# Patient Record
Sex: Female | Born: 1985 | Race: Black or African American | Hispanic: No | Marital: Married | Smoking: Current some day smoker
Health system: Southern US, Community
[De-identification: ages and names within clinical notes are randomized; demographics above are authoritative.]

## PROBLEM LIST (undated history)

## (undated) ENCOUNTER — Emergency Department (HOSPITAL_COMMUNITY): Payer: MEDICAID | Source: Home / Self Care

## (undated) ENCOUNTER — Inpatient Hospital Stay (HOSPITAL_COMMUNITY): Payer: Self-pay

## (undated) DIAGNOSIS — D219 Benign neoplasm of connective and other soft tissue, unspecified: Secondary | ICD-10-CM

## (undated) DIAGNOSIS — J45909 Unspecified asthma, uncomplicated: Secondary | ICD-10-CM

## (undated) DIAGNOSIS — H9190 Unspecified hearing loss, unspecified ear: Secondary | ICD-10-CM

## (undated) DIAGNOSIS — I10 Essential (primary) hypertension: Secondary | ICD-10-CM

## (undated) DIAGNOSIS — E119 Type 2 diabetes mellitus without complications: Secondary | ICD-10-CM

## (undated) DIAGNOSIS — A491 Streptococcal infection, unspecified site: Secondary | ICD-10-CM

## (undated) DIAGNOSIS — B009 Herpesviral infection, unspecified: Secondary | ICD-10-CM

## (undated) DIAGNOSIS — A5901 Trichomonal vulvovaginitis: Secondary | ICD-10-CM

## (undated) HISTORY — PX: DILATION AND CURETTAGE OF UTERUS: SHX78

## (undated) HISTORY — DX: Essential (primary) hypertension: I10

## (undated) HISTORY — DX: Streptococcal infection, unspecified site: A49.1

## (undated) HISTORY — DX: Herpesviral infection, unspecified: B00.9

## (undated) HISTORY — PX: WISDOM TOOTH EXTRACTION: SHX21

---

## 2012-04-28 DIAGNOSIS — Z6841 Body Mass Index (BMI) 40.0 and over, adult: Secondary | ICD-10-CM | POA: Insufficient documentation

## 2013-02-26 DIAGNOSIS — O021 Missed abortion: Secondary | ICD-10-CM | POA: Insufficient documentation

## 2014-02-12 ENCOUNTER — Emergency Department (HOSPITAL_COMMUNITY)
Admission: EM | Admit: 2014-02-12 | Discharge: 2014-02-12 | Disposition: A | Discharge status: Home / Self Care | Payer: Self-pay | Attending: Emergency Medicine | Admitting: Emergency Medicine

## 2014-02-12 ENCOUNTER — Encounter (HOSPITAL_COMMUNITY): Payer: Self-pay | Admitting: Emergency Medicine

## 2014-02-12 ENCOUNTER — Emergency Department (HOSPITAL_COMMUNITY): Payer: Self-pay

## 2014-02-12 DIAGNOSIS — J45909 Unspecified asthma, uncomplicated: Secondary | ICD-10-CM | POA: Insufficient documentation

## 2014-02-12 DIAGNOSIS — M722 Plantar fascial fibromatosis: Secondary | ICD-10-CM | POA: Insufficient documentation

## 2014-02-12 DIAGNOSIS — F17200 Nicotine dependence, unspecified, uncomplicated: Secondary | ICD-10-CM | POA: Insufficient documentation

## 2014-02-12 HISTORY — DX: Unspecified asthma, uncomplicated: J45.909

## 2014-02-12 MED ORDER — IBUPROFEN 800 MG PO TABS: 800.0000 mg | ORAL_TABLET | Freq: Three times a day (TID) | ORAL | Status: DC

## 2014-02-12 NOTE — ED Notes (Signed)
Pt reports "twisting" left foot 2 months ago and reports pain with ambulation ever since. States she has "constant throbbing" to foot. Denies numbness/tingling. Sensation intact. NAD.

## 2014-02-12 NOTE — Discharge Instructions (Signed)
Plantar Fasciitis (Heel Spur Syndrome) with Rehab The plantar fascia is a fibrous, ligament-like, soft-tissue structure that spans the bottom of the foot. Plantar fasciitis is a condition that causes pain in the foot due to inflammation of the tissue. SYMPTOMS   Pain and tenderness on the underneath side of the foot.  Pain that worsens with standing or walking. CAUSES  Plantar fasciitis is caused by irritation and injury to the plantar fascia on the underneath side of the foot. Common mechanisms of injury include:  Direct trauma to bottom of the foot.  Damage to a small nerve that runs under the foot where the main fascia attaches to the heel bone.  Stress placed on the plantar fascia due to bone spurs. RISK INCREASES WITH:   Activities that place stress on the plantar fascia (running, jumping, pivoting, or cutting).  Poor strength and flexibility.  Improperly fitted shoes.  Tight calf muscles.  Flat feet.  Failure to warm-up properly before activity.  Obesity. PREVENTION  Warm up and stretch properly before activity.  Allow for adequate recovery between workouts.  Maintain physical fitness:  Strength, flexibility, and endurance.  Cardiovascular fitness.  Maintain a health body weight.  Avoid stress on the plantar fascia.  Wear properly fitted shoes, including arch supports for individuals who have flat feet. PROGNOSIS  If treated properly, then the symptoms of plantar fasciitis usually resolve without surgery. However, occasionally surgery is necessary. RELATED COMPLICATIONS   Recurrent symptoms that may result in a chronic condition.  Problems of the lower back that are caused by compensating for the injury, such as limping.  Pain or weakness of the foot during push-off following surgery.  Chronic inflammation, scarring, and partial or complete fascia tear, occurring more often from repeated injections. TREATMENT  Treatment initially involves the use of  ice and medication to help reduce pain and inflammation. The use of strengthening and stretching exercises may help reduce pain with activity, especially stretches of the Achilles tendon. These exercises may be performed at home or with a therapist. Your caregiver may recommend that you use heel cups of arch supports to help reduce stress on the plantar fascia. Occasionally, corticosteroid injections are given to reduce inflammation. If symptoms persist for greater than 6 months despite non-surgical (conservative), then surgery may be recommended.  MEDICATION   If pain medication is necessary, then nonsteroidal anti-inflammatory medications, such as aspirin and ibuprofen, or other minor pain relievers, such as acetaminophen, are often recommended.  Do not take pain medication within 7 days before surgery.  Prescription pain relievers may be given if deemed necessary by your caregiver. Use only as directed and only as much as you need.  Corticosteroid injections may be given by your caregiver. These injections should be reserved for the most serious cases, because they may only be given a certain number of times. HEAT AND COLD  Cold treatment (icing) relieves pain and reduces inflammation. Cold treatment should be applied for 10 to 15 minutes every 2 to 3 hours for inflammation and pain and immediately after any activity that aggravates your symptoms. Use ice packs or massage the area with a piece of ice (ice massage).  Heat treatment may be used prior to performing the stretching and strengthening activities prescribed by your caregiver, physical therapist, or athletic trainer. Use a heat pack or soak the injury in warm water. SEEK IMMEDIATE MEDICAL CARE IF:  Treatment seems to offer no benefit, or the condition worsens.  Any medications produce adverse side effects. EXERCISES RANGE   OF MOTION (ROM) AND STRETCHING EXERCISES - Plantar Fasciitis (Heel Spur Syndrome) These exercises may help you  when beginning to rehabilitate your injury. Your symptoms may resolve with or without further involvement from your physician, physical therapist or athletic trainer. While completing these exercises, remember:   Restoring tissue flexibility helps normal motion to return to the joints. This allows healthier, less painful movement and activity.  An effective stretch should be held for at least 30 seconds.  A stretch should never be painful. You should only feel a gentle lengthening or release in the stretched tissue. RANGE OF MOTION - Toe Extension, Flexion  Sit with your right / left leg crossed over your opposite knee.  Grasp your toes and gently pull them back toward the top of your foot. You should feel a stretch on the bottom of your toes and/or foot.  Hold this stretch for __________ seconds.  Now, gently pull your toes toward the bottom of your foot. You should feel a stretch on the top of your toes and or foot.  Hold this stretch for __________ seconds. Repeat __________ times. Complete this stretch __________ times per day.  RANGE OF MOTION - Ankle Dorsiflexion, Active Assisted  Remove shoes and sit on a chair that is preferably not on a carpeted surface.  Place right / left foot under knee. Extend your opposite leg for support.  Keeping your heel down, slide your right / left foot back toward the chair until you feel a stretch at your ankle or calf. If you do not feel a stretch, slide your bottom forward to the edge of the chair, while still keeping your heel down.  Hold this stretch for __________ seconds. Repeat __________ times. Complete this stretch __________ times per day.  STRETCH - Gastroc, Standing  Place hands on wall.  Extend right / left leg, keeping the front knee somewhat bent.  Slightly point your toes inward on your back foot.  Keeping your right / left heel on the floor and your knee straight, shift your weight toward the wall, not allowing your back to  arch.  You should feel a gentle stretch in the right / left calf. Hold this position for __________ seconds. Repeat __________ times. Complete this stretch __________ times per day. STRETCH - Soleus, Standing  Place hands on wall.  Extend right / left leg, keeping the other knee somewhat bent.  Slightly point your toes inward on your back foot.  Keep your right / left heel on the floor, bend your back knee, and slightly shift your weight over the back leg so that you feel a gentle stretch deep in your back calf.  Hold this position for __________ seconds. Repeat __________ times. Complete this stretch __________ times per day. STRETCH - Gastrocsoleus, Standing  Note: This exercise can place a lot of stress on your foot and ankle. Please complete this exercise only if specifically instructed by your caregiver.   Place the ball of your right / left foot on a step, keeping your other foot firmly on the same step.  Hold on to the wall or a rail for balance.  Slowly lift your other foot, allowing your body weight to press your heel down over the edge of the step.  You should feel a stretch in your right / left calf.  Hold this position for __________ seconds.  Repeat this exercise with a slight bend in your right / left knee. Repeat __________ times. Complete this stretch __________ times per day.    STRENGTHENING EXERCISES - Plantar Fasciitis (Heel Spur Syndrome)  These exercises may help you when beginning to rehabilitate your injury. They may resolve your symptoms with or without further involvement from your physician, physical therapist or athletic trainer. While completing these exercises, remember:   Muscles can gain both the endurance and the strength needed for everyday activities through controlled exercises.  Complete these exercises as instructed by your physician, physical therapist or athletic trainer. Progress the resistance and repetitions only as guided. STRENGTH -  Towel Curls  Sit in a chair positioned on a non-carpeted surface.  Place your foot on a towel, keeping your heel on the floor.  Pull the towel toward your heel by only curling your toes. Keep your heel on the floor.  If instructed by your physician, physical therapist or athletic trainer, add ____________________ at the end of the towel. Repeat __________ times. Complete this exercise __________ times per day. STRENGTH - Ankle Inversion  Secure one end of a rubber exercise band/tubing to a fixed object (table, pole). Loop the other end around your foot just before your toes.  Place your fists between your knees. This will focus your strengthening at your ankle.  Slowly, pull your big toe up and in, making sure the band/tubing is positioned to resist the entire motion.  Hold this position for __________ seconds.  Have your muscles resist the band/tubing as it slowly pulls your foot back to the starting position. Repeat __________ times. Complete this exercises __________ times per day.  Document Released: 07/20/2005 Document Revised: 10/12/2011 Document Reviewed: 11/01/2008 [employer] Patient Information 2015 [employer]. This information is not intended to replace advice given to you by your health care provider. Make sure you discuss any questions you have with your health care provider.  

## 2014-02-12 NOTE — ED Provider Notes (Signed)
CSN: [id number]     Arrival date & time 02/12/14  1610 History  This chart was scribed for non-physician practitioner, Montine Circle, PA-C working with Varney Biles, MD by Frederich Balding, ED scribe. This patient was seen in room TR07C/TR07C and the patient's care was started at 5:54 PM.   Chief Complaint  Patient presents with  . Foot Pain   The history is provided by the patient. No language interpreter was used.   HPI Comments: [name] is a 28 y.o. female who presents to the Emergency Department complaining of continued, constant throbbing left foot pain after injuring it 2 months ago. States she think she twisted while working. Bearing weight worsens the pain. States she has been wearing sandals a lot recently. Pt has taken tylenol, done warm soaks and massages with no relief.   Past Medical History  Diagnosis Date  . Asthma    History reviewed. No pertinent past surgical history. No family history on file. History  Substance Use Topics  . Smoking status: Current Some Day Smoker    Types: Cigarettes  . Smokeless tobacco: Not on file  . Alcohol Use: No   OB History   Grav Para Term Preterm Abortions TAB SAB Ect Mult Living            1     Review of Systems  Constitutional: Negative for fever.  HENT: Negative for congestion.   Eyes: Negative for redness.  Respiratory: Negative for shortness of breath.   Cardiovascular: Negative for chest pain.  Gastrointestinal: Negative for abdominal distention.  Musculoskeletal: Positive for arthralgias.  Skin: Negative for rash.  Neurological: Negative for speech difficulty.  Psychiatric/Behavioral: Negative for confusion.   Allergies  Review of patient's allergies indicates no known allergies.  Home Medications   Prior to Admission medications   Not on File   BP 118/72  Pulse 84  Temp(Src) 98.1 F (36.7 C) (Oral)  Resp 20  Wt 263 lb 9 oz (119.551 kg)  SpO2 98%  LMP 02/05/2014  Physical Exam  Nursing note  and vitals reviewed. Constitutional: She is oriented to person, place, and time. She appears well-developed and well-nourished. No distress.  HENT:  Head: Normocephalic and atraumatic.  Eyes: Conjunctivae and EOM are normal.  Cardiovascular: Normal rate and regular rhythm.   Pulmonary/Chest: Effort normal and breath sounds normal. No stridor. No respiratory distress.  Abdominal: She exhibits no distension.  Musculoskeletal: She exhibits no edema.  Left foot tender to palpation along the plantar fascia. No bony abnormality or deformity. ROM and strength 5/5.  Neurological: She is alert and oriented to person, place, and time. No cranial nerve deficit.  Skin: Skin is warm and dry.  Psychiatric: She has a normal mood and affect.    ED Course  Procedures (including critical care time)  DIAGNOSTIC STUDIES: Oxygen Saturation is 98% on RA, normal by my interpretation.    COORDINATION OF CARE: 5:57 PM-Discussed treatment plan which includes ibuprofen, better arch support and rolling a frozen water bottle with her foot with pt at bedside and pt agreed to plan. Will give pt orthopedic referral and advised her to follow up if symptoms do not started resolving in 2 weeks.   Labs Review Labs Reviewed - No data to display  Imaging Review Dg Foot Complete Left  02/12/2014   CLINICAL DATA:  Left foot pain.  Left ankle pain.  EXAM: LEFT FOOT - COMPLETE 3+ VIEW  COMPARISON:  None.  FINDINGS: Midshaft convexity in the proximal phalanges  of the third and fourth toes is likely incidental or due to old deformity. No significant associated periosteal reaction or other signs that this represents stress fracture.  Lisfranc joint alignment unremarkable. First digit sesamoids normal.  Small Achilles calcaneal spur. The ankle is dorsal flexed during imaging.  IMPRESSION: 1. No acute findings.   Electronically Signed   By: Sherryl Barters M.D.   On: 02/12/2014 17:31     EKG Interpretation None      MDM    Final diagnoses:  Plantar fasciitis     I personally performed the services described in this documentation, which was scribed in my presence. The recorded information has been reviewed and is accurate.  Montine Circle, PA-C 02/12/14 [phone]

## 2014-02-12 NOTE — ED Notes (Signed)
PT reports ankle pain since march 2015. Pt feels the ankle bone is broken.

## 2014-02-14 NOTE — ED Provider Notes (Signed)
Medical screening examination/treatment/procedure(s) were performed by non-physician practitioner and as supervising physician I was immediately available for consultation/collaboration.   EKG Interpretation None       Varney Biles, MD 02/14/14 1232

## 2014-06-05 ENCOUNTER — Encounter (HOSPITAL_COMMUNITY): Payer: Self-pay | Admitting: Physical Medicine and Rehabilitation

## 2014-06-05 ENCOUNTER — Emergency Department (HOSPITAL_COMMUNITY)
Admission: EM | Admit: 2014-06-05 | Discharge: 2014-06-05 | Disposition: A | Discharge status: Home / Self Care | Payer: Self-pay | Attending: Emergency Medicine | Admitting: Emergency Medicine

## 2014-06-05 DIAGNOSIS — Z791 Long term (current) use of non-steroidal anti-inflammatories (NSAID): Secondary | ICD-10-CM | POA: Insufficient documentation

## 2014-06-05 DIAGNOSIS — J45909 Unspecified asthma, uncomplicated: Secondary | ICD-10-CM | POA: Insufficient documentation

## 2014-06-05 DIAGNOSIS — K0889 Other specified disorders of teeth and supporting structures: Secondary | ICD-10-CM

## 2014-06-05 DIAGNOSIS — Z720 Tobacco use: Secondary | ICD-10-CM | POA: Insufficient documentation

## 2014-06-05 DIAGNOSIS — K088 Other specified disorders of teeth and supporting structures: Secondary | ICD-10-CM | POA: Insufficient documentation

## 2014-06-05 MED ORDER — HYDROCODONE-ACETAMINOPHEN 5-325 MG PO TABS: 1.0000 | ORAL_TABLET | ORAL | Status: DC | PRN

## 2014-06-05 MED ORDER — PENICILLIN V POTASSIUM 500 MG PO TABS: 500.0000 mg | ORAL_TABLET | Freq: Three times a day (TID) | ORAL | Status: DC

## 2014-06-05 NOTE — ED Notes (Signed)
Pt presents to department for evaluation of dental pain. States pain to bottom wisdom teeth. Ongoing for several days. No relief with medications at home. 9/10 pain upon arrival to ED. NAD.

## 2014-06-05 NOTE — Discharge Instructions (Signed)

## 2014-06-05 NOTE — ED Provider Notes (Signed)
CSN: [id number]     Arrival date & time 06/05/14  1624 History  This chart was scribed for non-physician practitioner, Charlann Lange, PA-C, working with Hoy Morn, MD, by Delphia Grates, ED Scribe. This patient was seen in room TR06C/TR06C and the patient's care was started at 5:04 PM.    Chief Complaint  Patient presents with  . Dental Pain    Patient is a 28 y.o. female presenting with tooth pain. The history is provided by the patient. No language interpreter was used.  Dental Pain Location:  Lower Duration:  3 days Timing:  Constant Progression:  Unchanged Chronicity:  New Relieved by:  Nothing Ineffective treatments:  Topical anesthetic gel and NSAIDs Associated symptoms: no facial swelling and no fever      HPI Comments: [name] is a 28 y.o. female who presents to the Emergency Department complaining of constant, bilateral lower dental pain that has been ongoing for 3 days. Patient suspects her wisdom teeth are beginning to erupt and believes this to be the cause of her pain. She states she has been unable to eat due to pain. Patient has tried Advil and Orajel without significant relief. She denies facial swelling or fever. Patient is an occasional smoker and not established with a dentist.   Past Medical History  Diagnosis Date  . Asthma    History reviewed. No pertinent past surgical history. No family history on file. History  Substance Use Topics  . Smoking status: Current Some Day Smoker    Types: Cigarettes  . Smokeless tobacco: Not on file  . Alcohol Use: No   OB History    Gravida Para Term Preterm AB TAB SAB Ectopic Multiple Living            1     Review of Systems  Constitutional: Negative for fever.  HENT: Positive for dental problem. Negative for facial swelling.       Allergies  Review of patient's allergies indicates no known allergies.  Home Medications   Prior to Admission medications   Medication Sig Start Date End Date  Taking? Authorizing Provider  ibuprofen (ADVIL,MOTRIN) 800 MG tablet Take 1 tablet (800 mg total) by mouth 3 (three) times daily. 02/12/14   Montine Circle, PA-C   Triage Vitals: BP 138/81 mmHg  Pulse 87  Temp(Src) 99.2 F (37.3 C) (Oral)  Resp 18  Ht 5\' 8"  (1.727 m)  Wt 274 lb (124.286 kg)  BMI 41.67 kg/m2  SpO2 100%  Physical Exam  Constitutional: She is oriented to person, place, and time. She appears well-developed and well-nourished. No distress.  HENT:  Head: Normocephalic and atraumatic.  Generally good dentition with partially erupted rear lower molars. No pointing abscess. Mild right facial swelling.  Eyes: Conjunctivae and EOM are normal.  Neck: No tracheal deviation present.  No palpable cervical adenopathy.  Cardiovascular: Normal rate.   Pulmonary/Chest: Effort normal. No respiratory distress.  Musculoskeletal: Normal range of motion.  Lymphadenopathy:    She has no cervical adenopathy.  Neurological: She is alert and oriented to person, place, and time.  Skin: Skin is warm and dry.  Psychiatric: She has a normal mood and affect. Her behavior is normal.  Nursing note and vitals reviewed.   ED Course  Procedures (including critical care time)  DIAGNOSTIC STUDIES: Oxygen Saturation is 100% on room air, normal by my interpretation.    COORDINATION OF CARE: At 73 Discussed treatment plan with patient which includes ABX and dental referral. Patient agrees.  Labs Review Labs Reviewed - No data to display  Imaging Review No results found.   EKG Interpretation None      MDM   Final diagnoses:  None    1. Dental pain  Recommended dental follow up. No visualized abscess amenable to draining today. Stable for discharge.  I personally performed the services described in this documentation, which was scribed in my presence. The recorded information has been reviewed and is accurate.     Dewaine Oats, PA-C 06/05/14 1753

## 2014-06-21 ENCOUNTER — Emergency Department (HOSPITAL_COMMUNITY): Payer: Self-pay

## 2014-06-21 ENCOUNTER — Encounter (HOSPITAL_COMMUNITY): Payer: Self-pay

## 2014-06-21 ENCOUNTER — Emergency Department (HOSPITAL_COMMUNITY)
Admission: EM | Admit: 2014-06-21 | Discharge: 2014-06-22 | Disposition: A | Discharge status: Home / Self Care | Payer: Self-pay | Attending: Emergency Medicine | Admitting: Emergency Medicine

## 2014-06-21 DIAGNOSIS — N8320 Unspecified ovarian cysts: Secondary | ICD-10-CM | POA: Insufficient documentation

## 2014-06-21 DIAGNOSIS — R102 Pelvic and perineal pain: Secondary | ICD-10-CM

## 2014-06-21 DIAGNOSIS — N83202 Unspecified ovarian cyst, left side: Secondary | ICD-10-CM

## 2014-06-21 DIAGNOSIS — Z3202 Encounter for pregnancy test, result negative: Secondary | ICD-10-CM | POA: Insufficient documentation

## 2014-06-21 DIAGNOSIS — N760 Acute vaginitis: Secondary | ICD-10-CM | POA: Insufficient documentation

## 2014-06-21 DIAGNOSIS — N83201 Unspecified ovarian cyst, right side: Secondary | ICD-10-CM

## 2014-06-21 DIAGNOSIS — D259 Leiomyoma of uterus, unspecified: Secondary | ICD-10-CM | POA: Insufficient documentation

## 2014-06-21 DIAGNOSIS — Z87891 Personal history of nicotine dependence: Secondary | ICD-10-CM | POA: Insufficient documentation

## 2014-06-21 DIAGNOSIS — B9689 Other specified bacterial agents as the cause of diseases classified elsewhere: Secondary | ICD-10-CM

## 2014-06-21 DIAGNOSIS — Z792 Long term (current) use of antibiotics: Secondary | ICD-10-CM | POA: Insufficient documentation

## 2014-06-21 DIAGNOSIS — J45909 Unspecified asthma, uncomplicated: Secondary | ICD-10-CM | POA: Insufficient documentation

## 2014-06-21 LAB — CBC WITH DIFFERENTIAL/PLATELET
Basophils Absolute: 0 10*3/uL (ref 0.0–0.1)
Basophils Relative: 0 % (ref 0–1)
Eosinophils Absolute: 0.1 10*3/uL (ref 0.0–0.7)
Eosinophils Relative: 1 % (ref 0–5)
HCT: 31.2 % — ABNORMAL LOW (ref 36.0–46.0)
Hemoglobin: 10.2 g/dL — ABNORMAL LOW (ref 12.0–15.0)
Lymphocytes Relative: 40 % (ref 12–46)
Lymphs Abs: 2.4 10*3/uL (ref 0.7–4.0)
MCH: 23.7 pg — ABNORMAL LOW (ref 26.0–34.0)
MCHC: 32.7 g/dL (ref 30.0–36.0)
MCV: 72.4 fL — ABNORMAL LOW (ref 78.0–100.0)
Monocytes Absolute: 0.3 10*3/uL (ref 0.1–1.0)
Monocytes Relative: 5 % (ref 3–12)
Neutro Abs: 3.3 10*3/uL (ref 1.7–7.7)
Neutrophils Relative %: 54 % (ref 43–77)
Platelets: 321 10*3/uL (ref 150–400)
RBC: 4.31 MIL/uL (ref 3.87–5.11)
RDW: 14.9 % (ref 11.5–15.5)
WBC: 6.1 10*3/uL (ref 4.0–10.5)

## 2014-06-21 LAB — COMPREHENSIVE METABOLIC PANEL
ALT: 11 U/L (ref 0–35)
AST: 15 U/L (ref 0–37)
Albumin: 3.9 g/dL (ref 3.5–5.2)
Alkaline Phosphatase: 92 U/L (ref 39–117)
Anion gap: 13 (ref 5–15)
BUN: 6 mg/dL (ref 6–23)
CO2: 24 mEq/L (ref 19–32)
Calcium: 9.1 mg/dL (ref 8.4–10.5)
Chloride: 100 mEq/L (ref 96–112)
Creatinine, Ser: 0.79 mg/dL (ref 0.50–1.10)
GFR calc Af Amer: >90 mL/min (ref >90)
GFR calc non Af Amer: >90 mL/min (ref >90)
Glucose, Bld: 89 mg/dL (ref 70–99)
Potassium: 4.2 mEq/L (ref 3.7–5.3)
Sodium: 137 mEq/L (ref 137–147)
Total Bilirubin: <0.2 mg/dL — ABNORMAL LOW (ref 0.3–1.2)
Total Protein: 7.4 g/dL (ref 6.0–8.3)

## 2014-06-21 LAB — URINALYSIS, ROUTINE W REFLEX MICROSCOPIC
Bilirubin Urine: NEGATIVE
Glucose, UA: NEGATIVE mg/dL
Hgb urine dipstick: NEGATIVE
Ketones, ur: NEGATIVE mg/dL
Leukocytes, UA: NEGATIVE
Nitrite: NEGATIVE
Protein, ur: NEGATIVE mg/dL
Specific Gravity, Urine: 1.008 (ref 1.005–1.030)
Urobilinogen, UA: 0.2 mg/dL (ref 0.0–1.0)
pH: 5.5 (ref 5.0–8.0)

## 2014-06-21 LAB — WET PREP, GENITAL
Trich, Wet Prep: NONE SEEN
Yeast Wet Prep HPF POC: NONE SEEN

## 2014-06-21 LAB — PREGNANCY, URINE: Preg Test, Ur: NEGATIVE

## 2014-06-21 NOTE — ED Notes (Signed)
Pelvic cart set up, pt undressed from waist down.

## 2014-06-21 NOTE — ED Notes (Signed)
Pt c/o "pain in ovaries" - denies any difficulty urinating, N/V/D. Pt states pain alternates sides.

## 2014-06-21 NOTE — ED Notes (Signed)
Pt remains in ultrasound.

## 2014-06-21 NOTE — ED Notes (Signed)
Pt going to ultrasound with a nursing assistant at this time.

## 2014-06-21 NOTE — ED Provider Notes (Signed)
CSN: [id number]     Arrival date & time 06/21/14  1642 History   First MD Initiated Contact with Patient 06/21/14 1957     Chief Complaint  Patient presents with  . Abdominal Pain     (Consider location/radiation/quality/duration/timing/severity/associated sxs/prior Treatment) The history is provided by the patient. No language interpreter was used.  [name] is a 28 y/o F with PMHx of asthma presenting to the ED with pelvic pain that has been ongoing intermittently for the past couple of months, starting up in May of 2015 - reported that she thinks it is her ovaries. Patient reported that the pain is localized to the pelvic region bilaterally described as a throbbing, aching pain that is intermittent - reported that it is episodic lasting for a couple of days - reported that at times she has intermittent sharp pain. Stated that at times the pain radiates to her lower back. Patient reported that she has been using ASA, Tylenol, Ibuprofen with minimal relief. Reported that for the past few days she has noticed vaginal discharge of a thick clear consistency. Reported that she is sexually active, does not use protection - with one partner x 2 years. Patient reported that her LMP was 06/05/2014. Denied fever, chills, vomiting, diarrhea, melena, hematochezia, dysuria, hematuria, vaginal pain, neck pain, neck stiffness. Denied being on birth control. PCP [facility]  Past Medical History  Diagnosis Date  . Asthma    History reviewed. No pertinent past surgical history. No family history on file. History  Substance Use Topics  . Smoking status: Former Smoker    Types: Cigarettes  . Smokeless tobacco: Not on file  . Alcohol Use: No   OB History    Gravida Para Term Preterm AB TAB SAB Ectopic Multiple Living            1     Review of Systems  Constitutional: Negative for fever and chills.  Respiratory: Negative for chest tightness and shortness of breath.    Cardiovascular: Negative for chest pain.  Gastrointestinal: Positive for nausea. Negative for vomiting, abdominal pain, diarrhea, constipation, blood in stool and anal bleeding.  Genitourinary: Positive for vaginal discharge and pelvic pain. Negative for dysuria, hematuria, decreased urine volume, vaginal bleeding and vaginal pain.  Musculoskeletal: Positive for back pain. Negative for neck pain and neck stiffness.  Neurological: Negative for dizziness, weakness, numbness and headaches.      Allergies  Review of patient's allergies indicates no known allergies.  Home Medications   Prior to Admission medications   Medication Sig Start Date End Date Taking? Authorizing Provider  HYDROcodone-acetaminophen (NORCO/VICODIN) 5-325 MG per tablet Take 1-2 tablets by mouth every 4 (four) hours as needed. 06/05/14  Yes Shari A Upstill, PA-C  penicillin v potassium (VEETID) 500 MG tablet Take 1 tablet (500 mg total) by mouth 3 (three) times daily. 06/05/14  Yes Shari A Upstill, PA-C  ibuprofen (ADVIL,MOTRIN) 400 MG tablet Take 1 tablet (400 mg total) by mouth every 6 (six) hours as needed. 06/22/14   Warren Kugelman, PA-C  metroNIDAZOLE (FLAGYL) 500 MG tablet Take 1 tablet (500 mg total) by mouth 2 (two) times daily. 06/22/14   Delani Kohli, PA-C   BP 113/63 mmHg  Pulse 75  Temp(Src) 98.3 F (36.8 C) (Oral)  Resp 20  Ht 5\' 8"  (1.727 m)  Wt 274 lb (124.286 kg)  BMI 41.67 kg/m2  SpO2 100%  LMP 06/05/2014 Physical Exam  Constitutional: She is oriented to person, place, and time.  She appears well-developed and well-nourished. No distress.  HENT:  Head: Normocephalic and atraumatic.  Mouth/Throat: Oropharynx is clear and moist. No oropharyngeal exudate.  Eyes: Conjunctivae are normal. Pupils are equal, round, and reactive to light. Right eye exhibits no discharge. Left eye exhibits no discharge.  Neck: Normal range of motion. Neck supple. No tracheal deviation present.  Cardiovascular: Normal  rate, regular rhythm and normal heart sounds.  Exam reveals no friction rub.   No murmur heard. Pulmonary/Chest: Effort normal and breath sounds normal. No respiratory distress. She has no wheezes. She has no rales.  Abdominal: Soft. Bowel sounds are normal. She exhibits no distension. There is no tenderness. There is no rebound and no guarding.  Obese BS normoactive in all 4 quadrants Abdomen soft upon palpation  Negative pain upon palpation  Negative peritoneal signs   Genitourinary: No labial fusion. There is no rash, tenderness, lesion or injury on the right labia. There is no rash, tenderness, lesion or injury on the left labia. Uterus is not deviated, not enlarged, not fixed and not tender. Cervix exhibits no motion tenderness and no friability. Right adnexum displays tenderness. Right adnexum displays no mass and no fullness. Left adnexum displays tenderness. Left adnexum displays no mass and no fullness. No erythema, tenderness or bleeding in the vagina. No foreign body around the vagina. No signs of injury around the vagina. Vaginal discharge found.  Pelvic exam: negative swelling, erythema, inflammation, lesions, sores, deformities noted to the external genitalia. Negative masses, polyps, lesions, sores noted to the vaginal canal. Negative blood in the vaginal vault. Thick white discharge with odor noted on exam. Negative CMT. Mild bilateral adnexal tenderness noted.  Exam chaperoned with nurse, Becki  Musculoskeletal: Normal range of motion.  Full ROM to upper and lower extremities without difficulty noted, negative ataxia noted.  Lymphadenopathy:    She has no cervical adenopathy.  Neurological: She is alert and oriented to person, place, and time. No cranial nerve deficit. She exhibits normal muscle tone. Coordination normal.  Skin: Skin is warm and dry. No rash noted. She is not diaphoretic. No erythema.  Psychiatric: She has a normal mood and affect. Her behavior is normal. Thought  content normal.  Nursing note and vitals reviewed.   ED Course  Procedures (including critical care time)  Results for orders placed or performed during the hospital encounter of 06/21/14  Wet prep, genital  Result Value Ref Range   Yeast Wet Prep HPF POC NONE SEEN NONE SEEN   Trich, Wet Prep NONE SEEN NONE SEEN   Clue Cells Wet Prep HPF POC FEW (A) NONE SEEN   WBC, Wet Prep HPF POC FEW (A) NONE SEEN  CBC with Differential  Result Value Ref Range   WBC 6.1 4.0 - 10.5 K/uL   RBC 4.31 3.87 - 5.11 MIL/uL   Hemoglobin 10.2 (L) 12.0 - 15.0 g/dL   HCT 31.2 (L) 36.0 - 46.0 %   MCV 72.4 (L) 78.0 - 100.0 fL   MCH 23.7 (L) 26.0 - 34.0 pg   MCHC 32.7 30.0 - 36.0 g/dL   RDW 14.9 11.5 - 15.5 %   Platelets 321 150 - 400 K/uL   Neutrophils Relative % 54 43 - 77 %   Neutro Abs 3.3 1.7 - 7.7 K/uL   Lymphocytes Relative 40 12 - 46 %   Lymphs Abs 2.4 0.7 - 4.0 K/uL   Monocytes Relative 5 3 - 12 %   Monocytes Absolute 0.3 0.1 - 1.0 K/uL  Eosinophils Relative 1 0 - 5 %   Eosinophils Absolute 0.1 0.0 - 0.7 K/uL   Basophils Relative 0 0 - 1 %   Basophils Absolute 0.0 0.0 - 0.1 K/uL  Comprehensive metabolic panel  Result Value Ref Range   Sodium 137 137 - 147 mEq/L   Potassium 4.2 3.7 - 5.3 mEq/L   Chloride 100 96 - 112 mEq/L   CO2 24 19 - 32 mEq/L   Glucose, Bld 89 70 - 99 mg/dL   BUN 6 6 - 23 mg/dL   Creatinine, Ser 0.79 0.50 - 1.10 mg/dL   Calcium 9.1 8.4 - 10.5 mg/dL   Total Protein 7.4 6.0 - 8.3 g/dL   Albumin 3.9 3.5 - 5.2 g/dL   AST 15 0 - 37 U/L   ALT 11 0 - 35 U/L   Alkaline Phosphatase 92 39 - 117 U/L   Total Bilirubin <0.2 (L) 0.3 - 1.2 mg/dL   GFR calc non Af Amer >90 >90 mL/min   GFR calc Af Amer >90 >90 mL/min   Anion gap 13 5 - 15  Pregnancy, urine  Result Value Ref Range   Preg Test, Ur NEGATIVE NEGATIVE  Urinalysis, Routine w reflex microscopic  Result Value Ref Range   Color, Urine YELLOW YELLOW   APPearance CLEAR CLEAR   Specific Gravity, Urine 1.008 1.005 -  1.030   pH 5.5 5.0 - 8.0   Glucose, UA NEGATIVE NEGATIVE mg/dL   Hgb urine dipstick NEGATIVE NEGATIVE   Bilirubin Urine NEGATIVE NEGATIVE   Ketones, ur NEGATIVE NEGATIVE mg/dL   Protein, ur NEGATIVE NEGATIVE mg/dL   Urobilinogen, UA 0.2 0.0 - 1.0 mg/dL   Nitrite NEGATIVE NEGATIVE   Leukocytes, UA NEGATIVE NEGATIVE    Labs Review Labs Reviewed  WET PREP, GENITAL - Abnormal; Notable for the following:    Clue Cells Wet Prep HPF POC FEW (*)    WBC, Wet Prep HPF POC FEW (*)    All other components within normal limits  CBC WITH DIFFERENTIAL - Abnormal; Notable for the following:    Hemoglobin 10.2 (*)    HCT 31.2 (*)    MCV 72.4 (*)    MCH 23.7 (*)    All other components within normal limits  COMPREHENSIVE METABOLIC PANEL - Abnormal; Notable for the following:    Total Bilirubin <0.2 (*)    All other components within normal limits  GC/CHLAMYDIA PROBE AMP  PREGNANCY, URINE  URINALYSIS, ROUTINE W REFLEX MICROSCOPIC  HIV ANTIBODY (ROUTINE TESTING)    Imaging Review US Transvaginal Non-ob  06/22/2014   CLINICAL DATA:  Pelvic pain  EXAM: TRANSABDOMINAL AND TRANSVAGINAL ULTRASOUND OF PELVIS  DOPPLER ULTRASOUND OF OVARIES  TECHNIQUE: Both transabdominal and transvaginal ultrasound examinations of the pelvis were performed. Transabdominal technique was performed for global imaging of the pelvis including uterus, ovaries, adnexal regions, and pelvic cul-de-sac.  It was necessary to proceed with endovaginal exam following the transabdominal exam to visualize the ovaries. Color and duplex Doppler ultrasound was utilized to evaluate blood flow to the ovaries.  COMPARISON:  None.  FINDINGS: Uterus  Measurements: 9.1 x 5.1 x 6.0 cm. Echogenic areas are noted within the myometrium consist with uterine fibroids. One measures 2.2 cm lying anteriorly. A second more posteriorly oriented measures 1.7 cm.  Endometrium  Thickness: Prominent at 14 mm. This may be related to the patient's menstrual status.   Right ovary  Measurements: 4.6 x 2.5 x 2.9 cm. Normal appearance/no adnexal mass.  Left ovary  Measurements:  4.2 x 2.8 x 2.9 cm. A 1.7 cm cystic structure is noted within the ovary with some internal debris likely representing hemorrhagic cyst. Follicular changes are noted  Pulsed Doppler evaluation of both ovaries demonstrates normal low-resistance arterial and venous waveforms.  Other findings  Small amount of free fluid is noted.  IMPRESSION: Cystic structure within the left ovary with some internal debris which may represent a hemorrhagic cyst or possibly an involuting cyst given the free fluid.  Uterine fibroids   Electronically Signed   By: Inez Catalina M.D.   On: 06/22/2014 00:26   US Pelvis Complete  06/22/2014   CLINICAL DATA:  Pelvic pain  EXAM: TRANSABDOMINAL AND TRANSVAGINAL ULTRASOUND OF PELVIS  DOPPLER ULTRASOUND OF OVARIES  TECHNIQUE: Both transabdominal and transvaginal ultrasound examinations of the pelvis were performed. Transabdominal technique was performed for global imaging of the pelvis including uterus, ovaries, adnexal regions, and pelvic cul-de-sac.  It was necessary to proceed with endovaginal exam following the transabdominal exam to visualize the ovaries. Color and duplex Doppler ultrasound was utilized to evaluate blood flow to the ovaries.  COMPARISON:  None.  FINDINGS: Uterus  Measurements: 9.1 x 5.1 x 6.0 cm. Echogenic areas are noted within the myometrium consist with uterine fibroids. One measures 2.2 cm lying anteriorly. A second more posteriorly oriented measures 1.7 cm.  Endometrium  Thickness: Prominent at 14 mm. This may be related to the patient's menstrual status.  Right ovary  Measurements: 4.6 x 2.5 x 2.9 cm. Normal appearance/no adnexal mass.  Left ovary  Measurements: 4.2 x 2.8 x 2.9 cm. A 1.7 cm cystic structure is noted within the ovary with some internal debris likely representing hemorrhagic cyst. Follicular changes are noted  Pulsed Doppler evaluation of both  ovaries demonstrates normal low-resistance arterial and venous waveforms.  Other findings  Small amount of free fluid is noted.  IMPRESSION: Cystic structure within the left ovary with some internal debris which may represent a hemorrhagic cyst or possibly an involuting cyst given the free fluid.  Uterine fibroids   Electronically Signed   By: Inez Catalina M.D.   On: 06/22/2014 00:26   [place] Art/ven Flow Abd Pelv Doppler  06/22/2014   CLINICAL DATA:  Pelvic pain  EXAM: TRANSABDOMINAL AND TRANSVAGINAL ULTRASOUND OF PELVIS  DOPPLER ULTRASOUND OF OVARIES  TECHNIQUE: Both transabdominal and transvaginal ultrasound examinations of the pelvis were performed. Transabdominal technique was performed for global imaging of the pelvis including uterus, ovaries, adnexal regions, and pelvic cul-de-sac.  It was necessary to proceed with endovaginal exam following the transabdominal exam to visualize the ovaries. Color and duplex Doppler ultrasound was utilized to evaluate blood flow to the ovaries.  COMPARISON:  None.  FINDINGS: Uterus  Measurements: 9.1 x 5.1 x 6.0 cm. Echogenic areas are noted within the myometrium consist with uterine fibroids. One measures 2.2 cm lying anteriorly. A second more posteriorly oriented measures 1.7 cm.  Endometrium  Thickness: Prominent at 14 mm. This may be related to the patient's menstrual status.  Right ovary  Measurements: 4.6 x 2.5 x 2.9 cm. Normal appearance/no adnexal mass.  Left ovary  Measurements: 4.2 x 2.8 x 2.9 cm. A 1.7 cm cystic structure is noted within the ovary with some internal debris likely representing hemorrhagic cyst. Follicular changes are noted  Pulsed Doppler evaluation of both ovaries demonstrates normal low-resistance arterial and venous waveforms.  Other findings  Small amount of free fluid is noted.  IMPRESSION: Cystic structure within the left ovary with some internal debris which  may represent a hemorrhagic cyst or possibly an involuting cyst given the free fluid.   Uterine fibroids   Electronically Signed   By: Inez Catalina M.D.   On: 06/22/2014 00:26     EKG Interpretation None      MDM   Final diagnoses:  Pelvic pain in female  Cysts of both ovaries  BV (bacterial vaginosis)  Uterine leiomyoma, unspecified location    Medications  azithromycin (ZITHROMAX) tablet 1,000 mg (1,000 mg Oral Given 06/22/14 0059)  cefTRIAXone (ROCEPHIN) injection 250 mg (250 mg Intramuscular Given 06/22/14 0059)  lidocaine (PF) (XYLOCAINE) 1 % injection 1 mL (1 mL Other Given 06/22/14 0059)    Filed Vitals:   06/21/14 2300 06/22/14 0013 06/22/14 0030 06/22/14 0101  BP: 114/77 105/58 96/60 113/63  Pulse:  85 84 75  Temp:      TempSrc:      Resp: 17 13 15 20   Height:      Weight:      SpO2:  99% 100% 100%   CBC unremarkable-negative elevated white blood cell count. Hemoglobin 10.2, hematocrit 31.2-no previous labs to compare the patient is anemic. CMP unremarkable. Urine pregnancy negative. Urinalysis negative nitrites, leukocytes. Negative hemoglobin or ketones noted in urine. Wet prep noted few clue cells with few white blood cells. GC Chlamydia probe and HIV pending. Pelvic ultrasound noted cystic structure within left ovary with some internal debris which may represent hemorrhagic cyst or possibly an involuting cyst given the free fluid-uterine fibroids identified. Negative findings of molar pregnancy or ectopic pregnancy. Doubt TOA. Doubt ovarian torsion. Not concerned for PID. Suspicion to be ovarian cysts, particularly on the left resulting in patient's pain. Patient has vaginal discharge with clue cells noted on wet prep - will treat patient for BV. Patient stable, afebrile. Patient not septic appearing. Discharge patient. Discharged patient with ibuprofen and Flagyl. Referred patient to PCP and OBGYN. Discussed with patient to rest and stay hydrated. Discussed with patient to avoid any sexual activity and for patient to follow-up regarding partner to be  tested. Discussed with patient safe sex habits. Discussed with patient to not use alcohol with Flagyl. Discussed with patient to closely monitor symptoms and if symptoms are to worsen or change to report back to the ED - strict return instructions given.  Patient agreed to plan of care, understood, all questions answered.   Jamse Mead, PA-C 06/22/14 0147  Wandra Arthurs, MD 06/22/14 [id number]

## 2014-06-21 NOTE — ED Notes (Signed)
Pt states she is here for lower bilateral abd pain since may. Reports clear discharge. No odd vaginal bleeding. Denies any urinary sx. Wants to know the cause of the pain. Reports nausea.

## 2014-06-22 LAB — HIV ANTIBODY (ROUTINE TESTING W REFLEX): HIV 1&2 Ab, 4th Generation: NONREACTIVE

## 2014-06-22 MED ORDER — METRONIDAZOLE 500 MG PO TABS: 500.0000 mg | ORAL_TABLET | Freq: Two times a day (BID) | ORAL | Status: DC

## 2014-06-22 MED ORDER — IBUPROFEN 400 MG PO TABS: 400.0000 mg | ORAL_TABLET | Freq: Four times a day (QID) | ORAL | Status: DC | PRN

## 2014-06-22 MED ORDER — CEFTRIAXONE SODIUM 250 MG IJ SOLR
250.0000 mg | Freq: Once | INTRAMUSCULAR | Status: AC
  Administered 2014-06-22: 250 mg via INTRAMUSCULAR
  Filled 2014-06-22: qty 250

## 2014-06-22 MED ORDER — LIDOCAINE HCL (PF) 1 % IJ SOLN
1.0000 mL | Freq: Once | INTRAMUSCULAR | Status: AC
  Administered 2014-06-22: 1 mL
  Filled 2014-06-22: qty 5

## 2014-06-22 MED ORDER — AZITHROMYCIN 250 MG PO TABS
1000.0000 mg | ORAL_TABLET | Freq: Once | ORAL | Status: AC
  Administered 2014-06-22: 1000 mg via ORAL
  Filled 2014-06-22: qty 4

## 2014-06-22 NOTE — Discharge Instructions (Signed)
Please call your doctor for a followup appointment within 24-48 hours. When you talk to your doctor please let them know that you were seen in the emergency department and have them acquire all of your records so that they can discuss the findings with you and formulate a treatment plan to fully care for your new and ongoing problems. Please call and set up an appointment with your primary care provider to be seen and reassessed Please call and set up an appointment with OB/GYN, [facility] to be seen and reassessed regarding ovarian cyst on the left ovary measuring approximately 1.7 cm today-this will need to be closely monitored. Please have repeat ultrasound for ovarian cyst and uterine fibroids to be monitored Please take medications as prescribed and on a full stomach There is to be no drinking alcohol with Flagyl Always practice safe sex habits such as using a condom Please have partner(s) over a six-month time period be reassessed and checked for any STDs Please continue to monitor symptoms closely and if symptoms are to worsen or change (fever greater than 101, chills, sweating, nausea, vomiting, chest pain, shortness of breathe, difficulty breathing, weakness, numbness, tingling, worsening or changes to pain pattern, changes to vaginal discharge, abdominal pain, blood in the stools, black tarry stools, worsening pain, inability to keep food or fluids down, abnormal vaginal bleeding) please report back to the Emergency Department immediately.   Uterine Fibroid A uterine fibroid is a growth (tumor) that occurs in your uterus. This type of tumor is not cancerous and does not spread out of the uterus. You can have one or many fibroids. Fibroids can vary in size, weight, and where they grow in the uterus. Some can become quite large. Most fibroids do not require medical treatment, but some can cause pain or heavy bleeding during and between periods. CAUSES  A fibroid is the result of a single  uterine cell that keeps growing (unregulated), which is different than most cells in the human body. Most cells have a control mechanism that keeps them from reproducing without control.  SIGNS AND SYMPTOMS   Bleeding.  Pelvic pain and pressure.  Bladder problems due to the size of the fibroid.  Infertility and miscarriages depending on the size and location of the fibroid. DIAGNOSIS  Uterine fibroids are diagnosed through a physical exam. Your health care provider may feel the lumpy tumors during a pelvic exam. Ultrasonography may be done to get information regarding size, location, and number of tumors.  TREATMENT   Your health care provider may recommend watchful waiting. This involves getting the fibroid checked by your health care provider to see if it grows or shrinks.   Hormone treatment or an intrauterine device (IUD) may be prescribed.   Surgery may be needed to remove the fibroids (myomectomy) or the uterus (hysterectomy). This depends on your situation. When fibroids interfere with fertility and a woman wants to become pregnant, a health care provider may recommend having the fibroids removed.  [place] care depends on how you were treated. In general:   Keep all follow-up appointments with your health care provider.   Only take over-the-counter or prescription medicines as directed by your health care provider. If you were prescribed a hormone treatment, take the hormone medicines exactly as directed. Do not take aspirin. It can cause bleeding.   Talk to your health care provider about taking iron pills.  If your periods are troublesome but not so heavy, lie down with your feet  raised slightly above your heart. Place cold packs on your lower abdomen.   If your periods are heavy, write down the number of pads or tampons you use per month. Bring this information to your health care provider.   Include green vegetables in your diet.  SEEK  IMMEDIATE MEDICAL CARE IF:  You have pelvic pain or cramps not controlled with medicines.   You have a sudden increase in pelvic pain.   You have an increase in bleeding between and during periods.   You have excessive periods and soak tampons or pads in a half hour or less.  You feel lightheaded or have fainting episodes. Document Released: 07/17/2000 Document Revised: 05/10/2013 Document Reviewed: 02/16/2013 [facility] Patient Information 2015 [place], [place]. This information is not intended to replace advice given to you by your health care provider. Make sure you discuss any questions you have with your health care provider. Ovarian Cyst An ovarian cyst is a fluid-filled sac that forms on an ovary. The ovaries are small organs that produce eggs in women. Various types of cysts can form on the ovaries. Most are not cancerous. Many do not cause problems, and they often go away on their own. Some may cause symptoms and require treatment. Common types of ovarian cysts include:  Functional cysts--These cysts may occur every month during the menstrual cycle. This is normal. The cysts usually go away with the next menstrual cycle if the woman does not get pregnant. Usually, there are no symptoms with a functional cyst.  Endometrioma cysts--These cysts form from the tissue that lines the uterus. They are also called "chocolate cysts" because they become filled with blood that turns brown. This type of cyst can cause pain in the lower abdomen during intercourse and with your menstrual period.  Cystadenoma cysts--This type develops from the cells on the outside of the ovary. These cysts can get very big and cause lower abdomen pain and pain with intercourse. This type of cyst can twist on itself, cut off its blood supply, and cause severe pain. It can also easily rupture and cause a lot of pain.  Dermoid cysts--This type of cyst is sometimes found in both ovaries. These cysts may contain different  kinds of body tissue, such as skin, teeth, hair, or cartilage. They usually do not cause symptoms unless they get very big.  Theca lutein cysts--These cysts occur when too much of a certain hormone (human chorionic gonadotropin) is produced and overstimulates the ovaries to produce an egg. This is most common after procedures used to assist with the conception of a baby (in vitro fertilization). CAUSES   Fertility drugs can cause a condition in which multiple large cysts are formed on the ovaries. This is called ovarian hyperstimulation syndrome.  A condition called polycystic ovary syndrome can cause hormonal imbalances that can lead to nonfunctional ovarian cysts. SIGNS AND SYMPTOMS  Many ovarian cysts do not cause symptoms. If symptoms are present, they may include:  Pelvic pain or pressure.  Pain in the lower abdomen.  Pain during sexual intercourse.  Increasing girth (swelling) of the abdomen.  Abnormal menstrual periods.  Increasing pain with menstrual periods.  Stopping having menstrual periods without being pregnant. DIAGNOSIS  These cysts are commonly found during a routine or annual pelvic exam. Tests may be ordered to find out more about the cyst. These tests may include:  Ultrasound.  X-ray of the pelvis.  CT scan.  MRI.  Blood tests. TREATMENT  Many ovarian cysts go away on  their own without treatment. Your health care provider may want to check your cyst regularly for 2-3 months to see if it changes. For women in menopause, it is particularly important to monitor a cyst closely because of the higher rate of ovarian cancer in menopausal women. When treatment is needed, it may include any of the following:  A procedure to drain the cyst (aspiration). This may be done using a long needle and ultrasound. It can also be done through a laparoscopic procedure. This involves using a thin, lighted tube with a tiny camera on the end (laparoscope) inserted through a small  incision.  Surgery to remove the whole cyst. This may be done using laparoscopic surgery or an open surgery involving a larger incision in the lower abdomen.  Hormone treatment or birth control pills. These methods are sometimes used to help dissolve a cyst. HOME CARE INSTRUCTIONS   Only take over-the-counter or prescription medicines as directed by your health care provider.  Follow up with your health care provider as directed.  Get regular pelvic exams and Pap tests. SEEK MEDICAL CARE IF:   Your periods are late, irregular, or painful, or they stop.  Your pelvic pain or abdominal pain does not go away.  Your abdomen becomes larger or swollen.  You have pressure on your bladder or trouble emptying your bladder completely.  You have pain during sexual intercourse.  You have feelings of fullness, pressure, or discomfort in your stomach.  You lose weight for no apparent reason.  You feel generally ill.  You become constipated.  You lose your appetite.  You develop acne.  You have an increase in body and facial hair.  You are gaining weight, without changing your exercise and eating habits.  You think you are pregnant. SEEK IMMEDIATE MEDICAL CARE IF:   You have increasing abdominal pain.  You feel sick to your stomach (nauseous), and you throw up (vomit).  You develop a fever that comes on suddenly.  You have abdominal pain during a bowel movement.  Your menstrual periods become heavier than usual. MAKE SURE YOU:  Understand these instructions.  Will watch your condition.  Will get help right away if you are not doing well or get worse. Document Released: 07/20/2005 Document Revised: 07/25/2013 Document Reviewed: 03/27/2013 [facility] Patient Information 2015 [place], [place]. This information is not intended to replace advice given to you by your health care provider. Make sure you discuss any questions you have with your health care provider.   Emergency  Department Resource Guide 1) Find a Doctor and Pay Out of Pocket Although you won't have to find out who is covered by your insurance plan, it is a good idea to ask around and get recommendations. You will then need to call the office and see if the doctor you have chosen will accept you as a new patient and what types of options they offer for patients who are self-pay. Some doctors offer discounts or will set up payment plans for their patients who do not have insurance, but you will need to ask so you aren't surprised when you get to your appointment.  2) Contact Your Local Health Department Not all health departments have doctors that can see patients for sick visits, but many do, so it is worth a call to see if yours does. If you don't know where your local health department is, you can check in your phone book. The CDC also has a tool to help you locate your state's  health department, and many state websites also have listings of all of their local health departments.  3) Find a [facility] If your illness is not likely to be very severe or complicated, you may want to try a walk in clinic. These are popping up all over the country in pharmacies, drugstores, and shopping centers. They're usually staffed by nurse practitioners or physician assistants that have been trained to treat common illnesses and complaints. They're usually fairly quick and inexpensive. However, if you have serious medical issues or chronic medical problems, these are probably not your best option.  No Primary Care Doctor: - Call Health Connect at  [phone] - they can help you locate a primary care doctor that  accepts your insurance, provides certain services, etc. - Physician Referral Service- [phone]  Chronic Pain Problems: Organization         Address  Phone   Notes  [facility]  [phone] Patients need to be referred by their primary care doctor.   Medication  Assistance: Organization         Address  Phone   Notes  [facility] Medication [facility] [address] [place], [place] [address] [phone] --Must be a resident of [facility] -- Must have NO insurance coverage whatsoever (no Medicaid/ Medicare, etc.) -- The pt. MUST have a primary care doctor that directs their care regularly and follows them in the community   [facility]ist  [phone]   [employer]  [phone]    Agencies that provide inexpensive medical care: Organization         Address  Phone   Notes  [place]  [phone]   [facility]    [phone]   [facility] [place], [place] [address] [phone]   [place] [address] [place] [phone]   Planned Parenthood    [phone]   [facility]    [phone]   [place] and [place] [address], [place] Phone:  [phone], Fax:  [phone] Hours of Operation:  9 am - 6 pm, M-F.  Also accepts Medicaid/Medicare and self-pay.  [facility] for [place], [address], [place] Phone: [phone], Fax: [phone]. Hours of Operation:  8:30 am - 5:30 pm, M-F.  Also accepts Medicaid and self-pay.  [facility] [address], [place] Phone: [phone]   [place], [place], [place] [phone], Ext. [phone] Mondays & Thursdays: 7-9 AM.  First 15 patients are seen on a first come, first serve basis.    [place] Providers:  Organization         Address  Phone   Notes  [facility] [address], [place] [phone] Also accepts self-pay patients.  [facility] [address] [place], [place]  [phone]   [place], [address], [place]  [phone]   [facility] [address], [place] [phone]   Lucianne Lei [address], [place]   [phone] Only accepts [place] Access [place] patients after they have their name applied to their card.   Self-Pay (no insurance) in [facility] [place] [facility]:  Organization  Address  Phone   Notes  Sickle Cell Patients, [facility] Internal [facility] [place] [phone]   [facility] Urgent Care [place] [phone]   [facility] [place]  [place], [address], [place] [phone]   [facility]/Dr. Osei-Bonsu  [address] [place] or [address], [place] [phone] Phone number for both [place] and [place] locations is the same.  Urgent Medical and [facility] [address], [place] [phone]   [facility] [address] [place] or [address] [phone]   [facility] [address], [place] [phone], phone; [phone], fax Sees patients 1st and 3rd Saturday of every month.  Must not qualify for public or private insurance (i.e. Medicaid, Medicare, [place] Health Choice, Veterans' Benefits)  Household income should be no more than 200% of the poverty level The clinic cannot treat you if you are pregnant or think you are pregnant  Sexually transmitted diseases are not treated at the clinic.    Dental Care: Organization         Address  Phone  Notes  [facility] Department of [facility] [phone] Accepts children up to age 35 who are enrolled in [place] or [place]; pregnant women with a Medicaid card; and children who have applied for Medicaid or [place] Health Choice, but were declined, whose parents can pay a reduced fee at time of service.  [facility]  Department of [facility]  [address], [place] [phone] Accepts children up to age 12 who are enrolled in [place] or [place]; pregnant women with a Medicaid card; and children who have applied for Medicaid or [place] Health Choice, but were declined, whose parents can pay a reduced fee at time of service.  [place] Adult Dental Access PROGRAM  [place] [phone] Patients are seen by appointment only. Walk-ins are not accepted. [place] will see patients 30 years of age and older. Monday - Tuesday (8am-5pm) Most Wednesdays (8:30-5pm) $30 per visit, cash only  [facility] Adult [facility] Access PROGRAM  [address], [facility] [phone] Patients are seen by appointment only. Walk-ins are not accepted. [place] will see patients 23 years of age and older. One Wednesday Evening (Monthly: Volunteer Based).  $30 per visit, cash only  [place]  [phone] for adults; Children under age 60, call Graduate Pediatric Dentistry at [phone]. Children aged 55-14, please call [phone] to request a pediatric application.  Dental services are provided in all areas of dental care including fillings, crowns and bridges, complete and partial dentures, implants, gum treatment, root canals, and extractions. Preventive care is also provided. Treatment is provided to both adults and children. Patients are selected via a lottery and there is often a waiting list.   [facility] [address], [place]  [phone] [email or web address]   [facility] [address] [place], [place] [phone], Ext. [phone] Second and Fourth Thursday of each month, opens at 6:30 AM; Clinic ends at 9 AM.  Patients are seen on a first-come first-served basis, and a limited number are seen during each clinic.   [facility]  [address], [place],  [place] [phone]    Eligibility Requirements You must have lived in [place], [place], or [place] counties for at least the last three months.   You cannot be eligible for state or federal sponsored [employer], including [employer], [place], or [employer].   You generally cannot be eligible for healthcare insurance through your employer.    How to apply: Eligibility screenings are held every Tuesday and Wednesday afternoon from 1:00 pm until 4:00 pm. You do not need an appointment for the interview!  [facility] [address] [place], [place]   [place]  [place] Department  [place]  [phone]    Behavioral Health Resources in the Community: Intensive Outpatient Programs Organization         Address  Phone  Notes  [place]. [address] [place], [place] [phone]   [facility] Outpatient [address] [place], [place]   ADS: Alcohol & Drug Svcs [address] [place], [place]   [place] [address]  [place], [place] or [phone]   Substance Abuse Resources Organization         Address  Phone  Notes  Alcohol and Drug Services  [phone]   [place]  [phone]   [facility] [place]   Chinita Pester  [phone]   Residential & Outpatient Substance Abuse Program  [phone]   Psychological Services Organization         Address  Phone  Notes  [facility]  [place]  [phone]   [place] [address], [place] or [phone]    Mobile Crisis Teams Organization         Address  Phone  Notes  [facility], Mobile Crisis Care Unit  [phone]   [facility]  [address]  [place], [place]   [place] [address], [place] [phone]    Self-Help/Support Groups Organization         Address  Phone             Notes  [place]. of [place] - variety of support groups  [place] Call for more information  Narcotics Anonymous (NA), [facility] [address], [employer] [place]  2 meetings at this location   Special educational needs teacher         Address  Phone  Notes  [facility]AP Residential Treatment [place],    [place]  [phone]   [facility]  [address], [place] [address], [place], [place]   [place], [place] [phone] Admissions: 8am-3pm M-F  Incentives Substance [place] [address],    [place], [place] [phone]   [facility] [address] Jadene Pierini [place], [place]   [facility] [address]  [place], [place]   [facility] Programs - Intensive Outpatient [address], [place], [place]   [facility] ([place].,  [place], [place] or [phone]   [facility] Services (RTS) [address], [place], [place] Accepts Medicaid  [facility] [place] [address],  [place] [phone] Substance Abuse/Addiction Treatment   [facility] Resources Organization  Address  Phone  Notes  [facility]  [phone]   Domenic Schwab, PhD [address] Arlis Porta [place], [place]   [phone] or [phone]   [place], [place] [phone]   [place] [address], [place], [place] [phone] Insurance/Medicaid/sponsorship through [facility] and Families [address]                                    [place], [place] [phone] [place] [address], [place] [phone]    Dr. Adele Schilder  [phone]   [facility] Dept. 1) [address] [place] 2) [place] 3)  [address], [place] [phone]  [phone]   [place] [phone] or [phone] (After Hours)

## 2014-06-22 NOTE — ED Notes (Signed)
Pt back from ultrasound.

## 2014-06-23 LAB — GC/CHLAMYDIA PROBE AMP
CT Probe RNA: NEGATIVE
GC Probe RNA: NEGATIVE

## 2014-07-10 ENCOUNTER — Encounter (HOSPITAL_COMMUNITY): Payer: Self-pay | Admitting: Emergency Medicine

## 2014-07-10 ENCOUNTER — Emergency Department (HOSPITAL_COMMUNITY)
Admission: EM | Admit: 2014-07-10 | Discharge: 2014-07-10 | Disposition: A | Discharge status: Home / Self Care | Payer: Medicaid Other | Attending: Emergency Medicine | Admitting: Emergency Medicine

## 2014-07-10 DIAGNOSIS — J45909 Unspecified asthma, uncomplicated: Secondary | ICD-10-CM | POA: Diagnosis not present

## 2014-07-10 DIAGNOSIS — Z3A01 Less than 8 weeks gestation of pregnancy: Secondary | ICD-10-CM | POA: Insufficient documentation

## 2014-07-10 DIAGNOSIS — O210 Mild hyperemesis gravidarum: Secondary | ICD-10-CM | POA: Insufficient documentation

## 2014-07-10 DIAGNOSIS — Z87891 Personal history of nicotine dependence: Secondary | ICD-10-CM | POA: Diagnosis not present

## 2014-07-10 DIAGNOSIS — Z79899 Other long term (current) drug therapy: Secondary | ICD-10-CM | POA: Insufficient documentation

## 2014-07-10 DIAGNOSIS — O219 Vomiting of pregnancy, unspecified: Secondary | ICD-10-CM

## 2014-07-10 DIAGNOSIS — Z3490 Encounter for supervision of normal pregnancy, unspecified, unspecified trimester: Secondary | ICD-10-CM

## 2014-07-10 LAB — COMPREHENSIVE METABOLIC PANEL
ALT: 13 U/L (ref 0–35)
AST: 18 U/L (ref 0–37)
Albumin: 3.9 g/dL (ref 3.5–5.2)
Alkaline Phosphatase: 90 U/L (ref 39–117)
Anion gap: 13 (ref 5–15)
BUN: 6 mg/dL (ref 6–23)
CO2: 22 mEq/L (ref 19–32)
Calcium: 8.7 mg/dL (ref 8.4–10.5)
Chloride: 102 mEq/L (ref 96–112)
Creatinine, Ser: 0.82 mg/dL (ref 0.50–1.10)
GFR calc Af Amer: >90 mL/min (ref >90)
GFR calc non Af Amer: >90 mL/min (ref >90)
Glucose, Bld: 89 mg/dL (ref 70–99)
Potassium: 4.1 mEq/L (ref 3.7–5.3)
Sodium: 137 mEq/L (ref 137–147)
Total Bilirubin: <0.2 mg/dL — ABNORMAL LOW (ref 0.3–1.2)
Total Protein: 7.4 g/dL (ref 6.0–8.3)

## 2014-07-10 LAB — WET PREP, GENITAL
Trich, Wet Prep: NONE SEEN
Yeast Wet Prep HPF POC: NONE SEEN

## 2014-07-10 LAB — CBC WITH DIFFERENTIAL/PLATELET
Basophils Absolute: 0 10*3/uL (ref 0.0–0.1)
Basophils Relative: 0 % (ref 0–1)
Eosinophils Absolute: 0 10*3/uL (ref 0.0–0.7)
Eosinophils Relative: 1 % (ref 0–5)
HCT: 31.9 % — ABNORMAL LOW (ref 36.0–46.0)
Hemoglobin: 10.6 g/dL — ABNORMAL LOW (ref 12.0–15.0)
Lymphocytes Relative: 33 % (ref 12–46)
Lymphs Abs: 2.1 10*3/uL (ref 0.7–4.0)
MCH: 24.8 pg — ABNORMAL LOW (ref 26.0–34.0)
MCHC: 33.2 g/dL (ref 30.0–36.0)
MCV: 74.5 fL — ABNORMAL LOW (ref 78.0–100.0)
Monocytes Absolute: 0.4 10*3/uL (ref 0.1–1.0)
Monocytes Relative: 7 % (ref 3–12)
Neutro Abs: 3.7 10*3/uL (ref 1.7–7.7)
Neutrophils Relative %: 59 % (ref 43–77)
Platelets: 296 10*3/uL (ref 150–400)
RBC: 4.28 MIL/uL (ref 3.87–5.11)
RDW: 15.4 % (ref 11.5–15.5)
WBC: 6.2 10*3/uL (ref 4.0–10.5)

## 2014-07-10 LAB — URINALYSIS, ROUTINE W REFLEX MICROSCOPIC
Bilirubin Urine: NEGATIVE
Glucose, UA: NEGATIVE mg/dL
Hgb urine dipstick: NEGATIVE
Ketones, ur: 15 mg/dL — AB
Leukocytes, UA: NEGATIVE
Nitrite: NEGATIVE
Protein, ur: NEGATIVE mg/dL
Specific Gravity, Urine: 1.019 (ref 1.005–1.030)
Urobilinogen, UA: 1 mg/dL (ref 0.0–1.0)
pH: 6 (ref 5.0–8.0)

## 2014-07-10 LAB — POC URINE PREG, ED: Preg Test, Ur: POSITIVE — AB

## 2014-07-10 LAB — PREGNANCY, URINE: Preg Test, Ur: POSITIVE — AB

## 2014-07-10 LAB — HCG, QUANTITATIVE, PREGNANCY: hCG, Beta Chain, Quant, S: 3826 m[IU]/mL — ABNORMAL HIGH (ref <5)

## 2014-07-10 MED ORDER — SODIUM CHLORIDE 0.9 % IV BOLUS (SEPSIS)
1000.0000 mL | Freq: Once | INTRAVENOUS | Status: AC
  Administered 2014-07-10: 1000 mL via INTRAVENOUS

## 2014-07-10 MED ORDER — ONDANSETRON HCL 4 MG/2ML IJ SOLN
4.0000 mg | Freq: Once | INTRAMUSCULAR | Status: AC
  Administered 2014-07-10: 4 mg via INTRAVENOUS
  Filled 2014-07-10: qty 2

## 2014-07-10 MED ORDER — ONDANSETRON 4 MG PO TBDP: ORAL_TABLET | ORAL | Status: DC

## 2014-07-10 NOTE — ED Notes (Signed)
Per EMS: pt c/o dizziness x 3 days and abd pain; pt seen here recently for ovarian cyst; pt sts not taking htn meds x several days

## 2014-07-10 NOTE — Discharge Instructions (Signed)
If you were given medicines take as directed.  If you are on coumadin or contraceptives realize their levels and effectiveness is altered by many different medicines.  If you have any reaction (rash, tongues swelling, other) to the medicines stop taking and see a physician.   Try tea and Ginger for nausea, takes Zofran for severe nausea and vomiting. Please follow up as directed and return to the ER or see a physician for new or worsening symptoms.  Thank you. Filed Vitals:   07/10/14 1903 07/10/14 2045 07/10/14 2235  BP: 127/69 132/75 118/57  Pulse: 88 85 87  Temp: 98.1 F (36.7 C)    Resp: 20 14 12   Weight: 273 lb 2 oz (123.889 kg)    SpO2: 100% 100% 100%

## 2014-07-10 NOTE — ED Provider Notes (Signed)
CSN: [id number]     Arrival date & time 07/10/14  1858 History   First MD Initiated Contact with Patient 07/10/14 2001     Chief Complaint  Patient presents with  . Dizziness  . Abdominal Pain     (Consider location/radiation/quality/duration/timing/severity/associated sxs/prior Treatment) HPI Comments: 28 year old female with history of genital herpes, ovarian cyst presents with lightheadedness for the past 3 days and abdominal pain. Patient's had intermittent lightheadedness worse with standing, nausea with decreased oral intake. Patient has had intermittent left lower pelvic pain similar to previous ovarian cyst history no changes. Patient is at intermittent epigastric discomfort with nausea. No abdominal surgery history. No vaginal bleeding or discharge.   Patient is a 28 y.o. female presenting with dizziness and abdominal pain. The history is provided by the patient.  Dizziness Associated symptoms: nausea   Associated symptoms: no chest pain, no headaches, no shortness of breath and no vomiting   Abdominal Pain Associated symptoms: nausea   Associated symptoms: no chest pain, no chills, no dysuria, no fever, no shortness of breath and no vomiting     Past Medical History  Diagnosis Date  . Asthma    History reviewed. No pertinent past surgical history. History reviewed. No pertinent family history. History  Substance Use Topics  . Smoking status: Former Smoker    Types: Cigarettes  . Smokeless tobacco: Not on file  . Alcohol Use: No   OB History    Gravida Para Term Preterm AB TAB SAB Ectopic Multiple Living            1     Review of Systems  Constitutional: Negative for fever and chills.  HENT: Negative for congestion.   Eyes: Negative for visual disturbance.  Respiratory: Negative for shortness of breath.   Cardiovascular: Negative for chest pain.  Gastrointestinal: Positive for nausea and abdominal pain. Negative for vomiting.  Genitourinary: Positive for  frequency. Negative for dysuria and flank pain.  Musculoskeletal: Negative for back pain, neck pain and neck stiffness.  Skin: Negative for rash.  Neurological: Positive for light-headedness. Negative for dizziness, syncope and headaches.      Allergies  Review of patient's allergies indicates no known allergies.  Home Medications   Prior to Admission medications   Medication Sig Start Date End Date Taking? Authorizing Provider  ibuprofen (ADVIL,MOTRIN) 400 MG tablet Take 1 tablet (400 mg total) by mouth every 6 (six) hours as needed. 06/22/14  Yes Marissa Sciacca, PA-C  lisinopril (PRINIVIL,ZESTRIL) 5 MG tablet Take 5 mg by mouth daily.   Yes Historical Provider, MD  HYDROcodone-acetaminophen (NORCO/VICODIN) 5-325 MG per tablet Take 1-2 tablets by mouth every 4 (four) hours as needed. Patient not taking: Reported on 07/10/2014 06/05/14   Nehemiah Settle A Upstill, PA-C  metroNIDAZOLE (FLAGYL) 500 MG tablet Take 1 tablet (500 mg total) by mouth 2 (two) times daily. Patient not taking: Reported on 07/10/2014 06/22/14   Marissa Sciacca, PA-C  ondansetron (ZOFRAN ODT) 4 MG disintegrating tablet 4mg  ODT q4 hours prn nausea/vomit 07/10/14   Mariea Clonts, MD  penicillin v potassium (VEETID) 500 MG tablet Take 1 tablet (500 mg total) by mouth 3 (three) times daily. Patient not taking: Reported on 07/10/2014 06/05/14   Nehemiah Settle A Upstill, PA-C   BP 136/82 mmHg  Pulse 76  Temp(Src) 98.1 F (36.7 C)  Resp 16  Wt 273 lb 2 oz (123.889 kg)  SpO2 99%  LMP 06/05/2014 Physical Exam  Constitutional: She is oriented to person, place, and time. She appears  well-developed and well-nourished.  HENT:  Head: Normocephalic and atraumatic.  Mild dry mucous membranes.  Eyes: Conjunctivae are normal. Right eye exhibits no discharge. Left eye exhibits no discharge.  Neck: Normal range of motion. Neck supple. No tracheal deviation present.  Cardiovascular: Normal rate and regular rhythm.   Pulmonary/Chest: Effort normal  and breath sounds normal.  Abdominal: Soft. She exhibits no distension. There is tenderness (mild left lower pelvis and epigastric). There is no guarding.  Musculoskeletal: She exhibits no edema.  Neurological: She is alert and oriented to person, place, and time.  Skin: Skin is warm. No rash noted.  Psychiatric: She has a normal mood and affect.  Nursing note and vitals reviewed.   ED Course  Procedures (including critical care time) EMERGENCY DEPARTMENT [place] PREGNANCY "Study: Limited Ultrasound of the Pelvis for Pregnancy"  INDICATIONS:Pregnancy(required) Multiple views of the uterus and pelvic cavity were obtained in real-time with a multi-frequency probe.  APPROACH:Transabdominal   PERFORMED BY: Myself  IMAGES ARCHIVED?: Yes  LIMITATIONS: Body habitus  PREGNANCY FREE FLUID: None  PREGNANCY FINDINGS: Intrauterine gestational sac noted  INTERPRETATION: likely early intrauterine pregnancy, no definite live IUP, no FHR or fetal pole Pt understands limitations and would rather fup outpt for formal and transvaginal   GESTATIONAL AGE, ESTIMATE: 5 wks 2 days       Labs Review Labs Reviewed  WET PREP, GENITAL - Abnormal; Notable for the following:    Clue Cells Wet Prep HPF POC FEW (*)    WBC, Wet Prep HPF POC FEW (*)    All other components within normal limits  CBC WITH DIFFERENTIAL - Abnormal; Notable for the following:    Hemoglobin 10.6 (*)    HCT 31.9 (*)    MCV 74.5 (*)    MCH 24.8 (*)    All other components within normal limits  COMPREHENSIVE METABOLIC PANEL - Abnormal; Notable for the following:    Total Bilirubin <0.2 (*)    All other components within normal limits  URINALYSIS, ROUTINE W REFLEX MICROSCOPIC - Abnormal; Notable for the following:    APPearance HAZY (*)    Ketones, ur 15 (*)    All other components within normal limits  PREGNANCY, URINE - Abnormal; Notable for the following:    Preg Test, Ur POSITIVE (*)    All other components within  normal limits  HCG, QUANTITATIVE, PREGNANCY - Abnormal; Notable for the following:    hCG, Beta Chain, Quant, S 3826 (*)    All other components within normal limits  POC URINE PREG, ED - Abnormal; Notable for the following:    Preg Test, Ur POSITIVE (*)    All other components within normal limits  GC/CHLAMYDIA PROBE AMP    Imaging Review No results found.   EKG Interpretation None      MDM   Final diagnoses:  Vomiting or nausea of pregnancy  Pregnancy   Urine delayed, urine results showed pregnancy this is new. Patient denies any vaginal bleeding and has very mild abdominal discomfort. Plan for ultrasound to look for intrauterine pregnancy.  Patient has no abdominal pain on reassessment. Formal ultrasound ordered to confirm intrauterine. Patient does not want to stay and wait for ultrasound. She has no abdominal pain, no vaginal bleeding, and feels well. I discussed we will do a bedside ultrasound, gestational sac measured 5 weeks 2 days. Unable to confirm live intrauterine pregnancy and unable to completely rule out ectopic pregnancy. Very low suspicion however patient understands this and will follow-up closely  with OB/GYN.  Results and differential diagnosis were discussed with the patient/parent/guardian. Close follow up outpatient was discussed, comfortable with the plan.   Medications  sodium chloride 0.9 % bolus 1,000 mL (0 mLs Intravenous Stopped 07/10/14 2259)  ondansetron (ZOFRAN) injection 4 mg (4 mg Intravenous Given 07/10/14 2127)    Filed Vitals:   07/10/14 2100 07/10/14 2200 07/10/14 2235 07/10/14 2353  BP: 119/47 118/57 118/57 136/82  Pulse: 82 91 87 76  Temp:      Resp:   12 16  Weight:      SpO2: 100% 100% 100% 99%    Final diagnoses:  Vomiting or nausea of pregnancy  Pregnancy        Mariea Clonts, MD 07/11/14 [phone]

## 2014-07-10 NOTE — ED Notes (Signed)
Cancelled [place] per Dr. Nuala Alpha conversation with patient

## 2014-07-10 NOTE — ED Notes (Signed)
Garnetta Buddy, RN notified of positive POC pregnancy test by B. Yolanda Bonine, EMT

## 2014-07-11 LAB — GC/CHLAMYDIA PROBE AMP
CT Probe RNA: NEGATIVE
GC Probe RNA: NEGATIVE

## 2014-07-21 ENCOUNTER — Encounter (HOSPITAL_COMMUNITY): Payer: Self-pay | Admitting: *Deleted

## 2014-07-21 ENCOUNTER — Inpatient Hospital Stay (HOSPITAL_COMMUNITY): Payer: Medicaid Other

## 2014-07-21 ENCOUNTER — Inpatient Hospital Stay (HOSPITAL_COMMUNITY)
Admission: AD | Admit: 2014-07-21 | Discharge: 2014-07-21 | Disposition: A | Discharge status: Home / Self Care | Payer: Medicaid Other | Source: Ambulatory Visit | Attending: Obstetrics & Gynecology | Admitting: Obstetrics & Gynecology

## 2014-07-21 DIAGNOSIS — R109 Unspecified abdominal pain: Secondary | ICD-10-CM | POA: Diagnosis not present

## 2014-07-21 DIAGNOSIS — O210 Mild hyperemesis gravidarum: Secondary | ICD-10-CM | POA: Insufficient documentation

## 2014-07-21 DIAGNOSIS — Z87891 Personal history of nicotine dependence: Secondary | ICD-10-CM | POA: Insufficient documentation

## 2014-07-21 DIAGNOSIS — O26899 Other specified pregnancy related conditions, unspecified trimester: Secondary | ICD-10-CM

## 2014-07-21 DIAGNOSIS — O219 Vomiting of pregnancy, unspecified: Secondary | ICD-10-CM

## 2014-07-21 DIAGNOSIS — Z3A01 Less than 8 weeks gestation of pregnancy: Secondary | ICD-10-CM | POA: Insufficient documentation

## 2014-07-21 LAB — URINALYSIS, ROUTINE W REFLEX MICROSCOPIC
Bilirubin Urine: NEGATIVE
Glucose, UA: NEGATIVE mg/dL
Hgb urine dipstick: NEGATIVE
Ketones, ur: NEGATIVE mg/dL
Leukocytes, UA: NEGATIVE
Nitrite: NEGATIVE
Protein, ur: NEGATIVE mg/dL
Specific Gravity, Urine: 1.02 (ref 1.005–1.030)
Urobilinogen, UA: 1 mg/dL (ref 0.0–1.0)
pH: 6.5 (ref 5.0–8.0)

## 2014-07-21 LAB — POCT PREGNANCY, URINE: Preg Test, Ur: POSITIVE — AB

## 2014-07-21 MED ORDER — ONDANSETRON 8 MG PO TBDP
8.0000 mg | ORAL_TABLET | Freq: Once | ORAL | Status: AC
  Administered 2014-07-21: 8 mg via ORAL
  Filled 2014-07-21: qty 1

## 2014-07-21 MED ORDER — PROMETHAZINE HCL 25 MG PO TABS: 25.0000 mg | ORAL_TABLET | Freq: Four times a day (QID) | ORAL | Status: DC | PRN

## 2014-07-21 NOTE — MAU Note (Signed)
Pt started having nausea and vomiting last week and it has gotten worse since, had a positive home pregnancy test.  Denies VB, minor cramping on lower right side.

## 2014-07-21 NOTE — MAU Provider Note (Signed)
History     CSN: [id number]  Arrival date and time: 07/21/14 6433   First Provider Initiated Contact with Patient 07/21/14 1014      Chief Complaint  Patient presents with  . Hyperemesis Gravidarum   HPI 28 y.o. [id number] at [redacted]w[redacted]d w/ n/v x 1 week, states unable to keep anything down, no meds for n/v at home. 8 lb weight loss since 07/10/14 (273 to 265). Pt [facility] for abdominal pain in early pregnancy, bedside u/s showed possible IUGS, unable to confirm IUP, pt did not wait for official u/s. Had pelvic u/s on 11/20 showing 1.7 cm R ovarian cyst, likely hemorrhagic cyst.   Past Medical History  Diagnosis Date  . Asthma     History reviewed. No pertinent past surgical history.  History reviewed. No pertinent family history.  History  Substance Use Topics  . Smoking status: Former Smoker    Types: Cigarettes  . Smokeless tobacco: Not on file  . Alcohol Use: No    Allergies: No Known Allergies  No prescriptions prior to admission    Review of Systems  Constitutional: Negative.   Respiratory: Negative.   Cardiovascular: Negative.   Gastrointestinal: Positive for nausea and vomiting. Negative for abdominal pain, diarrhea and constipation.  Genitourinary: Negative for dysuria, urgency, frequency, hematuria and flank pain.       Negative for vaginal bleeding, vaginal discharge, dyspareunia  Musculoskeletal: Negative.   Neurological: Negative.   Psychiatric/Behavioral: Negative.    Physical Exam   Blood pressure 116/49, pulse 54, resp. rate 18, height 5\' 8"  (1.727 m), weight 265 lb 6 oz (120.373 kg), last menstrual period 06/01/2014.  Physical Exam  Nursing note and vitals reviewed. Constitutional: She appears well-developed and well-nourished. No distress.  Cardiovascular: Normal rate.   Respiratory: Effort normal.  GI: Soft. There is no tenderness.  Musculoskeletal: Normal range of motion.  Neurological: She is alert.  Skin: Skin is warm and dry.   Psychiatric: She has a normal mood and affect.    MAU Course  Procedures  Results for orders placed or performed during the hospital encounter of 07/21/14 (from the past 24 hour(s))  Urinalysis, Routine w reflex microscopic     Status: None   Collection Time: 07/21/14  9:50 AM  Result Value Ref Range   Color, Urine YELLOW YELLOW   APPearance CLEAR CLEAR   Specific Gravity, Urine 1.020 1.005 - 1.030   pH 6.5 5.0 - 8.0   Glucose, UA NEGATIVE NEGATIVE mg/dL   Hgb urine dipstick NEGATIVE NEGATIVE   Bilirubin Urine NEGATIVE NEGATIVE   Ketones, ur NEGATIVE NEGATIVE mg/dL   Protein, ur NEGATIVE NEGATIVE mg/dL   Urobilinogen, UA 1.0 0.0 - 1.0 mg/dL   Nitrite NEGATIVE NEGATIVE   Leukocytes, UA NEGATIVE NEGATIVE  Pregnancy, urine POC     Status: Abnormal   Collection Time: 07/21/14  9:50 AM  Result Value Ref Range   Preg Test, Ur POSITIVE (A) NEGATIVE   Zofran ODT 8 MG in MAU for n/v, feeling better  US Ob Comp Less 14 Wks  07/21/2014   CLINICAL DATA:  Right lower quadrant pain with nausea, vomiting, and cramping  EXAM: OBSTETRIC <14 WK [place] AND TRANSVAGINAL OB US  TECHNIQUE: Both transabdominal and transvaginal ultrasound examinations were performed for complete evaluation of the gestation as well as the maternal uterus, adnexal regions, and pelvic cul-de-sac. Transvaginal technique was performed to assess early pregnancy.  COMPARISON:  June 21, 2014  FINDINGS: Intrauterine gestational sac: Visualized/normal in shape.  Yolk sac:  Visualized  Embryo:  Visualized  Cardiac Activity: Visualized  Heart Rate:  165 bpm  CRL:   6  mm   6 w 3 d                  [place] EDC: March 13, 2015  Maternal uterus/adnexae: There is an apparent pedunculated leiomyoma arising from the right uterine fundus measuring 2.9 x 4.5 x 3.2 cm. In the left side of the uterus, there is a calcified leiomyoma measuring 3.2 x 2.0 x 1.8 cm. There is no evidence of subchorionic hemorrhage. The cervical os is closed. The right  ovary appears normal. There is a probable corpus luteum in the left ovary measuring 2.4 x 2.5 x 2.2 cm. There is trace free pelvic fluid.  IMPRESSION: Single live intrauterine gestation with estimated gestational age of 6+ weeks. Leiomyomatous uterus. Minimal free pelvic fluid is probably physiologic.   Electronically Signed   By: Lowella Grip M.D.   On: 07/21/2014 11:25   US Ob Transvaginal  07/21/2014   CLINICAL DATA:  Right lower quadrant pain with nausea, vomiting, and cramping  EXAM: OBSTETRIC <14 WK [place] AND TRANSVAGINAL OB US  TECHNIQUE: Both transabdominal and transvaginal ultrasound examinations were performed for complete evaluation of the gestation as well as the maternal uterus, adnexal regions, and pelvic cul-de-sac. Transvaginal technique was performed to assess early pregnancy.  COMPARISON:  June 21, 2014  FINDINGS: Intrauterine gestational sac: Visualized/normal in shape.  Yolk sac:  Visualized  Embryo:  Visualized  Cardiac Activity: Visualized  Heart Rate:  165 bpm  CRL:   6  mm   6 w 3 d                  [place] EDC: March 13, 2015  Maternal uterus/adnexae: There is an apparent pedunculated leiomyoma arising from the right uterine fundus measuring 2.9 x 4.5 x 3.2 cm. In the left side of the uterus, there is a calcified leiomyoma measuring 3.2 x 2.0 x 1.8 cm. There is no evidence of subchorionic hemorrhage. The cervical os is closed. The right ovary appears normal. There is a probable corpus luteum in the left ovary measuring 2.4 x 2.5 x 2.2 cm. There is trace free pelvic fluid.  IMPRESSION: Single live intrauterine gestation with estimated gestational age of 6+ weeks. Leiomyomatous uterus. Minimal free pelvic fluid is probably physiologic.   Electronically Signed   By: Lowella Grip M.D.   On: 07/21/2014 11:25   US Transvaginal Non-ob  06/22/2014   CLINICAL DATA:  Pelvic pain  EXAM: TRANSABDOMINAL AND TRANSVAGINAL ULTRASOUND OF PELVIS  DOPPLER ULTRASOUND OF OVARIES  TECHNIQUE: Both  transabdominal and transvaginal ultrasound examinations of the pelvis were performed. Transabdominal technique was performed for global imaging of the pelvis including uterus, ovaries, adnexal regions, and pelvic cul-de-sac.  It was necessary to proceed with endovaginal exam following the transabdominal exam to visualize the ovaries. Color and duplex Doppler ultrasound was utilized to evaluate blood flow to the ovaries.  COMPARISON:  None.  FINDINGS: Uterus  Measurements: 9.1 x 5.1 x 6.0 cm. Echogenic areas are noted within the myometrium consist with uterine fibroids. One measures 2.2 cm lying anteriorly. A second more posteriorly oriented measures 1.7 cm.  Endometrium  Thickness: Prominent at 14 mm. This may be related to the patient's menstrual status.  Right ovary  Measurements: 4.6 x 2.5 x 2.9 cm. Normal appearance/no adnexal mass.  Left ovary  Measurements: 4.2 x 2.8 x 2.9 cm. A  1.7 cm cystic structure is noted within the ovary with some internal debris likely representing hemorrhagic cyst. Follicular changes are noted  Pulsed Doppler evaluation of both ovaries demonstrates normal low-resistance arterial and venous waveforms.  Other findings  Small amount of free fluid is noted.  IMPRESSION: Cystic structure within the left ovary with some internal debris which may represent a hemorrhagic cyst or possibly an involuting cyst given the free fluid.  Uterine fibroids   Electronically Signed   By: Inez Catalina M.D.   On: 06/22/2014 00:26   US Pelvis Complete  06/22/2014   CLINICAL DATA:  Pelvic pain  EXAM: TRANSABDOMINAL AND TRANSVAGINAL ULTRASOUND OF PELVIS  DOPPLER ULTRASOUND OF OVARIES  TECHNIQUE: Both transabdominal and transvaginal ultrasound examinations of the pelvis were performed. Transabdominal technique was performed for global imaging of the pelvis including uterus, ovaries, adnexal regions, and pelvic cul-de-sac.  It was necessary to proceed with endovaginal exam following the transabdominal exam  to visualize the ovaries. Color and duplex Doppler ultrasound was utilized to evaluate blood flow to the ovaries.  COMPARISON:  None.  FINDINGS: Uterus  Measurements: 9.1 x 5.1 x 6.0 cm. Echogenic areas are noted within the myometrium consist with uterine fibroids. One measures 2.2 cm lying anteriorly. A second more posteriorly oriented measures 1.7 cm.  Endometrium  Thickness: Prominent at 14 mm. This may be related to the patient's menstrual status.  Right ovary  Measurements: 4.6 x 2.5 x 2.9 cm. Normal appearance/no adnexal mass.  Left ovary  Measurements: 4.2 x 2.8 x 2.9 cm. A 1.7 cm cystic structure is noted within the ovary with some internal debris likely representing hemorrhagic cyst. Follicular changes are noted  Pulsed Doppler evaluation of both ovaries demonstrates normal low-resistance arterial and venous waveforms.  Other findings  Small amount of free fluid is noted.  IMPRESSION: Cystic structure within the left ovary with some internal debris which may represent a hemorrhagic cyst or possibly an involuting cyst given the free fluid.  Uterine fibroids   Electronically Signed   By: Inez Catalina M.D.   On: 06/22/2014 00:26   [place] Art/ven Flow Abd Pelv Doppler  06/22/2014   CLINICAL DATA:  Pelvic pain  EXAM: TRANSABDOMINAL AND TRANSVAGINAL ULTRASOUND OF PELVIS  DOPPLER ULTRASOUND OF OVARIES  TECHNIQUE: Both transabdominal and transvaginal ultrasound examinations of the pelvis were performed. Transabdominal technique was performed for global imaging of the pelvis including uterus, ovaries, adnexal regions, and pelvic cul-de-sac.  It was necessary to proceed with endovaginal exam following the transabdominal exam to visualize the ovaries. Color and duplex Doppler ultrasound was utilized to evaluate blood flow to the ovaries.  COMPARISON:  None.  FINDINGS: Uterus  Measurements: 9.1 x 5.1 x 6.0 cm. Echogenic areas are noted within the myometrium consist with uterine fibroids. One measures 2.2 cm lying  anteriorly. A second more posteriorly oriented measures 1.7 cm.  Endometrium  Thickness: Prominent at 14 mm. This may be related to the patient's menstrual status.  Right ovary  Measurements: 4.6 x 2.5 x 2.9 cm. Normal appearance/no adnexal mass.  Left ovary  Measurements: 4.2 x 2.8 x 2.9 cm. A 1.7 cm cystic structure is noted within the ovary with some internal debris likely representing hemorrhagic cyst. Follicular changes are noted  Pulsed Doppler evaluation of both ovaries demonstrates normal low-resistance arterial and venous waveforms.  Other findings  Small amount of free fluid is noted.  IMPRESSION: Cystic structure within the left ovary with some internal debris which may represent a hemorrhagic cyst or  possibly an involuting cyst given the free fluid.  Uterine fibroids   Electronically Signed   By: Inez Catalina M.D.   On: 06/22/2014 00:26       Assessment and Plan   1. Nausea/vomiting in pregnancy   2. Abdominal pain in pregnancy, antepartum   Rx phenergan, f/u for prenatal care    Medication List    STOP taking these medications        HYDROcodone-acetaminophen 5-325 MG per tablet  Commonly known as:  NORCO/VICODIN     ibuprofen 400 MG tablet  Commonly known as:  ADVIL,MOTRIN     metroNIDAZOLE 500 MG tablet  Commonly known as:  FLAGYL     ondansetron 4 MG disintegrating tablet  Commonly known as:  ZOFRAN ODT     penicillin v potassium 500 MG tablet  Commonly known as:  VEETID      TAKE these medications        promethazine 25 MG tablet  Commonly known as:  PHENERGAN  Take 1 tablet (25 mg total) by mouth every 6 (six) hours as needed for nausea or vomiting.            Follow-up Information    Follow up with [facility].   Specialty:  Obstetrics and Gynecology   Why:  as scheduled   Contact information:   [place] [phone]        [place] 07/21/2014, 5:43 PM

## 2014-08-03 NOTE — L&D Delivery Note (Signed)
Delivery Note At 10:48 AM a viable and healthy female was delivered via Vaginal, Spontaneous Delivery (Presentation: ; Occiput Anterior).  APGAR: 9,9 ; weight pending.   Placenta status: Intact, Spontaneous.  Cord: 3 vessels with the following complications: none.  Cord pH: N/A  Anesthesia:  Epidural Episiotomy: None Lacerations: 2nd degree;Perineal;Sulcus Suture Repair: 3.0 vicryl Est. Blood Loss (mL):  75  Mom to postpartum.  Baby to Couplet care / Skin to Skin.  Melina Schools 03/05/2015, 11:58 AM  I was present for this delivery and agree with the above resident's note.  LEFTWICH-KIRBY, Nahara Dona, CNM 1:51 PM

## 2014-08-28 ENCOUNTER — Ambulatory Visit (INDEPENDENT_AMBULATORY_CARE_PROVIDER_SITE_OTHER): Payer: Medicaid Other | Admitting: Family Medicine

## 2014-08-28 ENCOUNTER — Other Ambulatory Visit: Payer: Self-pay | Admitting: Family Medicine

## 2014-08-28 ENCOUNTER — Encounter: Payer: Self-pay | Admitting: Family Medicine

## 2014-08-28 ENCOUNTER — Other Ambulatory Visit (HOSPITAL_COMMUNITY)
Admission: RE | Admit: 2014-08-28 | Discharge: 2014-08-28 | Disposition: A | Discharge status: Home / Self Care | Payer: Medicaid Other | Source: Ambulatory Visit | Attending: Family Medicine | Admitting: Family Medicine

## 2014-08-28 VITALS — BP 125/74 | HR 80 | Temp 97.9°F | Wt 254.1 lb

## 2014-08-28 DIAGNOSIS — Z3482 Encounter for supervision of other normal pregnancy, second trimester: Secondary | ICD-10-CM | POA: Diagnosis not present

## 2014-08-28 DIAGNOSIS — Z113 Encounter for screening for infections with a predominantly sexual mode of transmission: Secondary | ICD-10-CM | POA: Insufficient documentation

## 2014-08-28 DIAGNOSIS — O98519 Other viral diseases complicating pregnancy, unspecified trimester: Secondary | ICD-10-CM

## 2014-08-28 DIAGNOSIS — B009 Herpesviral infection, unspecified: Secondary | ICD-10-CM | POA: Insufficient documentation

## 2014-08-28 DIAGNOSIS — Z3480 Encounter for supervision of other normal pregnancy, unspecified trimester: Secondary | ICD-10-CM | POA: Insufficient documentation

## 2014-08-28 DIAGNOSIS — A6000 Herpesviral infection of urogenital system, unspecified: Secondary | ICD-10-CM

## 2014-08-28 DIAGNOSIS — Z3682 Encounter for antenatal screening for nuchal translucency: Secondary | ICD-10-CM

## 2014-08-28 DIAGNOSIS — Z23 Encounter for immunization: Secondary | ICD-10-CM

## 2014-08-28 DIAGNOSIS — Z01419 Encounter for gynecological examination (general) (routine) without abnormal findings: Secondary | ICD-10-CM | POA: Diagnosis not present

## 2014-08-28 DIAGNOSIS — Z3492 Encounter for supervision of normal pregnancy, unspecified, second trimester: Secondary | ICD-10-CM

## 2014-08-28 LAB — POCT URINALYSIS DIP (DEVICE)
Bilirubin Urine: NEGATIVE
Glucose, UA: NEGATIVE mg/dL
Hgb urine dipstick: NEGATIVE
Ketones, ur: NEGATIVE mg/dL
Leukocytes, UA: NEGATIVE
Nitrite: NEGATIVE
Protein, ur: NEGATIVE mg/dL
Specific Gravity, Urine: 1.015 (ref 1.005–1.030)
Urobilinogen, UA: 1 mg/dL (ref 0.0–1.0)
pH: 6.5 (ref 5.0–8.0)

## 2014-08-28 MED ORDER — PRENATAL VITAMINS 0.8 MG PO TABS: 1.0000 | ORAL_TABLET | Freq: Every day | ORAL | Status: DC

## 2014-08-28 MED ORDER — VALACYCLOVIR HCL 500 MG PO TABS: 500.0000 mg | ORAL_TABLET | Freq: Two times a day (BID) | ORAL | Status: DC

## 2014-08-28 NOTE — Progress Notes (Signed)
Initial OB visit, Flu vaccine with new OB labs including 1 hour glucose New OB packet material given.

## 2014-08-28 NOTE — Progress Notes (Signed)
   Subjective:    [name] is a G3P1011 [redacted]w[redacted]d being seen today for her first obstetrical visit.  Her obstetrical history is significant for obesity. Patient does intend to breast feed. Pregnancy history fully reviewed.  Patient reports nausea, no bleeding, no contractions, no cramping and no leaking.  Filed Vitals:   08/28/14 1302  BP: 125/74  Pulse: 80  Temp: 97.9 F (36.6 C)  Weight: 254 lb 1.6 oz (115.259 kg)    HISTORY: OB History  Gravida Para Term Preterm AB SAB TAB Ectopic Multiple Living  3 1 1  1 1    1     # Outcome Date GA Lbr Len/2nd Weight Sex Delivery Anes PTL Lv  3 Current           2 Term 11/02/12 [redacted]w[redacted]d  7 lb 5 oz (3.317 kg) F Vag-Spont EPI N Y  1 SAB 2014             Past Medical History  Diagnosis Date  . Asthma    Past Surgical History  Procedure Laterality Date  . Dilation and curettage of uterus     Family History  Problem Relation Age of Onset  . Diabetes Mother   . Hypertension Mother   . Hyperlipidemia Mother   . Heart disease Mother   . Diabetes Sister   . Diabetes Brother      Exam    Uterus:     Pelvic Exam:    Perineum: No Hemorrhoids, Normal Perineum   Vulva: normal   Vagina:  normal mucosa, normal discharge   Cervix: no bleeding following Pap, no cervical motion tenderness and no lesions   Adnexa: normal adnexa and no mass, fullness, tenderness  System:     Skin: normal coloration and turgor, no rashes    Neurologic: gait normal; reflexes normal and symmetric   Extremities: normal strength, tone, and muscle mass   HEENT PERRLA, extra ocular movement intact and sclera clear, anicteric   Mouth/Teeth mucous membranes moist, pharynx normal without lesions   Neck supple and no masses   Cardiovascular: regular rate and rhythm, no murmurs or gallops   Respiratory:  appears well, vitals normal, no respiratory distress, acyanotic, normal RR, ear and throat exam is normal, neck free of mass or lymphadenopathy, chest clear, no  wheezing, crepitations, rhonchi, normal symmetric air entry   Abdomen: soft, non-tender; bowel sounds normal; no masses,  no organomegaly   Urinary: urethral meatus normal      Assessment:    Pregnancy: [id number] Patient Active Problem List   Diagnosis Date Noted  . Supervision of other normal pregnancy 08/28/2014  . Herpes genitalia 08/28/2014        Plan:     Initial labs drawn. Prenatal vitamins. Problem list reviewed and updated. Genetic Screening discussed First Screen: ordered.  Ultrasound discussed; fetal survey: requested.  Follow up in 4 weeks. 50% of 30 min visit spent on counseling and coordination of care.     [name] 08/28/2014

## 2014-08-29 LAB — PRENATAL PROFILE (SOLSTAS)
Antibody Screen: NEGATIVE
Basophils Absolute: 0 10*3/uL (ref 0.0–0.1)
Basophils Relative: 0 % (ref 0–1)
Eosinophils Absolute: 0.1 10*3/uL (ref 0.0–0.7)
Eosinophils Relative: 1 % (ref 0–5)
HCT: 32.9 % — ABNORMAL LOW (ref 36.0–46.0)
HIV 1&2 Ab, 4th Generation: NONREACTIVE
Hemoglobin: 10.7 g/dL — ABNORMAL LOW (ref 12.0–15.0)
Hepatitis B Surface Ag: NEGATIVE
Lymphocytes Relative: 22 % (ref 12–46)
Lymphs Abs: 1.5 10*3/uL (ref 0.7–4.0)
MCH: 24.2 pg — ABNORMAL LOW (ref 26.0–34.0)
MCHC: 32.5 g/dL (ref 30.0–36.0)
MCV: 74.3 fL — ABNORMAL LOW (ref 78.0–100.0)
MPV: 7.9 fL — ABNORMAL LOW (ref 8.6–12.4)
Monocytes Absolute: 0.5 10*3/uL (ref 0.1–1.0)
Monocytes Relative: 7 % (ref 3–12)
Neutro Abs: 4.8 10*3/uL (ref 1.7–7.7)
Neutrophils Relative %: 70 % (ref 43–77)
Platelets: 315 10*3/uL (ref 150–400)
RBC: 4.43 MIL/uL (ref 3.87–5.11)
RDW: 18.3 % — ABNORMAL HIGH (ref 11.5–15.5)
Rh Type: POSITIVE
Rubella: 1.02 Index — ABNORMAL HIGH (ref <0.90)
WBC: 6.9 10*3/uL (ref 4.0–10.5)

## 2014-08-29 LAB — GLUCOSE TOLERANCE, 1 HOUR (50G) W/O FASTING: Glucose, 1 Hour GTT: 90 mg/dL (ref 70–140)

## 2014-08-29 LAB — CYTOLOGY - PAP

## 2014-08-30 LAB — PRESCRIPTION MONITORING PROFILE (19 PANEL)
Amphetamine/Meth: NEGATIVE ng/mL
Barbiturate Screen, Urine: NEGATIVE ng/mL
Benzodiazepine Screen, Urine: NEGATIVE ng/mL
Buprenorphine, Urine: NEGATIVE ng/mL
Cannabinoid Scrn, Ur: NEGATIVE ng/mL
Carisoprodol, Urine: NEGATIVE ng/mL
Cocaine Metabolites: NEGATIVE ng/mL
Creatinine, Urine: 245.4 mg/dL (ref >20.0)
Fentanyl, Ur: NEGATIVE ng/mL
MDMA URINE: NEGATIVE ng/mL
Meperidine, Ur: NEGATIVE ng/mL
Methadone Screen, Urine: NEGATIVE ng/mL
Methaqualone: NEGATIVE ng/mL
Nitrites, Initial: NEGATIVE ug/mL
Opiate Screen, Urine: NEGATIVE ng/mL
Oxycodone Screen, Ur: NEGATIVE ng/mL
Phencyclidine, Ur: NEGATIVE ng/mL
Propoxyphene: NEGATIVE ng/mL
Tapentadol, urine: NEGATIVE ng/mL
Tramadol Scrn, Ur: NEGATIVE ng/mL
Zolpidem, Urine: NEGATIVE ng/mL
pH, Initial: 6.6 pH (ref 4.5–8.9)

## 2014-08-30 LAB — CULTURE, OB URINE
Colony Count: NO GROWTH
Organism ID, Bacteria: NO GROWTH

## 2014-08-30 LAB — HEMOGLOBINOPATHY EVALUATION
Hemoglobin Other: 0 %
Hgb A2 Quant: 3.3 % — ABNORMAL HIGH (ref 2.2–3.2)
Hgb A: 64.4 % — ABNORMAL LOW (ref 96.8–97.8)
Hgb F Quant: 0 % (ref 0.0–2.0)
Hgb S Quant: 32.3 % — ABNORMAL HIGH

## 2014-09-01 ENCOUNTER — Encounter: Payer: Self-pay | Admitting: Family Medicine

## 2014-09-01 DIAGNOSIS — D573 Sickle-cell trait: Secondary | ICD-10-CM

## 2014-09-01 HISTORY — DX: Sickle-cell trait: D57.3

## 2014-09-03 ENCOUNTER — Ambulatory Visit (HOSPITAL_COMMUNITY)
Admission: RE | Admit: 2014-09-03 | Discharge: 2014-09-03 | Disposition: A | Discharge status: Home / Self Care | Payer: Medicaid Other | Source: Ambulatory Visit | Attending: Family Medicine | Admitting: Family Medicine

## 2014-09-03 ENCOUNTER — Telehealth: Payer: Self-pay

## 2014-09-03 ENCOUNTER — Encounter (HOSPITAL_COMMUNITY): Payer: Self-pay

## 2014-09-03 DIAGNOSIS — Z36 Encounter for antenatal screening of mother: Secondary | ICD-10-CM | POA: Insufficient documentation

## 2014-09-03 DIAGNOSIS — Z369 Encounter for antenatal screening, unspecified: Secondary | ICD-10-CM | POA: Insufficient documentation

## 2014-09-03 DIAGNOSIS — Z3A13 13 weeks gestation of pregnancy: Secondary | ICD-10-CM | POA: Diagnosis not present

## 2014-09-03 DIAGNOSIS — Z3682 Encounter for antenatal screening for nuchal translucency: Secondary | ICD-10-CM

## 2014-09-03 DIAGNOSIS — O99211 Obesity complicating pregnancy, first trimester: Secondary | ICD-10-CM | POA: Insufficient documentation

## 2014-09-03 NOTE — Telephone Encounter (Signed)
-----   Message from Truett Mainland, DO sent at 09/01/2014  9:47 AM EST ----- Patient has sickle cell trait.  Her partner should get tested for sickle cell as well.  Please let the patient know.

## 2014-09-03 NOTE — Telephone Encounter (Signed)
Attempted to contact patient. No answer. Left message stating we are calling to inform you of results, please call clinic.

## 2014-09-04 NOTE — Telephone Encounter (Signed)
Patient called into front office returning call. Informed patient of results and recommendations. Patient verbalized understanding and states she is already aware from her last pregnancy and that her partner was already tested then as well. Patient asked if her other results were normal and told her yes. Patient asked about being tested for herpes and if she had to be in an outbreak to be tested. Asked patient if she has been already told she had herpes in the past and she states yes. Told patient that she would not need to be retested then and that a doctor will most likely put her on a medication for that towards the end of her pregnancy. Patient verbalized understanding and states that a medication has already been sent to her pharmacy and that it costs $100 but that her insurance was switching over so she was going to contact her case worker and call us back if she has problems getting the medication. Patient had no other questions

## 2014-09-20 ENCOUNTER — Other Ambulatory Visit (HOSPITAL_COMMUNITY): Payer: Self-pay

## 2014-09-26 ENCOUNTER — Encounter: Payer: Self-pay | Admitting: *Deleted

## 2014-09-26 ENCOUNTER — Ambulatory Visit (INDEPENDENT_AMBULATORY_CARE_PROVIDER_SITE_OTHER): Payer: Medicaid Other | Admitting: Family

## 2014-09-26 ENCOUNTER — Encounter: Payer: Self-pay | Admitting: Physician Assistant

## 2014-09-26 VITALS — BP 126/64 | HR 80 | Wt 260.7 lb

## 2014-09-26 DIAGNOSIS — Z3480 Encounter for supervision of other normal pregnancy, unspecified trimester: Secondary | ICD-10-CM

## 2014-09-26 DIAGNOSIS — D573 Sickle-cell trait: Secondary | ICD-10-CM

## 2014-09-26 DIAGNOSIS — Z3482 Encounter for supervision of other normal pregnancy, second trimester: Secondary | ICD-10-CM

## 2014-09-26 LAB — POCT URINALYSIS DIP (DEVICE)
Bilirubin Urine: NEGATIVE
Glucose, UA: NEGATIVE mg/dL
Hgb urine dipstick: NEGATIVE
Ketones, ur: NEGATIVE mg/dL
Leukocytes, UA: NEGATIVE
Nitrite: NEGATIVE
Protein, ur: NEGATIVE mg/dL
Specific Gravity, Urine: 1.015 (ref 1.005–1.030)
Urobilinogen, UA: 0.2 mg/dL (ref 0.0–1.0)
pH: 6.5 (ref 5.0–8.0)

## 2014-09-26 NOTE — Progress Notes (Signed)
Ultrasound scheduled for 3/9 @ 930

## 2014-09-26 NOTE — Progress Notes (Signed)
Reviewed new ob labs with patient.  Scheduled anatomy ultrasound.

## 2014-09-26 NOTE — Progress Notes (Signed)
Pt not taking pnv or valtrex went to pharmacy to get them and they are too expensive.

## 2014-10-05 ENCOUNTER — Encounter: Payer: Self-pay | Admitting: *Deleted

## 2014-10-10 ENCOUNTER — Ambulatory Visit (HOSPITAL_COMMUNITY)
Admission: RE | Admit: 2014-10-10 | Discharge: 2014-10-10 | Disposition: A | Discharge status: Home / Self Care | Payer: Medicaid Other | Source: Ambulatory Visit | Attending: Family | Admitting: Family

## 2014-10-10 ENCOUNTER — Other Ambulatory Visit: Payer: Self-pay | Admitting: Family

## 2014-10-10 DIAGNOSIS — O99212 Obesity complicating pregnancy, second trimester: Secondary | ICD-10-CM | POA: Insufficient documentation

## 2014-10-10 DIAGNOSIS — Z3482 Encounter for supervision of other normal pregnancy, second trimester: Secondary | ICD-10-CM

## 2014-10-10 DIAGNOSIS — Z3689 Encounter for other specified antenatal screening: Secondary | ICD-10-CM | POA: Insufficient documentation

## 2014-10-10 DIAGNOSIS — Z3A18 18 weeks gestation of pregnancy: Secondary | ICD-10-CM | POA: Diagnosis not present

## 2014-10-10 DIAGNOSIS — O99210 Obesity complicating pregnancy, unspecified trimester: Secondary | ICD-10-CM | POA: Insufficient documentation

## 2014-10-24 ENCOUNTER — Ambulatory Visit (INDEPENDENT_AMBULATORY_CARE_PROVIDER_SITE_OTHER): Payer: Medicaid Other | Admitting: Physician Assistant

## 2014-10-24 ENCOUNTER — Encounter: Payer: Self-pay | Admitting: Physician Assistant

## 2014-10-24 VITALS — BP 127/66 | Wt 265.0 lb

## 2014-10-24 DIAGNOSIS — Z3402 Encounter for supervision of normal first pregnancy, second trimester: Secondary | ICD-10-CM

## 2014-10-24 LAB — POCT URINALYSIS DIP (DEVICE)
Bilirubin Urine: NEGATIVE
Glucose, UA: NEGATIVE mg/dL
Hgb urine dipstick: NEGATIVE
Ketones, ur: NEGATIVE mg/dL
Leukocytes, UA: NEGATIVE
Nitrite: NEGATIVE
Protein, ur: NEGATIVE mg/dL
Specific Gravity, Urine: 1.02 (ref 1.005–1.030)
Urobilinogen, UA: 0.2 mg/dL (ref 0.0–1.0)
pH: 6 (ref 5.0–8.0)

## 2014-10-24 NOTE — Patient Instructions (Signed)
Second Trimester of Pregnancy The second trimester is from week 13 through week 28, months 4 through 6. The second trimester is often a time when you feel your best. Your body has also adjusted to being pregnant, and you begin to feel better physically. Usually, morning sickness has lessened or quit completely, you may have more energy, and you may have an increase in appetite. The second trimester is also a time when the fetus is growing rapidly. At the end of the sixth month, the fetus is about 9 inches long and weighs about 1 pounds. You will likely begin to feel the baby move (quickening) between 18 and 20 weeks of the pregnancy. BODY CHANGES Your body goes through many changes during pregnancy. The changes vary from woman to woman.   Your weight will continue to increase. You will notice your lower abdomen bulging out.  You may begin to get stretch marks on your hips, abdomen, and breasts.  You may develop headaches that can be relieved by medicines approved by your health care provider.  You may urinate more often because the fetus is pressing on your bladder.  You may develop or continue to have heartburn as a result of your pregnancy.  You may develop constipation because certain hormones are causing the muscles that push waste through your intestines to slow down.  You may develop hemorrhoids or swollen, bulging veins (varicose veins).  You may have back pain because of the weight gain and pregnancy hormones relaxing your joints between the bones in your pelvis and as a result of a shift in weight and the muscles that support your balance.  Your breasts will continue to grow and be tender.  Your gums may bleed and may be sensitive to brushing and flossing.  Dark spots or blotches (chloasma, mask of pregnancy) may develop on your face. This will likely fade after the baby is born.  A dark line from your belly button to the pubic area (linea nigra) may appear. This will likely fade  after the baby is born.  You may have changes in your hair. These can include thickening of your hair, rapid growth, and changes in texture. Some women also have hair loss during or after pregnancy, or hair that feels dry or thin. Your hair will most likely return to normal after your baby is born. WHAT TO EXPECT AT YOUR PRENATAL VISITS During a routine prenatal visit:  You will be weighed to make sure you and the fetus are growing normally.  Your blood pressure will be taken.  Your abdomen will be measured to track your baby's growth.  The fetal heartbeat will be listened to.  Any test results from the previous visit will be discussed. Your health care provider may ask you:  How you are feeling.  If you are feeling the baby move.  If you have had any abnormal symptoms, such as leaking fluid, bleeding, severe headaches, or abdominal cramping.  If you have any questions. Other tests that may be performed during your second trimester include:  Blood tests that check for:  Low iron levels (anemia).  Gestational diabetes (between 24 and 28 weeks).  Rh antibodies.  Urine tests to check for infections, diabetes, or protein in the urine.  An ultrasound to confirm the proper growth and development of the baby.  An amniocentesis to check for possible genetic problems.  Fetal screens for spina bifida and Down syndrome. HOME CARE INSTRUCTIONS   Avoid all smoking, herbs, alcohol, and unprescribed   drugs. These chemicals affect the formation and growth of the baby.  Follow your health care provider's instructions regarding medicine use. There are medicines that are either safe or unsafe to take during pregnancy.  Exercise only as directed by your health care provider. Experiencing uterine cramps is a good sign to stop exercising.  Continue to eat regular, healthy meals.  Wear a good support bra for breast tenderness.  Do not use hot tubs, steam rooms, or saunas.  Wear your  seat belt at all times when driving.  Avoid raw meat, uncooked cheese, cat litter boxes, and soil used by cats. These carry germs that can cause birth defects in the baby.  Take your prenatal vitamins.  Try taking a stool softener (if your health care provider approves) if you develop constipation. Eat more high-fiber foods, such as fresh vegetables or fruit and whole grains. Drink plenty of fluids to keep your urine clear or pale yellow.  Take warm sitz baths to soothe any pain or discomfort caused by hemorrhoids. Use hemorrhoid cream if your health care provider approves.  If you develop varicose veins, wear support hose. Elevate your feet for 15 minutes, 3-4 times a day. Limit salt in your diet.  Avoid heavy lifting, wear low heel shoes, and practice good posture.  Rest with your legs elevated if you have leg cramps or low back pain.  Visit your dentist if you have not gone yet during your pregnancy. Use a soft toothbrush to brush your teeth and be gentle when you floss.  A sexual relationship may be continued unless your health care provider directs you otherwise.  Continue to go to all your prenatal visits as directed by your health care provider. SEEK MEDICAL CARE IF:   You have dizziness.  You have mild pelvic cramps, pelvic pressure, or nagging pain in the abdominal area.  You have persistent nausea, vomiting, or diarrhea.  You have a bad smelling vaginal discharge.  You have pain with urination. SEEK IMMEDIATE MEDICAL CARE IF:   You have a fever.  You are leaking fluid from your vagina.  You have spotting or bleeding from your vagina.  You have severe abdominal cramping or pain.  You have rapid weight gain or loss.  You have shortness of breath with chest pain.  You notice sudden or extreme swelling of your face, hands, ankles, feet, or legs.  You have not felt your baby move in over an hour.  You have severe headaches that do not go away with  medicine.  You have vision changes. Document Released: 07/14/2001 Document Revised: 07/25/2013 Document Reviewed: 09/20/2012 ExitCare Patient Information 2015 [employer]. This information is not intended to replace advice given to you by your health care provider. Make sure you discuss any questions you have with your health care provider.  

## 2014-10-24 NOTE — Progress Notes (Signed)
Having some low back pain.

## 2014-10-24 NOTE — Progress Notes (Signed)
20 weeks, stable Endorses good FM.  Denies VB, LOF, dysuria.  PNV qd Wear good supportive shoes and maternity support belt RTC 4 weeks.

## 2014-11-23 ENCOUNTER — Ambulatory Visit (INDEPENDENT_AMBULATORY_CARE_PROVIDER_SITE_OTHER): Payer: Medicaid Other | Admitting: Physician Assistant

## 2014-11-23 ENCOUNTER — Encounter: Payer: Self-pay | Admitting: Physician Assistant

## 2014-11-23 VITALS — BP 132/65 | HR 101 | Wt 271.7 lb

## 2014-11-23 DIAGNOSIS — Z3492 Encounter for supervision of normal pregnancy, unspecified, second trimester: Secondary | ICD-10-CM

## 2014-11-23 DIAGNOSIS — R519 Headache, unspecified: Secondary | ICD-10-CM

## 2014-11-23 DIAGNOSIS — R51 Headache: Secondary | ICD-10-CM

## 2014-11-23 DIAGNOSIS — O9989 Other specified diseases and conditions complicating pregnancy, childbirth and the puerperium: Secondary | ICD-10-CM

## 2014-11-23 LAB — POCT URINALYSIS DIP (DEVICE)
Bilirubin Urine: NEGATIVE
Glucose, UA: NEGATIVE mg/dL
Hgb urine dipstick: NEGATIVE
Ketones, ur: NEGATIVE mg/dL
Leukocytes, UA: NEGATIVE
Nitrite: NEGATIVE
Protein, ur: NEGATIVE mg/dL
Specific Gravity, Urine: 1.015 (ref 1.005–1.030)
Urobilinogen, UA: 1 mg/dL (ref 0.0–1.0)
pH: 6.5 (ref 5.0–8.0)

## 2014-11-23 MED ORDER — CYCLOBENZAPRINE HCL 10 MG PO TABS: 10.0000 mg | ORAL_TABLET | Freq: Three times a day (TID) | ORAL | Status: DC | PRN

## 2014-11-23 NOTE — Progress Notes (Signed)
C/o SOB when walking any distance or when stands along time. C/o pain lower back once in a while. Oxygen saturation checked=100%.

## 2014-11-23 NOTE — Patient Instructions (Signed)
Second Trimester of Pregnancy The second trimester is from week 13 through week 28, months 4 through 6. The second trimester is often a time when you feel your best. Your body has also adjusted to being pregnant, and you begin to feel better physically. Usually, morning sickness has lessened or quit completely, you may have more energy, and you may have an increase in appetite. The second trimester is also a time when the fetus is growing rapidly. At the end of the sixth month, the fetus is about 9 inches long and weighs about 1 pounds. You will likely begin to feel the baby move (quickening) between 18 and 20 weeks of the pregnancy. BODY CHANGES Your body goes through many changes during pregnancy. The changes vary from woman to woman.   Your weight will continue to increase. You will notice your lower abdomen bulging out.  You may begin to get stretch marks on your hips, abdomen, and breasts.  You may develop headaches that can be relieved by medicines approved by your health care provider.  You may urinate more often because the fetus is pressing on your bladder.  You may develop or continue to have heartburn as a result of your pregnancy.  You may develop constipation because certain hormones are causing the muscles that push waste through your intestines to slow down.  You may develop hemorrhoids or swollen, bulging veins (varicose veins).  You may have back pain because of the weight gain and pregnancy hormones relaxing your joints between the bones in your pelvis and as a result of a shift in weight and the muscles that support your balance.  Your breasts will continue to grow and be tender.  Your gums may bleed and may be sensitive to brushing and flossing.  Dark spots or blotches (chloasma, mask of pregnancy) may develop on your face. This will likely fade after the baby is born.  A dark line from your belly button to the pubic area (linea nigra) may appear. This will likely fade  after the baby is born.  You may have changes in your hair. These can include thickening of your hair, rapid growth, and changes in texture. Some women also have hair loss during or after pregnancy, or hair that feels dry or thin. Your hair will most likely return to normal after your baby is born. WHAT TO EXPECT AT YOUR PRENATAL VISITS During a routine prenatal visit:  You will be weighed to make sure you and the fetus are growing normally.  Your blood pressure will be taken.  Your abdomen will be measured to track your baby's growth.  The fetal heartbeat will be listened to.  Any test results from the previous visit will be discussed. Your health care provider may ask you:  How you are feeling.  If you are feeling the baby move.  If you have had any abnormal symptoms, such as leaking fluid, bleeding, severe headaches, or abdominal cramping.  If you have any questions. Other tests that may be performed during your second trimester include:  Blood tests that check for:  Low iron levels (anemia).  Gestational diabetes (between 24 and 28 weeks).  Rh antibodies.  Urine tests to check for infections, diabetes, or protein in the urine.  An ultrasound to confirm the proper growth and development of the baby.  An amniocentesis to check for possible genetic problems.  Fetal screens for spina bifida and Down syndrome. HOME CARE INSTRUCTIONS   Avoid all smoking, herbs, alcohol, and unprescribed   drugs. These chemicals affect the formation and growth of the baby.  Follow your health care provider's instructions regarding medicine use. There are medicines that are either safe or unsafe to take during pregnancy.  Exercise only as directed by your health care provider. Experiencing uterine cramps is a good sign to stop exercising.  Continue to eat regular, healthy meals.  Wear a good support bra for breast tenderness.  Do not use hot tubs, steam rooms, or saunas.  Wear your  seat belt at all times when driving.  Avoid raw meat, uncooked cheese, cat litter boxes, and soil used by cats. These carry germs that can cause birth defects in the baby.  Take your prenatal vitamins.  Try taking a stool softener (if your health care provider approves) if you develop constipation. Eat more high-fiber foods, such as fresh vegetables or fruit and whole grains. Drink plenty of fluids to keep your urine clear or pale yellow.  Take warm sitz baths to soothe any pain or discomfort caused by hemorrhoids. Use hemorrhoid cream if your health care provider approves.  If you develop varicose veins, wear support hose. Elevate your feet for 15 minutes, 3-4 times a day. Limit salt in your diet.  Avoid heavy lifting, wear low heel shoes, and practice good posture.  Rest with your legs elevated if you have leg cramps or low back pain.  Visit your dentist if you have not gone yet during your pregnancy. Use a soft toothbrush to brush your teeth and be gentle when you floss.  A sexual relationship may be continued unless your health care provider directs you otherwise.  Continue to go to all your prenatal visits as directed by your health care provider. SEEK MEDICAL CARE IF:   You have dizziness.  You have mild pelvic cramps, pelvic pressure, or nagging pain in the abdominal area.  You have persistent nausea, vomiting, or diarrhea.  You have a bad smelling vaginal discharge.  You have pain with urination. SEEK IMMEDIATE MEDICAL CARE IF:   You have a fever.  You are leaking fluid from your vagina.  You have spotting or bleeding from your vagina.  You have severe abdominal cramping or pain.  You have rapid weight gain or loss.  You have shortness of breath with chest pain.  You notice sudden or extreme swelling of your face, hands, ankles, feet, or legs.  You have not felt your baby move in over an hour.  You have severe headaches that do not go away with  medicine.  You have vision changes. Document Released: 07/14/2001 Document Revised: 07/25/2013 Document Reviewed: 09/20/2012 ExitCare Patient Information 2015 [employer]. This information is not intended to replace advice given to you by your health care provider. Make sure you discuss any questions you have with your health care provider.  

## 2014-11-23 NOTE — Progress Notes (Signed)
25 weeks, stable.  Endorses good fetal movement.  Denies LOF, VB, dysuria.  Does have HA and tooth pain.  Uses Tylenol which is not helpful.  Flexeril for HA Go to dentist for tooth RTC 3 weeks for 28 week appt

## 2014-11-26 ENCOUNTER — Encounter: Payer: Self-pay | Admitting: Physician Assistant

## 2014-11-27 ENCOUNTER — Other Ambulatory Visit: Payer: Self-pay | Admitting: General Practice

## 2014-11-27 DIAGNOSIS — K0889 Other specified disorders of teeth and supporting structures: Secondary | ICD-10-CM

## 2014-11-27 NOTE — Progress Notes (Unsigned)
Dental clinic needs referral for patient. Referral placed per chart review

## 2014-12-04 ENCOUNTER — Emergency Department (HOSPITAL_COMMUNITY)
Admission: EM | Admit: 2014-12-04 | Discharge: 2014-12-04 | Disposition: A | Discharge status: Home / Self Care | Payer: Medicaid Other | Source: Home / Self Care

## 2014-12-04 ENCOUNTER — Encounter (HOSPITAL_COMMUNITY): Payer: Self-pay | Admitting: *Deleted

## 2014-12-04 DIAGNOSIS — K029 Dental caries, unspecified: Secondary | ICD-10-CM

## 2014-12-04 MED ORDER — PENICILLIN V POTASSIUM 500 MG PO TABS: 500.0000 mg | ORAL_TABLET | Freq: Four times a day (QID) | ORAL | Status: AC

## 2014-12-04 NOTE — Discharge Instructions (Signed)
Take medicine as prescribed, see your dentist as soon as possible °

## 2014-12-04 NOTE — ED Provider Notes (Signed)
CSN: [id number]     Arrival date & time 12/04/14  1442 History   None    Chief Complaint  Patient presents with  . Dental Pain   (Consider location/radiation/quality/duration/timing/severity/associated sxs/prior Treatment) Patient is a 29 y.o. female presenting with tooth pain. The history is provided by the patient.  Dental Pain Location:  Upper Upper teeth location:  5/RU 1st bicuspid Quality:  Throbbing Severity:  Mild Onset quality:  Gradual Duration:  1 week Progression:  Unchanged Chronicity:  New Context: dental caries   Relieved by:  None tried Worsened by:  Nothing tried Ineffective treatments:  None tried Associated symptoms: no facial pain, no facial swelling, no fever and no gum swelling   Risk factors: lack of dental care   Risk factors comment:  26wk preg.   Past Medical History  Diagnosis Date  . Asthma    Past Surgical History  Procedure Laterality Date  . Dilation and curettage of uterus     Family History  Problem Relation Age of Onset  . Diabetes Mother   . Hypertension Mother   . Hyperlipidemia Mother   . Heart disease Mother   . Diabetes Sister   . Diabetes Brother    History  Substance Use Topics  . Smoking status: Former Smoker    Types: Cigarettes  . Smokeless tobacco: Not on file  . Alcohol Use: No   OB History    Gravida Para Term Preterm AB TAB SAB Ectopic Multiple Living   3 1 1  1  1   1      Review of Systems  Constitutional: Negative for fever.  HENT: Positive for dental problem. Negative for facial swelling.   All other systems reviewed and are negative.   Allergies  Review of patient's allergies indicates no known allergies.  Home Medications   Prior to Admission medications   Medication Sig Start Date End Date Taking? Authorizing Provider  cyclobenzaprine (FLEXERIL) 10 MG tablet Take 1 tablet (10 mg total) by mouth every 8 (eight) hours as needed (headache). Can break in half 11/23/14   Paticia Stack, PA-C    penicillin v potassium (VEETID) 500 MG tablet Take 1 tablet (500 mg total) by mouth 4 (four) times daily. 12/04/14 12/11/14  Billy Fischer, MD  Prenatal Multivit-Min-Fe-FA (PRENATAL VITAMINS) 0.8 MG tablet Take 1 tablet by mouth daily. 08/28/14   Truett Mainland, DO  valACYclovir (VALTREX) 500 MG tablet Take 1 tablet (500 mg total) by mouth 2 (two) times daily. Patient not taking: Reported on 09/26/2014 08/28/14   Tanna Savoy Stinson, DO   BP 128/86 mmHg  Pulse 72  Temp(Src) 98.6 F (37 C) (Oral)  Resp 18  SpO2 100%  LMP 06/01/2014 Physical Exam  Constitutional: She is oriented to person, place, and time. She appears well-developed and well-nourished.  HENT:  Head: Normocephalic.  Right Ear: External ear normal.  Left Ear: External ear normal.  Mouth/Throat: Oropharynx is clear and moist.    Neck: Normal range of motion.  Lymphadenopathy:    She has no cervical adenopathy.  Neurological: She is alert and oriented to person, place, and time.  Skin: Skin is warm and dry.  Nursing note and vitals reviewed.   ED Course  Procedures (including critical care time) Labs Review Labs Reviewed - No data to display  Imaging Review No results found.   MDM   1. Pain due to dental caries       Billy Fischer, MD 12/06/14 [phone]

## 2014-12-04 NOTE — ED Notes (Signed)
Pt  Is  Pregnant   She  Reports  Symptoms  Of   A  Toothache       For  About  1  Week     She    Is  In no  Acute  Severe   Distress     She     Has  An  appty  Next  Week  With a  Dentist  -   She  Has  hemmorhiods  As  Well

## 2014-12-14 ENCOUNTER — Ambulatory Visit (INDEPENDENT_AMBULATORY_CARE_PROVIDER_SITE_OTHER): Payer: Medicaid Other | Admitting: Family Medicine

## 2014-12-14 ENCOUNTER — Encounter: Payer: Self-pay | Admitting: General Practice

## 2014-12-14 VITALS — BP 120/71 | HR 87 | Temp 97.9°F | Wt 268.7 lb

## 2014-12-14 DIAGNOSIS — J4520 Mild intermittent asthma, uncomplicated: Secondary | ICD-10-CM

## 2014-12-14 DIAGNOSIS — Z3482 Encounter for supervision of other normal pregnancy, second trimester: Secondary | ICD-10-CM

## 2014-12-14 LAB — POCT URINALYSIS DIP (DEVICE)
Bilirubin Urine: NEGATIVE
Glucose, UA: NEGATIVE mg/dL
Hgb urine dipstick: NEGATIVE
Ketones, ur: NEGATIVE mg/dL
Leukocytes, UA: NEGATIVE
Nitrite: NEGATIVE
Protein, ur: NEGATIVE mg/dL
Specific Gravity, Urine: 1.02 (ref 1.005–1.030)
Urobilinogen, UA: 0.2 mg/dL (ref 0.0–1.0)
pH: 6 (ref 5.0–8.0)

## 2014-12-14 MED ORDER — ALBUTEROL SULFATE HFA 108 (90 BASE) MCG/ACT IN AERS: 2.0000 | INHALATION_SPRAY | Freq: Four times a day (QID) | RESPIRATORY_TRACT | Status: DC | PRN

## 2014-12-14 NOTE — Patient Instructions (Signed)
Intrauterine Device Insertion Most often, an intrauterine device (IUD) is inserted into the uterus to prevent pregnancy. There are 2 types of IUDs available:  Copper IUD--This type of IUD creates an environment that is not favorable to sperm survival. The mechanism of action of the copper IUD is not known for certain. It can stay in place for 10 years.  Hormone IUD--This type of IUD contains the hormone progestin (synthetic progesterone). The progestin thickens the cervical mucus and prevents sperm from entering the uterus, and it also thins the uterine lining. There is no evidence that the hormone IUD prevents implantation. One hormone IUD can stay in place for up to 5 years, and a different hormone IUD can stay in place for up to 3 years. An IUD is the most cost-effective birth control if left in place for the full duration. It may be removed at any time. LET YOUR HEALTH CARE PROVIDER KNOW ABOUT:  Any allergies you have.  All medicines you are taking, including vitamins, herbs, eye drops, creams, and over-the-counter medicines.  Previous problems you or members of your family have had with the use of anesthetics.  Any blood disorders you have.  Previous surgeries you have had.  Possibility of pregnancy.  Medical conditions you have. RISKS AND COMPLICATIONS  Generally, intrauterine device insertion is a safe procedure. However, as with any procedure, complications can occur. Possible complications include:  Accidental puncture (perforation) of the uterus.  Accidental placement of the IUD either in the muscle layer of the uterus (myometrium) or outside the uterus. If this happens, the IUD can be found essentially floating around the bowels and must be taken out surgically.  The IUD may fall out of the uterus (expulsion). This is more common in women who have recently had a child.   Pregnancy in the fallopian tube (ectopic).  Pelvic inflammatory disease (PID), which is infection of  the uterus and fallopian tubes. The risk of PID is slightly increased in the first 20 days after the IUD is placed, but the overall risk is still very low. BEFORE THE PROCEDURE  Schedule the IUD insertion for when you will have your menstrual period or right after, to make sure you are not pregnant. Placement of the IUD is better tolerated shortly after a menstrual cycle.  You may need to take tests or be examined to make sure you are not pregnant.  You may be required to take a pregnancy test.  You may be required to get checked for sexually transmitted infections (STIs) prior to placement. Placing an IUD in someone who has an infection can make the infection worse.  You may be given a pain reliever to take 1 or 2 hours before the procedure.  An exam will be performed to determine the size and position of your uterus.  Ask your health care provider about changing or stopping your regular medicines. PROCEDURE   A tool (speculum) is placed in the vagina. This allows your health care provider to see the lower part of the uterus (cervix).  The cervix is prepped with a medicine that lowers the risk of infection.  You may be given a medicine to numb each side of the cervix (intracervical or paracervical block). This is used to block and control any discomfort with insertion.  A tool (uterine sound) is inserted into the uterus to determine the length of the uterine cavity and the direction the uterus may be tilted.  A slim instrument (IUD inserter) is inserted through the cervical   canal and into your uterus.  The IUD is placed in the uterine cavity and the insertion device is removed.  The nylon string that is attached to the IUD and used for eventual IUD removal is trimmed. It is trimmed so that it lays high in the vagina, just outside the cervix. AFTER THE PROCEDURE  You may have bleeding after the procedure. This is normal. It varies from light spotting for a few days to menstrual-like  bleeding.  You may have mild cramping. Document Released: 03/18/2011 Document Revised: 05/10/2013 Document Reviewed: 01/08/2013 ExitCare Patient Information 2015 [employer]. This information is not intended to replace advice given to you by your health care provider. Make sure you discuss any questions you have with your health care provider.  

## 2014-12-14 NOTE — Progress Notes (Signed)
Patient needs 1 hr gtt and 28 week labs

## 2014-12-14 NOTE — Addendum Note (Signed)
Addended by: Nila Nephew on: 12/14/2014 12:01 PM   Modules accepted: Orders

## 2014-12-14 NOTE — Progress Notes (Addendum)
Patient is 29 y.o. G3P1011 [redacted]w[redacted]d.  +FM, denies LOF, VB, contractions, vaginal discharge.  Overall feeling well. - discussed contraception - 28 wk labs to be drawn this week, not today - growth sono ordered as anatomy was incomplete, measured large for dates as last visit but currently this visit measured appropriately.   - reported some shortness of breath to RN, did not discuss with pt.  Stated she had hx of asthma as child and occasionally feels that way.  rx albuterol.  Please discuss at next visit if applicable

## 2014-12-18 ENCOUNTER — Other Ambulatory Visit: Payer: Medicaid Other

## 2014-12-18 DIAGNOSIS — Z3482 Encounter for supervision of other normal pregnancy, second trimester: Secondary | ICD-10-CM

## 2014-12-18 LAB — CBC
HCT: 28.6 % — ABNORMAL LOW (ref 36.0–46.0)
Hemoglobin: 9.1 g/dL — ABNORMAL LOW (ref 12.0–15.0)
MCH: 22.9 pg — ABNORMAL LOW (ref 26.0–34.0)
MCHC: 31.8 g/dL (ref 30.0–36.0)
MCV: 71.9 fL — ABNORMAL LOW (ref 78.0–100.0)
MPV: 8.2 fL — ABNORMAL LOW (ref 8.6–12.4)
Platelets: 245 10*3/uL (ref 150–400)
RBC: 3.98 MIL/uL (ref 3.87–5.11)
RDW: 16.8 % — ABNORMAL HIGH (ref 11.5–15.5)
WBC: 9.4 10*3/uL (ref 4.0–10.5)

## 2014-12-19 LAB — GLUCOSE TOLERANCE, 1 HOUR (50G) W/O FASTING: Glucose, 1 Hour GTT: 139 mg/dL (ref 70–140)

## 2014-12-19 LAB — RPR

## 2014-12-19 LAB — HIV ANTIBODY (ROUTINE TESTING W REFLEX): HIV 1&2 Ab, 4th Generation: NONREACTIVE

## 2014-12-21 NOTE — Progress Notes (Signed)
Quick Note:  Patient's 1 hr GTT was elevated. Please call patient to have her come in for a 3 hr GTT. ______ 

## 2014-12-25 ENCOUNTER — Ambulatory Visit (HOSPITAL_COMMUNITY)
Admission: RE | Admit: 2014-12-25 | Discharge: 2014-12-25 | Disposition: A | Discharge status: Home / Self Care | Payer: Medicaid Other | Source: Ambulatory Visit | Attending: Family Medicine | Admitting: Family Medicine

## 2014-12-25 DIAGNOSIS — Z3482 Encounter for supervision of other normal pregnancy, second trimester: Secondary | ICD-10-CM | POA: Diagnosis present

## 2015-01-01 ENCOUNTER — Other Ambulatory Visit: Payer: Medicaid Other

## 2015-01-03 ENCOUNTER — Ambulatory Visit (INDEPENDENT_AMBULATORY_CARE_PROVIDER_SITE_OTHER): Payer: Medicaid Other | Admitting: Family

## 2015-01-03 VITALS — BP 130/75 | HR 103 | Temp 98.2°F | Wt 271.4 lb

## 2015-01-03 DIAGNOSIS — J4520 Mild intermittent asthma, uncomplicated: Secondary | ICD-10-CM

## 2015-01-03 DIAGNOSIS — Z3482 Encounter for supervision of other normal pregnancy, second trimester: Secondary | ICD-10-CM

## 2015-01-03 LAB — POCT URINALYSIS DIP (DEVICE)
Bilirubin Urine: NEGATIVE
Glucose, UA: NEGATIVE mg/dL
Hgb urine dipstick: NEGATIVE
Ketones, ur: NEGATIVE mg/dL
Leukocytes, UA: NEGATIVE
Nitrite: NEGATIVE
Protein, ur: NEGATIVE mg/dL
Specific Gravity, Urine: >=1.03 (ref 1.005–1.030)
Urobilinogen, UA: 0.2 mg/dL (ref 0.0–1.0)
pH: 6.5 (ref 5.0–8.0)

## 2015-01-03 MED ORDER — ALBUTEROL SULFATE HFA 108 (90 BASE) MCG/ACT IN AERS: 2.0000 | INHALATION_SPRAY | Freq: Four times a day (QID) | RESPIRATORY_TRACT | Status: DC | PRN

## 2015-01-03 NOTE — Progress Notes (Signed)
Patient coming in tomorrow for 3hr gtt.  Patient states albuterol inhaler was $60 at [facility]-- called [place] outpatient pharmacy and was informed it would be $3 or free likely-- RX to be sent there.

## 2015-01-03 NOTE — Progress Notes (Signed)
Reports doing well.  Plans to do 3 hr test tomorrow.  Discussed contraception options > desires mirena.  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  Plans to graduate in December.

## 2015-01-04 ENCOUNTER — Other Ambulatory Visit: Payer: Medicaid Other

## 2015-01-07 ENCOUNTER — Inpatient Hospital Stay (HOSPITAL_COMMUNITY)
Admission: AD | Admit: 2015-01-07 | Discharge: 2015-01-07 | Disposition: A | Discharge status: Home / Self Care | Payer: Medicaid Other | Source: Ambulatory Visit | Attending: Obstetrics & Gynecology | Admitting: Obstetrics & Gynecology

## 2015-01-07 ENCOUNTER — Encounter (HOSPITAL_COMMUNITY): Payer: Self-pay

## 2015-01-07 DIAGNOSIS — R109 Unspecified abdominal pain: Secondary | ICD-10-CM | POA: Diagnosis not present

## 2015-01-07 DIAGNOSIS — Z87891 Personal history of nicotine dependence: Secondary | ICD-10-CM | POA: Diagnosis not present

## 2015-01-07 DIAGNOSIS — Z3A31 31 weeks gestation of pregnancy: Secondary | ICD-10-CM | POA: Insufficient documentation

## 2015-01-07 DIAGNOSIS — R102 Pelvic and perineal pain: Secondary | ICD-10-CM

## 2015-01-07 DIAGNOSIS — O9989 Other specified diseases and conditions complicating pregnancy, childbirth and the puerperium: Secondary | ICD-10-CM | POA: Insufficient documentation

## 2015-01-07 DIAGNOSIS — O26899 Other specified pregnancy related conditions, unspecified trimester: Secondary | ICD-10-CM

## 2015-01-07 LAB — URINALYSIS, ROUTINE W REFLEX MICROSCOPIC
Bilirubin Urine: NEGATIVE
Glucose, UA: NEGATIVE mg/dL
Hgb urine dipstick: NEGATIVE
Ketones, ur: NEGATIVE mg/dL
Leukocytes, UA: NEGATIVE
Nitrite: NEGATIVE
Protein, ur: NEGATIVE mg/dL
Specific Gravity, Urine: 1.01 (ref 1.005–1.030)
Urobilinogen, UA: 0.2 mg/dL (ref 0.0–1.0)
pH: 5.5 (ref 5.0–8.0)

## 2015-01-07 MED ORDER — CYCLOBENZAPRINE HCL 5 MG PO TABS: 5.0000 mg | ORAL_TABLET | Freq: Three times a day (TID) | ORAL | Status: DC | PRN

## 2015-01-07 MED ORDER — CYCLOBENZAPRINE HCL 5 MG PO TABS
5.0000 mg | ORAL_TABLET | Freq: Once | ORAL | Status: AC
  Administered 2015-01-07: 5 mg via ORAL
  Filled 2015-01-07: qty 1

## 2015-01-07 NOTE — MAU Provider Note (Signed)
History     CSN: [id number]  Arrival date and time: 01/07/15 1603   None     Chief Complaint  Patient presents with  . Pelvic Pain   HPI   [name] is a 29 y.o. female G3P1011 at [redacted]w[redacted]d who presents with pelvic pain. The pain started about 1 week ago and has remained the same in severity. The pelvic pain is worse when she walks and when she changes positions. She has not tried any tylenol over the counter because tylenol causes her to have lower back pain. She bought a pregnancy support belt however has not tried wearing it; she plans to start.    + fetal movement Denies leaking of fluid or vaginal bleeding   OB History    Gravida Para Term Preterm AB TAB SAB Ectopic Multiple Living   3 1 1  1  1   1       Past Medical History  Diagnosis Date  . Asthma     Past Surgical History  Procedure Laterality Date  . Dilation and curettage of uterus      Family History  Problem Relation Age of Onset  . Diabetes Mother   . Hypertension Mother   . Hyperlipidemia Mother   . Heart disease Mother   . Diabetes Sister   . Diabetes Brother     History  Substance Use Topics  . Smoking status: Former Smoker    Types: Cigarettes  . Smokeless tobacco: Not on file  . Alcohol Use: No    Allergies: No Known Allergies  Prescriptions prior to admission  Medication Sig Dispense Refill Last Dose  . albuterol (PROVENTIL HFA;VENTOLIN HFA) 108 (90 BASE) MCG/ACT inhaler Inhale 2 puffs into the lungs every 6 (six) hours as needed for wheezing or shortness of breath. (Patient taking differently: Inhale 2 puffs into the lungs every 6 (six) hours as needed for shortness of breath (emergency medication for shortness of breath). ) 1 Inhaler 2 Past Week at Unknown time  . Prenatal Multivit-Min-Fe-FA (PRENATAL VITAMINS) 0.8 MG tablet Take 1 tablet by mouth daily. (Patient taking differently: Take 2 tablets by mouth daily. ) 30 tablet 12 Past Week at Unknown time  . cyclobenzaprine  (FLEXERIL) 10 MG tablet Take 1 tablet (10 mg total) by mouth every 8 (eight) hours as needed (headache). Can break in half (Patient not taking: Reported on 01/07/2015) 20 tablet 0 Not Taking at Unknown time  . valACYclovir (VALTREX) 500 MG tablet Take 1 tablet (500 mg total) by mouth 2 (two) times daily. (Patient not taking: Reported on 01/07/2015) 60 tablet 1 Not Taking at Unknown time   Results for orders placed or performed during the hospital encounter of 01/07/15 (from the past 48 hour(s))  Urinalysis, Routine w reflex microscopic (not at [facility])     Status: None   Collection Time: 01/07/15  4:24 PM  Result Value Ref Range   Color, Urine YELLOW YELLOW   APPearance CLEAR CLEAR   Specific Gravity, Urine 1.010 1.005 - 1.030   pH 5.5 5.0 - 8.0   Glucose, UA NEGATIVE NEGATIVE mg/dL   Hgb urine dipstick NEGATIVE NEGATIVE   Bilirubin Urine NEGATIVE NEGATIVE   Ketones, ur NEGATIVE NEGATIVE mg/dL   Protein, ur NEGATIVE NEGATIVE mg/dL   Urobilinogen, UA 0.2 0.0 - 1.0 mg/dL   Nitrite NEGATIVE NEGATIVE   Leukocytes, UA NEGATIVE NEGATIVE    Comment: MICROSCOPIC NOT DONE ON URINES WITH NEGATIVE PROTEIN, BLOOD, LEUKOCYTES, NITRITE, OR GLUCOSE <1000 mg/dL.  Review of Systems  Gastrointestinal: Positive for abdominal pain.  Genitourinary: Negative for dysuria and urgency.   Physical Exam   Blood pressure 131/68, pulse 89, temperature 98.3 F (36.8 C), temperature source Oral, resp. rate 20, height 5' 6.5" (1.689 m), weight 124.739 kg (275 lb), last menstrual period 06/01/2014.  Physical Exam  Constitutional: She is oriented to person, place, and time. She appears well-developed and well-nourished. No distress.  HENT:  Head: Normocephalic.  Eyes: Pupils are equal, round, and reactive to light.  Respiratory: Effort normal.  GI: Soft. There is no tenderness.  Musculoskeletal: Normal range of motion.  Neurological: She is alert and oriented to person, place, and time.  Skin: Skin is warm. She is  not diaphoretic.  Psychiatric: She has a normal mood and affect.     Fetal Tracing: Baseline: 135 bpm Variability: Moderate  Accelerations: 15x15 Decelerations: none Toco: quiet    Dilation: Closed Exam by:: JRasch, NP  MAU Course  Procedures None   MDM   Assessment and Plan   A:  1. Abdominal pain in pregnancy   2. Pain of round ligament affecting pregnancy, antepartum     P:  Discharge home in stable condition Pregnancy support belt recommended Return to MAU if symptoms worsen Follow up with OB as scheduled Kick counts Preterm labor precautions

## 2015-01-07 NOTE — MAU Note (Signed)
For the past wk, has experienced semi severe pelvic pain.

## 2015-01-07 NOTE — MAU Note (Signed)
Pt states here for suprapubic pain that began one week ago. Denies uti s/s. No bleeding or discharge.

## 2015-01-07 NOTE — Discharge Instructions (Signed)

## 2015-01-08 ENCOUNTER — Other Ambulatory Visit: Payer: Medicaid Other

## 2015-01-08 DIAGNOSIS — R7309 Other abnormal glucose: Secondary | ICD-10-CM

## 2015-01-09 LAB — GLUCOSE TOLERANCE, 3 HOURS
Glucose Tolerance, 1 hour: 170 mg/dL (ref 70–189)
Glucose Tolerance, 2 hour: 152 mg/dL (ref 70–164)
Glucose Tolerance, Fasting: 87 mg/dL (ref 70–104)
Glucose, GTT - 3 Hour: 79 mg/dL (ref 70–144)

## 2015-01-17 ENCOUNTER — Ambulatory Visit (INDEPENDENT_AMBULATORY_CARE_PROVIDER_SITE_OTHER): Payer: Medicaid Other | Admitting: Family

## 2015-01-17 VITALS — BP 112/60 | HR 99 | Temp 98.6°F | Wt 274.2 lb

## 2015-01-17 DIAGNOSIS — Z3493 Encounter for supervision of normal pregnancy, unspecified, third trimester: Secondary | ICD-10-CM

## 2015-01-17 LAB — POCT URINALYSIS DIP (DEVICE)
Bilirubin Urine: NEGATIVE
Glucose, UA: NEGATIVE mg/dL
Hgb urine dipstick: NEGATIVE
Ketones, ur: NEGATIVE mg/dL
Nitrite: NEGATIVE
Protein, ur: NEGATIVE mg/dL
Specific Gravity, Urine: 1.015 (ref 1.005–1.030)
Urobilinogen, UA: 0.2 mg/dL (ref 0.0–1.0)
pH: 6.5 (ref 5.0–8.0)

## 2015-01-17 MED ORDER — VALACYCLOVIR HCL 500 MG PO TABS: 500.0000 mg | ORAL_TABLET | Freq: Two times a day (BID) | ORAL | Status: DC

## 2015-01-17 NOTE — Progress Notes (Signed)
Reviewed 3 hr results - nml.  Doing well; no questions or concerns.  Refilled valtrex.

## 2015-01-24 ENCOUNTER — Telehealth: Payer: Self-pay

## 2015-01-24 NOTE — Telephone Encounter (Signed)
Patient called asking if we had her immunization record. Called patient and informed her we have record of her flu vaccine and if she would like a copy she may come into clinic and sign ROI. Patient verbalized understanding and gratitude. No further questions or concerns.

## 2015-01-30 ENCOUNTER — Encounter: Payer: Medicaid Other | Admitting: Family Medicine

## 2015-02-01 ENCOUNTER — Encounter: Payer: Medicaid Other | Admitting: Obstetrics & Gynecology

## 2015-02-08 ENCOUNTER — Encounter (HOSPITAL_COMMUNITY): Payer: Self-pay | Admitting: *Deleted

## 2015-02-08 ENCOUNTER — Inpatient Hospital Stay (HOSPITAL_COMMUNITY)
Admission: AD | Admit: 2015-02-08 | Discharge: 2015-02-08 | Disposition: A | Discharge status: Home / Self Care | Payer: Medicaid Other | Source: Ambulatory Visit | Attending: Obstetrics and Gynecology | Admitting: Obstetrics and Gynecology

## 2015-02-08 DIAGNOSIS — Z3A36 36 weeks gestation of pregnancy: Secondary | ICD-10-CM | POA: Insufficient documentation

## 2015-02-08 DIAGNOSIS — O368130 Decreased fetal movements, third trimester, not applicable or unspecified: Secondary | ICD-10-CM | POA: Insufficient documentation

## 2015-02-08 HISTORY — DX: Trichomonal vulvovaginitis: A59.01

## 2015-02-08 NOTE — MAU Note (Signed)
States she just felt the baby move a minute ago, but prior to that, no movement felt since last night. Thought her urine looked pinkish last night and had a scant smear of pink on tissue when she wiped this AM. Last intercourse night before last.

## 2015-02-08 NOTE — Discharge Instructions (Signed)
Pain Relief During Labor and Delivery Everyone experiences pain differently, but labor causes severe pain for many women. The amount of pain you experience during labor and delivery depends on your pain tolerance, contraction strength, and your baby's size and position. There are many ways to prepare for and deal with the pain, including:   Taking prenatal classes to learn about labor and delivery. The more informed you are, the less anxious and afraid you may be. This can help lessen the pain.  Taking pain-relieving medicine during labor and delivery.  Learning breathing and relaxation techniques.  Taking a shower or bath.  Getting massaged.  Changing positions.  Placing an ice pack on your back. Discuss your pain control options with your health care provider during your prenatal visits.  WHAT ARE THE TWO TYPES OF PAIN-RELIEVING MEDICINES? 1. Analgesics. These are medicines that decrease pain without total loss of feeling or muscle movement. 2. Anesthetics. These are medicines that block all feeling, including pain. There can be minor side effects of both types, such as nausea, trouble concentrating, becoming sleepy, and lowering the heart rate of the baby. However, health care providers are careful to give doses that will not seriously affect the baby.  WHAT ARE THE SPECIFIC TYPES OF ANALGESICS AND ANESTHETICS? Systemic Analgesic Systemic pain medicines affect your whole body rather than focusing pain relief on the area of your body experiencing pain. This type of medicine is given either through an IV tube in your vein or by a shot (injection) into your muscle. This medicine will lessen your pain but will not stop it completely. It may also make you sleepy, but it will not make you lose consciousness.  Local Anesthetic Local anesthetic isused tonumb a small area of your body. The medicine is injected into the area of nerves that carry feeling to the vagina, vulva, or the area between  the vagina and anus (perineum).  General Anesthetic This type of medicine causes you to lose consciousness so you do not feel pain. It is usually used only in emergency situations during labor. It is given through an IV tube or face mask. Paracervical Block A paracervical block is a form of local anesthesia given during labor. Numbing medicine is injected into the right and left sides of the cervix and vagina. It helps to lessen the pain caused by contractions and stretching of the cervix. It may have to be given more than once.  Pudendal Block A pudendal block is another form of local anesthesia. It is used to relieve the pain associated with pushing or stretching of the perineum at the time of delivery. An injection is given deep through the vaginal wall into the pudendal nerve in the pelvis, numbing the perineum.  Epidural Anesthetic An epidural is an injection of numbing medicine given in the lower back and into the epidural space near your spinal cord. The epidural numbs the lower half of your body. You may be able to move your legs but will not be allowed to walk. Epidurals can be used for labor, delivery, or cesarean deliveries.  To prevent the medicine from wearing off, a small tube (catheter) may be threaded into the epidural space and taped in place to prevent it from slipping out. Medicine can then be given continuously in small doses through the tube until you deliver. Spinal Block A spinal block is similar to an epidural, but the medicine is injected into the spinal fluid, not the epidural space. A spinal block is only given  once. It starts to relieve pain quickly but lasts only 1-2 hours. Spinal blocks can also be used for cesarean deliveries.  Combined Spinal-Epidural Block Combined spinal-epidural blocks combine the benefits of both the spinal and epidural blocks. The spinal part acts quickly to relieve pain and the epidural provides continuous pain relief. Hydrotherapy Immersion in  warm water during labor may provide comfort and relaxation. It may also help to lessen pain, the use of anesthesia, and the length of labor. However, immersion in water during the delivery (water birth) may have some risk involved and studies to determine safety and risks are ongoing. If you are a healthy woman who is expecting an uncomplicated birth, talk with your health care provider to see if water birth is an option for you.  Document Released: 11/05/2008 Document Revised: 07/25/2013 Document Reviewed: 12/08/2012 [facility] Patient Information 2015 [place], [place]. This information is not intended to replace advice given to you by your health care provider. Make sure you discuss any questions you have with your health care provider.

## 2015-02-08 NOTE — MAU Provider Note (Signed)
History     CSN: [id number]  Arrival date and time: 02/08/15 8466   None     Chief Complaint  Patient presents with  . Decreased Fetal Movement   HPI [name] 29 y.o. [id number] @[redacted]w[redacted]d  presents to MAU complaining of decreased fetal movement.  She noticed movements last evening but then not again until just prior to monitors applied in MAU.  Baby has moved well throughout time here in MAU.  No further concerns.  Denies LOF, Dysuria, Vaginal bleeding but did have 1 spot of blood on toilet tissue upon wiping this am. None since.   OB History    Gravida Para Term Preterm AB TAB SAB Ectopic Multiple Living   3 1 1  1  1   1       Past Medical History  Diagnosis Date  . Asthma   . Trichomonas vaginitis     Past Surgical History  Procedure Laterality Date  . Dilation and curettage of uterus      Family History  Problem Relation Age of Onset  . Diabetes Mother   . Hypertension Mother   . Hyperlipidemia Mother   . Heart disease Mother   . Diabetes Sister   . Diabetes Brother     History  Substance Use Topics  . Smoking status: Former Smoker    Types: Cigarettes  . Smokeless tobacco: Never Used  . Alcohol Use: No    Allergies: No Known Allergies  Prescriptions prior to admission  Medication Sig Dispense Refill Last Dose  . albuterol (PROVENTIL HFA;VENTOLIN HFA) 108 (90 BASE) MCG/ACT inhaler Inhale 2 puffs into the lungs every 6 (six) hours as needed for wheezing or shortness of breath. (Patient taking differently: Inhale 2 puffs into the lungs every 6 (six) hours as needed for shortness of breath (emergency medication for shortness of breath). ) 1 Inhaler 2 Taking  . cyclobenzaprine (FLEXERIL) 5 MG tablet Take 1 tablet (5 mg total) by mouth 3 (three) times daily as needed for muscle spasms. 30 tablet 0 Taking  . penicillin v potassium (VEETID) 500 MG tablet Take 500 mg by mouth 4 (four) times daily.   Not Taking  . Prenatal Multivit-Min-Fe-FA (PRENATAL VITAMINS)  0.8 MG tablet Take 1 tablet by mouth daily. (Patient taking differently: Take 2 tablets by mouth daily. ) 30 tablet 12 Taking  . valACYclovir (VALTREX) 500 MG tablet Take 1 tablet (500 mg total) by mouth 2 (two) times daily. 60 tablet 1     ROS Pertinent ROS in HPI.  All other systems are negative.   Physical Exam   Blood pressure 133/73, pulse 98, temperature 98.3 F (36.8 C), temperature source Oral, resp. rate 18, last menstrual period 06/01/2014.  Physical Exam  Constitutional: She is oriented to person, place, and time. She appears well-developed and well-nourished. No distress.  HENT:  Head: Normocephalic and atraumatic.  Eyes: EOM are normal.  Neck: Normal range of motion.  Cardiovascular: Normal rate.   Respiratory: Effort normal. No respiratory distress.  Neurological: She is alert and oriented to person, place, and time.  Skin: Skin is warm and dry.  Psychiatric: She has a normal mood and affect.    MAU Course  Procedures  MDM NST reactive  Fetal Tracing: Baseline:135 Variability:mod Accelerations: 15 x 15 Decelerations:none Toco:none  No further complaint of fetal movement.  Baby moving well.   Assessment and Plan  A: Decreased fetal movement - resolved  P: Discharge to home Fetal kick counts  Continue PNC as  scheduled at [facility] Patient may return to MAU as needed or if her condition were to change or worsen   Paticia Stack 02/08/2015, 8:20 AM

## 2015-02-14 ENCOUNTER — Ambulatory Visit (INDEPENDENT_AMBULATORY_CARE_PROVIDER_SITE_OTHER): Payer: Medicaid Other | Admitting: Family Medicine

## 2015-02-14 VITALS — BP 127/64 | HR 97 | Wt 272.8 lb

## 2015-02-14 DIAGNOSIS — D573 Sickle-cell trait: Secondary | ICD-10-CM

## 2015-02-14 DIAGNOSIS — O99213 Obesity complicating pregnancy, third trimester: Secondary | ICD-10-CM

## 2015-02-14 DIAGNOSIS — Z3483 Encounter for supervision of other normal pregnancy, third trimester: Secondary | ICD-10-CM | POA: Diagnosis present

## 2015-02-14 DIAGNOSIS — E669 Obesity, unspecified: Secondary | ICD-10-CM

## 2015-02-14 LAB — OB RESULTS CONSOLE GBS: GBS: POSITIVE

## 2015-02-14 LAB — POCT URINALYSIS DIP (DEVICE)
Bilirubin Urine: NEGATIVE
Glucose, UA: 250 mg/dL — AB
Ketones, ur: NEGATIVE mg/dL
Leukocytes, UA: NEGATIVE
Nitrite: NEGATIVE
Protein, ur: NEGATIVE mg/dL
Specific Gravity, Urine: 1.02 (ref 1.005–1.030)
Urobilinogen, UA: 0.2 mg/dL (ref 0.0–1.0)
pH: 6.5 (ref 5.0–8.0)

## 2015-02-14 LAB — OB RESULTS CONSOLE GC/CHLAMYDIA
Chlamydia: NEGATIVE
Gonorrhea: NEGATIVE

## 2015-02-14 NOTE — Progress Notes (Signed)
Subjective:  [name] is a 29 y.o. G3P1011 at [redacted]w[redacted]d being seen today for ongoing prenatal care.  Patient reports no complaints.  Contractions: Not present.  Vag. Bleeding: None. Movement: Present. Denies leaking of fluid.   The following portions of the patient's history were reviewed and updated as appropriate: allergies, current medications, past family history, past medical history, past social history, past surgical history and problem list.   Objective:   Filed Vitals:   02/14/15 0901  BP: 127/64  Pulse: 97  Weight: 272 lb 12.8 oz (123.741 kg)    Fetal Status: Fetal Heart Rate (bpm): 152   Movement: Present     General:  Alert, oriented and cooperative. Patient is in no acute distress.  Skin: Skin is warm and dry. No rash noted.   Cardiovascular: Normal heart rate noted  Respiratory: Normal respiratory effort, no problems with respiration noted  Abdomen: Soft, gravid, appropriate for gestational age. Pain/Pressure: Present     Vaginal: Vag. Bleeding: None.       Cervix: Not evaluated        Extremities: Normal range of motion.  Edema: Trace  Mental Status: Normal mood and affect. Normal behavior. Normal judgment and thought content.   Urinalysis:      Assessment and Plan:  Pregnancy: G3P1011 at [redacted]w[redacted]d  1. Encounter for supervision of other normal pregnancy in third trimester- Last delivery at 38 weeks.  - GC/Chlamydia Probe Amp - Culture, beta strep (group b only) - SVE - external OS 3-4 Cm, internal 1.5cm  2. Sickle cell trait  3. Obesity in pregnancy, antepartum, third trimester  Term labor symptoms and general obstetric precautions including but not limited to vaginal bleeding, contractions, leaking of fluid and fetal movement were reviewed in detail with the patient.  Please refer to After Visit Summary for other counseling recommendations.   Caren Macadam, MD

## 2015-02-14 NOTE — Patient Instructions (Signed)
Third Trimester of Pregnancy The third trimester is from week 29 through week 42, months 7 through 9. The third trimester is a time when the fetus is growing rapidly. At the end of the ninth month, the fetus is about 20 inches in length and weighs 6-10 pounds.  BODY CHANGES Your body goes through many changes during pregnancy. The changes vary from woman to woman.   Your weight will continue to increase. You can expect to gain 25-35 pounds (11-16 kg) by the end of the pregnancy.  You may begin to get stretch marks on your hips, abdomen, and breasts.  You may urinate more often because the fetus is moving lower into your pelvis and pressing on your bladder.  You may develop or continue to have heartburn as a result of your pregnancy.  You may develop constipation because certain hormones are causing the muscles that push waste through your intestines to slow down.  You may develop hemorrhoids or swollen, bulging veins (varicose veins).  You may have pelvic pain because of the weight gain and pregnancy hormones relaxing your joints between the bones in your pelvis. Backaches may result from overexertion of the muscles supporting your posture.  You may have changes in your hair. These can include thickening of your hair, rapid growth, and changes in texture. Some women also have hair loss during or after pregnancy, or hair that feels dry or thin. Your hair will most likely return to normal after your baby is born.  Your breasts will continue to grow and be tender. A yellow discharge may leak from your breasts called colostrum.  Your belly button may stick out.  You may feel short of breath because of your expanding uterus.  You may notice the fetus "dropping," or moving lower in your abdomen.  You may have a bloody mucus discharge. This usually occurs a few days to a week before labor begins.  Your cervix becomes thin and soft (effaced) near your due date. WHAT TO EXPECT AT YOUR PRENATAL  EXAMS  You will have prenatal exams every 2 weeks until week 36. Then, you will have weekly prenatal exams. During a routine prenatal visit:  You will be weighed to make sure you and the fetus are growing normally.  Your blood pressure is taken.  Your abdomen will be measured to track your baby's growth.  The fetal heartbeat will be listened to.  Any test results from the previous visit will be discussed.  You may have a cervical check near your due date to see if you have effaced. At around 36 weeks, your caregiver will check your cervix. At the same time, your caregiver will also perform a test on the secretions of the vaginal tissue. This test is to determine if a type of bacteria, Group B streptococcus, is present. Your caregiver will explain this further. Your caregiver may ask you:  What your birth plan is.  How you are feeling.  If you are feeling the baby move.  If you have had any abnormal symptoms, such as leaking fluid, bleeding, severe headaches, or abdominal cramping.  If you have any questions. Other tests or screenings that may be performed during your third trimester include:  Blood tests that check for low iron levels (anemia).  Fetal testing to check the health, activity level, and growth of the fetus. Testing is done if you have certain medical conditions or if there are problems during the pregnancy. FALSE LABOR You may feel small, irregular contractions that   eventually go away. These are called Braxton Hicks contractions, or false labor. Contractions may last for hours, days, or even weeks before true labor sets in. If contractions come at regular intervals, intensify, or become painful, it is best to be seen by your caregiver.  SIGNS OF LABOR   Menstrual-like cramps.  Contractions that are 5 minutes apart or less.  Contractions that start on the top of the uterus and spread down to the lower abdomen and back.  A sense of increased pelvic pressure or back  pain.  A watery or bloody mucus discharge that comes from the vagina. If you have any of these signs before the 37th week of pregnancy, call your caregiver right away. You need to go to the hospital to get checked immediately. HOME CARE INSTRUCTIONS   Avoid all smoking, herbs, alcohol, and unprescribed drugs. These chemicals affect the formation and growth of the baby.  Follow your caregiver's instructions regarding medicine use. There are medicines that are either safe or unsafe to take during pregnancy.  Exercise only as directed by your caregiver. Experiencing uterine cramps is a good sign to stop exercising.  Continue to eat regular, healthy meals.  Wear a good support bra for breast tenderness.  Do not use hot tubs, steam rooms, or saunas.  Wear your seat belt at all times when driving.  Avoid raw meat, uncooked cheese, cat litter boxes, and soil used by cats. These carry germs that can cause birth defects in the baby.  Take your prenatal vitamins.  Try taking a stool softener (if your caregiver approves) if you develop constipation. Eat more high-fiber foods, such as fresh vegetables or fruit and whole grains. Drink plenty of fluids to keep your urine clear or pale yellow.  Take warm sitz baths to soothe any pain or discomfort caused by hemorrhoids. Use hemorrhoid cream if your caregiver approves.  If you develop varicose veins, wear support hose. Elevate your feet for 15 minutes, 3-4 times a day. Limit salt in your diet.  Avoid heavy lifting, wear low heal shoes, and practice good posture.  Rest a lot with your legs elevated if you have leg cramps or low back pain.  Visit your dentist if you have not gone during your pregnancy. Use a soft toothbrush to brush your teeth and be gentle when you floss.  A sexual relationship may be continued unless your caregiver directs you otherwise.  Do not travel far distances unless it is absolutely necessary and only with the approval  of your caregiver.  Take prenatal classes to understand, practice, and ask questions about the labor and delivery.  Make a trial run to the hospital.  Pack your hospital bag.  Prepare the baby's nursery.  Continue to go to all your prenatal visits as directed by your caregiver. SEEK MEDICAL CARE IF:  You are unsure if you are in labor or if your water has broken.  You have dizziness.  You have mild pelvic cramps, pelvic pressure, or nagging pain in your abdominal area.  You have persistent nausea, vomiting, or diarrhea.  You have a bad smelling vaginal discharge.  You have pain with urination. SEEK IMMEDIATE MEDICAL CARE IF:   You have a fever.  You are leaking fluid from your vagina.  You have spotting or bleeding from your vagina.  You have severe abdominal cramping or pain.  You have rapid weight loss or gain.  You have shortness of breath with chest pain.  You notice sudden or extreme swelling   of your face, hands, ankles, feet, or legs.  You have not felt your baby move in over an hour.  You have severe headaches that do not go away with medicine.  You have vision changes. Document Released: 07/14/2001 Document Revised: 07/25/2013 Document Reviewed: 09/20/2012 [employer] Patient Information 2015 [employer]. This information is not intended to replace advice given to you by your health care provider. Make sure you discuss any questions you have with your health care provider.  

## 2015-02-15 LAB — CULTURE, BETA STREP (GROUP B ONLY)

## 2015-02-15 LAB — GC/CHLAMYDIA PROBE AMP
CT Probe RNA: NEGATIVE
GC Probe RNA: NEGATIVE

## 2015-02-18 ENCOUNTER — Encounter (HOSPITAL_COMMUNITY): Payer: Self-pay | Admitting: *Deleted

## 2015-02-18 ENCOUNTER — Encounter: Payer: Self-pay | Admitting: Obstetrics & Gynecology

## 2015-02-18 ENCOUNTER — Inpatient Hospital Stay (HOSPITAL_COMMUNITY)
Admission: AD | Admit: 2015-02-18 | Discharge: 2015-02-18 | Disposition: A | Discharge status: Home / Self Care | Payer: Medicaid Other | Source: Ambulatory Visit | Attending: Obstetrics & Gynecology | Admitting: Obstetrics & Gynecology

## 2015-02-18 DIAGNOSIS — O99820 Streptococcus B carrier state complicating pregnancy: Secondary | ICD-10-CM | POA: Insufficient documentation

## 2015-02-18 DIAGNOSIS — Z3493 Encounter for supervision of normal pregnancy, unspecified, third trimester: Secondary | ICD-10-CM | POA: Diagnosis not present

## 2015-02-18 NOTE — MAU Note (Signed)
PT  SAYS SHE NOTICED  CLEAR  FLUID  AT  HOME-  WHILE  SITTING ON TOILET    VE IN CLINIC-  1   CM INSIDE    3  OUTSIDE  .  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    DENIES  MRSA.     GBS-  UNSURE

## 2015-02-18 NOTE — MAU Note (Signed)
Pt reports leaking fluid since 2000, state it is not constant. Denies problems with preg. Reports good fetal movement.

## 2015-02-26 ENCOUNTER — Ambulatory Visit (INDEPENDENT_AMBULATORY_CARE_PROVIDER_SITE_OTHER): Payer: Medicaid Other | Admitting: Certified Nurse Midwife

## 2015-02-26 VITALS — BP 116/59 | HR 102 | Temp 98.3°F | Wt 272.9 lb

## 2015-02-26 DIAGNOSIS — Z3483 Encounter for supervision of other normal pregnancy, third trimester: Secondary | ICD-10-CM

## 2015-02-26 LAB — POCT URINALYSIS DIP (DEVICE)
Bilirubin Urine: NEGATIVE
Glucose, UA: NEGATIVE mg/dL
Hgb urine dipstick: NEGATIVE
Ketones, ur: NEGATIVE mg/dL
Leukocytes, UA: NEGATIVE
Nitrite: NEGATIVE
Protein, ur: NEGATIVE mg/dL
Specific Gravity, Urine: 1.02 (ref 1.005–1.030)
Urobilinogen, UA: 1 mg/dL (ref 0.0–1.0)
pH: 6.5 (ref 5.0–8.0)

## 2015-02-26 NOTE — Progress Notes (Signed)
Subjective:  [name] is a 29 y.o. G3P1011 at [redacted]w[redacted]d being seen today for ongoing prenatal care.  Patient reports no complaints.  Contractions: Irregular.  Vag. Bleeding: None. Movement: Present. Denies leaking of fluid.   The following portions of the patient's history were reviewed and updated as appropriate: allergies, current medications, past family history, past medical history, past social history, past surgical history and problem list.   Objective:   Filed Vitals:   02/26/15 0907  BP: 116/59  Pulse: 102  Temp: 98.3 F (36.8 C)  Weight: 272 lb 14.4 oz (123.787 kg)    Fetal Status: Fetal Heart Rate (bpm): 147 Fundal Height: 37 cm Movement: Present  Presentation: Vertex  General:  Alert, oriented and cooperative. Patient is in no acute distress.  Skin: Skin is warm and dry. No rash noted.   Cardiovascular: Normal heart rate noted  Respiratory: Normal respiratory effort, no problems with respiration noted  Abdomen: Soft, gravid, appropriate for gestational age. Pain/Pressure: Present     Vaginal: Vag. Bleeding: None.    Vag D/C Character: White  Cervix: Exam revealed Dilation: 1.5 Effacement (%): 50 Station: Ballotable  Extremities: Normal range of motion.  Edema: Trace  Mental Status: Normal mood and affect. Normal behavior. Normal judgment and thought content.   Urinalysis: Urine Protein: Negative Urine Glucose: Negative  Assessment and Plan:  Pregnancy: G3P1011 at [redacted]w[redacted]d  There are no diagnoses linked to this encounter. Term labor symptoms and general obstetric precautions including but not limited to vaginal bleeding, contractions, leaking of fluid and fetal movement were reviewed in detail with the patient. Please refer to After Visit Summary for other counseling recommendations.  Return in about 1 week (around 03/05/2015).   Larey Days, CNM

## 2015-02-26 NOTE — Patient Instructions (Signed)
Third Trimester of Pregnancy The third trimester is from week 29 through week 42, months 7 through 9. The third trimester is a time when the fetus is growing rapidly. At the end of the ninth month, the fetus is about 20 inches in length and weighs 6-10 pounds.  BODY CHANGES Your body goes through many changes during pregnancy. The changes vary from woman to woman.   Your weight will continue to increase. You can expect to gain 25-35 pounds (11-16 kg) by the end of the pregnancy.  You may begin to get stretch marks on your hips, abdomen, and breasts.  You may urinate more often because the fetus is moving lower into your pelvis and pressing on your bladder.  You may develop or continue to have heartburn as a result of your pregnancy.  You may develop constipation because certain hormones are causing the muscles that push waste through your intestines to slow down.  You may develop hemorrhoids or swollen, bulging veins (varicose veins).  You may have pelvic pain because of the weight gain and pregnancy hormones relaxing your joints between the bones in your pelvis. Backaches may result from overexertion of the muscles supporting your posture.  You may have changes in your hair. These can include thickening of your hair, rapid growth, and changes in texture. Some women also have hair loss during or after pregnancy, or hair that feels dry or thin. Your hair will most likely return to normal after your baby is born.  Your breasts will continue to grow and be tender. A yellow discharge may leak from your breasts called colostrum.  Your belly button may stick out.  You may feel short of breath because of your expanding uterus.  You may notice the fetus "dropping," or moving lower in your abdomen.  You may have a bloody mucus discharge. This usually occurs a few days to a week before labor begins.  Your cervix becomes thin and soft (effaced) near your due date. WHAT TO EXPECT AT YOUR PRENATAL  EXAMS  You will have prenatal exams every 2 weeks until week 36. Then, you will have weekly prenatal exams. During a routine prenatal visit:  You will be weighed to make sure you and the fetus are growing normally.  Your blood pressure is taken.  Your abdomen will be measured to track your baby's growth.  The fetal heartbeat will be listened to.  Any test results from the previous visit will be discussed.  You may have a cervical check near your due date to see if you have effaced. At around 36 weeks, your caregiver will check your cervix. At the same time, your caregiver will also perform a test on the secretions of the vaginal tissue. This test is to determine if a type of bacteria, Group B streptococcus, is present. Your caregiver will explain this further. Your caregiver may ask you:  What your birth plan is.  How you are feeling.  If you are feeling the baby move.  If you have had any abnormal symptoms, such as leaking fluid, bleeding, severe headaches, or abdominal cramping.  If you have any questions. Other tests or screenings that may be performed during your third trimester include:  Blood tests that check for low iron levels (anemia).  Fetal testing to check the health, activity level, and growth of the fetus. Testing is done if you have certain medical conditions or if there are problems during the pregnancy. FALSE LABOR You may feel small, irregular contractions that   eventually go away. These are called Braxton Hicks contractions, or false labor. Contractions may last for hours, days, or even weeks before true labor sets in. If contractions come at regular intervals, intensify, or become painful, it is best to be seen by your caregiver.  SIGNS OF LABOR   Menstrual-like cramps.  Contractions that are 5 minutes apart or less.  Contractions that start on the top of the uterus and spread down to the lower abdomen and back.  A sense of increased pelvic pressure or back  pain.  A watery or bloody mucus discharge that comes from the vagina. If you have any of these signs before the 37th week of pregnancy, call your caregiver right away. You need to go to the hospital to get checked immediately. HOME CARE INSTRUCTIONS   Avoid all smoking, herbs, alcohol, and unprescribed drugs. These chemicals affect the formation and growth of the baby.  Follow your caregiver's instructions regarding medicine use. There are medicines that are either safe or unsafe to take during pregnancy.  Exercise only as directed by your caregiver. Experiencing uterine cramps is a good sign to stop exercising.  Continue to eat regular, healthy meals.  Wear a good support bra for breast tenderness.  Do not use hot tubs, steam rooms, or saunas.  Wear your seat belt at all times when driving.  Avoid raw meat, uncooked cheese, cat litter boxes, and soil used by cats. These carry germs that can cause birth defects in the baby.  Take your prenatal vitamins.  Try taking a stool softener (if your caregiver approves) if you develop constipation. Eat more high-fiber foods, such as fresh vegetables or fruit and whole grains. Drink plenty of fluids to keep your urine clear or pale yellow.  Take warm sitz baths to soothe any pain or discomfort caused by hemorrhoids. Use hemorrhoid cream if your caregiver approves.  If you develop varicose veins, wear support hose. Elevate your feet for 15 minutes, 3-4 times a day. Limit salt in your diet.  Avoid heavy lifting, wear low heal shoes, and practice good posture.  Rest a lot with your legs elevated if you have leg cramps or low back pain.  Visit your dentist if you have not gone during your pregnancy. Use a soft toothbrush to brush your teeth and be gentle when you floss.  A sexual relationship may be continued unless your caregiver directs you otherwise.  Do not travel far distances unless it is absolutely necessary and only with the approval  of your caregiver.  Take prenatal classes to understand, practice, and ask questions about the labor and delivery.  Make a trial run to the hospital.  Pack your hospital bag.  Prepare the baby's nursery.  Continue to go to all your prenatal visits as directed by your caregiver. SEEK MEDICAL CARE IF:  You are unsure if you are in labor or if your water has broken.  You have dizziness.  You have mild pelvic cramps, pelvic pressure, or nagging pain in your abdominal area.  You have persistent nausea, vomiting, or diarrhea.  You have a bad smelling vaginal discharge.  You have pain with urination. SEEK IMMEDIATE MEDICAL CARE IF:   You have a fever.  You are leaking fluid from your vagina.  You have spotting or bleeding from your vagina.  You have severe abdominal cramping or pain.  You have rapid weight loss or gain.  You have shortness of breath with chest pain.  You notice sudden or extreme swelling   of your face, hands, ankles, feet, or legs.  You have not felt your baby move in over an hour.  You have severe headaches that do not go away with medicine.  You have vision changes. Document Released: 07/14/2001 Document Revised: 07/25/2013 Document Reviewed: 09/20/2012 [employer] Patient Information 2015 [employer]. This information is not intended to replace advice given to you by your health care provider. Make sure you discuss any questions you have with your health care provider.  

## 2015-03-05 ENCOUNTER — Telehealth: Payer: Self-pay | Admitting: *Deleted

## 2015-03-05 ENCOUNTER — Encounter (HOSPITAL_COMMUNITY): Payer: Self-pay | Admitting: *Deleted

## 2015-03-05 ENCOUNTER — Inpatient Hospital Stay (HOSPITAL_COMMUNITY)
Admission: AD | Admit: 2015-03-05 | Discharge: 2015-03-07 | DRG: 774 | Disposition: A | Discharge status: Home / Self Care | Payer: Medicaid Other | Source: Ambulatory Visit | Attending: Family Medicine | Admitting: Family Medicine

## 2015-03-05 ENCOUNTER — Inpatient Hospital Stay (HOSPITAL_COMMUNITY): Payer: Medicaid Other | Admitting: Anesthesiology

## 2015-03-05 ENCOUNTER — Encounter: Payer: Medicaid Other | Admitting: Obstetrics and Gynecology

## 2015-03-05 DIAGNOSIS — Z3A39 39 weeks gestation of pregnancy: Secondary | ICD-10-CM | POA: Diagnosis present

## 2015-03-05 DIAGNOSIS — J45909 Unspecified asthma, uncomplicated: Secondary | ICD-10-CM | POA: Diagnosis present

## 2015-03-05 DIAGNOSIS — O99824 Streptococcus B carrier state complicating childbirth: Secondary | ICD-10-CM | POA: Diagnosis present

## 2015-03-05 DIAGNOSIS — O9882 Other maternal infectious and parasitic diseases complicating childbirth: Secondary | ICD-10-CM | POA: Diagnosis present

## 2015-03-05 DIAGNOSIS — Z833 Family history of diabetes mellitus: Secondary | ICD-10-CM

## 2015-03-05 DIAGNOSIS — O99214 Obesity complicating childbirth: Secondary | ICD-10-CM | POA: Diagnosis present

## 2015-03-05 DIAGNOSIS — Z87891 Personal history of nicotine dependence: Secondary | ICD-10-CM

## 2015-03-05 DIAGNOSIS — O9902 Anemia complicating childbirth: Secondary | ICD-10-CM | POA: Diagnosis present

## 2015-03-05 DIAGNOSIS — Z8349 Family history of other endocrine, nutritional and metabolic diseases: Secondary | ICD-10-CM | POA: Diagnosis not present

## 2015-03-05 DIAGNOSIS — Z6841 Body Mass Index (BMI) 40.0 and over, adult: Secondary | ICD-10-CM

## 2015-03-05 DIAGNOSIS — Z8249 Family history of ischemic heart disease and other diseases of the circulatory system: Secondary | ICD-10-CM | POA: Diagnosis not present

## 2015-03-05 DIAGNOSIS — O9952 Diseases of the respiratory system complicating childbirth: Secondary | ICD-10-CM | POA: Diagnosis present

## 2015-03-05 DIAGNOSIS — D573 Sickle-cell trait: Secondary | ICD-10-CM | POA: Diagnosis present

## 2015-03-05 DIAGNOSIS — O99820 Streptococcus B carrier state complicating pregnancy: Secondary | ICD-10-CM

## 2015-03-05 LAB — CBC
HCT: 29.4 % — ABNORMAL LOW (ref 36.0–46.0)
Hemoglobin: 9.3 g/dL — ABNORMAL LOW (ref 12.0–15.0)
MCH: 21.4 pg — ABNORMAL LOW (ref 26.0–34.0)
MCHC: 31.6 g/dL (ref 30.0–36.0)
MCV: 67.7 fL — ABNORMAL LOW (ref 78.0–100.0)
Platelets: 239 10*3/uL (ref 150–400)
RBC: 4.34 MIL/uL (ref 3.87–5.11)
RDW: 17 % — ABNORMAL HIGH (ref 11.5–15.5)
WBC: 11.3 10*3/uL — ABNORMAL HIGH (ref 4.0–10.5)

## 2015-03-05 LAB — TYPE AND SCREEN
ABO/RH(D): A POS
Antibody Screen: NEGATIVE

## 2015-03-05 LAB — ABO/RH: ABO/RH(D): A POS

## 2015-03-05 MED ORDER — TETANUS-DIPHTH-ACELL PERTUSSIS 5-2.5-18.5 LF-MCG/0.5 IM SUSP
0.5000 mL | Freq: Once | INTRAMUSCULAR | Status: AC
  Administered 2015-03-06: 0.5 mL via INTRAMUSCULAR
  Filled 2015-03-05: qty 0.5

## 2015-03-05 MED ORDER — SENNOSIDES-DOCUSATE SODIUM 8.6-50 MG PO TABS
2.0000 | ORAL_TABLET | ORAL | Status: DC
  Filled 2015-03-05 (×2): qty 2

## 2015-03-05 MED ORDER — PHENYLEPHRINE 40 MCG/ML (10ML) SYRINGE FOR IV PUSH (FOR BLOOD PRESSURE SUPPORT)
80.0000 ug | PREFILLED_SYRINGE | INTRAVENOUS | Status: DC | PRN
  Filled 2015-03-05: qty 2
  Filled 2015-03-05: qty 20

## 2015-03-05 MED ORDER — FENTANYL CITRATE (PF) 100 MCG/2ML IJ SOLN: 50.0000 ug | INTRAMUSCULAR | Status: DC | PRN

## 2015-03-05 MED ORDER — PRENATAL MULTIVITAMIN CH
1.0000 | ORAL_TABLET | Freq: Every day | ORAL | Status: DC
  Administered 2015-03-07: 1 via ORAL
  Filled 2015-03-05 (×2): qty 1

## 2015-03-05 MED ORDER — ONDANSETRON HCL 4 MG/2ML IJ SOLN: 4.0000 mg | INTRAMUSCULAR | Status: DC | PRN

## 2015-03-05 MED ORDER — OXYCODONE-ACETAMINOPHEN 5-325 MG PO TABS
2.0000 | ORAL_TABLET | ORAL | Status: DC | PRN
  Administered 2015-03-05: 2 via ORAL
  Filled 2015-03-05: qty 2

## 2015-03-05 MED ORDER — SODIUM CHLORIDE 0.9 % IV SOLN: 14.0000 mL/h | INTRAVENOUS | Status: DC | PRN

## 2015-03-05 MED ORDER — ZOLPIDEM TARTRATE 5 MG PO TABS: 5.0000 mg | ORAL_TABLET | Freq: Every evening | ORAL | Status: DC | PRN

## 2015-03-05 MED ORDER — LIDOCAINE HCL (PF) 1 % IJ SOLN
30.0000 mL | INTRAMUSCULAR | Status: AC | PRN
  Administered 2015-03-05: 30 mL via SUBCUTANEOUS
  Filled 2015-03-05: qty 30

## 2015-03-05 MED ORDER — OXYTOCIN BOLUS FROM INFUSION
500.0000 mL | INTRAVENOUS | Status: DC
  Administered 2015-03-05: 500 mL via INTRAVENOUS

## 2015-03-05 MED ORDER — FENTANYL 2.5 MCG/ML BUPIVACAINE 1/10 % EPIDURAL INFUSION (WH - ANES)
14.0000 mL/h | INTRAMUSCULAR | Status: DC | PRN
  Administered 2015-03-05 (×2): 14 mL/h via EPIDURAL
  Filled 2015-03-05: qty 125

## 2015-03-05 MED ORDER — DIPHENHYDRAMINE HCL 50 MG/ML IJ SOLN: 12.5000 mg | INTRAMUSCULAR | Status: DC | PRN

## 2015-03-05 MED ORDER — LANOLIN HYDROUS EX OINT: TOPICAL_OINTMENT | CUTANEOUS | Status: DC | PRN

## 2015-03-05 MED ORDER — CITRIC ACID-SODIUM CITRATE 334-500 MG/5ML PO SOLN: 30.0000 mL | ORAL | Status: DC | PRN

## 2015-03-05 MED ORDER — ALBUTEROL SULFATE (2.5 MG/3ML) 0.083% IN NEBU: 3.0000 mL | INHALATION_SOLUTION | Freq: Four times a day (QID) | RESPIRATORY_TRACT | Status: DC | PRN

## 2015-03-05 MED ORDER — ACETAMINOPHEN 325 MG PO TABS: 650.0000 mg | ORAL_TABLET | ORAL | Status: DC | PRN

## 2015-03-05 MED ORDER — EPHEDRINE 5 MG/ML INJ
10.0000 mg | INTRAVENOUS | Status: DC | PRN
  Filled 2015-03-05: qty 2

## 2015-03-05 MED ORDER — IBUPROFEN 600 MG PO TABS
600.0000 mg | ORAL_TABLET | Freq: Four times a day (QID) | ORAL | Status: DC
  Administered 2015-03-05 – 2015-03-07 (×5): 600 mg via ORAL
  Filled 2015-03-05 (×8): qty 1

## 2015-03-05 MED ORDER — BENZOCAINE-MENTHOL 20-0.5 % EX AERO
1.0000 "application " | INHALATION_SPRAY | CUTANEOUS | Status: DC | PRN
  Administered 2015-03-05: 1 via TOPICAL
  Filled 2015-03-05: qty 56

## 2015-03-05 MED ORDER — ONDANSETRON HCL 4 MG/2ML IJ SOLN: 4.0000 mg | Freq: Four times a day (QID) | INTRAMUSCULAR | Status: DC | PRN

## 2015-03-05 MED ORDER — DIBUCAINE 1 % RE OINT: 1.0000 "application " | TOPICAL_OINTMENT | RECTAL | Status: DC | PRN

## 2015-03-05 MED ORDER — LACTATED RINGERS IV SOLN: INTRAVENOUS | Status: DC

## 2015-03-05 MED ORDER — OXYTOCIN 40 UNITS IN LACTATED RINGERS INFUSION - SIMPLE MED
62.5000 mL/h | INTRAVENOUS | Status: DC
  Filled 2015-03-05: qty 1000

## 2015-03-05 MED ORDER — PENICILLIN G POTASSIUM 5000000 UNITS IJ SOLR
5.0000 10*6.[IU] | Freq: Once | INTRAVENOUS | Status: DC
  Filled 2015-03-05: qty 5

## 2015-03-05 MED ORDER — PENICILLIN G POTASSIUM 5000000 UNITS IJ SOLR
2.5000 10*6.[IU] | INTRAVENOUS | Status: DC
  Filled 2015-03-05: qty 2.5

## 2015-03-05 MED ORDER — WITCH HAZEL-GLYCERIN EX PADS
1.0000 "application " | MEDICATED_PAD | CUTANEOUS | Status: DC | PRN
  Administered 2015-03-05: 1 via TOPICAL

## 2015-03-05 MED ORDER — LIDOCAINE HCL (PF) 1 % IJ SOLN
INTRAMUSCULAR | Status: DC | PRN
  Administered 2015-03-05: 5 mL via EPIDURAL
  Administered 2015-03-05: 3 mL
  Administered 2015-03-05: 2 mL

## 2015-03-05 MED ORDER — ONDANSETRON HCL 4 MG PO TABS: 4.0000 mg | ORAL_TABLET | ORAL | Status: DC | PRN

## 2015-03-05 MED ORDER — OXYCODONE-ACETAMINOPHEN 5-325 MG PO TABS: 1.0000 | ORAL_TABLET | ORAL | Status: DC | PRN

## 2015-03-05 MED ORDER — AMPICILLIN SODIUM 2 G IJ SOLR
2.0000 g | Freq: Once | INTRAMUSCULAR | Status: DC
  Filled 2015-03-05: qty 2000

## 2015-03-05 MED ORDER — LACTATED RINGERS IV SOLN: 500.0000 mL | INTRAVENOUS | Status: DC | PRN

## 2015-03-05 MED ORDER — SIMETHICONE 80 MG PO CHEW: 80.0000 mg | CHEWABLE_TABLET | ORAL | Status: DC | PRN

## 2015-03-05 MED ORDER — DIPHENHYDRAMINE HCL 25 MG PO CAPS: 25.0000 mg | ORAL_CAPSULE | Freq: Four times a day (QID) | ORAL | Status: DC | PRN

## 2015-03-05 MED ORDER — ACETAMINOPHEN 325 MG PO TABS
650.0000 mg | ORAL_TABLET | ORAL | Status: DC | PRN
  Filled 2015-03-05 (×3): qty 2

## 2015-03-05 NOTE — Telephone Encounter (Signed)
Patients significant other came into clinic and said that patient is in a lot of pain and has been spotting this morning. Also states that patients water broke this morning. Advised him to drive her around to MAU.

## 2015-03-05 NOTE — Anesthesia Procedure Notes (Signed)
Epidural Patient location during procedure: OB  Staffing Anesthesiologist: Catalina Gravel Performed by: anesthesiologist   Preanesthetic Checklist Completed: patient identified, site marked, surgical consent, pre-op evaluation, timeout performed, IV checked, risks and benefits discussed and monitors and equipment checked  Epidural Patient position: sitting Approach: midline Location: L3-L4 Injection technique: LOR air  Needle:  Needle type: Tuohy  Needle gauge: 17 G Needle length: 9 cm Needle insertion depth: 7 cm Catheter at skin depth: 11 cm Test dose: negative  Assessment Events: blood not aspirated, injection not painful, no injection resistance, negative IV test and no paresthesia  Additional Notes Uncomplicated epidural. Catheter aspiration negative for heme/csf.Reason for block:procedure for pain

## 2015-03-05 NOTE — H&P (Signed)
LABOR ADMISSION HISTORY AND PHYSICAL  [name] is a 29 y.o. female G3P1011 with IUP at 5w4dby LMP c/w 6 wk sono presenting for SOL. She reports +FMs, No LOF,  no blurry vision, headaches or peripheral edema, and RUQ pain. Patient had ctx increasing in strength and frequency since midnight, along with some bloody show. She plans on breast feeding. She request Depo for birth control.  Dating: By LMP c/w 6 wk sono --->  Estimated Date of Delivery: 03/08/15    [facility]  Dating LMP Blood type: A/POS/-- (01/26 1637)  Genetic Screen 1 Screen: nml NT normal Antibody:NEG (01/26 1637)  Anatomic UKoreaNml at 18 wks, limited view of heart > 29w nml Rubella: 1.02 (01/26 1637)  GTT Early: 90 Third trimester: 139-->3 hr nml (1hr 170, 2hr 154) RPR: NON REAC (01/26 1637)   TDaP vaccine  HBsAg: NEGATIVE (01/26 1637)   Flu vaccine 08/28/2014 HIV: NONREACTIVE (01/26 1637)   GBS Positive GBS: POSITIVE  Contraception Mirena Pap: Pap neg; GC/Ct neg x 2  Baby Food Breast   Circumcision it's a girl!   Pediatrician [facility]   Support [place]        Prenatal History/Complications:  Past Medical History: Past Medical History  Diagnosis Date  . Asthma   . Trichomonas vaginitis     Past Surgical History: Past Surgical History  Procedure Laterality Date  . Dilation and curettage of uterus      Obstetrical History: OB History    Gravida Para Term Preterm AB TAB SAB Ectopic Multiple Living   _0 Social History: History   Social History  . Marital Status: Single    Spouse Name: N/A  . Number of Children: N/A  . Years of Education: N/A   Social History Main Topics  . Smoking status: Former Smoker    Types: Cigarettes  . Smokeless tobacco: Never Used  . Alcohol Use: No  . Drug Use: No  . Sexual Activity: Yes    Birth Control/ Protection: None   Other Topics Concern  . None   Social History Narrative     Family History: Family History  Problem Relation Age of Onset  . Diabetes Mother   . Hypertension Mother   . Hyperlipidemia Mother   . Heart disease Mother   . Diabetes Sister   . Diabetes Brother     Allergies: No Known Allergies  Prescriptions prior to admission  Medication Sig Dispense Refill Last Dose  . albuterol (PROVENTIL HFA;VENTOLIN HFA) 108 (90 BASE) MCG/ACT inhaler Inhale 2 puffs into the lungs every 6 (six) hours as needed for wheezing or shortness of breath. (Patient taking differently: Inhale 2 puffs into the lungs every 6 (six) hours as needed for shortness of breath (emergency medication for shortness of breath). ) 1 Inhaler 2 Taking  . cyclobenzaprine (FLEXERIL) 5 MG tablet Take 1 tablet (5 mg total) by mouth 3 (three) times daily as needed for muscle spasms. 30 tablet 0 Taking  . penicillin v potassium (VEETID) 500 MG tablet Take 500 mg by mouth 4 (four) times daily.   Not Taking  . Prenatal Multivit-Min-Fe-FA (PRENATAL VITAMINS) 0.8 MG tablet Take 1 tablet by mouth daily. (Patient taking differently: Take 2 tablets by mouth daily. ) 30 tablet 12 Taking  . valACYclovir (VALTREX) 500 MG tablet Take 1 tablet (500 mg total) by mouth 2 (two) times daily. 60 tablet  1 Taking     Review of Systems   All systems reviewed and negative except as stated in HPI  Blood pressure 107/80, pulse 87, temperature 98.1 F (36.7 C), resp. rate 18, last menstrual period 06/01/2014. General appearance: alert and cooperative Lungs: clear to auscultation bilaterally Heart: regular rate and rhythm Abdomen: soft, non-tender; bowel sounds normal Extremities: Homans sign is negative, no sign of DVT Presentation: cephalic Fetal monitoringBaseline: 140 bpm, Variability: Good {> 6 bpm), Accelerations: Reactive and Decelerations: occ variable Uterine activityFrequency: Every 3-5 minutes Dilation: 4 Effacement (%): 80 Station: -2 Exam by:: GInger Morris RN   Prenatal labs: ABO, Rh:  A/POS/-- (01/26 1637) Antibody: NEG (01/26 1637) Rubella:  Non-immune  RPR: NON REAC (05/17 0857)  HBsAg: NEGATIVE (01/26 1637)  HIV: NONREACTIVE (05/17 0857)  GBS: Positive (07/14 0000)    Prenatal Transfer Tool  Maternal Diabetes: No Genetic Screening: Normal Maternal Ultrasounds/Referrals: Normal Fetal Ultrasounds or other Referrals:  None Maternal Substance Abuse:  No Significant Maternal Medications:  None Significant Maternal Lab Results: Lab values include: Group B Strep positive  No results found for this or any previous visit (from the past 24 hour(s)).  Patient Active Problem List   Diagnosis Date Noted  . Group B Streptococcus carrier, +RV culture, currently pregnant 02/18/2015  . Obesity in pregnancy, antepartum   . Sickle cell trait 09/01/2014  . Supervision of other normal pregnancy 08/28/2014  . Herpes genitalia 08/28/2014    Assessment: [name] is a 29 y.o. G3P1011 at 33w4dhere for SOL   #Labor:expectant management, augment with pitocin as needed #Pain: Epidural upon request #FWB: Cat I #ID:  GBS positive --prophylaxis with pcn; rubella non-immune will need MMR vaccine  #MOF: breast #MOC:depo vs Mirena #Circ:  N/A   CMelina Schools8/09/2014, 9:16 AM   I have seen this patient and agree with the above resident's H&P.  Pt progressed rapidly to 7cm following admission and PCN order changed to Ampicillin.    LEFTWICH-KIRBY, LBarnesCertified Nurse-Midwife

## 2015-03-05 NOTE — Lactation Note (Signed)
This note was copied from the chart of [name]. Lactation Consultation Note  Patient Name: [name] UDTHY'H Date: 03/05/2015 Reason for consult: Initial assessment  With this first time mom and term baby, now 8 hours old. Mom has had 1 attempt at the breast. The baby was fussy, and mom called out for assistance. I assisted mom with cross cradle hold latch - the baby was too sleepy, would not open her mouth or eyes. Mom has lots of colostrum, so I hand expressed for her into a spoon, and spoon fed the baby at  Least 7 mls of coosotrum. I did basic teaching with mom on breast feeding and lactation services. Mom knows to call for questions/coccenrs/help with latching.   Maternal Data Formula Feeding for Exclusion: No Has patient been taught Hand Expression?: Yes Does the patient have breastfeeding experience prior to this delivery?: No  Feeding Feeding Type: Breast Milk  LATCH Score/Interventions Latch: Too sleepy or reluctant, no latch achieved, no sucking elicited. Intervention(s): Skin to skin;Teach feeding cues  Audible Swallowing: None Intervention(s): Skin to skin  Type of Nipple: Everted at rest and after stimulation  Comfort (Breast/Nipple): Soft / non-tender     Hold (Positioning): Assistance needed to correctly position infant at breast and maintain latch. Intervention(s): Breastfeeding basics reviewed;Support Pillows;Position options;Skin to skin  LATCH Score: 5  Lactation Tools Discussed/Used     Consult Status Consult Status: Follow-up Date: 03/06/15 Follow-up type: In-patient    Tonna Corner 03/05/2015, 3:20 PM

## 2015-03-05 NOTE — MAU Note (Signed)
Been contracting all night, getting closer and stronger, some spotting. No leaking.  Was 1+ last wk. Denies problems with preg

## 2015-03-05 NOTE — MAU Note (Signed)
Pt presents to MAU with complaints of contractions that started around midnight. Reports small amount leakage of clear fluid. Denies any vaginal bleeding

## 2015-03-05 NOTE — Progress Notes (Signed)
Dr Juleen China notified of pt's VE, orders received to admit pt

## 2015-03-05 NOTE — Anesthesia Postprocedure Evaluation (Signed)
  Anesthesia Post-op Note  Patient: [name]  Procedure(s) Performed: * No procedures listed *  Patient Location: Mother/Baby  Anesthesia Type:Epidural  Level of Consciousness: awake  Airway and Oxygen Therapy: Patient Spontanous Breathing  Post-op Pain: mild  Post-op Assessment: Patient's Cardiovascular Status Stable and Respiratory Function Stable              Post-op Vital Signs: stable  Last Vitals:  Filed Vitals:   03/05/15 1410  BP: 130/70  Pulse: 81  Temp: 36.7 C  Resp: 20    Complications: No apparent anesthesia complications

## 2015-03-05 NOTE — Anesthesia Preprocedure Evaluation (Addendum)
Anesthesia Evaluation  Patient identified by MRN, date of birth, ID band Patient awake    Reviewed: Allergy & Precautions, NPO status , Patient's Chart, lab work & pertinent test results  Airway Mallampati: III   Neck ROM: Full    Dental  (+) Teeth Intact   Pulmonary asthma , former smoker,    Pulmonary exam normal       Cardiovascular negative cardio ROS Normal cardiovascular examRhythm:Regular Rate:Normal     Neuro/Psych negative neurological ROS  negative psych ROS   GI/Hepatic   Endo/Other  Morbid obesity  Renal/GU   negative genitourinary   Musculoskeletal   Abdominal   Peds  Hematology  (+) anemia ,   Anesthesia Other Findings   Reproductive/Obstetrics (+) Pregnancy                            Anesthesia Physical Anesthesia Plan  ASA: III  Anesthesia Plan: Epidural   Post-op Pain Management:    Induction:   Airway Management Planned:   Additional Equipment:   Intra-op Plan:   Post-operative Plan:   Informed Consent: I have reviewed the patients History and Physical, chart, labs and discussed the procedure including the risks, benefits and alternatives for the proposed anesthesia with the patient or authorized representative who has indicated his/her understanding and acceptance.   Dental advisory given  Plan Discussed with:   Anesthesia Plan Comments:         Anesthesia Quick Evaluation

## 2015-03-06 LAB — RPR: RPR Ser Ql: NONREACTIVE

## 2015-03-06 MED ORDER — OXYCODONE-ACETAMINOPHEN 5-325 MG PO TABS
2.0000 | ORAL_TABLET | Freq: Once | ORAL | Status: AC
  Administered 2015-03-07: 2 via ORAL
  Filled 2015-03-06: qty 2

## 2015-03-06 MED ORDER — OXYCODONE-ACETAMINOPHEN 5-325 MG PO TABS
2.0000 | ORAL_TABLET | ORAL | Status: AC | PRN
  Administered 2015-03-06: 2 via ORAL
  Filled 2015-03-06: qty 2

## 2015-03-06 NOTE — Progress Notes (Signed)
Post Partum Day #1  Subjective:  [name] is a 29 y.o. O1L5726 [redacted]w[redacted]d s/p SVD.  No acute events overnight.  Pt denies problems with ambulating, voiding or po intake.  Denies any pain or burning of lacerations 2/2 urination. She denies nausea or vomiting.  Pain is moderately controlled. Endorses good control with Percocet, but currently experiencing 8/10 vaginal pain which she attributes to saying seated for too long. Pain is better if able to move and/or ambulate. She also reports dyspnea while reclined, but denies any while ambulating or with exertion. Denies chest pain, no edema. She reports moderate vaginal bleeding that is gradually diminishing. She has had flatus. She has not had bowel movement.  Lochia Small.  Plan for birth control is Depo-Provera.  Method of Feeding: Breast, currently supplementing with formula  Objective: BP 118/78 mmHg  Pulse 81  Temp(Src) 98.3 F (36.8 C) (Oral)  Resp 20  Ht $R'5\' 8"'oZ$  (1.727 m)  Wt 123.378 kg (272 lb)  BMI 41.37 kg/m2  SpO2 100%  LMP 06/01/2014  Breastfeeding? Unknown  Physical Exam:  General: alert, cooperative and no distress Lochia:normal flow Chest: CTAB Heart: RRR no m/r/g Abdomen: +BS, soft, nontender, fundus firm at/below umbilicus Uterine Fundus: firm DVT Evaluation: No evidence of DVT seen on physical exam. Extremities: no edema   Recent Labs  03/05/15 0920  HGB 9.3*  HCT 29.4*    Assessment/Plan:  ASSESSMENT: [name] is a 29 y.o. O0B5597 [redacted]w[redacted]d PPD #1 s/p NSVD doing well.    GBS+ without antibiotic administration, will stay through PPD #2  Lactation consultation  Rubella non-immune, will need MMR prior to d/c  Contraception Depo  Post[facility] follow up with [facility]   LOS: 1 day   Philippa Chester 03/06/2015, 7:45 AM   CNM attestation Post Partum Day #1   [name] is a 29 y.o. C1U3845 s/p SVD.  Pt denies problems with ambulating, voiding or po intake. Pain is well controlled.  Plan for birth  control is Depo-Provera.  Method of Feeding: breast  PE:  BP 120/71 mmHg  Pulse 74  Temp(Src) 98 F (36.7 C) (Oral)  Resp 18  Ht $R'5\' 8"'AO$  (1.727 m)  Wt 123.378 kg (272 lb)  BMI 41.37 kg/m2  SpO2 100%  LMP 06/01/2014  Breastfeeding? Unknown Fundus firm  Plan for discharge: AM 03/07/15 due to inadequate GBS ppx  Serita Grammes, CNM 9:37 AM

## 2015-03-06 NOTE — Progress Notes (Signed)
Pt refused Motrin.

## 2015-03-07 MED ORDER — ALBUTEROL SULFATE (2.5 MG/3ML) 0.083% IN NEBU
3.0000 mL | INHALATION_SOLUTION | RESPIRATORY_TRACT | Status: DC
  Filled 2015-03-07 (×2): qty 3

## 2015-03-07 MED ORDER — IBUPROFEN 600 MG PO TABS: 600.0000 mg | ORAL_TABLET | Freq: Four times a day (QID) | ORAL | Status: DC

## 2015-03-07 NOTE — Discharge Summary (Signed)
Obstetric Discharge Summary Reason for Admission: onset of labor Prenatal Procedures: none Intrapartum Procedures: spontaneous vaginal delivery Postpartum Procedures: none Complications-Operative and Postpartum: 2nd degree perineal laceration  At 10:48 AM a viable and healthy female was delivered via Vaginal, Spontaneous Delivery (Presentation: ; Occiput Anterior). APGAR: 9,9 ; weight pending.  Placenta status: Intact, Spontaneous. Cord: 3 vessels with the following complications: none. Cord pH: N/A  Anesthesia: Epidural Episiotomy: None Lacerations: 2nd degree;Perineal;Sulcus Suture Repair: 3.0 vicryl Est. Blood Loss (mL): 87 .  Hospital Course:  Active Problems:   Indication for care in labor or delivery   [name] is a 29 y.o. Q4B2010 s/p SVD.  Patient was admitted 08/02 for SOL.  She has postpartum course that was uncomplicated including no problems with ambulating, PO intake, urination, pain, or bleeding. The pt feels ready to go home and  will be discharged with outpatient follow-up.   Today: No acute events overnight.  Pt denies problems with ambulating, voiding or po intake.  She denies nausea or vomiting.  Pain is well controlled.  She has had flatus. She has not had bowel movement.  Lochia Small.  Plan for birth control is  Depo-Provera.  Method of Feeding: Breast  Patient has hx of asthma and reports some mild SOB.   Physical Exam:  General: alert and cooperative  Lungs: CTAB, no wheezes Lochia: appropriate Uterine Fundus: firm DVT Evaluation: No evidence of DVT seen on physical exam.  H/H: Lab Results  Component Value Date/Time   HGB 9.3* 03/05/2015 09:20 AM   HCT 29.4* 03/05/2015 09:20 AM    Discharge Diagnoses: Term Pregnancy-delivered  Discharge Information: Date: 03/07/2015 Activity: pelvic rest Diet: routine  Medications: PNV and Ibuprofen Breast feeding:  Yes Condition: stable Instructions: refer to handout Discharge to: home    Will prescribe albuterol inhaler for SOB, as patient has used this in the past as a successful rescue inhaler for occasional episodes of SOB.       Medication List    STOP taking these medications        valACYclovir 500 MG tablet  Commonly known as:  VALTREX      TAKE these medications        albuterol 108 (90 BASE) MCG/ACT inhaler  Commonly known as:  PROVENTIL HFA;VENTOLIN HFA  Inhale 2 puffs into the lungs every 6 (six) hours as needed for wheezing or shortness of breath.     [employer] PRENATAL GUMMY PO  Take 1 tablet by mouth daily.     ibuprofen 600 MG tablet  Commonly known as:  ADVIL,MOTRIN  Take 1 tablet (600 mg total) by mouth every 6 (six) hours.         Melina Schools ,MD OB Fellow 03/07/2015,7:55 AM   CNM attestation I have seen and examined this patient and agree with above documentation in the resident's note.   [name] is a 29 y.o. O7H2197 s/p SVD.   Pain is well controlled.  Plan for birth control is Depo-Provera.  Method of Feeding: breast  PE:  BP 120/71 mmHg  Pulse 74  Temp(Src) 98 F (36.7 C) (Oral)  Resp 18  Ht 5\' 8"  (1.727 m)  Wt 123.378 kg (272 lb)  BMI 41.37 kg/m2  SpO2 100%  LMP 06/01/2014  Breastfeeding? Unknown Fundus firm   Recent Labs  03/05/15 0920  HGB 9.3*  HCT 29.4*     Plan: discharge today - postpartum care discussed - f/u clinic in 6 weeks for postpartum visit  Serita Grammes, CNM 9:32 AM  03/07/2015

## 2015-03-07 NOTE — Discharge Instructions (Signed)

## 2015-04-05 ENCOUNTER — Ambulatory Visit: Payer: Medicaid Other | Admitting: Obstetrics & Gynecology

## 2015-04-24 ENCOUNTER — Ambulatory Visit (INDEPENDENT_AMBULATORY_CARE_PROVIDER_SITE_OTHER): Payer: Self-pay | Admitting: Obstetrics and Gynecology

## 2015-04-24 ENCOUNTER — Encounter: Payer: Self-pay | Admitting: Obstetrics and Gynecology

## 2015-04-24 VITALS — BP 139/74 | HR 82 | Temp 98.6°F | Ht 68.0 in | Wt 275.7 lb

## 2015-04-24 DIAGNOSIS — I10 Essential (primary) hypertension: Secondary | ICD-10-CM

## 2015-04-24 DIAGNOSIS — A6000 Herpesviral infection of urogenital system, unspecified: Secondary | ICD-10-CM

## 2015-04-24 MED ORDER — FAMCICLOVIR 500 MG PO TABS: ORAL_TABLET | ORAL | Status: DC

## 2015-04-24 NOTE — Progress Notes (Signed)
CLINIC ENCOUNTER NOTE  History:  29 y.o. [id number] here today for postpartum visit.  NSVD 8/2. 2nd degree laceration. No bleeding. No vaginal pain. Thinks vagina is fully healed. Bottle feeding. Had a period, stopped on Sunday. Started sex 3 weeks ago, not using protection. Felt depressed first 3 weeks, no more, no crying or feeling down or not leaving the house. No problems with urination. Hx HSV, had episode of tingling and burning few weeks ago, no rash.   Wants OCPs. Says diagnosed with HTN prior to pregnancy and started on lisinopril. No history venous thrombus. No history stroke. No history breast or other cancer. No history liver disease. Smokes a few cigarettes per year. No history migraine w/ aura  Past Medical History  Diagnosis Date  . Asthma   . Trichomonas vaginitis   . Herpes   . Hypertension     Past Surgical History  Procedure Laterality Date  . Dilation and curettage of uterus      The following portions of the patient's history were reviewed and updated as appropriate: allergies, current medications, past family history, past medical history, past social history, past surgical history and problem list.   Health Maintenance: normal pap during pregnancy.  Review of Systems:  See above; comprehensive review of systems was otherwise negative.  Objective:  Physical Exam BP 139/74 mmHg  Pulse 82  Temp(Src) 98.6 F (37 C)  Ht 5\' 8"  (1.727 m)  Wt 275 lb 11.2 oz (125.057 kg)  BMI 41.93 kg/m2  Breastfeeding? No CONSTITUTIONAL: Well-developed, well-nourished female in no acute distress.  HENT:  Normocephalic, atraumatic SKIN: Skin is warm and dry.  [place]: Alert  PSYCHIATRIC: Normal mood and affect.  CARDIOVASCULAR: Normal heart rate noted RESPIRATORY: Effort and breath sounds normal, no problems with respiration noted ABDOMEN: Soft, no distention noted.  No tenderness, rebound or guarding.     Labs and Imaging No results found.  Assessment & Plan:   #  Postpartum exam - bottle feeding - no symptoms depression - long discussion of contraception. Hx HTN makes OCPs category 3. We discussed LARC. Plan is for Nexplanon, though patient declined today. Plan was for depo today (UPT negative, unprotected sex yesterday but says finished period on Sunday, so low risk of pregnancy), but we had to wait for it to arrive from upstairs, and the patient decided to leave. My RN discussed importance of condoms or abstinence until Nexplanon  # HSV2 - Two lifetime outbreaks, discussed that episodic treatment likely a better option than daily suppressive therapy (which the patient initially desired).  - famciclovir 1000 bid for 1 day at first signs of outbreak - f/u here or with PCP if this regimen not adequate  Routine preventative health maintenance measures emphasized. Normal pap within last 3 years.     Dazja Houchin B. Alexavier Tsutsui, [place] for [employer], [place]

## 2015-05-08 ENCOUNTER — Ambulatory Visit: Payer: Self-pay | Admitting: Obstetrics and Gynecology

## 2015-07-04 ENCOUNTER — Other Ambulatory Visit: Payer: Self-pay | Admitting: Advanced Practice Midwife

## 2015-07-04 ENCOUNTER — Telehealth: Payer: Self-pay | Admitting: Advanced Practice Midwife

## 2015-07-04 ENCOUNTER — Encounter (HOSPITAL_COMMUNITY): Payer: Self-pay | Admitting: *Deleted

## 2015-07-04 ENCOUNTER — Inpatient Hospital Stay (HOSPITAL_COMMUNITY)
Admission: AD | Admit: 2015-07-04 | Discharge: 2015-07-04 | Disposition: A | Discharge status: Home / Self Care | Payer: Medicaid Other | Source: Ambulatory Visit | Attending: Obstetrics & Gynecology | Admitting: Obstetrics & Gynecology

## 2015-07-04 ENCOUNTER — Ambulatory Visit (HOSPITAL_COMMUNITY)
Admission: RE | Admit: 2015-07-04 | Discharge: 2015-07-04 | Disposition: A | Discharge status: Home / Self Care | Payer: Medicaid Other | Source: Ambulatory Visit | Attending: Advanced Practice Midwife | Admitting: Advanced Practice Midwife

## 2015-07-04 DIAGNOSIS — M79606 Pain in leg, unspecified: Secondary | ICD-10-CM | POA: Insufficient documentation

## 2015-07-04 DIAGNOSIS — M79605 Pain in left leg: Secondary | ICD-10-CM | POA: Diagnosis not present

## 2015-07-04 DIAGNOSIS — Z87891 Personal history of nicotine dependence: Secondary | ICD-10-CM | POA: Diagnosis not present

## 2015-07-04 DIAGNOSIS — R52 Pain, unspecified: Secondary | ICD-10-CM | POA: Diagnosis not present

## 2015-07-04 DIAGNOSIS — M79602 Pain in left arm: Secondary | ICD-10-CM | POA: Diagnosis not present

## 2015-07-04 DIAGNOSIS — Z331 Pregnant state, incidental: Secondary | ICD-10-CM

## 2015-07-04 MED ORDER — CYCLOBENZAPRINE HCL 10 MG PO TABS: 5.0000 mg | ORAL_TABLET | Freq: Three times a day (TID) | ORAL | Status: DC | PRN

## 2015-07-04 NOTE — Progress Notes (Signed)
VASCULAR LAB PRELIMINARY  PRELIMINARY  PRELIMINARY  PRELIMINARY  Left lower extremity venous duplex completed.    Preliminary report:  There is no DVT, SVT, or Baker's cyst noted in the left lower extremity.   Huntleigh Doolen, RVT 07/04/2015, 3:01 PM

## 2015-07-04 NOTE — MAU Provider Note (Signed)
Chief Complaint: Leg Pain  First Provider Initiated Contact with Patient 07/04/15 1051     SUBJECTIVE HPI: [name] is a 29 y.o. [id number] at [redacted]w[redacted]d who presents to Maternity Admissions reporting leg pain that started after moving heavy furniture. Pain started on her left inner calf and has now wrapped around to behind her left knee. Was seen at [facility] urgent care this morning and states she was instructed to come to maternity admissions to get a Doppler of her leg.   She denies abdominal pain, vaginal bleeding or any pregnancy-related complaints. She has not started prenatal care, but plans to go to [facility] outpatient clinic because of chronic hypertension. She's not on any medications for hypertension.  Location: Left leg Quality: Sore, tender Severity: 8/10 on pain scale Duration: 2 days Context: After moving heavy furniture Timing: Constant Modifying factors: No improvement with warm or cold compresses Associated signs and symptoms: Negative for shortness of breath, calf pain.  Past Medical History  Diagnosis Date  . Asthma   . Trichomonas vaginitis   . Herpes   . Hypertension    OB History  Gravida Para Term Preterm AB SAB TAB Ectopic Multiple Living  4 2 2  1 1    0 2    # Outcome Date GA Lbr Len/2nd Weight Sex Delivery Anes PTL Lv  4 Current           3 Term 03/05/15 [redacted]w[redacted]d 10:44 / 00:04 6 lb 4.9 oz (2.86 kg) F Vag-Spont EPI,Local  Y  2 Term 11/02/12 [redacted]w[redacted]d  7 lb 5 oz (3.317 kg) F Vag-Spont EPI N Y  1 SAB 2014             Past Surgical History  Procedure Laterality Date  . Dilation and curettage of uterus     Social History   Social History  . Marital Status: Married    Spouse Name: N/A  . Number of Children: N/A  . Years of Education: N/A   Occupational History  . Not on file.   Social History Main Topics  . Smoking status: Former Smoker    Types: Cigarettes  . Smokeless tobacco: Never Used  . Alcohol Use: Yes     Comment: occasional   . Drug Use: No  . Sexual Activity: Yes    Birth Control/ Protection: None   Other Topics Concern  . Not on file   Social History Narrative   No current facility-administered medications on file prior to encounter.   Current Outpatient Prescriptions on File Prior to Encounter  Medication Sig Dispense Refill  . albuterol (PROVENTIL HFA;VENTOLIN HFA) 108 (90 BASE) MCG/ACT inhaler Inhale 2 puffs into the lungs every 6 (six) hours as needed for wheezing or shortness of breath. (Patient taking differently: Inhale 2 puffs into the lungs every 6 (six) hours as needed for shortness of breath (emergency medication for shortness of breath). ) 1 Inhaler 2  . famciclovir (FAMVIR) 500 MG tablet Take 2 tabs twice a day for one day at the first sign of an outbreak 4 tablet 5  . ibuprofen (ADVIL,MOTRIN) 600 MG tablet Take 1 tablet (600 mg total) by mouth every 6 (six) hours. 30 tablet 0  . Prenatal Vit-Min-FA-Fish Oil ([employer] PRENATAL GUMMY PO) Take 1 tablet by mouth daily.     No Known Allergies  I have reviewed the past Medical Hx, Surgical Hx, Social Hx, Allergies and Medications.   Review of Systems  OBJECTIVE Patient Vitals for the past 24 hrs:  BP Temp Pulse Resp  07/04/15 1022 148/80 mmHg 98.2 F (36.8 C) 98 18   Constitutional: Well-developed, well-nourished female in no acute distress. Mild distress with walking. Cardiovascular: normal rate Respiratory: normal rate and effort.  GI: Abd soft, non-tender. MS: File tenderness behind left knee, no edema, normal ROM GU: Deferred   LAB RESULTS No results found for this or any previous visit (from the past 24 hour(s)).  IMAGING Fetal heart rate 150 by informal bedside ultrasound. Unable to get a reliable crown-rump length due to maternal body habitus, but suspect that baby is measuring size less than dates.  MAU COURSE Called [facility] to arrange for Doppler of left lower extremity. Tech is unavailable to come to [facility] for  several hours. Fastest option would be for patient to go to [facility] for 2:00 outpatient appointment.  MDM 29 year old female with acute left leg pain was likely due to musculoskeletal strain or moving furniture, but with increased risk of DVT due to pregnant state DVT needs to be ruled out. No pregnancy-related concerns. No evidence of PE. Patient stable for outpatient lower extremity Dopplers.  ASSESSMENT 1. Acute leg pain, left   2. Pregnancy, incidental     PLAN Discharge home in stable condition. DVT and PE Precautions First trimester precautions. Established normal patient may use Flexeril sparingly as needed for pain.   Follow-up Information    Follow up with [facility] On 07/04/2015.   Why:  at 2:00 pm. Go to main enterence [employer]) on [address]. There is Valet parking.    Contact information:   [place]       Follow up with [facility].   Why:  As needed in obstectric emergencies   Contact information:   [address] [id number] [place] [phone]        Medication List    STOP taking these medications        ibuprofen 600 MG tablet  Commonly known as:  ADVIL,MOTRIN      TAKE these medications        albuterol 108 (90 BASE) MCG/ACT inhaler  Commonly known as:  PROVENTIL HFA;VENTOLIN HFA  Inhale 2 puffs into the lungs every 6 (six) hours as needed for wheezing or shortness of breath.     [employer]S PRENATAL GUMMY PO  Take 1 tablet by mouth daily.     cyclobenzaprine 10 MG tablet  Commonly known as:  FLEXERIL  Take 0.5-1 tablets (5-10 mg total) by mouth every 8 (eight) hours as needed for muscle spasms.     famciclovir 500 MG tablet  Commonly known as:  FAMVIR  Take 2 tabs twice a day for one day at the first sign of an outbreak        [place], [place] 07/04/2015  10:37 AM

## 2015-07-04 NOTE — Telephone Encounter (Signed)
Called pt to notify her of normal LLE dopplers. Discussed trying ice or heat and Flexeril for leg pain. May need to see PCP/Sports Medicine/ortho if Sx worsen.

## 2015-07-04 NOTE — Discharge Instructions (Signed)
Deep Vein Thrombosis °A deep vein thrombosis (DVT) is a blood clot (thrombus) that usually occurs in a deep, larger vein of the lower leg or the pelvis, or in an upper extremity such as the arm. These are dangerous and can lead to serious and even life-threatening complications if the clot travels to the lungs. °A DVT can damage the valves in your leg veins so that instead of flowing upward, the blood pools in the lower leg. This is called post-thrombotic syndrome, and it can result in pain, swelling, discoloration, and sores on the leg. °CAUSES °A DVT is caused by the formation of a blood clot in your leg, pelvis, or arm. Usually, several things contribute to the formation of blood clots. A clot may develop when: °· Your blood flow slows down. °· Your vein becomes damaged in some way. °· You have a condition that makes your blood clot more easily. °RISK FACTORS °A DVT is more likely to develop in: °· People who are older, especially over 60 years of age. °· People who are overweight (obese). °· People who sit or lie still for a long time, such as during long-distance travel (over 4 hours), bed rest, hospitalization, or during recovery from certain medical conditions like a stroke. °· People who do not engage in much physical activity (sedentary lifestyle). °· People who have chronic breathing disorders. °· People who have a personal or family history of blood clots or blood clotting disease. °· People who have peripheral vascular disease (PVD), diabetes, or some types of cancer. °· People who have heart disease, especially if the person had a recent heart attack or has congestive heart failure. °· People who have neurological diseases that affect the legs (leg paresis). °· People who have had a traumatic injury, such as breaking a hip or leg. °· People who have recently had major or lengthy surgery, especially on the hip, knee, or abdomen. °· People who have had a central line placed inside a large vein. °· People  who take medicines that contain the hormone estrogen. These include birth control pills and hormone replacement therapy. °· Pregnancy or during childbirth or the postpartum period. °· Long plane flights (over 8 hours). °SIGNS AND SYMPTOMS °Symptoms of a DVT can include:  °· Swelling of your leg or arm, especially if one side is much worse. °· Warmth and redness of your leg or arm, especially if one side is much worse. °· Pain in your arm or leg. If the clot is in your leg, symptoms may be more noticeable or worse when you stand or walk. °· A feeling of pins and needles, if the clot is in the arm. °The symptoms of a DVT that has traveled to the lungs (pulmonary embolism, PE) usually start suddenly and include: °· Shortness of breath while active or at rest. °· Coughing or coughing up blood or blood-tinged mucus. °· Chest pain that is often worse with deep breaths. °· Rapid or irregular heartbeat. °· Feeling light-headed or dizzy. °· Fainting. °· Feeling anxious. °· Sweating. °There may also be pain and swelling in a leg if that is where the blood clot started. °These symptoms may represent a serious problem that is an emergency. Do not wait to see if the symptoms will go away. Get medical help right away. Call your local emergency services (911 in the [place] Do not drive yourself to the hospital. °DIAGNOSIS °Your health care provider will take a medical history and perform a physical exam. You may also   have other tests, including: °· Blood tests to assess the clotting properties of your blood. °· Imaging tests, such as CT, ultrasound, MRI, X-ray, and other tests to see if you have clots anywhere in your body. °TREATMENT °After a DVT is identified, it can be treated. The type of treatment that you receive depends on many factors, such as the cause of your DVT, your risk for bleeding or developing more clots, and other medical conditions that you have. Sometimes, a combination of treatments is necessary. Treatment  options may be combined and include: °· Monitoring the blood clot with ultrasound. °· Taking medicines by mouth, such as newer blood thinners (anticoagulants), thrombolytics, or warfarin. °· Taking anticoagulant medicine by injection or through an IV tube. °· Wearing compression stockings or using different types of devices. °· Surgery (rare) to remove the blood clot or to place a filter in your abdomen to stop the blood clot from traveling to your lungs. °Treatments for a DVT are often divided into immediate treatment and long-term treatment (up to 3 months after DVT). You can work with your health care provider to choose the treatment program that is best for you. °HOME CARE INSTRUCTIONS °If you are taking a newer oral anticoagulant: °· Take the medicine every single day at the same time each day. °· Understand what foods and drugs interact with this medicine. °· Understand that there are no regular blood tests required when using this medicine. °· Understand the side effects of this medicine, including excessive bruising or bleeding. Ask your health care provider or pharmacist about other possible side effects. °If you are taking warfarin: °· Understand how to take warfarin and know which foods can affect how warfarin works in your body. °· Understand that it is dangerous to take too much or too little warfarin. Too much warfarin increases the risk of bleeding. Too little warfarin continues to allow the risk for blood clots. °· Follow your PT and INR blood testing schedule. The PT and INR results allow your health care provider to adjust your dose of warfarin. It is very important that you have your PT and INR tested as often as told by your health care provider. °· Avoid major changes in your diet, or tell your health care provider before you change your diet. Arrange a visit with a registered dietitian to answer your questions. Many foods, especially foods that are high in vitamin K, can interfere with warfarin  and affect the PT and INR results. Eat a consistent amount of foods that are high in vitamin K, such as: °¨ Spinach, kale, broccoli, cabbage, collard greens, turnip greens, Brussels sprouts, peas, cauliflower, seaweed, and parsley. °¨ Beef liver and pork liver. °¨ Green tea. °¨ Soybean oil. °· Tell your health care provider about any and all medicines, vitamins, and supplements that you take, including aspirin and other over-the-counter anti-inflammatory medicines. Be especially cautious with aspirin and anti-inflammatory medicines. Do not take those before you ask your health care provider if it is safe to do so. This is important because many medicines can interfere with warfarin and affect the PT and INR results. °· Do not start or stop taking any over-the-counter or prescription medicine unless your health care provider or pharmacist tells you to do so. °If you take warfarin, you will also need to do these things: °· Hold pressure over cuts for longer than usual. °· Tell your dentist and other health care providers that you are taking warfarin before you have any procedures in which   bleeding may occur. °· Avoid alcohol or drink very small amounts. Tell your health care provider if you change your alcohol intake. °· Do not use tobacco products, including cigarettes, chewing tobacco, and e-cigarettes. If you need help quitting, ask your health care provider. °· Avoid contact sports. °General Instructions °· Take over-the-counter and prescription medicines only as told by your health care provider. Anticoagulant medicines can have side effects, including easy bruising and difficulty stopping bleeding. If you are prescribed an anticoagulant, you will also need to do these things: °¨ Hold pressure over cuts for longer than usual. °¨ Tell your dentist and other health care providers that you are taking anticoagulants before you have any procedures in which bleeding may occur. °¨ Avoid contact sports. °· Wear a medical  alert bracelet or carry a medical alert card that says you have had a PE. °· Ask your health care provider how soon you can go back to your normal activities. Stay active to prevent new blood clots from forming. °· Make sure to exercise while traveling or when you have been sitting or standing for a long period of time. It is very important to exercise. Exercise your legs by walking or by tightening and relaxing your leg muscles often. Take frequent walks. °· Wear compression stockings as told by your health care provider to help prevent more blood clots from forming. °· Do not use tobacco products, including cigarettes, chewing tobacco, and e-cigarettes. If you need help quitting, ask your health care provider. °· Keep all follow-up appointments with your health care provider. This is important. °PREVENTION °Take these actions to decrease your risk of developing another DVT: °· Exercise regularly. For at least 30 minutes every day, engage in: °¨ Activity that involves moving your arms and legs. °¨ Activity that encourages good blood flow through your body by increasing your heart rate. °· Exercise your arms and legs every hour during long-distance travel (over 4 hours). Drink plenty of water and avoid drinking alcohol while traveling. °· Avoid sitting or lying in bed for long periods of time without moving your legs. °· Maintain a weight that is appropriate for your height. Ask your health care provider what weight is healthy for you. °· If you are a woman who is over 35 years of age, avoid unnecessary use of medicines that contain estrogen. These include birth control pills. °· Do not smoke, especially if you take estrogen medicines. If you need help quitting, ask your health care provider. °If you are hospitalized, prevention measures may include: °· Early walking after surgery, as soon as your health care provider says that it is safe. °· Receiving anticoagulants to prevent blood clots. If you cannot take  anticoagulants, other options may be available, such as wearing compression stockings or using different types of devices. °SEEK IMMEDIATE MEDICAL CARE IF: °· You have new or increased pain, swelling, or redness in an arm or leg. °· You have numbness or tingling in an arm or leg. °· You have shortness of breath while active or at rest. °· You have chest pain. °· You have a rapid or irregular heartbeat. °· You feel light-headed or dizzy. °· You cough up blood. °· You notice blood in your vomit, bowel movement, or urine. °These symptoms may represent a serious problem that is an emergency. Do not wait to see if the symptoms will go away. Get medical help right away. Call your local emergency services (911 in the [place] Do not drive yourself to the hospital. °  °  This information is not intended to replace advice given to you by your health care provider. Make sure you discuss any questions you have with your health care provider. °  °Document Released: 07/20/2005 Document Revised: 04/10/2015 Document Reviewed: 11/14/2014 °Elsevier Interactive Patient Education ©2016 [employer] ° °

## 2015-07-04 NOTE — MAU Note (Signed)
Marlou Porch CNM at bedside discussing plan of care with pt

## 2015-07-04 NOTE — MAU Note (Signed)
Pt presents to MAU with complaints of pain in her left leg. Denies any vaginal bleeding or abdominal pain

## 2015-07-05 ENCOUNTER — Encounter: Payer: Self-pay | Admitting: *Deleted

## 2015-08-07 ENCOUNTER — Ambulatory Visit (INDEPENDENT_AMBULATORY_CARE_PROVIDER_SITE_OTHER): Payer: Medicaid Other | Admitting: Advanced Practice Midwife

## 2015-08-07 ENCOUNTER — Other Ambulatory Visit (HOSPITAL_COMMUNITY)
Admission: RE | Admit: 2015-08-07 | Discharge: 2015-08-07 | Disposition: A | Discharge status: Home / Self Care | Payer: Medicaid Other | Source: Ambulatory Visit | Attending: Advanced Practice Midwife | Admitting: Advanced Practice Midwife

## 2015-08-07 ENCOUNTER — Encounter: Payer: Self-pay | Admitting: Advanced Practice Midwife

## 2015-08-07 VITALS — BP 121/64 | HR 89 | Temp 97.8°F | Wt 270.9 lb

## 2015-08-07 DIAGNOSIS — O10912 Unspecified pre-existing hypertension complicating pregnancy, second trimester: Secondary | ICD-10-CM

## 2015-08-07 DIAGNOSIS — Z23 Encounter for immunization: Secondary | ICD-10-CM | POA: Diagnosis not present

## 2015-08-07 DIAGNOSIS — O98512 Other viral diseases complicating pregnancy, second trimester: Secondary | ICD-10-CM | POA: Diagnosis not present

## 2015-08-07 DIAGNOSIS — Z113 Encounter for screening for infections with a predominantly sexual mode of transmission: Secondary | ICD-10-CM | POA: Insufficient documentation

## 2015-08-07 DIAGNOSIS — O99612 Diseases of the digestive system complicating pregnancy, second trimester: Secondary | ICD-10-CM | POA: Diagnosis present

## 2015-08-07 DIAGNOSIS — K219 Gastro-esophageal reflux disease without esophagitis: Secondary | ICD-10-CM

## 2015-08-07 DIAGNOSIS — O0990 Supervision of high risk pregnancy, unspecified, unspecified trimester: Secondary | ICD-10-CM | POA: Insufficient documentation

## 2015-08-07 DIAGNOSIS — O9989 Other specified diseases and conditions complicating pregnancy, childbirth and the puerperium: Secondary | ICD-10-CM | POA: Diagnosis not present

## 2015-08-07 DIAGNOSIS — R1013 Epigastric pain: Secondary | ICD-10-CM | POA: Diagnosis not present

## 2015-08-07 DIAGNOSIS — O26899 Other specified pregnancy related conditions, unspecified trimester: Secondary | ICD-10-CM

## 2015-08-07 DIAGNOSIS — B009 Herpesviral infection, unspecified: Secondary | ICD-10-CM | POA: Diagnosis not present

## 2015-08-07 DIAGNOSIS — O10919 Unspecified pre-existing hypertension complicating pregnancy, unspecified trimester: Secondary | ICD-10-CM

## 2015-08-07 DIAGNOSIS — Z3490 Encounter for supervision of normal pregnancy, unspecified, unspecified trimester: Secondary | ICD-10-CM

## 2015-08-07 LAB — COMPREHENSIVE METABOLIC PANEL
ALT: 8 U/L (ref 6–29)
AST: 10 U/L (ref 10–30)
Albumin: 3.4 g/dL — ABNORMAL LOW (ref 3.6–5.1)
Alkaline Phosphatase: 79 U/L (ref 33–115)
BUN: 5 mg/dL — ABNORMAL LOW (ref 7–25)
CO2: 22 mmol/L (ref 20–31)
Calcium: 8.7 mg/dL (ref 8.6–10.2)
Chloride: 102 mmol/L (ref 98–110)
Creat: 0.7 mg/dL (ref 0.50–1.10)
Glucose, Bld: 128 mg/dL — ABNORMAL HIGH (ref 65–99)
Potassium: 3.8 mmol/L (ref 3.5–5.3)
Sodium: 134 mmol/L — ABNORMAL LOW (ref 135–146)
Total Bilirubin: 0.3 mg/dL (ref 0.2–1.2)
Total Protein: 6.3 g/dL (ref 6.1–8.1)

## 2015-08-07 LAB — POCT URINALYSIS DIP (DEVICE)
Bilirubin Urine: NEGATIVE
Glucose, UA: NEGATIVE mg/dL
Hgb urine dipstick: NEGATIVE
Ketones, ur: NEGATIVE mg/dL
Leukocytes, UA: NEGATIVE
Nitrite: NEGATIVE
Protein, ur: NEGATIVE mg/dL
Specific Gravity, Urine: 1.02 (ref 1.005–1.030)
Urobilinogen, UA: 0.2 mg/dL (ref 0.0–1.0)
pH: 6 (ref 5.0–8.0)

## 2015-08-07 MED ORDER — FAMOTIDINE 20 MG PO TABS: 20.0000 mg | ORAL_TABLET | Freq: Two times a day (BID) | ORAL | Status: DC

## 2015-08-07 MED ORDER — ASPIRIN EC 81 MG PO TBEC: 81.0000 mg | DELAYED_RELEASE_TABLET | Freq: Every day | ORAL | Status: DC

## 2015-08-07 NOTE — Progress Notes (Signed)
Anatomy US scheduled January 18th @ 1100

## 2015-08-07 NOTE — Progress Notes (Signed)
Subjective:    [name] is a [id number] [redacted]w[redacted]d by LMP being seen today for her first obstetrical visit.  Her obstetrical history is significant for mild CHTN, short interval between pregnancies. Patient does intend to breast feed. Pregnancy history fully reviewed.  Patient reports heartburn, nausea, vomiting and Epigastric pain. Partial improvement of pain with Tums. Vomits 1-2 times per day, but not every day.  Filed Vitals:   08/07/15 0909  BP: 121/64  Pulse: 89  Temp: 97.8 F (36.6 C)  Weight: 270 lb 14.4 oz (122.879 kg)    HISTORY: OB History  Gravida Para Term Preterm AB SAB TAB Ectopic Multiple Living  4 2 2  1 1    0 2    # Outcome Date GA Lbr Len/2nd Weight Sex Delivery Anes PTL Lv  4 Current           3 Term 03/05/15 [redacted]w[redacted]d 10:44 / 00:04 6 lb 4.9 oz (2.86 kg) F Vag-Spont EPI,Local  Y  2 Term 11/02/12 [redacted]w[redacted]d  7 lb 5 oz (3.317 kg) F Vag-Spont EPI N Y  1 SAB 2014             Past Medical History  Diagnosis Date  . Asthma   . Trichomonas vaginitis   . Herpes   . Hypertension    Past Surgical History  Procedure Laterality Date  . Dilation and curettage of uterus     Family History  Problem Relation Age of Onset  . Diabetes Mother   . Hypertension Mother   . Hyperlipidemia Mother   . Heart disease Mother   . Diabetes Sister   . Diabetes Brother      Exam  BP 121/64 mmHg  Pulse 89  Temp(Src) 97.8 F (36.6 C)  Wt 270 lb 14.4 oz (122.879 kg)  LMP 04/08/2015  Uterus:    18-week size (difficult to assess due to maternal body habitus)   Pelvic Exam:  deferred    Bony Pelvis: proven to 7-5 pounds  System: Breast:  normal appearance, no masses or tenderness   Skin: normal coloration and turgor, no rashes    Neurologic: oriented, normal mood, grossly non-focal   Extremities: normal strength, tone, and muscle mass   HEENT sclera clear, anicteric   Mouth/Teeth mucous membranes moist, pharynx normal without lesions and dental hygiene good   Neck  supple and no masses   Cardiovascular: regular rate and rhythm, no murmurs or gallops   Respiratory:  appears well, vitals normal, no respiratory distress, acyanotic, normal RR, chest clear, no wheezing, crepitations, rhonchi, normal symmetric air entry   Abdomen: soft, non-tender; bowel sounds normal; no masses,  no organomegaly      Assessment:    Pregnancy: [id number] Patient Active Problem List   Diagnosis Date Noted  . Supervision of normal pregnancy 08/07/2015  . Chronic hypertension 04/24/2015  . Sickle cell trait ([place]) 09/01/2014  . Herpes simplex type 2 (HSV-2) infection affecting pregnancy, antepartum 08/28/2014      1. Supervision of normal pregnancy, unspecified trimester  - Glucose Tolerance, 1 HR (50g) w/o Fasting - Prenatal Profile - Flu Vaccine QUAD 36+ mos IM; Standing - GC/Chlamydia probe amp ([place])not at [facility] - Culture, OB Urine - Prescript Monitor Profile(19) - AFP, Quad Screen - [place] MFM OB COMP + 14 WK - Comprehensive metabolic panel - famotidine (PEPCID) 20 MG tablet; Take 1 tablet (20 mg total) by mouth 2 (two) times daily.  Dispense: 60 tablet; Refill: 3 - Flu Vaccine QUAD  36+ mos IM  2. Gastroesophageal reflux in pregancy, second trimester  - Glucose Tolerance, 1 HR (50g) w/o Fasting - Prenatal Profile - Flu Vaccine QUAD 36+ mos IM; Standing - GC/Chlamydia probe amp ()not at [facility] - Culture, OB Urine - Prescript Monitor Profile(19) - AFP, Quad Screen - [place] MFM OB COMP + 14 WK - Comprehensive metabolic panel - famotidine (PEPCID) 20 MG tablet; Take 1 tablet (20 mg total) by mouth 2 (two) times daily.  Dispense: 60 tablet; Refill: 3 - Flu Vaccine QUAD 36+ mos IM  3. Herpes simplex type 2 (HSV-2) infection affecting pregnancy, antepartum   4. Chronic hypertension during pregnancy, antepartum  - aspirin EC 81 MG tablet; Take 1 tablet (81 mg total) by mouth daily. Take after 12 weeks for prevention of preeclampssia later in pregnancy   Dispense: 300 tablet; Refill: 2  5. Pregnancy with epigastric pain, antepartum  - Amylase - Lipase  Plan:     Initial labs drawn. Prenatal vitamins. Problem list reviewed and updated. Genetic Screening discussed Quad Screen: ordered.  Ultrasound discussed; fetal survey: ordered.  Follow up in 4 weeks.  Manya Silvas 08/07/2015

## 2015-08-07 NOTE — Progress Notes (Signed)
Here for first ob visit.  Given new education booklets. C/o headache =8 past few days and dizziness.  C/o some epigastric pressure.

## 2015-08-07 NOTE — Patient Instructions (Signed)

## 2015-08-08 LAB — CULTURE, OB URINE
Colony Count: NO GROWTH
Organism ID, Bacteria: NO GROWTH

## 2015-08-08 LAB — PRESCRIPTION MONITORING PROFILE (19 PANEL)
Amphetamine/Meth: NEGATIVE ng/mL
Barbiturate Screen, Urine: NEGATIVE ng/mL
Benzodiazepine Screen, Urine: NEGATIVE ng/mL
Buprenorphine, Urine: NEGATIVE ng/mL
Cannabinoid Scrn, Ur: NEGATIVE ng/mL
Carisoprodol, Urine: NEGATIVE ng/mL
Cocaine Metabolites: NEGATIVE ng/mL
Creatinine, Urine: 259.81 mg/dL (ref >20.0)
Fentanyl, Ur: NEGATIVE ng/mL
MDMA URINE: NEGATIVE ng/mL
Meperidine, Ur: NEGATIVE ng/mL
Methadone Screen, Urine: NEGATIVE ng/mL
Methaqualone: NEGATIVE ng/mL
Nitrites, Initial: NEGATIVE ug/mL
Opiate Screen, Urine: NEGATIVE ng/mL
Oxycodone Screen, Ur: NEGATIVE ng/mL
Phencyclidine, Ur: NEGATIVE ng/mL
Propoxyphene: NEGATIVE ng/mL
Tapentadol, urine: NEGATIVE ng/mL
Tramadol Scrn, Ur: NEGATIVE ng/mL
Zolpidem, Urine: NEGATIVE ng/mL
pH, Initial: 5.4 pH (ref 4.5–8.9)

## 2015-08-08 LAB — GC/CHLAMYDIA PROBE AMP (~~LOC~~) NOT AT ARMC
Chlamydia: NEGATIVE
Neisseria Gonorrhea: NEGATIVE

## 2015-08-08 LAB — GLUCOSE TOLERANCE, 1 HOUR (50G) W/O FASTING: Glucose, 1 Hour GTT: 117 mg/dL (ref 70–140)

## 2015-08-09 LAB — PRENATAL PROFILE (SOLSTAS)
Antibody Screen: NEGATIVE
Basophils Absolute: 0 10*3/uL (ref 0.0–0.1)
Basophils Relative: 0 % (ref 0–1)
Eosinophils Absolute: 0.1 10*3/uL (ref 0.0–0.7)
Eosinophils Relative: 1 % (ref 0–5)
HCT: 29.3 % — ABNORMAL LOW (ref 36.0–46.0)
HIV 1&2 Ab, 4th Generation: NONREACTIVE
Hemoglobin: 9.5 g/dL — ABNORMAL LOW (ref 12.0–15.0)
Hepatitis B Surface Ag: NEGATIVE
Lymphocytes Relative: 31 % (ref 12–46)
Lymphs Abs: 2 10*3/uL (ref 0.7–4.0)
MCH: 22.8 pg — ABNORMAL LOW (ref 26.0–34.0)
MCHC: 32.4 g/dL (ref 30.0–36.0)
MCV: 70.4 fL — ABNORMAL LOW (ref 78.0–100.0)
MPV: 7.9 fL — ABNORMAL LOW (ref 8.6–12.4)
Monocytes Absolute: 0.4 10*3/uL (ref 0.1–1.0)
Monocytes Relative: 7 % (ref 3–12)
Neutro Abs: 3.9 10*3/uL (ref 1.7–7.7)
Neutrophils Relative %: 61 % (ref 43–77)
Platelets: 258 10*3/uL (ref 150–400)
RBC: 4.16 MIL/uL (ref 3.87–5.11)
RDW: 16.7 % — ABNORMAL HIGH (ref 11.5–15.5)
Rh Type: POSITIVE
Rubella: 1.53 Index — ABNORMAL HIGH (ref <0.90)
WBC: 6.4 10*3/uL (ref 4.0–10.5)

## 2015-08-11 ENCOUNTER — Encounter: Payer: Self-pay | Admitting: Advanced Practice Midwife

## 2015-08-11 DIAGNOSIS — O280 Abnormal hematological finding on antenatal screening of mother: Secondary | ICD-10-CM | POA: Insufficient documentation

## 2015-08-11 DIAGNOSIS — O99019 Anemia complicating pregnancy, unspecified trimester: Secondary | ICD-10-CM | POA: Insufficient documentation

## 2015-08-11 DIAGNOSIS — O09899 Supervision of other high risk pregnancies, unspecified trimester: Secondary | ICD-10-CM | POA: Insufficient documentation

## 2015-08-11 MED ORDER — VALACYCLOVIR HCL 500 MG PO TABS: 500.0000 mg | ORAL_TABLET | Freq: Two times a day (BID) | ORAL | Status: DC

## 2015-08-14 ENCOUNTER — Encounter: Payer: Self-pay | Admitting: Family Medicine

## 2015-08-14 ENCOUNTER — Telehealth: Payer: Self-pay | Admitting: General Practice

## 2015-08-14 DIAGNOSIS — J4520 Mild intermittent asthma, uncomplicated: Secondary | ICD-10-CM | POA: Insufficient documentation

## 2015-08-14 DIAGNOSIS — O10919 Unspecified pre-existing hypertension complicating pregnancy, unspecified trimester: Secondary | ICD-10-CM | POA: Insufficient documentation

## 2015-08-14 NOTE — Telephone Encounter (Signed)
Per [place], pt has increased risk of downs syndrome 1:33. Patient has not had ultrasound yet, need to correlate dating, but has ultrasound scheduled next week on 1/18. Called MFM who scheduled patient genetic counseling if needed immediately after u/s appt. MFM will recalculate quad if needed after ultrasound. Also pt is anemic. Needs to start OTC Ferrous Sulfate 325 mg PO BID. Called patient, no answer- unable to leave message

## 2015-08-19 NOTE — Telephone Encounter (Signed)
Called pt and informed pt of abnormal quad screen and that on her [place] scheduled for 08/21/15 she will also have genetic counseling in which they will explain to her what the results mean.  I also informed pt that she is anemic and an iron supplement has been sent to her [place] off [address]  Pt stated understanding with no further questions.

## 2015-08-21 ENCOUNTER — Other Ambulatory Visit: Payer: Self-pay | Admitting: Advanced Practice Midwife

## 2015-08-21 ENCOUNTER — Ambulatory Visit (HOSPITAL_COMMUNITY): Admission: RE | Admit: 2015-08-21 | Payer: Medicaid Other | Source: Ambulatory Visit

## 2015-08-21 ENCOUNTER — Ambulatory Visit (HOSPITAL_COMMUNITY)
Admission: RE | Admit: 2015-08-21 | Discharge: 2015-08-21 | Disposition: A | Discharge status: Home / Self Care | Payer: Medicaid Other | Source: Ambulatory Visit | Attending: Advanced Practice Midwife | Admitting: Advanced Practice Midwife

## 2015-08-21 DIAGNOSIS — O283 Abnormal ultrasonic finding on antenatal screening of mother: Secondary | ICD-10-CM | POA: Insufficient documentation

## 2015-08-21 DIAGNOSIS — Z3687 Encounter for antenatal screening for uncertain dates: Secondary | ICD-10-CM

## 2015-08-21 DIAGNOSIS — Z3490 Encounter for supervision of normal pregnancy, unspecified, unspecified trimester: Secondary | ICD-10-CM

## 2015-08-21 DIAGNOSIS — O09892 Supervision of other high risk pregnancies, second trimester: Secondary | ICD-10-CM

## 2015-08-21 DIAGNOSIS — B009 Herpesviral infection, unspecified: Secondary | ICD-10-CM

## 2015-08-21 DIAGNOSIS — O10919 Unspecified pre-existing hypertension complicating pregnancy, unspecified trimester: Secondary | ICD-10-CM

## 2015-08-21 DIAGNOSIS — O289 Unspecified abnormal findings on antenatal screening of mother: Secondary | ICD-10-CM

## 2015-08-21 DIAGNOSIS — O98512 Other viral diseases complicating pregnancy, second trimester: Secondary | ICD-10-CM

## 2015-08-21 DIAGNOSIS — O10012 Pre-existing essential hypertension complicating pregnancy, second trimester: Secondary | ICD-10-CM | POA: Insufficient documentation

## 2015-08-21 DIAGNOSIS — Z3A18 18 weeks gestation of pregnancy: Secondary | ICD-10-CM

## 2015-08-21 DIAGNOSIS — Z3A19 19 weeks gestation of pregnancy: Secondary | ICD-10-CM

## 2015-09-04 ENCOUNTER — Encounter: Payer: Medicaid Other | Admitting: Advanced Practice Midwife

## 2015-09-04 ENCOUNTER — Encounter (HOSPITAL_COMMUNITY): Payer: Medicaid Other

## 2015-09-11 ENCOUNTER — Encounter: Payer: Self-pay | Admitting: Advanced Practice Midwife

## 2015-09-11 ENCOUNTER — Ambulatory Visit (HOSPITAL_COMMUNITY): Admission: RE | Admit: 2015-09-11 | Payer: Medicaid Other | Source: Ambulatory Visit

## 2015-09-11 ENCOUNTER — Ambulatory Visit (INDEPENDENT_AMBULATORY_CARE_PROVIDER_SITE_OTHER): Payer: Medicaid Other | Admitting: Advanced Practice Midwife

## 2015-09-11 VITALS — BP 127/66 | HR 89 | Temp 98.3°F | Wt 273.4 lb

## 2015-09-11 DIAGNOSIS — R0989 Other specified symptoms and signs involving the circulatory and respiratory systems: Secondary | ICD-10-CM | POA: Diagnosis not present

## 2015-09-11 DIAGNOSIS — H6501 Acute serous otitis media, right ear: Secondary | ICD-10-CM

## 2015-09-11 DIAGNOSIS — O289 Unspecified abnormal findings on antenatal screening of mother: Secondary | ICD-10-CM

## 2015-09-11 DIAGNOSIS — K0889 Other specified disorders of teeth and supporting structures: Secondary | ICD-10-CM | POA: Diagnosis not present

## 2015-09-11 DIAGNOSIS — O280 Abnormal hematological finding on antenatal screening of mother: Secondary | ICD-10-CM

## 2015-09-11 DIAGNOSIS — O10912 Unspecified pre-existing hypertension complicating pregnancy, second trimester: Secondary | ICD-10-CM

## 2015-09-11 DIAGNOSIS — O10919 Unspecified pre-existing hypertension complicating pregnancy, unspecified trimester: Secondary | ICD-10-CM

## 2015-09-11 LAB — AFP, QUAD SCREEN
AFP: 18.2 ng/mL
Age Alone: 1:825 {titer}
Curr Gest Age: 16 wks.days
Down Syndrome Scr Risk Est: 1:96 {titer}
HCG, Total: 59.72 IU/mL
INH: 219.4 pg/mL
Interpretation-AFP: POSITIVE — AB
MoM for AFP: 0.51
MoM for INH: 1.32
MoM for hCG: 1.49
Open Spina bifida: NEGATIVE
Osb Risk: <1:54600 {titer}
Tri 18 Scr Risk Est: NEGATIVE
Trisomy 18 (Edward) Syndrome Interp.: 1:11800 {titer}
uE3 Mom: 0.66
uE3 Value: 0.53 ng/mL

## 2015-09-11 LAB — POCT URINALYSIS DIP (DEVICE)
Bilirubin Urine: NEGATIVE
Glucose, UA: NEGATIVE mg/dL
Hgb urine dipstick: NEGATIVE
Ketones, ur: NEGATIVE mg/dL
Leukocytes, UA: NEGATIVE
Nitrite: NEGATIVE
Protein, ur: NEGATIVE mg/dL
Specific Gravity, Urine: 1.02 (ref 1.005–1.030)
Urobilinogen, UA: 1 mg/dL (ref 0.0–1.0)
pH: 6.5 (ref 5.0–8.0)

## 2015-09-11 MED ORDER — AZITHROMYCIN 250 MG PO TABS: ORAL_TABLET | ORAL | Status: DC

## 2015-09-11 NOTE — Progress Notes (Signed)
Pt reports left ear pain/pressure unsure if r/t to toothache; pt states she has an dentist appt tomorrow at 09/12/15 @ [facility].  Educated pt on Skin to Skin and reviewed [facility] [place]

## 2015-09-11 NOTE — Patient Instructions (Addendum)
Medicines safe in pregnancy:  Sudafed Mucinex  Benadryl Saline nasal spray  2-3 days of ibuprofen before 28 weeks is safe  For ear: Use warm salt water to rinse 2-3 times/day   Dental Assistance:  If unable to pay or uninsured, contact: [facility]. to become qualified for the adult dental clinic. Patient must be enrolled in [facility] (uninsured, 0-200% FPL, qualifying info).  Enroll in [facility] first, then see Primary Care Physician assigned to you, the PCP makes a dental referral. [place] [facility] will receive referral and contacts patient for appointment.  Patients with Medicaid           [address] [place] (Children up to 65 + Pregnant Women) - [phone]  [place] - [phone]  If unable to pay, or uninsured: contact [employer] [phone] in [place] - ([place] only + Pregnant Women), [phone] in [place] only) to become qualified for the adult dental clinic  Must see if eligible to enroll in [place] before enrolling into the [facility] (exemption required) [phone] for an appointment)  [email or web address];   [phone].  If not eligible for ACA, then go by Department of Health and Human Services to see if eligible for "orange card."  [address], [place] and [place].  Once you get an orange card, you will have a Primary Care home who will then refer you to dental if needed.    Other Scientist, forensic:   [facility] [phone] (ext [phone])   [address]  Dr. Donn Pierini - [phone]   [place]   [address]           [place], [place], [place], [address]           [phone], Ext. [phone]           2nd and 4th Thursday of the month at 6:30am (Simple extractions only - no wisdom  teeth or surgery) First come/First serve -First 10 clients served           [facility] [place], [place] and [place] residents only)          [address] [place], [place], [address]           [phone]                    [place] Department           [phone]          [place]          [facility]          [phone]

## 2015-09-11 NOTE — Progress Notes (Signed)
Subjective:  [name] is a 30 y.o. [id number] at [redacted]w[redacted]d being seen today for ongoing prenatal care.  She is currently monitored for the following issues for this high-risk pregnancy and has Herpes simplex type 2 (HSV-2) infection affecting pregnancy, antepartum; Sickle cell trait ([place]); Chronic hypertension; Supervision of high risk pregnancy, antepartum; Abnormal quad screen; Anemia affecting pregnancy, antepartum; Short interval between pregnancies affecting pregnancy, antepartum; Chronic hypertension during pregnancy, antepartum; Morbid (severe) obesity due to excess calories ([place]); and Asthma, mild intermittent on her problem list.  Patient reports chest congestion x 2 weeks and left ear pain x 3 days.  She also reports left upper molar tooth pain x 3-4 days and has dentist appt tomorrow..  Contractions: Not present. Vag. Bleeding: None.  Movement: Present. Denies leaking of fluid.   The following portions of the patient's history were reviewed and updated as appropriate: allergies, current medications, past family history, past medical history, past social history, past surgical history and problem list. Problem list updated.  Objective:   Filed Vitals:   09/11/15 0834  BP: 127/66  Pulse: 89  Temp: 98.3 F (36.8 C)  Weight: 273 lb 6.4 oz (124.013 kg)    Fetal Status: Fetal Heart Rate (bpm): 154   Movement: Present     General:  Alert, oriented and cooperative. Patient is in no acute distress.  Skin: Skin is warm and dry. No rash noted.   Cardiovascular: Normal heart rate noted  Respiratory: Normal respiratory effort, no problems with respiration noted  Abdomen: Soft, gravid, appropriate for gestational age. Pain/Pressure: Present     Pelvic: Vag. Bleeding: None     Cervical exam deferred        Extremities: Normal range of motion.  Edema: None  Mental Status: Normal mood and affect. Normal behavior. Normal judgment and thought content.   Urinalysis: Urine Protein:  Negative Urine Glucose: Negative  Assessment and Plan:  Pregnancy: [id number] at [redacted]w[redacted]d 1. Chronic hypertension during pregnancy, antepartum --No meds, BP wnl, next [place] at 28 weeks for growth  2. Tooth pain --Pt to follow up with dentist as scheduled tomorrow  3. Right acute serous otitis media, recurrence not specified --Significant edema and erythema of right tympanic membrane, scant yellow drainage and dark brown (bleeding?) noted --Azithromycin PO (250 mg x 2 on Day 1, then 250 mg daily x 4 days) x 5 days  4. Chest congestion --Lung sounds clear bilaterally --Azithromycin PO (250 mg x 2 on Day 1, then 250 mg daily x 4 days) x 5 days  5. Abnormal quad screen --Pt due date recalculated based on 18 week [place], contacted [facility] to recalculate Quad screen results based on accurate gestational age.   Preterm labor symptoms and general obstetric precautions including but not limited to vaginal bleeding, contractions, leaking of fluid and fetal movement were reviewed in detail with the patient. Please refer to After Visit Summary for other counseling recommendations.  Return in about 4 weeks (around 10/09/2015).   Elvera Maria, CNM

## 2015-10-09 ENCOUNTER — Encounter: Payer: Medicaid Other | Admitting: Advanced Practice Midwife

## 2015-10-09 ENCOUNTER — Encounter: Payer: Self-pay | Admitting: Advanced Practice Midwife

## 2015-10-09 LAB — POCT URINALYSIS DIP (DEVICE)
Bilirubin Urine: NEGATIVE
Glucose, UA: NEGATIVE mg/dL
Hgb urine dipstick: NEGATIVE
Ketones, ur: NEGATIVE mg/dL
Nitrite: NEGATIVE
Protein, ur: NEGATIVE mg/dL
Specific Gravity, Urine: 1.02 (ref 1.005–1.030)
Urobilinogen, UA: 1 mg/dL (ref 0.0–1.0)
pH: 7 (ref 5.0–8.0)

## 2015-10-11 ENCOUNTER — Other Ambulatory Visit (HOSPITAL_COMMUNITY): Payer: Self-pay

## 2015-10-16 ENCOUNTER — Ambulatory Visit (INDEPENDENT_AMBULATORY_CARE_PROVIDER_SITE_OTHER): Payer: Medicaid Other | Admitting: Advanced Practice Midwife

## 2015-10-16 VITALS — BP 138/71 | HR 87 | Temp 98.0°F | Wt 272.4 lb

## 2015-10-16 DIAGNOSIS — O289 Unspecified abnormal findings on antenatal screening of mother: Secondary | ICD-10-CM

## 2015-10-16 DIAGNOSIS — O10912 Unspecified pre-existing hypertension complicating pregnancy, second trimester: Secondary | ICD-10-CM | POA: Diagnosis present

## 2015-10-16 DIAGNOSIS — O98519 Other viral diseases complicating pregnancy, unspecified trimester: Secondary | ICD-10-CM

## 2015-10-16 DIAGNOSIS — O9989 Other specified diseases and conditions complicating pregnancy, childbirth and the puerperium: Secondary | ICD-10-CM | POA: Diagnosis not present

## 2015-10-16 DIAGNOSIS — A609 Anogenital herpesviral infection, unspecified: Secondary | ICD-10-CM

## 2015-10-16 DIAGNOSIS — O0992 Supervision of high risk pregnancy, unspecified, second trimester: Secondary | ICD-10-CM

## 2015-10-16 DIAGNOSIS — O98319 Other infections with a predominantly sexual mode of transmission complicating pregnancy, unspecified trimester: Secondary | ICD-10-CM

## 2015-10-16 DIAGNOSIS — A6009 Herpesviral infection of other urogenital tract: Secondary | ICD-10-CM

## 2015-10-16 DIAGNOSIS — J069 Acute upper respiratory infection, unspecified: Secondary | ICD-10-CM | POA: Diagnosis not present

## 2015-10-16 DIAGNOSIS — O280 Abnormal hematological finding on antenatal screening of mother: Secondary | ICD-10-CM

## 2015-10-16 LAB — CBC
HCT: 26.5 % — ABNORMAL LOW (ref 36.0–46.0)
Hemoglobin: 8.5 g/dL — ABNORMAL LOW (ref 12.0–15.0)
MCH: 22.5 pg — ABNORMAL LOW (ref 26.0–34.0)
MCHC: 32.1 g/dL (ref 30.0–36.0)
MCV: 70.1 fL — ABNORMAL LOW (ref 78.0–100.0)
MPV: 8 fL — ABNORMAL LOW (ref 8.6–12.4)
Platelets: 200 10*3/uL (ref 150–400)
RBC: 3.78 MIL/uL — ABNORMAL LOW (ref 3.87–5.11)
RDW: 18.6 % — ABNORMAL HIGH (ref 11.5–15.5)
WBC: 4.4 10*3/uL (ref 4.0–10.5)

## 2015-10-16 LAB — POCT URINALYSIS DIP (DEVICE)
Glucose, UA: NEGATIVE mg/dL
Hgb urine dipstick: NEGATIVE
Ketones, ur: 15 mg/dL — AB
Nitrite: NEGATIVE
Protein, ur: 30 mg/dL — AB
Specific Gravity, Urine: 1.02 (ref 1.005–1.030)
Urobilinogen, UA: 2 mg/dL — ABNORMAL HIGH (ref 0.0–1.0)
pH: 6.5 (ref 5.0–8.0)

## 2015-10-16 NOTE — Patient Instructions (Signed)
Safe OTC Cold Medications: Sudafed Tylenol Mucinex Robitussin Saline nasal spray Upper Respiratory Infection, Adult Most upper respiratory infections (URIs) are a viral infection of the air passages leading to the lungs. A URI affects the nose, throat, and upper air passages. The most common type of URI is nasopharyngitis and is typically referred to as "the common cold." URIs run their course and usually go away on their own. Most of the time, a URI does not require medical attention, but sometimes a bacterial infection in the upper airways can follow a viral infection. This is called a secondary infection. Sinus and middle ear infections are common types of secondary upper respiratory infections. Bacterial pneumonia can also complicate a URI. A URI can worsen asthma and chronic obstructive pulmonary disease (COPD). Sometimes, these complications can require emergency medical care and may be life threatening.  CAUSES Almost all URIs are caused by viruses. A virus is a type of germ and can spread from one person to another.  RISKS FACTORS You may be at risk for a URI if:   You smoke.   You have chronic heart or lung disease.  You have a weakened defense (immune) system.   You are very young or very old.   You have nasal allergies or asthma.  You work in crowded or poorly ventilated areas.  You work in health care facilities or schools. SIGNS AND SYMPTOMS  Symptoms typically develop 2-3 days after you come in contact with a cold virus. Most viral URIs last 7-10 days. However, viral URIs from the influenza virus (flu virus) can last 14-18 days and are typically more severe. Symptoms may include:   Runny or stuffy (congested) nose.   Sneezing.   Cough.   Sore throat.   Headache.   Fatigue.   Fever.   Loss of appetite.   Pain in your forehead, behind your eyes, and over your cheekbones (sinus pain).  Muscle aches.  DIAGNOSIS  Your health care provider may  diagnose a URI by:  Physical exam.  Tests to check that your symptoms are not due to another condition such as:  Strep throat.  Sinusitis.  Pneumonia.  Asthma. TREATMENT  A URI goes away on its own with time. It cannot be cured with medicines, but medicines may be prescribed or recommended to relieve symptoms. Medicines may help:  Reduce your fever.  Reduce your cough.  Relieve nasal congestion. HOME CARE INSTRUCTIONS   Take medicines only as directed by your health care provider.   Gargle warm saltwater or take cough drops to comfort your throat as directed by your health care provider.  Use a warm mist humidifier or inhale steam from a shower to increase air moisture. This may make it easier to breathe.  Drink enough fluid to keep your urine clear or pale yellow.   Eat soups and other clear broths and maintain good nutrition.   Rest as needed.   Return to work when your temperature has returned to normal or as your health care provider advises. You may need to stay home longer to avoid infecting others. You can also use a face mask and careful hand washing to prevent spread of the virus.  Increase the usage of your inhaler if you have asthma.   Do not use any tobacco products, including cigarettes, chewing tobacco, or electronic cigarettes. If you need help quitting, ask your health care provider. PREVENTION  The best way to protect yourself from getting a cold is to practice good hygiene.  Avoid oral or hand contact with people with cold symptoms.   Wash your hands often if contact occurs.  There is no clear evidence that vitamin C, vitamin E, echinacea, or exercise reduces the chance of developing a cold. However, it is always recommended to get plenty of rest, exercise, and practice good nutrition.  SEEK MEDICAL CARE IF:   You are getting worse rather than better.   Your symptoms are not controlled by medicine.   You have chills.  You have  worsening shortness of breath.  You have brown or red mucus.  You have yellow or brown nasal discharge.  You have pain in your face, especially when you bend forward.  You have a fever.  You have swollen neck glands.  You have pain while swallowing.  You have white areas in the back of your throat. SEEK IMMEDIATE MEDICAL CARE IF:   You have severe or persistent:  Headache.  Ear pain.  Sinus pain.  Chest pain.  You have chronic lung disease and any of the following:  Wheezing.  Prolonged cough.  Coughing up blood.  A change in your usual mucus.  You have a stiff neck.  You have changes in your:  Vision.  Hearing.  Thinking.  Mood. MAKE SURE YOU:   Understand these instructions.  Will watch your condition.  Will get help right away if you are not doing well or get worse.   This information is not intended to replace advice given to you by your health care provider. Make sure you discuss any questions you have with your health care provider.   Document Released: 01/13/2001 Document Revised: 12/04/2014 Document Reviewed: 10/25/2013 Elsevier Interactive Patient Education [employer].

## 2015-10-16 NOTE — Progress Notes (Signed)
Subjective:  OTTIE GURECKI is a 30 y.o. [id number] at [redacted]w[redacted]d being seen today for ongoing prenatal care.  She is currently monitored for the following issues for this high-risk pregnancy and has Herpes simplex type 2 (HSV-2) infection affecting pregnancy, antepartum; Sickle cell trait ([place]); Chronic hypertension; Supervision of high risk pregnancy, antepartum; Abnormal quad screen; Anemia affecting pregnancy, antepartum; Short interval between pregnancies affecting pregnancy, antepartum; Chronic hypertension during pregnancy, antepartum; Morbid (severe) obesity due to excess calories ([place]); and Asthma, mild intermittent on her problem list.  Patient reports cough, runny nose, congestion, hx of HSV unsure of medications needed.  Contractions: Not present. Vag. Bleeding: None.  Movement: Present. Denies leaking of fluid.   The following portions of the patient's history were reviewed and updated as appropriate: allergies, current medications, past family history, past medical history, past social history, past surgical history and problem list. Problem list updated.  Objective:   Filed Vitals:   10/16/15 0854 10/16/15 0903  BP: 140/78 138/71  Pulse: 93 87  Temp: 98 F (36.7 C)   Weight: 272 lb 6.4 oz (123.56 kg)     Fetal Status: Fetal Heart Rate (bpm): 140 Fundal Height: 26 cm Movement: Present     General:  Alert, oriented and cooperative. Patient is in no acute distress.  Skin: Skin is warm and dry. No rash noted.   Cardiovascular: Normal heart rate noted  Respiratory: Normal respiratory effort, no problems with respiration noted  Abdomen: Soft, gravid, appropriate for gestational age. Pain/Pressure: Present     Pelvic: Vag. Bleeding: None     Cervical exam deferred        Extremities: Normal range of motion.     Mental Status: Normal mood and affect. Normal behavior. Normal judgment and thought content.   Urinalysis: Urine Protein: 1+ Urine Glucose: Negative  Assessment and  Plan:  Pregnancy: [id number] at [redacted]w[redacted]d  1. Chronic hypertension in pregnancy, second trimester  - Glucose Tolerance, 1 HR (50g) w/o Fasting - RPR - CBC - HIV antibody (with reflex)  2. Supervision of high risk pregnancy, antepartum, second trimester  - POCT urinalysis dip (device)  3. Abnormal quad screen  - AMB referral to maternal fetal medicine  4. Viral upper respiratory tract infection --Pt symptoms improving today from previous 2-3 days.  Continue symptom relief measures.  Increase PO fluids.  Reviewed safe OTC medications in pregnancy.   5. Genital herpes affecting pregnancy --No outbreak since previous pregnancy.  Needs acyclovir at 36 weeks.   Preterm labor symptoms and general obstetric precautions including but not limited to vaginal bleeding, contractions, leaking of fluid and fetal movement were reviewed in detail with the patient. Please refer to After Visit Summary for other counseling recommendations.  Return in about 2 weeks (around 10/30/2015).   Elvera Maria, CNM

## 2015-10-16 NOTE — Progress Notes (Signed)
C/o pain right /left lower quadrants few times per day. Urinalysis shows moderate leukocytes. Given 28 week packet. States thought had the flu weekend, states now still has produtive cough of yellow-clear mucous, body aches.  Declines tdap today, may get it at another visit.

## 2015-10-17 LAB — HIV ANTIBODY (ROUTINE TESTING W REFLEX): HIV 1&2 Ab, 4th Generation: NONREACTIVE

## 2015-10-17 LAB — GLUCOSE TOLERANCE, 1 HOUR (50G) W/O FASTING: Glucose, 1 Hr, gestational: 173 mg/dL — ABNORMAL HIGH (ref <140)

## 2015-10-18 ENCOUNTER — Ambulatory Visit (HOSPITAL_COMMUNITY)
Admission: RE | Admit: 2015-10-18 | Discharge: 2015-10-18 | Disposition: A | Discharge status: Home / Self Care | Payer: Medicaid Other | Source: Ambulatory Visit | Attending: Advanced Practice Midwife | Admitting: Advanced Practice Midwife

## 2015-10-18 DIAGNOSIS — D573 Sickle-cell trait: Secondary | ICD-10-CM | POA: Diagnosis not present

## 2015-10-18 DIAGNOSIS — Z315 Encounter for genetic counseling: Secondary | ICD-10-CM | POA: Diagnosis not present

## 2015-10-18 DIAGNOSIS — O10919 Unspecified pre-existing hypertension complicating pregnancy, unspecified trimester: Secondary | ICD-10-CM

## 2015-10-18 DIAGNOSIS — O09899 Supervision of other high risk pregnancies, unspecified trimester: Secondary | ICD-10-CM

## 2015-10-18 DIAGNOSIS — O280 Abnormal hematological finding on antenatal screening of mother: Secondary | ICD-10-CM

## 2015-10-18 DIAGNOSIS — O99012 Anemia complicating pregnancy, second trimester: Secondary | ICD-10-CM | POA: Diagnosis not present

## 2015-10-18 DIAGNOSIS — O283 Abnormal ultrasonic finding on antenatal screening of mother: Secondary | ICD-10-CM | POA: Insufficient documentation

## 2015-10-18 DIAGNOSIS — O99019 Anemia complicating pregnancy, unspecified trimester: Secondary | ICD-10-CM

## 2015-10-18 DIAGNOSIS — O0992 Supervision of high risk pregnancy, unspecified, second trimester: Secondary | ICD-10-CM

## 2015-10-18 DIAGNOSIS — Z3A26 26 weeks gestation of pregnancy: Secondary | ICD-10-CM | POA: Insufficient documentation

## 2015-10-18 LAB — RPR

## 2015-10-18 NOTE — Progress Notes (Signed)
Genetic Counseling  High-Risk Gestation Note  Appointment Date:  10/18/2015 Referred By: Elvera Maria,* Date of Birth:  July 08, 1986 Partner:  [name]   Pregnancy [id number] Estimated Date of Delivery: 01/22/16 Estimated Gestational Age: [redacted]w[redacted]d Attending: Renella Cunas, MD    [name] was seen for genetic counseling because of an increased risk for fetal Down syndrome based on Quad screening through [employer].  In Summary:   Quad screen increased risk for Down syndrome  Detailed ultrasound performed on 08/21/15 changed EDD. Visualized fetal anatomy within normal range and no markers of fetal aneuploidy  Quad screen recalculated with new EDD reduced risk for Down syndrome to 1 in 96 but still considered screen positive  Patient elected to proceed with NIPS (Panorama) today, declined amniocentesis  Patient has sickle cell trait; Reported her husband was screened during her first pregnancy by OB in [place] and does not have sickle cell trait  She was counseled regarding the recalculated Quad screen result and the associated 1 in 96 risk for fetal Down syndrome/trisomy 18.  We reviewed chromosomes, nondisjunction, and the common features and variable prognosis of Down syndrome/features and poor prognosis of trisomy 89.  In addition, we reviewed the screen adjusted reduction in risks for trisomy 18 and ONTDs.  We also discussed other explanations for a screen positive result including: a gestational dating error, differences in maternal metabolism, and normal variation. They understand that this screening is not diagnostic for Down syndrome but provides a risk assessment.  We reviewed available screening options including noninvasive prenatal screening (NIPS)/cell free DNA (cfDNA) testing, and detailed ultrasound.  She was counseled that screening tests are used to modify a patient's a priori risk for aneuploidy, typically based on age. This  estimate provides a pregnancy specific risk assessment. We reviewed the benefits and limitations of each option. Specifically, we discussed the conditions for which each test screens, the detection rates, and false positive rates of each. She was also counseled regarding diagnostic testing via amniocentesis. We reviewed the approximate 1 in [id number] risk for complications for amniocentesis, including spontaneous pregnancy loss.   We reviewed results of her detailed ultrasound from 08/21/15, which was within normal limits. Visualized fetal anatomy was seen at that time, and no markers of fetal aneuploidy were visualized. Complete ultrasound result under separate cover.   After consideration of all the options, she elected to proceed with NIPS (Panorama through [facility]).  Those results will be available in 8-10 days. Amniocentesis was declined in pregnancy given the associated risk for complications.  She understands that screening tests cannot rule out all birth defects or genetic syndromes.   Both family histories were reviewed and found to be contributory for sickle cell trait for the patient and her mother. The patient reported that the father of the pregnancy was screened during the patient's first pregnancy, through her OB provider in [place], and his screening was normal. It was not known if he had screening for additional hemoglobin variants, in addition to hemoglobin S (sickle cell trait) or only for sickle cell trait. We discussed that SCA affects the shape and function of the red blood cell by producing abnormal hemoglobin. Hemoglobin is a protein in the RBCs that carries oxygen to the body's organs. Individuals who have SCA have a changes within the genes that codes for hemoglobin. We reviewed the autosomal recessive inheritance of sickle cell anemia, which occurs when both copies of the hemoglobin gene are changed and produce an abnormal hemoglobin  S. Typically, one abnormal gene for the  production of hemoglobin S is inherited from each parent. A carrier of SCA has one altered copy of the gene for hemoglobin and one typical working copy. Carriers of recessive conditions typically do not have symptoms related to the condition because they still have one functioning copy of the gene, and thus some production of the typical protein coded for by that gene.  We discussed the option of repeat testing (hemoglobin electrophoresis) for the patient's husband to determine whether he has any hemoglobin variant, including SCT. They understand that an accurate risk assessment cannot be performed without further information. However, assuming that Mr. [name] does not have SCT or any other hemoglobin variant, the fetus would not be expected to have an increased risk for a hemoglobinopathy. We also reviewed the availability of newborn screening in [place] for hemoglobinopathies.   [name] reported that her husband was recently diagnosed with schizophrenia. We discussed that for the majority of cases of mental health conditions, such as schizophrenia, an underlying genetic cause is not known but a combination of genetic and environmental factors (multifactorial inheritance) are suspected to contribute to their onset.  Recurrence risk of schizophrenia for offspring of an affected individual is estimated to be approximately 7-16%, in the case of multifactorial inheritance observed. In some families, mental health conditions may even be dominant, meaning that when one parent has the condition each child could have up to a 50% risk to inherit the condition. We discussed that it might be helpful for pediatricians to be aware of this family history to ensure that family members are followed appropriately. The patient understands that prenatal testing or screening is not available for the majority of mental health conditions.   The patient also reported that Mr. [name] maternal half-sister had one baby  who was stillborn, but the cause was not known. We discussed that there can be many underlying causes for stillbirth, and that an underlying cause may not have been determined. Recurrence risk for relatives would likely be low, given the reported family history. However, additional information is needed to assess recurrence risk. This same sister also reportedly died in her late 34's from cancer. The patient was unsure of the specific type, but thought it may have been breast cancer. Though most cancers are thought to be sporadic or due to environmental factors, some families appear to have a strong predisposition to cancers.  When considering a family history of cancer, we look for common types of cancer in multiple family members occurring at younger than typical ages. we discussed the option of meeting with a cancer genetic counselor to discuss any possible screening or testing options available. If they are concerned about the family history of cancer and would like to learn more about the family's chance for an inherited cancer syndrome, his doctor may refer him or his relatives to [facility] [phone]). Without further information regarding the provided family history, an accurate genetic risk cannot be calculated. Further genetic counseling is warranted if more information is obtained.   Mrs. Media S [name] denied exposure to environmental toxins or chemical agents. She denied the use of alcohol, tobacco or street drugs. She denied significant viral illnesses during the course of her pregnancy. Her medical and surgical histories were noncontributory.   I counseled Mrs. Edyth S [name] for approximately 45 minutes regarding the above risks and available options.   Chipper Oman, MS,  Certified Genetic Counselor 10/18/2015

## 2015-10-21 ENCOUNTER — Telehealth: Payer: Self-pay

## 2015-10-21 ENCOUNTER — Encounter: Payer: Self-pay | Admitting: Advanced Practice Midwife

## 2015-10-21 DIAGNOSIS — O9081 Anemia of the puerperium: Secondary | ICD-10-CM | POA: Insufficient documentation

## 2015-10-21 DIAGNOSIS — O99019 Anemia complicating pregnancy, unspecified trimester: Secondary | ICD-10-CM | POA: Insufficient documentation

## 2015-10-21 NOTE — Telephone Encounter (Signed)
Attempted to call patient regarding lab results. I have left message for patient to call us back concerning results.   Pt had 173 on 1 hour GTT, needs 3 hour scheduled. Thank you.

## 2015-10-22 NOTE — Telephone Encounter (Signed)
Pt has been scheduled for appt °

## 2015-10-24 ENCOUNTER — Telehealth (HOSPITAL_COMMUNITY): Payer: Self-pay | Admitting: MS"

## 2015-10-24 ENCOUNTER — Other Ambulatory Visit: Payer: Medicaid Other

## 2015-10-24 NOTE — Telephone Encounter (Signed)
Called Jaylena Mallik White-Moore to discuss her prenatal cell free DNA test results.  Ms. Celissa Ternes White-Moore had Panorama testing through [facility].  Testing was offered because of abnormal maternal serum screen for Down syndrome.   The patient was identified by name and DOB.  We reviewed that these are within normal limits, showing a less than 1 in 10,000 risk for trisomies 21, 18 and 13, and monosomy X (Turner syndrome).  In addition, the risk for triploidy/vanishing twin and sex chromosome trisomies (47,XXX and 47,XXY) was also low risk.  We reviewed that this testing identifies > 99% of pregnancies with trisomy 32, trisomy 73, sex chromosome trisomies (47,XXX and 47,XXY), and triploidy. The detection rate for trisomy 18 is 96%.  The detection rate for monosomy X is ~92%.  The false positive rate is <0.1% for all conditions. Testing was also consistent with female fetal sex.  She understands that this testing does not identify all genetic conditions.  All questions were answered to her satisfaction, she was encouraged to call with additional questions or concerns.  Chipper Oman, MS Certified Genetic Counselor 10/24/2015 12:05 PM

## 2015-10-25 ENCOUNTER — Ambulatory Visit (HOSPITAL_COMMUNITY)
Admission: RE | Admit: 2015-10-25 | Discharge: 2015-10-25 | Disposition: A | Discharge status: Home / Self Care | Payer: Medicaid Other | Source: Ambulatory Visit | Attending: Advanced Practice Midwife | Admitting: Advanced Practice Midwife

## 2015-10-25 ENCOUNTER — Encounter (HOSPITAL_COMMUNITY): Payer: Self-pay

## 2015-10-25 VITALS — BP 126/56 | HR 86 | Wt 276.0 lb

## 2015-10-25 DIAGNOSIS — O0992 Supervision of high risk pregnancy, unspecified, second trimester: Secondary | ICD-10-CM

## 2015-10-25 DIAGNOSIS — Z3A27 27 weeks gestation of pregnancy: Secondary | ICD-10-CM | POA: Diagnosis not present

## 2015-10-25 DIAGNOSIS — O09899 Supervision of other high risk pregnancies, unspecified trimester: Secondary | ICD-10-CM

## 2015-10-25 DIAGNOSIS — O10012 Pre-existing essential hypertension complicating pregnancy, second trimester: Secondary | ICD-10-CM | POA: Diagnosis present

## 2015-10-25 DIAGNOSIS — O280 Abnormal hematological finding on antenatal screening of mother: Secondary | ICD-10-CM

## 2015-10-25 DIAGNOSIS — O99019 Anemia complicating pregnancy, unspecified trimester: Secondary | ICD-10-CM

## 2015-10-25 DIAGNOSIS — O283 Abnormal ultrasonic finding on antenatal screening of mother: Secondary | ICD-10-CM | POA: Diagnosis not present

## 2015-10-25 DIAGNOSIS — O10919 Unspecified pre-existing hypertension complicating pregnancy, unspecified trimester: Secondary | ICD-10-CM

## 2015-10-28 ENCOUNTER — Other Ambulatory Visit (HOSPITAL_COMMUNITY): Payer: Self-pay

## 2015-10-29 ENCOUNTER — Other Ambulatory Visit: Payer: Medicaid Other

## 2015-11-06 ENCOUNTER — Encounter: Payer: Medicaid Other | Admitting: Advanced Practice Midwife

## 2015-11-13 ENCOUNTER — Encounter: Payer: Medicaid Other | Admitting: Advanced Practice Midwife

## 2015-11-18 ENCOUNTER — Encounter: Payer: Self-pay | Admitting: Family Medicine

## 2015-11-18 ENCOUNTER — Ambulatory Visit (INDEPENDENT_AMBULATORY_CARE_PROVIDER_SITE_OTHER): Payer: Medicaid Other | Admitting: Student

## 2015-11-18 VITALS — BP 126/71 | HR 101 | Wt 266.0 lb

## 2015-11-18 DIAGNOSIS — O0993 Supervision of high risk pregnancy, unspecified, third trimester: Secondary | ICD-10-CM | POA: Diagnosis not present

## 2015-11-18 LAB — POCT URINALYSIS DIP (DEVICE)
Bilirubin Urine: NEGATIVE
Glucose, UA: NEGATIVE mg/dL
Hgb urine dipstick: NEGATIVE
Ketones, ur: 15 mg/dL — AB
Leukocytes, UA: NEGATIVE
Nitrite: NEGATIVE
Protein, ur: NEGATIVE mg/dL
Specific Gravity, Urine: 1.02 (ref 1.005–1.030)
Urobilinogen, UA: 1 mg/dL (ref 0.0–1.0)
pH: 6 (ref 5.0–8.0)

## 2015-11-18 NOTE — Progress Notes (Signed)
Patient reports contractions over the weekend with severe pain during contractions, states they stopped Sunday night; also reports sharp pains around groin/hip and pelvic pressure

## 2015-11-18 NOTE — Patient Instructions (Signed)
Third Trimester of Pregnancy The third trimester is from week 29 through week 42, months 7 through 9. The third trimester is a time when the fetus is growing rapidly. At the end of the ninth month, the fetus is about 20 inches in length and weighs 6-10 pounds.  BODY CHANGES Your body goes through many changes during pregnancy. The changes vary from woman to woman.   Your weight will continue to increase. You can expect to gain 25-35 pounds (11-16 kg) by the end of the pregnancy.  You may begin to get stretch marks on your hips, abdomen, and breasts.  You may urinate more often because the fetus is moving lower into your pelvis and pressing on your bladder.  You may develop or continue to have heartburn as a result of your pregnancy.  You may develop constipation because certain hormones are causing the muscles that push waste through your intestines to slow down.  You may develop hemorrhoids or swollen, bulging veins (varicose veins).  You may have pelvic pain because of the weight gain and pregnancy hormones relaxing your joints between the bones in your pelvis. Backaches may result from overexertion of the muscles supporting your posture.  You may have changes in your hair. These can include thickening of your hair, rapid growth, and changes in texture. Some women also have hair loss during or after pregnancy, or hair that feels dry or thin. Your hair will most likely return to normal after your baby is born.  Your breasts will continue to grow and be tender. A yellow discharge may leak from your breasts called colostrum.  Your belly button may stick out.  You may feel short of breath because of your expanding uterus.  You may notice the fetus "dropping," or moving lower in your abdomen.  You may have a bloody mucus discharge. This usually occurs a few days to a week before labor begins.  Your cervix becomes thin and soft (effaced) near your due date. WHAT TO EXPECT AT YOUR PRENATAL  EXAMS  You will have prenatal exams every 2 weeks until week 36. Then, you will have weekly prenatal exams. During a routine prenatal visit:  You will be weighed to make sure you and the fetus are growing normally.  Your blood pressure is taken.  Your abdomen will be measured to track your baby's growth.  The fetal heartbeat will be listened to.  Any test results from the previous visit will be discussed.  You may have a cervical check near your due date to see if you have effaced. At around 36 weeks, your caregiver will check your cervix. At the same time, your caregiver will also perform a test on the secretions of the vaginal tissue. This test is to determine if a type of bacteria, Group B streptococcus, is present. Your caregiver will explain this further. Your caregiver may ask you:  What your birth plan is.  How you are feeling.  If you are feeling the baby move.  If you have had any abnormal symptoms, such as leaking fluid, bleeding, severe headaches, or abdominal cramping.  If you are using any tobacco products, including cigarettes, chewing tobacco, and electronic cigarettes.  If you have any questions. Other tests or screenings that may be performed during your third trimester include:  Blood tests that check for low iron levels (anemia).  Fetal testing to check the health, activity level, and growth of the fetus. Testing is done if you have certain medical conditions or if  there are problems during the pregnancy.  HIV (human immunodeficiency virus) testing. If you are at high risk, you may be screened for HIV during your third trimester of pregnancy. FALSE LABOR You may feel small, irregular contractions that eventually go away. These are called Braxton Hicks contractions, or false labor. Contractions may last for hours, days, or even weeks before true labor sets in. If contractions come at regular intervals, intensify, or become painful, it is best to be seen by your  caregiver.  SIGNS OF LABOR   Menstrual-like cramps.  Contractions that are 5 minutes apart or less.  Contractions that start on the top of the uterus and spread down to the lower abdomen and back.  A sense of increased pelvic pressure or back pain.  A watery or bloody mucus discharge that comes from the vagina. If you have any of these signs before the 37th week of pregnancy, call your caregiver right away. You need to go to the hospital to get checked immediately. HOME CARE INSTRUCTIONS   Avoid all smoking, herbs, alcohol, and unprescribed drugs. These chemicals affect the formation and growth of the baby.  Do not use any tobacco products, including cigarettes, chewing tobacco, and electronic cigarettes. If you need help quitting, ask your health care provider. You may receive counseling support and other resources to help you quit.  Follow your caregiver's instructions regarding medicine use. There are medicines that are either safe or unsafe to take during pregnancy.  Exercise only as directed by your caregiver. Experiencing uterine cramps is a good sign to stop exercising.  Continue to eat regular, healthy meals.  Wear a good support bra for breast tenderness.  Do not use hot tubs, steam rooms, or saunas.  Wear your seat belt at all times when driving.  Avoid raw meat, uncooked cheese, cat litter boxes, and soil used by cats. These carry germs that can cause birth defects in the baby.  Take your prenatal vitamins.  Take 1500-2000 mg of calcium daily starting at the 20th week of pregnancy until you deliver your baby.  Try taking a stool softener (if your caregiver approves) if you develop constipation. Eat more high-fiber foods, such as fresh vegetables or fruit and whole grains. Drink plenty of fluids to keep your urine clear or pale yellow.  Take warm sitz baths to soothe any pain or discomfort caused by hemorrhoids. Use hemorrhoid cream if your caregiver approves.  If  you develop varicose veins, wear support hose. Elevate your feet for 15 minutes, 3-4 times a day. Limit salt in your diet.  Avoid heavy lifting, wear low heal shoes, and practice good posture.  Rest a lot with your legs elevated if you have leg cramps or low back pain.  Visit your dentist if you have not gone during your pregnancy. Use a soft toothbrush to brush your teeth and be gentle when you floss.  A sexual relationship may be continued unless your caregiver directs you otherwise.  Do not travel far distances unless it is absolutely necessary and only with the approval of your caregiver.  Take prenatal classes to understand, practice, and ask questions about the labor and delivery.  Make a trial run to the hospital.  Pack your hospital bag.  Prepare the baby's nursery.  Continue to go to all your prenatal visits as directed by your caregiver. SEEK MEDICAL CARE IF:  You are unsure if you are in labor or if your water has broken.  You have dizziness.  You have  mild pelvic cramps, pelvic pressure, or nagging pain in your abdominal area.  You have persistent nausea, vomiting, or diarrhea.  You have a bad smelling vaginal discharge.  You have pain with urination. SEEK IMMEDIATE MEDICAL CARE IF:   You have a fever.  You are leaking fluid from your vagina.  You have spotting or bleeding from your vagina.  You have severe abdominal cramping or pain.  You have rapid weight loss or gain.  You have shortness of breath with chest pain.  You notice sudden or extreme swelling of your face, hands, ankles, feet, or legs.  You have not felt your baby move in over an hour.  You have severe headaches that do not go away with medicine.  You have vision changes.   This information is not intended to replace advice given to you by your health care provider. Make sure you discuss any questions you have with your health care provider.   Document Released: 07/14/2001 Document  Revised: 08/10/2014 Document Reviewed: 09/20/2012 Elsevier Interactive Patient Education 2016 [employer]. Preterm Labor Information Preterm labor is when labor starts at less than 37 weeks of pregnancy. The normal length of a pregnancy is 39 to 41 weeks. CAUSES Often, there is no identifiable underlying cause as to why a woman goes into preterm labor. One of the most common known causes of preterm labor is infection. Infections of the uterus, cervix, vagina, amniotic sac, bladder, kidney, or even the lungs (pneumonia) can cause labor to start. Other suspected causes of preterm labor include:   Urogenital infections, such as yeast infections and bacterial vaginosis.   Uterine abnormalities (uterine shape, uterine septum, fibroids, or bleeding from the placenta).   A cervix that has been operated on (it may fail to stay closed).   Malformations in the fetus.   Multiple gestations (twins, triplets, and so on).   Breakage of the amniotic sac.  RISK FACTORS  Having a previous history of preterm labor.   Having premature rupture of membranes (PROM).   Having a placenta that covers the opening of the cervix (placenta previa).   Having a placenta that separates from the uterus (placental abruption).   Having a cervix that is too weak to hold the fetus in the uterus (incompetent cervix).   Having too much fluid in the amniotic sac (polyhydramnios).   Taking illegal drugs or smoking while pregnant.   Not gaining enough weight while pregnant.   Being younger than 63 and older than 29 years old.   Having a low socioeconomic status.   Being African American. SYMPTOMS Signs and symptoms of preterm labor include:   Menstrual-like cramps, abdominal pain, or back pain.  Uterine contractions that are regular, as frequent as six in an hour, regardless of their intensity (may be mild or painful).  Contractions that start on the top of the uterus and spread down to the  lower abdomen and back.   A sense of increased pelvic pressure.   A watery or bloody mucus discharge that comes from the vagina.  TREATMENT Depending on the length of the pregnancy and other circumstances, your health care provider may suggest bed rest. If necessary, there are medicines that can be given to stop contractions and to mature the fetal lungs. If labor happens before 34 weeks of pregnancy, a prolonged hospital stay may be recommended. Treatment depends on the condition of both you and the fetus.  WHAT SHOULD YOU DO IF YOU THINK YOU ARE IN PRETERM LABOR? Call your  health care provider right away. You will need to go to the hospital to get checked immediately. HOW CAN YOU PREVENT PRETERM LABOR IN FUTURE PREGNANCIES? You should:   Stop smoking if you smoke.  Maintain healthy weight gain and avoid chemicals and drugs that are not necessary.  Be watchful for any type of infection.  Inform your health care provider if you have a known history of preterm labor.   This information is not intended to replace advice given to you by your health care provider. Make sure you discuss any questions you have with your health care provider.   Document Released: 10/10/2003 Document Revised: 03/22/2013 Document Reviewed: 08/22/2012 Elsevier Interactive Patient Education [employer].

## 2015-11-18 NOTE — Progress Notes (Signed)
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  She is currently monitored for the following issues for this high-risk pregnancy and has Herpes simplex type 2 (HSV-2) infection affecting pregnancy, antepartum; Sickle cell trait ([place]); Chronic hypertension; Supervision of high risk pregnancy, antepartum; Abnormal quad screen; Anemia affecting pregnancy, antepartum; Short interval between pregnancies affecting pregnancy, antepartum; Chronic hypertension during pregnancy, antepartum; Morbid (severe) obesity due to excess calories ([place]); Asthma, mild intermittent; [redacted] weeks gestation of pregnancy; and Anemia affecting pregnancy on her problem list.  Patient reports no complaints.  Contractions: Irritability. Vag. Bleeding: None.  Movement: Present. Denies leaking of fluid.   The following portions of the patient's history were reviewed and updated as appropriate: allergies, current medications, past family history, past medical history, past social history, past surgical history and problem list. Problem list updated.  Objective:   Filed Vitals:   11/18/15 1547  BP: 126/71  Pulse: 101  Weight: 266 lb (120.657 kg)    Fetal Status: Fetal Heart Rate (bpm): 140   Movement: Present     General:  Alert, oriented and cooperative. Patient is in no acute distress.  Skin: Skin is warm and dry. No rash noted.   Cardiovascular: Normal heart rate noted  Respiratory: Normal respiratory effort, no problems with respiration noted  Abdomen: Soft, gravid, appropriate for gestational age. Fundal height 31 cm  Pelvic: Vag. Bleeding: None     Cervical exam deferred        Extremities: Normal range of motion.  Edema: None  Mental Status: Normal mood and affect. Normal behavior. Normal judgment and thought content.   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 Supervision of  high risk pregnancy, antepartum, third trimester   Preterm labor symptoms and general obstetric precautions including but not limited to vaginal bleeding, contractions, leaking of fluid and fetal movement were reviewed in detail with the patient. Please refer to After Visit Summary for other counseling recommendations.  Return in about 2 weeks (around 12/02/2015) for Routine OB.   Jorje Guild, NP

## 2015-11-22 ENCOUNTER — Ambulatory Visit (HOSPITAL_COMMUNITY)
Admission: RE | Admit: 2015-11-22 | Discharge: 2015-11-22 | Disposition: A | Discharge status: Home / Self Care | Payer: Medicaid Other | Source: Ambulatory Visit | Attending: Advanced Practice Midwife | Admitting: Advanced Practice Midwife

## 2015-11-22 ENCOUNTER — Encounter (HOSPITAL_COMMUNITY): Payer: Self-pay

## 2015-11-22 VITALS — BP 111/62 | HR 96 | Wt 267.5 lb

## 2015-11-22 DIAGNOSIS — O10013 Pre-existing essential hypertension complicating pregnancy, third trimester: Secondary | ICD-10-CM | POA: Insufficient documentation

## 2015-11-22 DIAGNOSIS — Z3A31 31 weeks gestation of pregnancy: Secondary | ICD-10-CM | POA: Diagnosis not present

## 2015-11-22 DIAGNOSIS — Z862 Personal history of diseases of the blood and blood-forming organs and certain disorders involving the immune mechanism: Secondary | ICD-10-CM | POA: Insufficient documentation

## 2015-11-22 DIAGNOSIS — O99019 Anemia complicating pregnancy, unspecified trimester: Secondary | ICD-10-CM

## 2015-11-22 DIAGNOSIS — O09899 Supervision of other high risk pregnancies, unspecified trimester: Secondary | ICD-10-CM

## 2015-11-22 DIAGNOSIS — O09893 Supervision of other high risk pregnancies, third trimester: Secondary | ICD-10-CM | POA: Insufficient documentation

## 2015-11-22 DIAGNOSIS — O10919 Unspecified pre-existing hypertension complicating pregnancy, unspecified trimester: Secondary | ICD-10-CM

## 2015-11-22 DIAGNOSIS — O280 Abnormal hematological finding on antenatal screening of mother: Secondary | ICD-10-CM

## 2015-11-22 DIAGNOSIS — O0993 Supervision of high risk pregnancy, unspecified, third trimester: Secondary | ICD-10-CM

## 2015-11-29 ENCOUNTER — Encounter (HOSPITAL_COMMUNITY): Payer: Self-pay | Admitting: *Deleted

## 2015-11-29 ENCOUNTER — Inpatient Hospital Stay (HOSPITAL_COMMUNITY)
Admission: AD | Admit: 2015-11-29 | Discharge: 2015-11-29 | Disposition: A | Discharge status: Home / Self Care | Payer: Medicaid Other | Source: Ambulatory Visit | Attending: Obstetrics & Gynecology | Admitting: Obstetrics & Gynecology

## 2015-11-29 DIAGNOSIS — J45909 Unspecified asthma, uncomplicated: Secondary | ICD-10-CM | POA: Diagnosis not present

## 2015-11-29 DIAGNOSIS — Z87891 Personal history of nicotine dependence: Secondary | ICD-10-CM | POA: Diagnosis not present

## 2015-11-29 DIAGNOSIS — O0993 Supervision of high risk pregnancy, unspecified, third trimester: Secondary | ICD-10-CM

## 2015-11-29 DIAGNOSIS — Z3A32 32 weeks gestation of pregnancy: Secondary | ICD-10-CM | POA: Diagnosis not present

## 2015-11-29 DIAGNOSIS — O99513 Diseases of the respiratory system complicating pregnancy, third trimester: Secondary | ICD-10-CM | POA: Insufficient documentation

## 2015-11-29 DIAGNOSIS — O280 Abnormal hematological finding on antenatal screening of mother: Secondary | ICD-10-CM

## 2015-11-29 DIAGNOSIS — R102 Pelvic and perineal pain: Secondary | ICD-10-CM | POA: Insufficient documentation

## 2015-11-29 DIAGNOSIS — R109 Unspecified abdominal pain: Secondary | ICD-10-CM | POA: Diagnosis present

## 2015-11-29 DIAGNOSIS — O09899 Supervision of other high risk pregnancies, unspecified trimester: Secondary | ICD-10-CM

## 2015-11-29 DIAGNOSIS — O10013 Pre-existing essential hypertension complicating pregnancy, third trimester: Secondary | ICD-10-CM | POA: Insufficient documentation

## 2015-11-29 DIAGNOSIS — O99019 Anemia complicating pregnancy, unspecified trimester: Secondary | ICD-10-CM

## 2015-11-29 DIAGNOSIS — O26893 Other specified pregnancy related conditions, third trimester: Secondary | ICD-10-CM | POA: Insufficient documentation

## 2015-11-29 DIAGNOSIS — O10919 Unspecified pre-existing hypertension complicating pregnancy, unspecified trimester: Secondary | ICD-10-CM

## 2015-11-29 LAB — URINALYSIS, ROUTINE W REFLEX MICROSCOPIC
Bilirubin Urine: NEGATIVE
Glucose, UA: NEGATIVE mg/dL
Hgb urine dipstick: NEGATIVE
Ketones, ur: 15 mg/dL — AB
Nitrite: NEGATIVE
Protein, ur: NEGATIVE mg/dL
Specific Gravity, Urine: 1.015 (ref 1.005–1.030)
pH: 6 (ref 5.0–8.0)

## 2015-11-29 LAB — URINE MICROSCOPIC-ADD ON: RBC / HPF: NONE SEEN RBC/hpf (ref 0–5)

## 2015-11-29 NOTE — MAU Provider Note (Signed)
History     CSN: [id number]  Arrival date and time: 11/29/15 [id number]   First Provider Initiated Contact with Patient 11/29/15 1042      No chief complaint on file.  HPI [name] is a 29yo [id number] at 32+2 who presents with abdominal/back pain for the past week.  It seems to come on when she sits up or moves around too much and wraps from her back to her abdomen.  It is sharp for a moment or two and then is somewhat dull.  Sometimes it gets sharp again 20 minutes later or so.  She denies LOF, VB, or decreased FM.   Past Medical History  Diagnosis Date  . Asthma   . Trichomonas vaginitis   . Herpes   . Hypertension     Past Surgical History  Procedure Laterality Date  . Dilation and curettage of uterus      Family History  Problem Relation Age of Onset  . Diabetes Mother   . Hypertension Mother   . Hyperlipidemia Mother   . Heart disease Mother   . Diabetes Sister   . Diabetes Brother     Social History  Substance Use Topics  . Smoking status: Former Smoker    Types: Cigarettes  . Smokeless tobacco: Never Used  . Alcohol Use: Yes     Comment: occasional    Allergies: No Known Allergies  Prescriptions prior to admission  Medication Sig Dispense Refill Last Dose  . albuterol (PROVENTIL HFA;VENTOLIN HFA) 108 (90 BASE) MCG/ACT inhaler Inhale 2 puffs into the lungs every 6 (six) hours as needed for wheezing or shortness of breath. (Patient taking differently: Inhale 2 puffs into the lungs every 6 (six) hours as needed for shortness of breath (emergency medication for shortness of breath). ) 1 Inhaler 2 Past Month at Unknown time  . ibuprofen (ADVIL,MOTRIN) 200 MG tablet Take 200 mg by mouth every 6 (six) hours as needed for headache.   Past Week at Unknown time    ROS  No fevers/chills + headache No chest pain Occasional SOB No dysuria/hematuria Physical Exam   Blood pressure 129/73, pulse 92, temperature 97.9 F (36.6 C), temperature source Oral, resp.  rate 18, height 5\' 8"  (1.727 m), weight 223 lb (101.152 kg), last menstrual period 04/08/2015, not currently breastfeeding.  Physical Exam Gen: alert, tired but in NAD HEENT: NCAT, normal conjunctivae, moist oral mucosa Chest: normal WOB, lungs CTAB CV: normal rate and regular rhythm, normal S1 and S2, no m/r/g GU: SVE closed/thick/high Skin: no rashes or lesions noted Psych: cooperative, appropriate affect  FHTs: baseline 150bpm, moderate variability, +accels, one shallow decel at beginning of strip without repeat decels  MAU Course  Procedures  MDM Patient presents with abdominal pain more c/w round ligament pain than contractions.  No evidence of infection or other concerning symptoms.  SVE closed/thick/high and no contractions on toco.    Assessment and Plan  29yo [id number] at 32+2 who presents with abdominal/back pain c/w round ligament pain.  -- Not in labor -- Discussed symptomatic treatment of round ligament pain -- Gave labor precautions -- Encouraged patient to f/u as previously scheduled in clinic for further care -- Discharge home  Tessie Fass 11/29/2015, 11:08 AM   OB fellow attestation:  I have seen and examined this patient; I agree with above documentation in the resident's note.   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  +FM, denies LOF, VB, contractions, vaginal discharge.  PE: BP  129/73 mmHg  Pulse 92  Temp(Src) 97.9 F (36.6 C) (Oral)  Resp 18  Ht 5\' 8"  (1.727 m)  Wt 223 lb (101.152 kg)  BMI 33.91 kg/m2  LMP 04/08/2015 Gen: calm comfortable, NAD Resp: normal effort, no distress Abd: gravid  ROS, labs, PMH reviewed NST reactive   Plan: - fetal kick counts reinforced, preterm labor precautions - Discussed round ligament pain - continue routine follow up in OB clinic - patient with cHTN, will likely need twice weekly testing  Caren Macadam, MD , MPH, [facility] Family Medicine, OB Fellow [facility]  . Attending: Verita Schneiders MD

## 2015-11-29 NOTE — Discharge Instructions (Signed)
Round Ligament Pain  The round ligament is a cord of muscle and tissue that helps to support the uterus. It can become a source of pain during pregnancy if it becomes stretched or twisted as the baby grows. The pain usually begins in the second trimester of pregnancy, and it can come and go until the baby is delivered. It is not a serious problem, and it does not cause harm to the baby.  Round ligament pain is usually a short, sharp, and pinching pain, but it can also be a dull, lingering, and aching pain. The pain is felt in the lower side of the abdomen or in the groin. It usually starts deep in the groin and moves up to the outside of the hip area. Pain can occur with:   A sudden change in position.   Rolling over in bed.   Coughing or sneezing.   Physical activity.  HOME CARE INSTRUCTIONS  Watch your condition for any changes. Take these steps to help with your pain:   When the pain starts, relax. Then try:    Sitting down.    Flexing your knees up to your abdomen.    Lying on your side with one pillow under your abdomen and another pillow between your legs.    Sitting in a warm bath for 15-20 minutes or until the pain goes away.   Take over-the-counter and prescription medicines only as told by your health care provider.   Move slowly when you sit and stand.   Avoid long walks if they cause pain.   Stop or lessen your physical activities if they cause pain.  SEEK MEDICAL CARE IF:   Your pain does not go away with treatment.   You feel pain in your back that you did not have before.   Your medicine is not helping.  SEEK IMMEDIATE MEDICAL CARE IF:   You develop a fever or chills.   You develop uterine contractions.   You develop vaginal bleeding.   You develop nausea or vomiting.   You develop diarrhea.   You have pain when you urinate.     This information is not intended to replace advice given to you by your health care provider. Make sure you discuss any questions you have with your health  care provider.     Document Released: 04/28/2008 Document Revised: 10/12/2011 Document Reviewed: 09/26/2014  Elsevier Interactive Patient Education 2016 Elsevier Inc[employer]

## 2015-11-29 NOTE — MAU Note (Signed)
FOB acting very strange.  Answering questions for pt, making decisions on what pt should drink, acting very odd.  FOB at discharged stated they planned on having a home delivery by him and wanted to know if we had midwives who could assist.  RN dicussed dangers of trying to have a home delivery and addressed we didn't have midwives that would do that.  Then after discussion, FOB stated, "We didn't even come here for that" "I'm just learning how to talk".  FOB was pacing outside room.  Pt didn't address conversation at all.

## 2015-11-29 NOTE — MAU Note (Signed)
Pt c/o sharp and shooting pain in her low abd for the past week, worse with activity. NO vag bleeding or leaking. Reports good fetal movement.

## 2015-12-03 ENCOUNTER — Encounter: Payer: Self-pay | Admitting: Advanced Practice Midwife

## 2015-12-03 ENCOUNTER — Ambulatory Visit (INDEPENDENT_AMBULATORY_CARE_PROVIDER_SITE_OTHER): Payer: Medicaid Other | Admitting: Advanced Practice Midwife

## 2015-12-03 VITALS — BP 117/57 | HR 88 | Wt 268.4 lb

## 2015-12-03 DIAGNOSIS — O0993 Supervision of high risk pregnancy, unspecified, third trimester: Secondary | ICD-10-CM | POA: Diagnosis not present

## 2015-12-03 LAB — POCT URINALYSIS DIP (DEVICE)
Bilirubin Urine: NEGATIVE
Glucose, UA: NEGATIVE mg/dL
Hgb urine dipstick: NEGATIVE
Ketones, ur: NEGATIVE mg/dL
Nitrite: NEGATIVE
Protein, ur: NEGATIVE mg/dL
Specific Gravity, Urine: 1.015 (ref 1.005–1.030)
Urobilinogen, UA: 1 mg/dL (ref 0.0–1.0)
pH: 6.5 (ref 5.0–8.0)

## 2015-12-03 NOTE — Patient Instructions (Signed)

## 2015-12-03 NOTE — Progress Notes (Signed)
Subjective:  [name] is a 30 y.o. [phone] at [redacted]w[redacted]d being seen today for ongoing prenatal care.  She is currently monitored for the following issues for this high-risk pregnancy and has Herpes simplex type 2 (HSV-2) infection affecting pregnancy, antepartum; Sickle cell trait ([place]); Chronic hypertension; Supervision of high risk pregnancy, antepartum; Abnormal quad screen; Anemia affecting pregnancy, antepartum; Short interval between pregnancies affecting pregnancy, antepartum; Chronic hypertension during pregnancy, antepartum; Morbid (severe) obesity due to excess calories ([place]); and Asthma, mild intermittent on her problem list.  Patient reports no complaints and round ligament pain.  Contractions: Not present. Vag. Bleeding: None.  Movement: Present. Denies leaking of fluid.   The following portions of the patient's history were reviewed and updated as appropriate: allergies, current medications, past family history, past medical history, past social history, past surgical history and problem list. Problem list updated.  Objective:   Filed Vitals:   12/03/15 0921  BP: 117/57  Pulse: 88  Weight: 268 lb 6.4 oz (121.745 kg)    Fetal Status: Fetal Heart Rate (bpm): 148   Movement: Present     General:  Alert, oriented and cooperative. Patient is in no acute distress.  Skin: Skin is warm and dry. No rash noted.   Cardiovascular: Normal heart rate noted  Respiratory: Normal respiratory effort, no problems with respiration noted  Abdomen: Soft, gravid, appropriate for gestational age. Pain/Pressure: Present     Pelvic: Vag. Bleeding: None Vag D/C Character: Mucous   Cervical exam deferred        Extremities: Normal range of motion.  Edema: None  Mental Status: Normal mood and affect. Normal behavior. Normal judgment and thought content.   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 Supervision of high  risk pregnancy, antepartum, third trimester      Discussed pregnancy support belt      PTL precautions 2.   Round ligament pain      Discussed support belt  Preterm labor symptoms and general obstetric precautions including but not limited to vaginal bleeding, contractions, leaking of fluid and fetal movement were reviewed in detail with the patient. Please refer to After Visit Summary for other counseling recommendations.  Return in about 2 weeks (around 12/17/2015) for [facility].   Seabron Spates, CNM

## 2015-12-03 NOTE — Progress Notes (Signed)
Patient wants letter for work today.  Provided

## 2015-12-05 ENCOUNTER — Other Ambulatory Visit: Payer: Medicaid Other

## 2015-12-06 ENCOUNTER — Ambulatory Visit (INDEPENDENT_AMBULATORY_CARE_PROVIDER_SITE_OTHER): Payer: Medicaid Other | Admitting: *Deleted

## 2015-12-06 DIAGNOSIS — O10912 Unspecified pre-existing hypertension complicating pregnancy, second trimester: Secondary | ICD-10-CM | POA: Diagnosis not present

## 2015-12-06 DIAGNOSIS — O10919 Unspecified pre-existing hypertension complicating pregnancy, unspecified trimester: Secondary | ICD-10-CM

## 2015-12-06 NOTE — Progress Notes (Signed)
NST performed today was reviewed and was found to be reactive.  Continue recommended antenatal testing and prenatal care.  

## 2015-12-10 ENCOUNTER — Ambulatory Visit (INDEPENDENT_AMBULATORY_CARE_PROVIDER_SITE_OTHER): Payer: Medicaid Other | Admitting: *Deleted

## 2015-12-10 VITALS — BP 130/64 | HR 95

## 2015-12-10 DIAGNOSIS — O10919 Unspecified pre-existing hypertension complicating pregnancy, unspecified trimester: Secondary | ICD-10-CM

## 2015-12-10 DIAGNOSIS — O10912 Unspecified pre-existing hypertension complicating pregnancy, second trimester: Secondary | ICD-10-CM | POA: Diagnosis not present

## 2015-12-10 DIAGNOSIS — Z36 Encounter for antenatal screening of mother: Secondary | ICD-10-CM | POA: Diagnosis not present

## 2015-12-10 NOTE — Progress Notes (Signed)
NST performed today was reviewed and was found to be reactive.  AFI was also normal.  Continue recommended antenatal testing and prenatal care.

## 2015-12-12 ENCOUNTER — Inpatient Hospital Stay (HOSPITAL_COMMUNITY)
Admission: AD | Admit: 2015-12-12 | Discharge: 2015-12-12 | Disposition: A | Discharge status: Home / Self Care | Payer: Medicaid Other | Source: Ambulatory Visit | Attending: Obstetrics and Gynecology | Admitting: Obstetrics and Gynecology

## 2015-12-12 ENCOUNTER — Encounter (HOSPITAL_COMMUNITY): Payer: Self-pay | Admitting: *Deleted

## 2015-12-12 ENCOUNTER — Other Ambulatory Visit: Payer: Medicaid Other

## 2015-12-12 DIAGNOSIS — O10913 Unspecified pre-existing hypertension complicating pregnancy, third trimester: Secondary | ICD-10-CM | POA: Insufficient documentation

## 2015-12-12 DIAGNOSIS — O219 Vomiting of pregnancy, unspecified: Secondary | ICD-10-CM

## 2015-12-12 DIAGNOSIS — Z3A34 34 weeks gestation of pregnancy: Secondary | ICD-10-CM | POA: Diagnosis not present

## 2015-12-12 DIAGNOSIS — O9989 Other specified diseases and conditions complicating pregnancy, childbirth and the puerperium: Secondary | ICD-10-CM

## 2015-12-12 DIAGNOSIS — R109 Unspecified abdominal pain: Secondary | ICD-10-CM

## 2015-12-12 DIAGNOSIS — R102 Pelvic and perineal pain: Secondary | ICD-10-CM

## 2015-12-12 DIAGNOSIS — R112 Nausea with vomiting, unspecified: Secondary | ICD-10-CM | POA: Insufficient documentation

## 2015-12-12 DIAGNOSIS — Z87891 Personal history of nicotine dependence: Secondary | ICD-10-CM | POA: Insufficient documentation

## 2015-12-12 DIAGNOSIS — O26893 Other specified pregnancy related conditions, third trimester: Secondary | ICD-10-CM | POA: Insufficient documentation

## 2015-12-12 DIAGNOSIS — O26899 Other specified pregnancy related conditions, unspecified trimester: Secondary | ICD-10-CM

## 2015-12-12 LAB — URINALYSIS, ROUTINE W REFLEX MICROSCOPIC
Bilirubin Urine: NEGATIVE
Glucose, UA: NEGATIVE mg/dL
Hgb urine dipstick: NEGATIVE
Ketones, ur: NEGATIVE mg/dL
Nitrite: NEGATIVE
Protein, ur: NEGATIVE mg/dL
Specific Gravity, Urine: 1.01 (ref 1.005–1.030)
pH: 6.5 (ref 5.0–8.0)

## 2015-12-12 LAB — WET PREP, GENITAL
Clue Cells Wet Prep HPF POC: NONE SEEN
Sperm: NONE SEEN
Trich, Wet Prep: NONE SEEN
Yeast Wet Prep HPF POC: NONE SEEN

## 2015-12-12 LAB — URINE MICROSCOPIC-ADD ON
Bacteria, UA: NONE SEEN
RBC / HPF: NONE SEEN RBC/hpf (ref 0–5)

## 2015-12-12 MED ORDER — CYCLOBENZAPRINE HCL 5 MG PO TABS: 5.0000 mg | ORAL_TABLET | Freq: Two times a day (BID) | ORAL | Status: DC | PRN

## 2015-12-12 MED ORDER — ONDANSETRON 4 MG PO TBDP
4.0000 mg | ORAL_TABLET | Freq: Once | ORAL | Status: AC
  Administered 2015-12-12: 4 mg via ORAL
  Filled 2015-12-12: qty 1

## 2015-12-12 MED ORDER — ONDANSETRON 4 MG PO TBDP: 4.0000 mg | ORAL_TABLET | Freq: Three times a day (TID) | ORAL | Status: DC | PRN

## 2015-12-12 NOTE — Discharge Instructions (Signed)

## 2015-12-12 NOTE — MAU Provider Note (Signed)
History     CSN: [id number]  Arrival date and time: 12/12/15 1520   First Provider Initiated Contact with Patient 12/12/15 1627      Chief Complaint  Patient presents with  . Abdominal Pain   HPI   [name] is a 30 y.o. female with a history of chronic HTN, not on meds,  [id number] @ [redacted]w[redacted]d presenting to MAU with abdominal pain; the pain starts at her belly button and moves down. The pain started two weeks ago. The pain worsens when she moves, however the pain is not constant. She has not tried anything for the pain.    Denies leaking of fluid Denies vaginal bleeding  + fetal movements.   OB History    Gravida Para Term Preterm AB TAB SAB Ectopic Multiple Living   4 2 2  1  1   0 2      Past Medical History  Diagnosis Date  . Asthma   . Trichomonas vaginitis   . Herpes   . Hypertension     Past Surgical History  Procedure Laterality Date  . Dilation and curettage of uterus      Family History  Problem Relation Age of Onset  . Diabetes Mother   . Hypertension Mother   . Hyperlipidemia Mother   . Heart disease Mother   . Diabetes Sister   . Diabetes Brother     Social History  Substance Use Topics  . Smoking status: Former Smoker    Types: Cigarettes  . Smokeless tobacco: Never Used  . Alcohol Use: No     Comment: occasional    Allergies: No Known Allergies  Prescriptions prior to admission  Medication Sig Dispense Refill Last Dose  . albuterol (PROVENTIL HFA;VENTOLIN HFA) 108 (90 BASE) MCG/ACT inhaler Inhale 2 puffs into the lungs every 6 (six) hours as needed for wheezing or shortness of breath. (Patient taking differently: Inhale 2 puffs into the lungs every 6 (six) hours as needed for shortness of breath (emergency medication for shortness of breath). ) 1 Inhaler 2 Taking   Results for orders placed or performed during the hospital encounter of 12/12/15 (from the past 48 hour(s))  Urinalysis, Routine w reflex microscopic (not at [facility])      Status: Abnormal   Collection Time: 12/12/15  3:33 PM  Result Value Ref Range   Color, Urine YELLOW YELLOW   APPearance CLEAR CLEAR   Specific Gravity, Urine 1.010 1.005 - 1.030   pH 6.5 5.0 - 8.0   Glucose, UA NEGATIVE NEGATIVE mg/dL   Hgb urine dipstick NEGATIVE NEGATIVE   Bilirubin Urine NEGATIVE NEGATIVE   Ketones, ur NEGATIVE NEGATIVE mg/dL   Protein, ur NEGATIVE NEGATIVE mg/dL   Nitrite NEGATIVE NEGATIVE   Leukocytes, UA SMALL (A) NEGATIVE  Urine microscopic-add on     Status: Abnormal   Collection Time: 12/12/15  3:33 PM  Result Value Ref Range   Squamous Epithelial / LPF 0-5 (A) NONE SEEN   WBC, UA 0-5 0 - 5 WBC/hpf   RBC / HPF NONE SEEN 0 - 5 RBC/hpf   Bacteria, UA NONE SEEN NONE SEEN  Wet prep, genital     Status: Abnormal   Collection Time: 12/12/15  5:10 PM  Result Value Ref Range   Yeast Wet Prep HPF POC NONE SEEN NONE SEEN   Trich, Wet Prep NONE SEEN NONE SEEN   Clue Cells Wet Prep HPF POC NONE SEEN NONE SEEN   WBC, Wet Prep HPF POC FEW (A)  NONE SEEN    Comment: MODERATE BACTERIA SEEN   Sperm NONE SEEN     Review of Systems  Constitutional: Negative for fever.  Gastrointestinal: Positive for nausea and abdominal pain. Negative for vomiting, diarrhea and constipation.  Genitourinary: Negative for dysuria and urgency.   Physical Exam   Blood pressure 127/71, pulse 102, temperature 98.1 F (36.7 C), temperature source Oral, resp. rate 18, height 5\' 8"  (1.727 m), weight 264 lb 3.2 oz (119.84 kg), last menstrual period 04/08/2015, not currently breastfeeding.  Physical Exam  Constitutional: She is oriented to person, place, and time. She appears well-developed and well-nourished. No distress.  HENT:  Head: Normocephalic.  Eyes: Pupils are equal, round, and reactive to light.  Genitourinary:  Dilation: Closed, thick Exam by:: Altamease Oiler, NP Wet prep collected.   Musculoskeletal: Normal range of motion.  Neurological: She is alert and oriented to person,  place, and time.  Skin: Skin is warm. She is not diaphoretic.   Fetal Tracing: Baseline: 135 bpm   Variability: Moderate  Accelerations: 15x15 Decelerations: None Toco: Quiet   MAU Course  Procedures  None  MDM  Zofran 4 mg PO Wet prep Nausea resolved    Assessment and Plan   A:  1. Abdominal pain in pregnancy   2. Nausea and vomiting in pregnancy   3. Pain of round ligament affecting pregnancy, antepartum     P:  Discharge home in stable condition RX Zofran RX Flexeril Pregnancy support belt encouraged Fetal kick counts Preterm labor precautions Return to MAU if symptoms worsen Keep scheduled OB appointment.    Raven Lye, NP 12/12/2015 4:31 PM

## 2015-12-12 NOTE — MAU Note (Signed)
Pt states that has been having lower abdominal pain on the left and right side for about two weeks.  Pt states pain has gotten much worse yesterday and today 8/10.  Denies taking pain medicine for pain.  Denies vaginal bleeding.

## 2015-12-17 ENCOUNTER — Other Ambulatory Visit: Payer: Medicaid Other

## 2015-12-20 ENCOUNTER — Ambulatory Visit (HOSPITAL_COMMUNITY): Admission: RE | Admit: 2015-12-20 | Payer: Medicaid Other | Source: Ambulatory Visit

## 2015-12-20 ENCOUNTER — Other Ambulatory Visit: Payer: Medicaid Other | Admitting: Obstetrics & Gynecology

## 2015-12-20 ENCOUNTER — Other Ambulatory Visit (HOSPITAL_COMMUNITY): Payer: Self-pay | Admitting: Maternal and Fetal Medicine

## 2015-12-20 ENCOUNTER — Telehealth: Payer: Self-pay | Admitting: *Deleted

## 2015-12-20 NOTE — Telephone Encounter (Signed)
Called pt regarding missed appts this week including today. Pt states she is having trouble with transportation because her car is not working and she only has 1 friend who can give her a ride sometimes. Pt missed ultrasound today as well. She asked if these appts were necessary. I explained that they are due to her history of high blood pressure which makes her pregnancy high risk and this is the recommended care from her doctor. I also explained that pt may receive rides to her medical appointments from the [facility] transportation service and provided the contact number. Pt voiced understanding and agreed to appts for next week on 5/23 and 5/25.

## 2015-12-24 ENCOUNTER — Encounter: Payer: Self-pay | Admitting: *Deleted

## 2015-12-24 ENCOUNTER — Ambulatory Visit (INDEPENDENT_AMBULATORY_CARE_PROVIDER_SITE_OTHER): Payer: Medicaid Other | Admitting: *Deleted

## 2015-12-24 VITALS — BP 125/62 | HR 101

## 2015-12-24 DIAGNOSIS — O10919 Unspecified pre-existing hypertension complicating pregnancy, unspecified trimester: Secondary | ICD-10-CM

## 2015-12-24 DIAGNOSIS — O10912 Unspecified pre-existing hypertension complicating pregnancy, second trimester: Secondary | ICD-10-CM | POA: Diagnosis not present

## 2015-12-24 DIAGNOSIS — Z36 Encounter for antenatal screening of mother: Secondary | ICD-10-CM

## 2015-12-24 NOTE — Progress Notes (Signed)
Pt missed last appt due to lack of transportation.

## 2015-12-24 NOTE — Progress Notes (Signed)
SUBJECTIVE: Pt. referred by Diane Day, RN for psychosocial:  Pt. reports the following symptoms/concerns: Pt states that her primary concern today is that her husband is leaving her today, when she returns home, and that she worries about finding childcare for her 29yo, 61mo, and baby due next month, and financial hardship when he leaves, taking their only car with him. She says her husband has been diagnosed with "manic depression", but that he did not attend his appointment at [facility], which contributes to her stress.  Duration of problem: Today Severity: moderate   OBJECTIVE: Orientation & Cognition: Oriented x3. Thought processes normal and appropriate to situation. Mood: teary. Affect: appropriate Appearance: appropriate Risk of harm to self or others: no known risk of harm to self or others Substance use: none known Assessments administered: none  ASSESSMENT: Pt currently experiencing psychosocial stressors. Pt needs to f/u with medical provider. Pt would benefit from brief therapeutic interventions and community resources .  Stage of Change: contemplative  PLAN: 1. F/U with behavioral health clinician in one week 2. Psychiatric Medications: none began  3. Behavioral recommendations:   -Call Medicaid transportation to set up transportation for future visits -Call [facility] at [phone] to find out about childcare options she may qualify  -Consider applying for food stamps at [place], if husband leaves -Consider importance of self-care  Other(s) present in the room: none  Time spent with patient in exam room: 15 minutes 11:50am to 12:05pm

## 2015-12-24 NOTE — Progress Notes (Signed)
NST reviewed and reactive.  

## 2015-12-26 ENCOUNTER — Ambulatory Visit (INDEPENDENT_AMBULATORY_CARE_PROVIDER_SITE_OTHER): Payer: Medicaid Other | Admitting: Family

## 2015-12-26 ENCOUNTER — Other Ambulatory Visit (HOSPITAL_COMMUNITY)
Admission: RE | Admit: 2015-12-26 | Discharge: 2015-12-26 | Disposition: A | Discharge status: Home / Self Care | Payer: Medicaid Other | Source: Ambulatory Visit | Attending: Family | Admitting: Family

## 2015-12-26 VITALS — BP 118/64 | HR 97 | Wt 267.6 lb

## 2015-12-26 DIAGNOSIS — Z113 Encounter for screening for infections with a predominantly sexual mode of transmission: Secondary | ICD-10-CM

## 2015-12-26 DIAGNOSIS — O0993 Supervision of high risk pregnancy, unspecified, third trimester: Secondary | ICD-10-CM

## 2015-12-26 DIAGNOSIS — O98513 Other viral diseases complicating pregnancy, third trimester: Secondary | ICD-10-CM

## 2015-12-26 DIAGNOSIS — O10919 Unspecified pre-existing hypertension complicating pregnancy, unspecified trimester: Secondary | ICD-10-CM

## 2015-12-26 DIAGNOSIS — B009 Herpesviral infection, unspecified: Secondary | ICD-10-CM

## 2015-12-26 DIAGNOSIS — O10912 Unspecified pre-existing hypertension complicating pregnancy, second trimester: Secondary | ICD-10-CM | POA: Diagnosis present

## 2015-12-26 LAB — POCT URINALYSIS DIP (DEVICE)
Bilirubin Urine: NEGATIVE
Glucose, UA: NEGATIVE mg/dL
Hgb urine dipstick: NEGATIVE
Ketones, ur: 15 mg/dL — AB
Leukocytes, UA: NEGATIVE
Nitrite: NEGATIVE
Protein, ur: NEGATIVE mg/dL
Specific Gravity, Urine: 1.015 (ref 1.005–1.030)
Urobilinogen, UA: 1 mg/dL (ref 0.0–1.0)
pH: 6.5 (ref 5.0–8.0)

## 2015-12-26 NOTE — Progress Notes (Signed)
Subjective:  [name] is a 30 y.o. [id number] at [redacted]w[redacted]d being seen today for ongoing prenatal care.  She is currently monitored for the following issues for this high-risk pregnancy and has Herpes simplex type 2 (HSV-2) infection affecting pregnancy, antepartum; Sickle cell trait ([place]); Chronic hypertension; Supervision of high risk pregnancy, antepartum; Abnormal quad screen; Anemia affecting pregnancy, antepartum; Short interval between pregnancies affecting pregnancy, antepartum; Chronic hypertension during pregnancy, antepartum; Morbid (severe) obesity due to excess calories ([place]); and Asthma, mild intermittent on her problem list.  Patient reports no complaints.  Contractions: Irregular. Vag. Bleeding: None.  Movement: Present. Denies leaking of fluid.   The following portions of the patient's history were reviewed and updated as appropriate: allergies, current medications, past family history, past medical history, past social history, past surgical history and problem list. Problem list updated.  Objective:   Filed Vitals:   12/26/15 0906  BP: 118/64  Pulse: 97  Weight: 267 lb 9.6 oz (121.383 kg)    Fetal Status: Fetal Heart Rate (bpm): NST   Movement: Present     General:  Alert, oriented and cooperative. Patient is in no acute distress.  Skin: Skin is warm and dry. No rash noted.   Cardiovascular: Normal heart rate noted  Respiratory: Normal respiratory effort, no problems with respiration noted  Abdomen: Soft, gravid, appropriate for gestational age. Pain/Pressure: Present     Pelvic: Vag. Bleeding: None     Cervical exam performed      closed/thick  Extremities: Normal range of motion.  Edema: None  Mental Status: Normal mood and affect. Normal behavior. Normal judgment and thought content.   Urinalysis:    Protein neg  Glucose neg  Assessment and Plan:  Pregnancy: [id number] at [redacted]w[redacted]d  1. Chronic hypertension during pregnancy, antepartum - Fetal nonstress test >  reactive - Culture, beta strep (group b only) - GC/Chlamydia probe amp ([place])not at [facility]  2. Herpes simplex type 2 (HSV-2) infection affecting pregnancy, antepartum, third trimester - Reminded patient to begin taking daily; states already has meds  3. Supervision of high risk pregnancy, antepartum, third trimester - Growth ultrasound/BPP on Tuesday of next week  Preterm labor symptoms and general obstetric precautions including but not limited to vaginal bleeding, contractions, leaking of fluid and fetal movement were reviewed in detail with the patient. Please refer to After Visit Summary for other counseling recommendations.  Return in about 7 days (around 01/02/2016) for as scheduled.   Venia Carbon Michiel Cowboy, CNM

## 2015-12-27 LAB — CULTURE, BETA STREP (GROUP B ONLY)

## 2015-12-27 LAB — GC/CHLAMYDIA PROBE AMP (~~LOC~~) NOT AT ARMC
Chlamydia: NEGATIVE
Neisseria Gonorrhea: NEGATIVE

## 2015-12-31 ENCOUNTER — Other Ambulatory Visit (HOSPITAL_COMMUNITY): Payer: Self-pay | Admitting: Maternal and Fetal Medicine

## 2015-12-31 ENCOUNTER — Ambulatory Visit (HOSPITAL_COMMUNITY)
Admission: RE | Admit: 2015-12-31 | Discharge: 2015-12-31 | Disposition: A | Discharge status: Home / Self Care | Payer: Medicaid Other | Source: Ambulatory Visit | Attending: Advanced Practice Midwife | Admitting: Advanced Practice Midwife

## 2015-12-31 ENCOUNTER — Encounter (HOSPITAL_COMMUNITY): Payer: Self-pay

## 2015-12-31 ENCOUNTER — Other Ambulatory Visit: Payer: Self-pay | Admitting: Family Medicine

## 2015-12-31 VITALS — BP 118/66 | HR 92 | Wt 269.0 lb

## 2015-12-31 DIAGNOSIS — O10919 Unspecified pre-existing hypertension complicating pregnancy, unspecified trimester: Secondary | ICD-10-CM

## 2015-12-31 DIAGNOSIS — Z3A36 36 weeks gestation of pregnancy: Secondary | ICD-10-CM

## 2015-12-31 DIAGNOSIS — O163 Unspecified maternal hypertension, third trimester: Secondary | ICD-10-CM

## 2015-12-31 DIAGNOSIS — O09893 Supervision of other high risk pregnancies, third trimester: Secondary | ICD-10-CM

## 2015-12-31 DIAGNOSIS — D573 Sickle-cell trait: Secondary | ICD-10-CM

## 2015-12-31 DIAGNOSIS — O09899 Supervision of other high risk pregnancies, unspecified trimester: Secondary | ICD-10-CM

## 2015-12-31 DIAGNOSIS — O99019 Anemia complicating pregnancy, unspecified trimester: Secondary | ICD-10-CM

## 2015-12-31 DIAGNOSIS — O0993 Supervision of high risk pregnancy, unspecified, third trimester: Secondary | ICD-10-CM

## 2015-12-31 DIAGNOSIS — O280 Abnormal hematological finding on antenatal screening of mother: Secondary | ICD-10-CM

## 2015-12-31 DIAGNOSIS — O10913 Unspecified pre-existing hypertension complicating pregnancy, third trimester: Secondary | ICD-10-CM | POA: Diagnosis present

## 2016-01-02 ENCOUNTER — Ambulatory Visit (INDEPENDENT_AMBULATORY_CARE_PROVIDER_SITE_OTHER): Payer: Medicaid Other | Admitting: Obstetrics & Gynecology

## 2016-01-02 ENCOUNTER — Encounter: Payer: Self-pay | Admitting: Obstetrics & Gynecology

## 2016-01-02 VITALS — BP 132/69 | HR 96 | Wt 267.1 lb

## 2016-01-02 DIAGNOSIS — O99013 Anemia complicating pregnancy, third trimester: Secondary | ICD-10-CM | POA: Diagnosis not present

## 2016-01-02 DIAGNOSIS — D573 Sickle-cell trait: Secondary | ICD-10-CM

## 2016-01-02 DIAGNOSIS — O10913 Unspecified pre-existing hypertension complicating pregnancy, third trimester: Secondary | ICD-10-CM | POA: Diagnosis not present

## 2016-01-02 DIAGNOSIS — O10912 Unspecified pre-existing hypertension complicating pregnancy, second trimester: Secondary | ICD-10-CM

## 2016-01-02 DIAGNOSIS — O10919 Unspecified pre-existing hypertension complicating pregnancy, unspecified trimester: Secondary | ICD-10-CM

## 2016-01-02 LAB — POCT URINALYSIS DIP (DEVICE)
Bilirubin Urine: NEGATIVE
Glucose, UA: NEGATIVE mg/dL
Hgb urine dipstick: NEGATIVE
Ketones, ur: NEGATIVE mg/dL
Nitrite: NEGATIVE
Protein, ur: 30 mg/dL — AB
Specific Gravity, Urine: 1.02 (ref 1.005–1.030)
Urobilinogen, UA: 0.2 mg/dL (ref 0.0–1.0)
pH: 7 (ref 5.0–8.0)

## 2016-01-02 NOTE — Progress Notes (Signed)
Subjective:pressure  [name] is a 30 y.o. [id number] at [redacted]w[redacted]d being seen today for ongoing prenatal care.  She is currently monitored for the following issues for this high-risk pregnancy and has Herpes simplex type 2 (HSV-2) infection affecting pregnancy, antepartum; Sickle cell trait ([place]); Chronic hypertension; Supervision of high risk pregnancy, antepartum; Abnormal quad screen; Anemia affecting pregnancy, antepartum; Short interval between pregnancies affecting pregnancy, antepartum; Chronic hypertension during pregnancy, antepartum; Morbid (severe) obesity due to excess calories ([place]); and Asthma, mild intermittent on her problem list.  Patient reports occasional contractions.  Contractions: Irregular. Vag. Bleeding: None.  Movement: Present. Denies leaking of fluid.   The following portions of the patient's history were reviewed and updated as appropriate: allergies, current medications, past family history, past medical history, past social history, past surgical history and problem list. Problem list updated.  Objective:   Filed Vitals:   01/02/16 0829  BP: 132/69  Pulse: 96  Weight: 267 lb 1.6 oz (121.156 kg)    Fetal Status: Fetal Heart Rate (bpm): NST   Movement: Present     General:  Alert, oriented and cooperative. Patient is in no acute distress.  Skin: Skin is warm and dry. No rash noted.   Cardiovascular: Normal heart rate noted  Respiratory: Normal respiratory effort, no problems with respiration noted  Abdomen: Soft, gravid, appropriate for gestational age. Pain/Pressure: Present     Pelvic: Vag. Bleeding: None     Cervical exam deferred        Extremities: Normal range of motion.  Edema: None  Mental Status: Normal mood and affect. Normal behavior. Normal judgment and thought content.   Urinalysis:      Assessment and Plan:  Pregnancy: [id number] at [redacted]w[redacted]d  1. Chronic hypertension during pregnancy, antepartum NST reactive today, growth nl on [place] 2 days  ago - Fetal nonstress test  Term labor symptoms and general obstetric precautions including but not limited to vaginal bleeding, contractions, leaking of fluid and fetal movement were reviewed in detail with the patient. Please refer to After Visit Summary for other counseling recommendations.  Return in about 7 days (around 01/09/2016) for Ob fu and NST @ 0900 (overbook).   Woodroe Mode, MD

## 2016-01-02 NOTE — Progress Notes (Signed)
[place] for growth & BPP done 5/30

## 2016-01-02 NOTE — Patient Instructions (Signed)
Vaginal Delivery °During delivery, your health care provider will help you give birth to your baby. During a vaginal delivery, you will work to push the baby out of your vagina. However, before you can push your baby out, a few things need to happen. The opening of your uterus (cervix) has to soften, thin out, and open up (dilate) all the way to 10 cm. Also, your baby has to move down from the uterus into your vagina.  °SIGNS OF LABOR  °Your health care provider will first need to make sure you are in labor. Signs of labor include:  °· Passing what is called the mucous plug before labor begins. This is a small amount of blood-stained mucus. °· Having regular, painful uterine contractions.   °· The time between contractions gets shorter.   °· The discomfort and pain gradually get more intense. °· Contraction pains get worse when walking and do not go away when resting.   °· Your cervix becomes thinner (effacement) and dilates. °BEFORE THE DELIVERY °Once you are in labor and admitted into the hospital or care center, your health care provider may do the following:  °· Perform a complete physical exam. °· Review any complications related to pregnancy or labor.  °· Check your blood pressure, pulse, temperature, and heart rate (vital signs).   °· Determine if, and when, the rupture of amniotic membranes occurred. °· Do a vaginal exam (using a sterile glove and lubricant) to determine:   °¨ The position (presentation) of the baby. Is the baby's head presenting first (vertex) in the birth canal (vagina), or are the feet or buttocks first (breech)?   °¨ The level (station) of the baby's head within the birth canal.   °¨ The effacement and dilatation of the cervix.   °· An electronic fetal monitor is usually placed on your abdomen when you first arrive. This is used to monitor your contractions and the baby's heart rate. °¨ When the monitor is on your abdomen (external fetal monitor), it can only pick up the frequency and  length of your contractions. It cannot tell the strength of your contractions. °¨ If it becomes necessary for your health care provider to know exactly how strong your contractions are or to see exactly what the baby's heart rate is doing, an internal monitor may be inserted into your vagina and uterus. Your health care provider will discuss the benefits and risks of using an internal monitor and obtain your permission before inserting the device. °¨ Continuous fetal monitoring may be needed if you have an epidural, are receiving certain medicines (such as oxytocin), or have pregnancy or labor complications. °· An IV access tube may be placed into a vein in your arm to deliver fluids and medicines if necessary. °THREE STAGES OF LABOR AND DELIVERY °Normal labor and delivery is divided into three stages. °First Stage °This stage starts when you begin to contract regularly and your cervix begins to efface and dilate. It ends when your cervix is completely open (fully dilated). The first stage is the longest stage of labor and can last from 3 hours to 15 hours.  °Several methods are available to help with labor pain. You and your health care provider will decide which option is best for you. Options include:  °· Opioid medicines. These are strong pain medicines that you can get through your IV tube or as a shot into your muscle. These medicines lessen pain but do not make it go away completely.  °· Epidural. A medicine is given through a thin tube that   is inserted in your back. The medicine numbs the lower part of your body and prevents any pain in that area. °· Paracervical pain medicine. This is an injection of an anesthetic on each side of your cervix.   °· You may request natural childbirth, which does not involve the use of pain medicines or an epidural during labor and delivery. Instead, you will use other things, such as breathing exercises, to help cope with the pain. °Second Stage °The second stage of labor  begins when your cervix is fully dilated at 10 cm. It continues until you push your baby down through the birth canal and the baby is born. This stage can take only minutes or several hours. °· The location of your baby's head as it moves through the birth canal is reported as a number called a station. If the baby's head has not started its descent, the station is described as being at minus 3 (-3). When your baby's head is at the zero station, it is at the middle of the birth canal and is engaged in the pelvis. The station of your baby helps indicate the progress of the second stage of labor. °· When your baby is born, your health care provider may hold the baby with his or her head lowered to prevent amniotic fluid, mucus, and blood from getting into the baby's lungs. The baby's mouth and nose may be suctioned with a small bulb syringe to remove any additional fluid. °· Your health care provider may then place the baby on your stomach. It is important to keep the baby from getting cold. To do this, the health care provider will dry the baby off, place the baby directly on your skin (with no blankets between you and the baby), and cover the baby with warm, dry blankets.   °· The umbilical cord is cut. °Third Stage °During the third stage of labor, your health care provider will deliver the placenta (afterbirth) and make sure your bleeding is under control. The delivery of the placenta usually takes about 5 minutes but can take up to 30 minutes. After the placenta is delivered, a medicine may be given either by IV or injection to help contract the uterus and control bleeding. If you are planning to breastfeed, you can try to do so now. °After you deliver the placenta, your uterus should contract and get very firm. If your uterus does not remain firm, your health care provider will massage it. This is important because the contraction of the uterus helps cut off bleeding at the site where the placenta was attached  to your uterus. If your uterus does not contract properly and stay firm, you may continue to bleed heavily. If there is a lot of bleeding, medicines may be given to contract the uterus and stop the bleeding.  °  °This information is not intended to replace advice given to you by your health care provider. Make sure you discuss any questions you have with your health care provider. °  °Document Released: 04/28/2008 Document Revised: 08/10/2014 Document Reviewed: 03/16/2012 °Elsevier Interactive Patient Education ©2016 [employer] ° °

## 2016-01-07 ENCOUNTER — Other Ambulatory Visit: Payer: Medicaid Other

## 2016-01-08 ENCOUNTER — Inpatient Hospital Stay (HOSPITAL_COMMUNITY): Payer: Medicaid Other | Admitting: Anesthesiology

## 2016-01-08 ENCOUNTER — Encounter (HOSPITAL_COMMUNITY)
Admission: AD | Disposition: A | Discharge status: Home / Self Care | Payer: Self-pay | Source: Ambulatory Visit | Attending: Obstetrics and Gynecology

## 2016-01-08 ENCOUNTER — Inpatient Hospital Stay (HOSPITAL_COMMUNITY)
Admission: AD | Admit: 2016-01-08 | Discharge: 2016-01-11 | DRG: 765 | Disposition: A | Discharge status: Home / Self Care | Payer: Medicaid Other | Source: Ambulatory Visit | Attending: Obstetrics and Gynecology | Admitting: Obstetrics and Gynecology

## 2016-01-08 ENCOUNTER — Encounter (HOSPITAL_COMMUNITY): Payer: Self-pay | Admitting: *Deleted

## 2016-01-08 ENCOUNTER — Inpatient Hospital Stay (HOSPITAL_COMMUNITY): Payer: Medicaid Other

## 2016-01-08 DIAGNOSIS — A6000 Herpesviral infection of urogenital system, unspecified: Secondary | ICD-10-CM | POA: Diagnosis present

## 2016-01-08 DIAGNOSIS — Z8249 Family history of ischemic heart disease and other diseases of the circulatory system: Secondary | ICD-10-CM

## 2016-01-08 DIAGNOSIS — Z09 Encounter for follow-up examination after completed treatment for conditions other than malignant neoplasm: Secondary | ICD-10-CM

## 2016-01-08 DIAGNOSIS — O9902 Anemia complicating childbirth: Secondary | ICD-10-CM | POA: Diagnosis present

## 2016-01-08 DIAGNOSIS — D649 Anemia, unspecified: Secondary | ICD-10-CM | POA: Diagnosis present

## 2016-01-08 DIAGNOSIS — B009 Herpesviral infection, unspecified: Secondary | ICD-10-CM | POA: Diagnosis present

## 2016-01-08 DIAGNOSIS — D573 Sickle-cell trait: Secondary | ICD-10-CM | POA: Diagnosis present

## 2016-01-08 DIAGNOSIS — Z6841 Body Mass Index (BMI) 40.0 and over, adult: Secondary | ICD-10-CM | POA: Diagnosis not present

## 2016-01-08 DIAGNOSIS — O9832 Other infections with a predominantly sexual mode of transmission complicating childbirth: Secondary | ICD-10-CM | POA: Diagnosis present

## 2016-01-08 DIAGNOSIS — Z87891 Personal history of nicotine dependence: Secondary | ICD-10-CM | POA: Diagnosis not present

## 2016-01-08 DIAGNOSIS — O34219 Maternal care for unspecified type scar from previous cesarean delivery: Secondary | ICD-10-CM

## 2016-01-08 DIAGNOSIS — Z833 Family history of diabetes mellitus: Secondary | ICD-10-CM | POA: Diagnosis not present

## 2016-01-08 DIAGNOSIS — O99214 Obesity complicating childbirth: Secondary | ICD-10-CM | POA: Diagnosis present

## 2016-01-08 DIAGNOSIS — O09899 Supervision of other high risk pregnancies, unspecified trimester: Secondary | ICD-10-CM

## 2016-01-08 DIAGNOSIS — O99824 Streptococcus B carrier state complicating childbirth: Secondary | ICD-10-CM | POA: Diagnosis not present

## 2016-01-08 DIAGNOSIS — O99019 Anemia complicating pregnancy, unspecified trimester: Secondary | ICD-10-CM

## 2016-01-08 DIAGNOSIS — Z3A38 38 weeks gestation of pregnancy: Secondary | ICD-10-CM

## 2016-01-08 DIAGNOSIS — O9952 Diseases of the respiratory system complicating childbirth: Secondary | ICD-10-CM | POA: Diagnosis present

## 2016-01-08 DIAGNOSIS — O280 Abnormal hematological finding on antenatal screening of mother: Secondary | ICD-10-CM

## 2016-01-08 DIAGNOSIS — Z98891 History of uterine scar from previous surgery: Secondary | ICD-10-CM

## 2016-01-08 DIAGNOSIS — O98519 Other viral diseases complicating pregnancy, unspecified trimester: Secondary | ICD-10-CM

## 2016-01-08 DIAGNOSIS — O10919 Unspecified pre-existing hypertension complicating pregnancy, unspecified trimester: Secondary | ICD-10-CM

## 2016-01-08 DIAGNOSIS — J45909 Unspecified asthma, uncomplicated: Secondary | ICD-10-CM | POA: Diagnosis present

## 2016-01-08 DIAGNOSIS — O0993 Supervision of high risk pregnancy, unspecified, third trimester: Secondary | ICD-10-CM

## 2016-01-08 HISTORY — DX: Benign neoplasm of connective and other soft tissue, unspecified: D21.9

## 2016-01-08 LAB — CBC WITH DIFFERENTIAL/PLATELET
Band Neutrophils: 0 %
Basophils Absolute: 0 K/uL (ref 0.0–0.1)
Basophils Relative: 0 %
Blasts: 0 %
Eosinophils Absolute: 0 K/uL (ref 0.0–0.7)
Eosinophils Relative: 0 %
HCT: 25.5 % — ABNORMAL LOW (ref 36.0–46.0)
Hemoglobin: 8.2 g/dL — ABNORMAL LOW (ref 12.0–15.0)
Lymphocytes Relative: 14 %
Lymphs Abs: 1.3 K/uL (ref 0.7–4.0)
MCH: 20.9 pg — ABNORMAL LOW (ref 26.0–34.0)
MCHC: 32.2 g/dL (ref 30.0–36.0)
MCV: 65.1 fL — ABNORMAL LOW (ref 78.0–100.0)
Metamyelocytes Relative: 0 %
Monocytes Absolute: 0.3 K/uL (ref 0.1–1.0)
Monocytes Relative: 3 %
Myelocytes: 0 %
Neutro Abs: 7.7 K/uL (ref 1.7–7.7)
Neutrophils Relative %: 83 %
Other: 0 %
Platelets: 220 K/uL (ref 150–400)
Promyelocytes Absolute: 0 %
RBC: 3.92 MIL/uL (ref 3.87–5.11)
RDW: 17.9 % — ABNORMAL HIGH (ref 11.5–15.5)
WBC: 9.3 K/uL (ref 4.0–10.5)
nRBC: 0 /100{WBCs}

## 2016-01-08 LAB — CBC
HCT: 28.6 % — ABNORMAL LOW (ref 36.0–46.0)
Hemoglobin: 8.8 g/dL — ABNORMAL LOW (ref 12.0–15.0)
MCH: 20 pg — ABNORMAL LOW (ref 26.0–34.0)
MCHC: 30.8 g/dL (ref 30.0–36.0)
MCV: 64.9 fL — ABNORMAL LOW (ref 78.0–100.0)
Platelets: 252 10*3/uL (ref 150–400)
RBC: 4.41 MIL/uL (ref 3.87–5.11)
RDW: 17.3 % — ABNORMAL HIGH (ref 11.5–15.5)
WBC: 12 10*3/uL — ABNORMAL HIGH (ref 4.0–10.5)

## 2016-01-08 LAB — TYPE AND SCREEN
ABO/RH(D): A POS
Antibody Screen: NEGATIVE

## 2016-01-08 SURGERY — Surgical Case
Anesthesia: Epidural | Wound class: Clean Contaminated

## 2016-01-08 MED ORDER — ONDANSETRON HCL 4 MG/2ML IJ SOLN
4.0000 mg | Freq: Three times a day (TID) | INTRAMUSCULAR | Status: DC | PRN
  Filled 2016-01-08: qty 2

## 2016-01-08 MED ORDER — SODIUM CHLORIDE 0.9% FLUSH
3.0000 mL | INTRAVENOUS | Status: DC | PRN
Start: 2016-01-08 — End: 2016-01-08

## 2016-01-08 MED ORDER — SCOPOLAMINE 1 MG/3DAYS TD PT72: 1.0000 | MEDICATED_PATCH | Freq: Once | TRANSDERMAL | Status: DC

## 2016-01-08 MED ORDER — DIPHENHYDRAMINE HCL 25 MG PO CAPS: 25.0000 mg | ORAL_CAPSULE | Freq: Four times a day (QID) | ORAL | Status: DC | PRN

## 2016-01-08 MED ORDER — SOD CITRATE-CITRIC ACID 500-334 MG/5ML PO SOLN
30.0000 mL | ORAL | Status: DC | PRN
  Filled 2016-01-08: qty 15

## 2016-01-08 MED ORDER — ONDANSETRON HCL 4 MG/2ML IJ SOLN
INTRAMUSCULAR | Status: DC | PRN
  Administered 2016-01-08: 4 mg via INTRAVENOUS

## 2016-01-08 MED ORDER — OXYTOCIN 10 UNIT/ML IJ SOLN
INTRAMUSCULAR | Status: AC
  Filled 2016-01-08: qty 4

## 2016-01-08 MED ORDER — TETANUS-DIPHTH-ACELL PERTUSSIS 5-2.5-18.5 LF-MCG/0.5 IM SUSP
0.5000 mL | Freq: Once | INTRAMUSCULAR | Status: AC
  Administered 2016-01-09: 0.5 mL via INTRAMUSCULAR
  Filled 2016-01-08: qty 0.5

## 2016-01-08 MED ORDER — PHENYLEPHRINE 40 MCG/ML (10ML) SYRINGE FOR IV PUSH (FOR BLOOD PRESSURE SUPPORT): 80.0000 ug | PREFILLED_SYRINGE | INTRAVENOUS | Status: DC | PRN

## 2016-01-08 MED ORDER — DIPHENHYDRAMINE HCL 50 MG/ML IJ SOLN: 12.5000 mg | INTRAMUSCULAR | Status: DC | PRN

## 2016-01-08 MED ORDER — NALOXONE HCL 0.4 MG/ML IJ SOLN: 0.4000 mg | INTRAMUSCULAR | Status: DC | PRN

## 2016-01-08 MED ORDER — ACETAMINOPHEN 325 MG PO TABS: 650.0000 mg | ORAL_TABLET | ORAL | Status: DC | PRN

## 2016-01-08 MED ORDER — DEXAMETHASONE SODIUM PHOSPHATE 10 MG/ML IJ SOLN
INTRAMUSCULAR | Status: DC | PRN
  Administered 2016-01-08: 10 mg via INTRAVENOUS

## 2016-01-08 MED ORDER — NALBUPHINE HCL 10 MG/ML IJ SOLN: 5.0000 mg | INTRAMUSCULAR | Status: DC | PRN

## 2016-01-08 MED ORDER — OXYTOCIN BOLUS FROM INFUSION
500.0000 mL | INTRAVENOUS | Status: DC
Start: 2016-01-08 — End: 2016-01-08

## 2016-01-08 MED ORDER — KETOROLAC TROMETHAMINE 30 MG/ML IJ SOLN: 30.0000 mg | Freq: Four times a day (QID) | INTRAMUSCULAR | Status: DC | PRN

## 2016-01-08 MED ORDER — CEFAZOLIN SODIUM-DEXTROSE 2-3 GM-% IV SOLR
INTRAVENOUS | Status: DC | PRN
  Administered 2016-01-08: 2 g via INTRAVENOUS

## 2016-01-08 MED ORDER — SENNOSIDES-DOCUSATE SODIUM 8.6-50 MG PO TABS
2.0000 | ORAL_TABLET | ORAL | Status: DC
  Administered 2016-01-09 – 2016-01-10 (×2): 2 via ORAL
  Filled 2016-01-08 (×3): qty 2

## 2016-01-08 MED ORDER — MIDAZOLAM HCL 2 MG/2ML IJ SOLN
INTRAMUSCULAR | Status: AC
  Filled 2016-01-08: qty 2

## 2016-01-08 MED ORDER — IBUPROFEN 600 MG PO TABS: 600.0000 mg | ORAL_TABLET | Freq: Four times a day (QID) | ORAL | Status: DC | PRN

## 2016-01-08 MED ORDER — ONDANSETRON HCL 4 MG/2ML IJ SOLN
INTRAMUSCULAR | Status: AC
  Filled 2016-01-08: qty 2

## 2016-01-08 MED ORDER — SIMETHICONE 80 MG PO CHEW
80.0000 mg | CHEWABLE_TABLET | ORAL | Status: DC
  Administered 2016-01-09 – 2016-01-10 (×2): 80 mg via ORAL
  Filled 2016-01-08 (×3): qty 1

## 2016-01-08 MED ORDER — OXYCODONE-ACETAMINOPHEN 5-325 MG PO TABS
1.0000 | ORAL_TABLET | ORAL | Status: DC | PRN
  Administered 2016-01-09 – 2016-01-11 (×5): 1 via ORAL
  Filled 2016-01-08 (×6): qty 1

## 2016-01-08 MED ORDER — KETOROLAC TROMETHAMINE 30 MG/ML IJ SOLN: 30.0000 mg | Freq: Four times a day (QID) | INTRAMUSCULAR | Status: AC | PRN

## 2016-01-08 MED ORDER — OXYCODONE-ACETAMINOPHEN 5-325 MG PO TABS: 2.0000 | ORAL_TABLET | ORAL | Status: DC | PRN

## 2016-01-08 MED ORDER — NALBUPHINE HCL 10 MG/ML IJ SOLN: 5.0000 mg | Freq: Once | INTRAMUSCULAR | Status: DC | PRN

## 2016-01-08 MED ORDER — LACTATED RINGERS IV SOLN
INTRAVENOUS | Status: DC
Start: 2016-01-08 — End: 2016-01-08
  Administered 2016-01-08 (×2): via INTRAVENOUS

## 2016-01-08 MED ORDER — FLEET ENEMA 7-19 GM/118ML RE ENEM: 1.0000 | ENEMA | RECTAL | Status: DC | PRN

## 2016-01-08 MED ORDER — OXYTOCIN 40 UNITS IN LACTATED RINGERS INFUSION - SIMPLE MED
2.5000 [IU]/h | INTRAVENOUS | Status: AC
  Administered 2016-01-09: 2.5 [IU]/h via INTRAVENOUS
  Filled 2016-01-08: qty 1000

## 2016-01-08 MED ORDER — PHENYLEPHRINE 40 MCG/ML (10ML) SYRINGE FOR IV PUSH (FOR BLOOD PRESSURE SUPPORT)
PREFILLED_SYRINGE | INTRAVENOUS | Status: AC
  Filled 2016-01-08: qty 10

## 2016-01-08 MED ORDER — SODIUM CHLORIDE 0.9% FLUSH: 3.0000 mL | INTRAVENOUS | Status: DC | PRN

## 2016-01-08 MED ORDER — ONDANSETRON HCL 4 MG/2ML IJ SOLN: 4.0000 mg | Freq: Once | INTRAMUSCULAR | Status: DC | PRN

## 2016-01-08 MED ORDER — ONDANSETRON HCL 4 MG/2ML IJ SOLN: 4.0000 mg | Freq: Four times a day (QID) | INTRAMUSCULAR | Status: DC | PRN

## 2016-01-08 MED ORDER — PNEUMOCOCCAL VAC POLYVALENT 25 MCG/0.5ML IJ INJ
0.5000 mL | INJECTION | INTRAMUSCULAR | Status: AC
  Administered 2016-01-09: 0.5 mL via INTRAMUSCULAR
  Filled 2016-01-08 (×2): qty 0.5

## 2016-01-08 MED ORDER — MEPERIDINE HCL 25 MG/ML IJ SOLN: 6.2500 mg | INTRAMUSCULAR | Status: DC | PRN

## 2016-01-08 MED ORDER — KETOROLAC TROMETHAMINE 30 MG/ML IJ SOLN
INTRAMUSCULAR | Status: AC
  Filled 2016-01-08: qty 1

## 2016-01-08 MED ORDER — DEXTROSE 5 % IV SOLN
2.5000 10*6.[IU] | INTRAVENOUS | Status: DC
  Filled 2016-01-08: qty 2.5

## 2016-01-08 MED ORDER — NALOXONE HCL 2 MG/2ML IJ SOSY
1.0000 ug/kg/h | PREFILLED_SYRINGE | INTRAVENOUS | Status: DC | PRN
  Filled 2016-01-08: qty 2

## 2016-01-08 MED ORDER — ZOLPIDEM TARTRATE 5 MG PO TABS: 5.0000 mg | ORAL_TABLET | Freq: Every evening | ORAL | Status: DC | PRN

## 2016-01-08 MED ORDER — PRENATAL MULTIVITAMIN CH
1.0000 | ORAL_TABLET | Freq: Every day | ORAL | Status: DC
  Administered 2016-01-09 – 2016-01-10 (×2): 1 via ORAL
  Filled 2016-01-08 (×3): qty 1

## 2016-01-08 MED ORDER — SCOPOLAMINE 1 MG/3DAYS TD PT72
MEDICATED_PATCH | TRANSDERMAL | Status: DC | PRN
  Administered 2016-01-08: 1 via TRANSDERMAL

## 2016-01-08 MED ORDER — PHENYLEPHRINE HCL 10 MG/ML IJ SOLN
INTRAMUSCULAR | Status: DC | PRN
  Administered 2016-01-08 (×2): 40 ug via INTRAVENOUS

## 2016-01-08 MED ORDER — SIMETHICONE 80 MG PO CHEW
80.0000 mg | CHEWABLE_TABLET | Freq: Three times a day (TID) | ORAL | Status: DC
  Administered 2016-01-09 – 2016-01-11 (×7): 80 mg via ORAL
  Filled 2016-01-08 (×7): qty 1

## 2016-01-08 MED ORDER — KETOROLAC TROMETHAMINE 30 MG/ML IJ SOLN: 30.0000 mg | Freq: Once | INTRAMUSCULAR | Status: DC

## 2016-01-08 MED ORDER — ONDANSETRON HCL 4 MG/2ML IJ SOLN: 4.0000 mg | Freq: Three times a day (TID) | INTRAMUSCULAR | Status: DC | PRN

## 2016-01-08 MED ORDER — LIDOCAINE HCL (PF) 1 % IJ SOLN
INTRAMUSCULAR | Status: DC | PRN
  Administered 2016-01-08 (×2): 7 mL via EPIDURAL

## 2016-01-08 MED ORDER — DIPHENHYDRAMINE HCL 25 MG PO CAPS
25.0000 mg | ORAL_CAPSULE | ORAL | Status: DC | PRN
  Filled 2016-01-08: qty 1

## 2016-01-08 MED ORDER — ACETAMINOPHEN 500 MG PO TABS
1000.0000 mg | ORAL_TABLET | Freq: Four times a day (QID) | ORAL | Status: AC
  Administered 2016-01-09 (×2): 1000 mg via ORAL
  Filled 2016-01-08 (×3): qty 2

## 2016-01-08 MED ORDER — LACTATED RINGERS IV SOLN
500.0000 mL | Freq: Once | INTRAVENOUS | Status: AC
  Administered 2016-01-08: 500 mL via INTRAVENOUS

## 2016-01-08 MED ORDER — SIMETHICONE 80 MG PO CHEW: 80.0000 mg | CHEWABLE_TABLET | ORAL | Status: DC | PRN

## 2016-01-08 MED ORDER — MIDAZOLAM HCL 2 MG/2ML IJ SOLN
INTRAMUSCULAR | Status: DC | PRN
  Administered 2016-01-08: 2 mg via INTRAVENOUS

## 2016-01-08 MED ORDER — OXYTOCIN 40 UNITS IN LACTATED RINGERS INFUSION - SIMPLE MED
2.5000 [IU]/h | INTRAVENOUS | Status: DC
Start: 2016-01-08 — End: 2016-01-08

## 2016-01-08 MED ORDER — EPHEDRINE 5 MG/ML INJ: 10.0000 mg | INTRAVENOUS | Status: DC | PRN

## 2016-01-08 MED ORDER — COCONUT OIL OIL: 1.0000 "application " | TOPICAL_OIL | Status: DC | PRN

## 2016-01-08 MED ORDER — FENTANYL 2.5 MCG/ML BUPIVACAINE 1/10 % EPIDURAL INFUSION (WH - ANES)
INTRAMUSCULAR | Status: AC
  Filled 2016-01-08: qty 125

## 2016-01-08 MED ORDER — SCOPOLAMINE 1 MG/3DAYS TD PT72
MEDICATED_PATCH | TRANSDERMAL | Status: AC
  Filled 2016-01-08: qty 1

## 2016-01-08 MED ORDER — SODIUM BICARBONATE 8.4 % IV SOLN
INTRAVENOUS | Status: AC
  Filled 2016-01-08: qty 50

## 2016-01-08 MED ORDER — SODIUM CHLORIDE 0.9 % IR SOLN
Status: DC | PRN
  Administered 2016-01-08: 1

## 2016-01-08 MED ORDER — LACTATED RINGERS IV SOLN
INTRAVENOUS | Status: DC | PRN
  Administered 2016-01-08: 14:00:00 via INTRAVENOUS

## 2016-01-08 MED ORDER — CEFAZOLIN SODIUM-DEXTROSE 2-4 GM/100ML-% IV SOLN
INTRAVENOUS | Status: AC
Start: 2016-01-08 — End: 2016-01-08
  Filled 2016-01-08: qty 100

## 2016-01-08 MED ORDER — IBUPROFEN 600 MG PO TABS
600.0000 mg | ORAL_TABLET | Freq: Four times a day (QID) | ORAL | Status: DC
  Administered 2016-01-09 – 2016-01-11 (×9): 600 mg via ORAL
  Filled 2016-01-08 (×10): qty 1

## 2016-01-08 MED ORDER — OXYCODONE-ACETAMINOPHEN 5-325 MG PO TABS
2.0000 | ORAL_TABLET | ORAL | Status: DC | PRN
  Filled 2016-01-08: qty 2

## 2016-01-08 MED ORDER — LIDOCAINE-EPINEPHRINE (PF) 2 %-1:200000 IJ SOLN
INTRAMUSCULAR | Status: AC
  Filled 2016-01-08: qty 20

## 2016-01-08 MED ORDER — HYDROMORPHONE HCL 1 MG/ML IJ SOLN: 0.2500 mg | INTRAMUSCULAR | Status: DC | PRN

## 2016-01-08 MED ORDER — METHYLERGONOVINE MALEATE 0.2 MG/ML IJ SOLN
INTRAMUSCULAR | Status: DC | PRN
  Administered 2016-01-08: 0.2 mg via INTRAMUSCULAR

## 2016-01-08 MED ORDER — OXYCODONE-ACETAMINOPHEN 5-325 MG PO TABS: 1.0000 | ORAL_TABLET | ORAL | Status: DC | PRN

## 2016-01-08 MED ORDER — DEXAMETHASONE SODIUM PHOSPHATE 10 MG/ML IJ SOLN
INTRAMUSCULAR | Status: AC
Start: 2016-01-08 — End: 2016-01-08
  Filled 2016-01-08: qty 1

## 2016-01-08 MED ORDER — ACETAMINOPHEN 500 MG PO TABS: 1000.0000 mg | ORAL_TABLET | Freq: Four times a day (QID) | ORAL | Status: DC

## 2016-01-08 MED ORDER — FENTANYL 2.5 MCG/ML BUPIVACAINE 1/10 % EPIDURAL INFUSION (WH - ANES)
14.0000 mL/h | INTRAMUSCULAR | Status: DC | PRN
  Administered 2016-01-08: 14 mL/h via EPIDURAL

## 2016-01-08 MED ORDER — LACTATED RINGERS IV SOLN
INTRAVENOUS | Status: DC
  Administered 2016-01-08 – 2016-01-09 (×2): via INTRAVENOUS

## 2016-01-08 MED ORDER — MENTHOL 3 MG MT LOZG
1.0000 | LOZENGE | OROMUCOSAL | Status: DC | PRN
Start: 2016-01-08 — End: 2016-01-11

## 2016-01-08 MED ORDER — PENICILLIN G POTASSIUM 5000000 UNITS IJ SOLR
5.0000 10*6.[IU] | Freq: Once | INTRAVENOUS | Status: DC
  Filled 2016-01-08: qty 5

## 2016-01-08 MED ORDER — SODIUM BICARBONATE 8.4 % IV SOLN
INTRAVENOUS | Status: DC | PRN
  Administered 2016-01-08 (×2): 10 mL via EPIDURAL

## 2016-01-08 MED ORDER — OXYTOCIN 10 UNIT/ML IJ SOLN
40.0000 [IU] | INTRAVENOUS | Status: DC | PRN
  Administered 2016-01-08: 40 [IU] via INTRAVENOUS

## 2016-01-08 MED ORDER — DIBUCAINE 1 % RE OINT: 1.0000 "application " | TOPICAL_OINTMENT | RECTAL | Status: DC | PRN

## 2016-01-08 MED ORDER — SODIUM CHLORIDE 0.9 % IV SOLN
2.0000 g | Freq: Once | INTRAVENOUS | Status: AC
  Administered 2016-01-08: 2 g via INTRAVENOUS
  Filled 2016-01-08: qty 2000

## 2016-01-08 MED ORDER — MORPHINE SULFATE (PF) 0.5 MG/ML IJ SOLN
INTRAMUSCULAR | Status: AC
  Filled 2016-01-08: qty 10

## 2016-01-08 MED ORDER — LACTATED RINGERS IV SOLN: 500.0000 mL | INTRAVENOUS | Status: DC | PRN

## 2016-01-08 MED ORDER — DIPHENHYDRAMINE HCL 25 MG PO CAPS: 25.0000 mg | ORAL_CAPSULE | ORAL | Status: DC | PRN

## 2016-01-08 MED ORDER — LIDOCAINE HCL (PF) 1 % IJ SOLN: 30.0000 mL | INTRAMUSCULAR | Status: DC | PRN

## 2016-01-08 MED ORDER — WITCH HAZEL-GLYCERIN EX PADS: 1.0000 "application " | MEDICATED_PAD | CUTANEOUS | Status: DC | PRN

## 2016-01-08 MED ORDER — MORPHINE SULFATE (PF) 0.5 MG/ML IJ SOLN
INTRAMUSCULAR | Status: DC | PRN
  Administered 2016-01-08: 4 mg via EPIDURAL

## 2016-01-08 MED ORDER — METHYLERGONOVINE MALEATE 0.2 MG/ML IJ SOLN
INTRAMUSCULAR | Status: AC
  Filled 2016-01-08: qty 1

## 2016-01-08 SURGICAL SUPPLY — 34 items
BENZOIN TINCTURE PRP APPL 2/3 (GAUZE/BANDAGES/DRESSINGS) ×2 IMPLANT
CANISTER SUCT 3000ML PPV (MISCELLANEOUS) ×2 IMPLANT
CHLORAPREP W/TINT 26ML (MISCELLANEOUS) ×2 IMPLANT
DERMABOND ADHESIVE PROPEN (GAUZE/BANDAGES/DRESSINGS) ×1
DERMABOND ADVANCED .7 DNX6 (GAUZE/BANDAGES/DRESSINGS) ×1 IMPLANT
DRSG OPSITE POSTOP 4X10 (GAUZE/BANDAGES/DRESSINGS) ×2 IMPLANT
DRSG TELFA 3X8 NADH (GAUZE/BANDAGES/DRESSINGS) IMPLANT
ELECT CAUTERY BLADE 6.4 (BLADE) ×2 IMPLANT
ELECT REM PT RETURN 9FT ADLT (ELECTROSURGICAL) ×2
ELECTRODE REM PT RTRN 9FT ADLT (ELECTROSURGICAL) ×1 IMPLANT
GLOVE BIOGEL PI IND STRL 7.0 (GLOVE) ×2 IMPLANT
GLOVE BIOGEL PI IND STRL 7.5 (GLOVE) ×1 IMPLANT
GLOVE BIOGEL PI INDICATOR 7.0 (GLOVE) ×2
GLOVE BIOGEL PI INDICATOR 7.5 (GLOVE) ×1
GLOVE SURG SS PI 7.0 STRL IVOR (GLOVE) ×2 IMPLANT
GOWN STRL REUS W/ TWL LRG LVL3 (GOWN DISPOSABLE) ×2 IMPLANT
GOWN STRL REUS W/ TWL XL LVL3 (GOWN DISPOSABLE) ×1 IMPLANT
GOWN STRL REUS W/TWL LRG LVL3 (GOWN DISPOSABLE) ×2
GOWN STRL REUS W/TWL XL LVL3 (GOWN DISPOSABLE) ×1
LIQUID BAND (GAUZE/BANDAGES/DRESSINGS) ×2 IMPLANT
NS IRRIG 1000ML POUR BTL (IV SOLUTION) ×2 IMPLANT
PACK C SECTION WH (CUSTOM PROCEDURE TRAY) ×2 IMPLANT
PAD ABD 7.5X8 STRL (GAUZE/BANDAGES/DRESSINGS) ×2 IMPLANT
PAD OB MATERNITY 4.3X12.25 (PERSONAL CARE ITEMS) ×2 IMPLANT
PAD PREP 24X48 CUFFED NSTRL (MISCELLANEOUS) ×2 IMPLANT
SPONGE GAUZE 4X4 12PLY (GAUZE/BANDAGES/DRESSINGS) ×4 IMPLANT
STRIP CLOSURE SKIN 1/2X4 (GAUZE/BANDAGES/DRESSINGS) ×2 IMPLANT
SUT MON AB 4-0 PS1 27 (SUTURE) ×2 IMPLANT
SUT MON AB-0 CT1 36 (SUTURE) ×4 IMPLANT
SUT PLAIN 2 0 (SUTURE) ×1
SUT PLAIN ABS 2-0 CT1 27XMFL (SUTURE) ×1 IMPLANT
SUT VIC AB 0 CT1 36 (SUTURE) ×4 IMPLANT
SUT VIC AB 3-0 CT1 27 (SUTURE) ×1
SUT VIC AB 3-0 CT1 TAPERPNT 27 (SUTURE) ×1 IMPLANT

## 2016-01-08 NOTE — OR Nursing (Signed)
Pt brought to OR via stretcher per dr Ilda Basset for stat cesarean due to prolapsed cord.  No time out or initial count performed prior to case start.  Xray performed after case complete and cleared by Dr.  Abbott Pao transferred to PACU for recovery.

## 2016-01-08 NOTE — MAU Note (Signed)
Urine in lab 

## 2016-01-08 NOTE — MAU Note (Signed)
Contractions started at 0330. No bleeding. ? leaking while on the toilet. Hx of CHTN; no meds in 2 yrs.  No recent exam

## 2016-01-08 NOTE — Anesthesia Preprocedure Evaluation (Addendum)
Anesthesia Evaluation  Patient identified by MRN, date of birth, ID band Patient awake    Reviewed: Allergy & Precautions, H&P , NPO status , Patient's Chart, lab work & pertinent test results  Airway Mallampati: II  TM Distance: >3 FB Neck ROM: full    Dental no notable dental hx.    Pulmonary former smoker,    Pulmonary exam normal        Cardiovascular hypertension, Normal cardiovascular exam     Neuro/Psych negative neurological ROS  negative psych ROS   GI/Hepatic negative GI ROS, Neg liver ROS,   Endo/Other  Morbid obesity  Renal/GU negative Renal ROS     Musculoskeletal   Abdominal (+) + obese,   Peds  Hematology   Anesthesia Other Findings   Reproductive/Obstetrics (+) Pregnancy                            Anesthesia Physical Anesthesia Plan  ASA: III and emergent  Anesthesia Plan: Epidural   Post-op Pain Management:    Induction:   Airway Management Planned:   Additional Equipment:   Intra-op Plan:   Post-operative Plan:   Informed Consent: I have reviewed the patients History and Physical, chart, labs and discussed the procedure including the risks, benefits and alternatives for the proposed anesthesia with the patient or authorized representative who has indicated his/her understanding and acceptance.     Plan Discussed with:   Anesthesia Plan Comments: (Outside of room #9 dosing epidural for emergency csection due to prolapsed cord.)       Anesthesia Quick Evaluation

## 2016-01-08 NOTE — Progress Notes (Signed)
[name] is a 30 y.o. [id number] at [redacted]w[redacted]d admitted for active labor  Subjective: Comfortable with epidural, no c/o.  Objective: BP 129/71 mmHg  Pulse 85  Temp(Src) 98.4 F (36.9 C) (Axillary)  Resp 16  Ht 5\' 8"  (1.727 m)  Wt 267 lb (121.11 kg)  BMI 40.61 kg/m2  SpO2 100%  LMP 04/08/2015  Breastfeeding? Unknown   Total I/O In: -  Out: 1200 [Urine:500; Blood:700]  FHT:  FHR: 150 bpm, variability: moderate,  accelerations:  Present,  decelerations:  Absent UC:   irregular SVE:   6.5/70/-2 AROM: clear, large loop of cord identified at 2 o'clock to fetal vtx  Labs: Lab Results  Component Value Date   WBC 12.0* 01/08/2016   HGB 8.8* 01/08/2016   HCT 28.6* 01/08/2016   MCV 64.9* 01/08/2016   PLT 252 01/08/2016    Assessment / Plan: 38.[redacted] weeks gestation  Umbilical cord prolapse  Fetal Wellbeing:  Category I  Anticipated MOD:  stat CS, Dr. Ilda Basset and Dr. Ernestina Patches notified by nursing staff, fetal vtx held up off cord to [place] 01/08/2016, 1:33 PM

## 2016-01-08 NOTE — Brief Op Note (Signed)
01/08/2016  2:26 PM  PATIENT:  [name]  30 y.o. female  PRE-OPERATIVE DIAGNOSIS:  cesarean section for prolapsed cord   POST-OPERATIVE DIAGNOSIS:  cesarean section for prolapsed cord   PROCEDURE:  Procedure(s): CESAREAN SECTION (N/A)  SURGEON:  Surgeon(s) and Role:    * Aletha Halim, MD - Primary. Caren Macadam, MD Assisting  EBL:  Total I/O In: 1400 [I.V.:1400] Out: 1500 [Urine:500; Blood:1000]

## 2016-01-08 NOTE — Anesthesia Postprocedure Evaluation (Signed)
Anesthesia Post Note  Patient: [name]  Procedure(s) Performed: Procedure(s) (LRB): CESAREAN SECTION (N/A)  Patient location during evaluation: PACU Anesthesia Type: Epidural Level of consciousness: awake Pain management: pain level controlled Vital Signs Assessment: post-procedure vital signs reviewed and stable Respiratory status: spontaneous breathing Cardiovascular status: stable Postop Assessment: no headache, no backache, epidural receding, patient able to bend at knees and no signs of nausea or vomiting Anesthetic complications: no     Last Vitals:  Filed Vitals:   01/08/16 1545 01/08/16 1607  BP: 112/74 120/60  Pulse:  74  Temp:  36.8 C  Resp: 22 20    Last Pain:  Filed Vitals:   01/08/16 1620  PainSc: 0-No pain   Pain Goal: Patients Stated Pain Goal: 0 (01/08/16 0905)               Teliah Buffalo JR,JOHN Mateo Flow

## 2016-01-08 NOTE — Anesthesia Procedure Notes (Signed)
Epidural Patient location during procedure: OB Start time: 01/08/2016 9:55 AM  Staffing Anesthesiologist: Lyn Hollingshead Performed by: anesthesiologist   Preanesthetic Checklist Completed: patient identified, surgical consent, pre-op evaluation, timeout performed, IV checked, risks and benefits discussed and monitors and equipment checked  Epidural Patient position: sitting Prep: site prepped and draped and DuraPrep Patient monitoring: continuous pulse ox and blood pressure Approach: midline Location: L3-L4 Injection technique: LOR air  Needle:  Needle type: Tuohy  Needle gauge: 17 G Needle length: 9 cm and 9 Needle insertion depth: 7 cm Catheter type: closed end flexible Catheter size: 19 Gauge Catheter at skin depth: 12 cm Test dose: negative and Other  Assessment Sensory level: T9 Events: blood not aspirated, injection not painful, no injection resistance, negative IV test and no paresthesia  Additional Notes Reason for block:procedure for pain

## 2016-01-08 NOTE — Transfer of Care (Signed)
Immediate Anesthesia Transfer of Care Note  Patient: [name]  Procedure(s) Performed: Procedure(s): CESAREAN SECTION (N/A)  Patient Location: PACU  Anesthesia Type:Epidural  Level of Consciousness: awake, alert  and sedated  Airway & Oxygen Therapy: Patient Spontanous Breathing and Patient connected to nasal cannula oxygen  Post-op Assessment: Report given to RN and Post -op Vital signs reviewed and stable  Post vital signs: Reviewed and stable  Last Vitals:  Filed Vitals:   01/08/16 1300 01/08/16 1301  BP:  129/71  Pulse:  85  Temp: 36.9 C   Resp: 16     Last Pain:  Filed Vitals:   01/08/16 1326  PainSc: 0-No pain      Patients Stated Pain Goal: 0 ([id number])  Complications: No apparent anesthesia complications

## 2016-01-08 NOTE — H&P (Signed)
[name] is a 30 y.o. female presenting for spontaneous active labor, GBS positive in MAU.  Was evaluated in MAU and active labor, membranes intact.     Maternal Medical History:  Reason for admission: Contractions.   Contractions: Frequency: regular.   Perceived severity is mild.    Fetal activity: Perceived fetal activity is normal.   Last perceived fetal movement was within the past hour.      OB History    Gravida Para Term Preterm AB TAB SAB Ectopic Multiple Living   4 2 2  1  1   0 2     Past Medical History  Diagnosis Date  . Anemia (Hgb 9 on 08/2015)   . Asthma   . Trichomonas vaginitis   . Herpes   . Hypertension   . Fibroid     pedunculated posterior RUQ near fundus. approx 3-4cm at time of 01/2016 c-section   Past Surgical History  Procedure Laterality Date  . Dilation and curettage of uterus     Family History: family history includes Diabetes in her brother, mother, and sister; Heart disease in her mother; Hyperlipidemia in her mother; Hypertension in her mother. Social History:  reports that she has quit smoking. Her smoking use included Cigarettes. She has never used smokeless tobacco. She reports that she does not drink alcohol or use illicit drugs.   Prenatal Transfer Tool  Maternal Diabetes: No Genetic Screening: Normal Maternal Ultrasounds/Referrals: Normal Fetal Ultrasounds or other Referrals:  None Maternal Substance Abuse:  No Significant Maternal Medications:  None Significant Maternal Lab Results:  None Other Comments:  None  Review of Systems  Constitutional: Negative.   HENT: Negative.   Eyes: Negative.   Respiratory: Negative.   Cardiovascular: Negative.   Gastrointestinal: Negative.   Genitourinary: Negative.   Musculoskeletal: Negative.   Skin: Negative.   Neurological: Negative.   Endo/Heme/Allergies: Negative.   Psychiatric/Behavioral: Negative.     Dilation: 6 Effacement (%): 60 Station: Ballotable Exam by::  M.Merrill, RN Blood pressure 129/76, pulse 105, temperature 97.7 F (36.5 C), temperature source Oral, resp. rate 18, height 5\' 8"  (1.727 m), weight 267 lb (121.11 kg), last menstrual period 04/08/2015, not currently breastfeeding. Maternal Exam:  Uterine Assessment: Contraction strength is mild.  Abdomen: Patient reports no abdominal tenderness. Fetal presentation: vertex  Introitus: Normal vulva. Normal vagina.    Physical Exam  Constitutional: She is oriented to person, place, and time. She appears well-developed and well-nourished.  HENT:  Head: Normocephalic and atraumatic.  Eyes: Conjunctivae and EOM are normal. Pupils are equal, round, and reactive to light.  Neck: Normal range of motion. Neck supple.  Cardiovascular: Normal rate and regular rhythm.   Respiratory: Effort normal and breath sounds normal.  GI:  Gravida  Musculoskeletal: Normal range of motion.  Neurological: She is alert and oriented to person, place, and time.  Skin: Skin is warm and dry.    Prenatal labs: ABO, Rh: A/POS/-- (01/04 1017) Antibody: NEG (01/04 1017) Rubella: 1.53 (01/04 1017) RPR: NON REAC (03/15 0959)  HBsAg: NEGATIVE (01/04 1017)  HIV: NONREACTIVE (03/15 0959)  GBS: Positive (07/14 0000)   Assessment/Plan: #IUP -NST reactive -Admit to L&D for active labor -Trial pitocin    Melany Guernsey 01/08/2016, 9:12 AM

## 2016-01-08 NOTE — Lactation Note (Signed)
This note was copied from a baby's chart. Lactation Consultation Note  Patient Name: [name] Date: 01/08/2016 Reason for consult: Initial assessment Baby at 9 hr of life. Mom was asleep, information from FOB. He reports baby is latching better than either one of the other 2 children. Mom bf and formula for 81 m with the oldest. The oldest child is 29 yr old. She bf for 2 months with the middle child. The middle child is 20 m old. FOb was worried about supply so mom started using the DBEP today. She was able to get 8 ml. FOB is using a slow flow nipple to offer mom's expressed milk. Discussed baby behavior, feeding frequency, pumping, artifical nipples, baby belly size, voids, wt loss, breast changes, and nipple care. Given lactation handouts. Aware of OP services and support group.     Maternal Data    Feeding Feeding Type: Bottle Fed - Breast Milk Length of feed: 5 min  LATCH Score/Interventions                      Lactation Tools Discussed/Used Pump Review: Setup, frequency, and cleaning Initiated by:: Gaspar Cola RN Date initiated:: 01/08/16   Consult Status Consult Status: Follow-up Date: 01/09/16 Follow-up type: In-patient    Denzil Hughes 01/08/2016, 11:07 PM

## 2016-01-08 NOTE — Op Note (Addendum)
[name] PROCEDURE DATE: 01/08/2016  PREOPERATIVE DIAGNOSES: Intrauterine pregnancy at [redacted]w[redacted]d weeks gestation;  Code-Cesarean for Cord prolapse, POSTOPERATIVE DIAGNOSES: The same  PROCEDURE: Primary Transverse Cesarean Section  SURGEON:  Atlee Abide MD, Attending, Caren Macadam, MD -Fellow  ANESTHESIOLOGIST: Dr. Jillyn Hidden  INDICATIONS: [name] is a 30 y.o. [phone] at [redacted]w[redacted]d here for cesarean section secondary to the indications listed under preoperative diagnoses; please see preoperative note for further details.  The risks of cesarean section were discussed with the patient including but were not limited to: bleeding which may require transfusion or reoperation; infection which may require antibiotics; injury to bowel, bladder, ureters or other surrounding organs; injury to the fetus; need for additional procedures including hysterectomy in the event of a life-threatening hemorrhage; placental abnormalities wth subsequent pregnancies, incisional problems, thromboembolic phenomenon and other postoperative/anesthesia complications.   The patient concurred with the proposed plan, giving informed written consent for the procedure.    FINDINGS:  Viable female infant in cephalic presentation.  Apgars 8 and 9.  Clear amniotic fluid.  Intact placenta, three vessel cord.  Normal uterus, fallopian tubes and ovaries bilaterally. Cord pH=7.402  ANESTHESIA: Epidural INTRAVENOUS FLUIDS: 1400 ml ESTIMATED BLOOD LOSS: 1000 ml URINE OUTPUT:  500 ml SPECIMENS: L&D COMPLICATIONS: None immediate  PROCEDURE IN DETAIL:  The patient preoperatively received intravenous antibiotics and had sequential compression devices applied to her lower extremities.  She was then taken to the operating room where the epidural anesthesia was dosed up to surgical level and was found to be adequate. She was then placed in a dorsal supine position with a leftward tilt, and prepped and draped in  a sterile manner.  A foley catheter was placed into her bladder and attached to constant gravity.  After an adequate timeout was performed, a Joel-Cohen skin incision was made with scalpel and carried through to the underlying layer of fascia. The fascia was incised in the midline, and this incision was extended bilaterally bluntly.  Rectus muscles were seperated midline and and peritoneum was entered bluntly. Bladder blade and Rich placed.  Attention was turned to the lower uterine segment where a low transverse hysterotomy was made with a scalpel and extended bilaterally bluntly.  The infant was successfully delivered, the cord was clamped and cut after 1 minute and the infant, due to vigorous nature of infant, was handed over to awaiting neonatology team. Uterine massage was then administered, and the placenta delivered intact with a three-vessel cord. The uterus was then cleared of clot and debris.  The hysterotomy was closed with 0 Vicryl in a running locked fashion, and an imbricating layer was also placed with 0 Vicryl. Figure-of-eight serosal stitches were placed to help with hemostasis.  The pelvis was cleared of all clot and debris. Hemostasis was confirmed on all surfaces.  The peritoneum and the muscles were reapproximated using 0 Vicryl interrupted stitches. The fascia was then closed using 0 Vicryl in a running fashion.  The subcutaneous layer was irrigated,  then reapproximated with 2-0 plain gut interrupted stitches. The patient tolerated the procedure well. Sponge, lap, instrument and needle counts were correct x 2.  She was taken to the recovery room in stable condition.   Caren Macadam, MD , MPH, ABFM Family Medicine, OB Fellow [facility] Attending Supervision of Fellow: I was present for this procedure,  and  I have seen and examined the patient, reviewed the fellow's note and chart, and I agree with the  documentation. Xray done and results d/w  Radiology and finding on X ray is external the epidural catheter.   Durene Romans MD Attending [facility] [employer] [facility] (Faculty Practice) 01/10/2016 9:40 AM

## 2016-01-09 ENCOUNTER — Other Ambulatory Visit: Payer: Medicaid Other

## 2016-01-09 LAB — CBC
HCT: 24.2 % — ABNORMAL LOW (ref 36.0–46.0)
Hemoglobin: 7.6 g/dL — ABNORMAL LOW (ref 12.0–15.0)
MCH: 20.4 pg — ABNORMAL LOW (ref 26.0–34.0)
MCHC: 31.4 g/dL (ref 30.0–36.0)
MCV: 65.1 fL — ABNORMAL LOW (ref 78.0–100.0)
Platelets: 228 10*3/uL (ref 150–400)
RBC: 3.72 MIL/uL — ABNORMAL LOW (ref 3.87–5.11)
RDW: 17.2 % — ABNORMAL HIGH (ref 11.5–15.5)
WBC: 18.1 10*3/uL — ABNORMAL HIGH (ref 4.0–10.5)

## 2016-01-09 LAB — RPR: RPR Ser Ql: NONREACTIVE

## 2016-01-09 MED ORDER — FERROUS SULFATE 325 (65 FE) MG PO TABS
325.0000 mg | ORAL_TABLET | Freq: Two times a day (BID) | ORAL | Status: DC
  Administered 2016-01-09 – 2016-01-11 (×5): 325 mg via ORAL
  Filled 2016-01-09 (×5): qty 1

## 2016-01-09 NOTE — Progress Notes (Signed)
Post Partum Day 1, POD1 Subjective:  [name] is a 30 y.o. [id number] [redacted]w[redacted]d s/p pLTCS for prolapsed cord.  No acute events overnight. Patient reports feeling dizzy when walking but believes that it may be due to the pain medications that she received. She wants to see if she still feels dizzy today before considering any blood transfusions. She also reported nausea and vomiting yesterday but is starting to feel better today. Pt denies problems with voiding. Pain is well controlled.  She has not had flatus.  Lochia Minimal.  Plan for birth control is Nexplanon.  Method of Feeding: breast.  Objective: Blood pressure 109/57, pulse 80, temperature 98.4 F (36.9 C), temperature source Oral, resp. rate 17, height 5\' 8"  (1.727 m), weight 121.11 kg (267 lb), last menstrual period 04/08/2015, SpO2 95 %, unknown if currently breastfeeding.  Physical Exam:  General: alert, cooperative and no distress Chest: normal WOB Heart: Regular rate DVT Evaluation: no evidence of DVT seen on physical exam. Extremities: no edema   Recent Labs  01/08/16 1531 01/09/16 0551  HGB 8.2* 7.6*  HCT 25.5* 24.2*    Assessment/Plan:  ASSESSMENT: [name] is a 30 y.o. [id number] [redacted]w[redacted]d s/p pLTCS for prolapsed cord.  #Anemia: Hgb decreased 8.8 to 7.6. EBL 1050mL during C-section. Patient feels dizzy but is unsure if it is due to pain medications or anemia. -starting ferrous sulfate -consider blood transfusion if dizziness continues today  #Postpartum Care Continue routine PP care Breastfeeding support PRN  CNM attestation Post Partum Day #1   [name] is a 30 y.o. [id number] s/p pLTCS.  Pt denies problems with voiding or po intake. Feeling a bit dizzy w/ ambulation but feels this is due to pain medication. Pain is well controlled.  Plan for birth control is Nexplanon.  Method of Feeding: breast  PE:  BP 118/59 mmHg  Pulse 97  Temp(Src) 98.5 F (36.9 C) (Oral)  Resp 18   Ht 5\' 8"  (1.727 m)  Wt 121.11 kg (267 lb)  BMI 40.61 kg/m2  SpO2 95%  LMP 04/08/2015  Breastfeeding? Unknown Fundus firm  Plan for discharge: 01/10/16 Pt to inform nursing if still feeling dizzy as the day progresses; will start on po FeSO4 bid.  SHAW, KIMBERLY, CNM 1:13 AM

## 2016-01-09 NOTE — Addendum Note (Signed)
Addendum  created 01/09/16 [id number] by Asher Muir, CRNA   Modules edited: Clinical Notes   Clinical Notes:  File: [id number]

## 2016-01-09 NOTE — Anesthesia Postprocedure Evaluation (Signed)
Anesthesia Post Note  Patient: [name]  Procedure(s) Performed: Procedure(s) (LRB): CESAREAN SECTION (N/A)  Patient location during evaluation: Mother Baby Anesthesia Type: Epidural Level of consciousness: awake Pain management: satisfactory to patient Vital Signs Assessment: post-procedure vital signs reviewed and stable Respiratory status: spontaneous breathing Cardiovascular status: stable Anesthetic complications: no     Last Vitals:  Filed Vitals:   01/09/16 0335 01/09/16 0754  BP: 111/59 109/57  Pulse: 65 80  Temp: 36.6 C 36.9 C  Resp: 18 17    Last Pain:  Filed Vitals:   01/09/16 0755  PainSc: 8    Pain Goal: Patients Stated Pain Goal: 0 (01/08/16 0905)               Casimer Lanius

## 2016-01-10 NOTE — Clinical Documentation Improvement (Signed)
OB/GYN  Can the diagnosis of anemia be further specified?   Acute Blood Loss Anemia, including the suspected or known cause or associated condition(s)  Acute on chronic blood loss anemia, including the suspected or known cause or associated condition(s)  Chronic blood loss anemia, including the suspected or known cause or associated condition(s)  Precipitous drop in Hematocrit, including the suspected or known cause or associated condition(s)  Other  Clinically Undetermined  Document any associated diagnoses/conditions. Please update your documentation within the medical record to reflect your response to this query. Thank you.  Supporting Information:(As per notes) "Anemia: Hgb decreased 8.8 to 7.6. EBL 1076mL during C-section. Patient feels dizzy but is unsure if it is due to pain medications or anemia.  -starting ferrous sulfate  -consider blood transfusion if dizziness continues today"   Please exercise your independent, professional judgment when responding. A specific answer is not anticipated or expected.  Thank You, Alessandra Grout, RN, BSN, CCDS,Clinical Documentation Specialist:  [phone]  [phone]=[facility]- Health Information Management

## 2016-01-10 NOTE — Discharge Summary (Signed)
OB Discharge Summary     Patient Name: [name] DOB: [birth year] MRN: [id number]  Date of admission: 01/08/2016 Delivering MD: Lauretta Chester NILES   Date of discharge: 01/11/2016  Admitting diagnosis: LABOR Intrauterine pregnancy: [redacted]w[redacted]d     Secondary diagnosis:  Principal Problem:   S/P primary low transverse C-section Active Problems:   Herpes simplex type 2 (HSV-2) infection affecting pregnancy, antepartum   Sickle cell trait ([place])   Anemia affecting pregnancy, antepartum   Short interval between pregnancies affecting pregnancy, antepartum   Morbid (severe) obesity due to excess calories ([place])  Additional problems: None     Discharge diagnosis: Term Pregnancy Delivered                                                                                                Post partum procedures:None  Augmentation: AROM  Complications: None  Hospital course:  Onset of Labor With Unplanned C/S  30 y.o. yo [id number] at [redacted]w[redacted]d was admitted in [place] on 01/08/2016. Patient had a labor course significant for AROM with cord prolapse noted immediately afterwards. Membrane Rupture Time/Date: 1:11 PM ,01/08/2016   The patient went for cesarean section due to Cord Prolapse, and delivered a Viable infant,01/08/2016  Details of operation can be found in separate operative note. Patient had an uncomplicated postpartum course.  She is ambulating,tolerating a regular diet, passing flatus, and urinating well.  Patient is discharged home in stable condition 01/11/2016.  Physical exam  Filed Vitals:   01/10/16 0502 01/10/16 0900 01/10/16 1736 01/11/16 0547  BP: 117/62 118/60 118/59 134/65  Pulse: 87 101 97 85  Temp: 98.2 F (36.8 C)  98.5 F (36.9 C) 98.7 F (37.1 C)  TempSrc: Oral  Oral Oral  Resp: 20 20 18 18   Height:      Weight:      SpO2:       General: alert, cooperative and no distress Lochia: appropriate Uterine Fundus: firm Incision: Healing well with no significant  drainage, No significant erythema, Dressing is clean, dry, and intact DVT Evaluation: No evidence of DVT seen on physical exam. Labs: Lab Results  Component Value Date   WBC 18.1* 01/09/2016   HGB 7.6* 01/09/2016   HCT 24.2* 01/09/2016   MCV 65.1* 01/09/2016   PLT 228 01/09/2016   CMP Latest Ref Rng 08/07/2015  Glucose 65 - 99 mg/dL 128(H)  BUN 7 - 25 mg/dL 5(L)  Creatinine 0.50 - 1.10 mg/dL 0.70  Sodium 135 - 146 mmol/L 134(L)  Potassium 3.5 - 5.3 mmol/L 3.8  Chloride 98 - 110 mmol/L 102  CO2 20 - 31 mmol/L 22  Calcium 8.6 - 10.2 mg/dL 8.7  Total Protein 6.1 - 8.1 g/dL 6.3  Total Bilirubin 0.2 - 1.2 mg/dL 0.3  Alkaline Phos 33 - 115 U/L 79  AST 10 - 30 U/L 10  ALT 6 - 29 U/L 8    Discharge instruction: per After Visit Summary and "Baby and Me Booklet".  After visit meds:    Medication List    STOP taking these medications  cyclobenzaprine 5 MG tablet  Commonly known as:  FLEXERIL     ondansetron 4 MG disintegrating tablet  Commonly known as:  ZOFRAN ODT      TAKE these medications        albuterol 108 (90 Base) MCG/ACT inhaler  Commonly known as:  PROVENTIL HFA;VENTOLIN HFA  Inhale 2 puffs into the lungs every 6 (six) hours as needed for wheezing or shortness of breath.     ferrous sulfate 325 (65 FE) MG tablet  Take 1 tablet (325 mg total) by mouth 2 (two) times daily with a meal.     ibuprofen 600 MG tablet  Commonly known as:  ADVIL,MOTRIN  Take 1 tablet (600 mg total) by mouth every 6 (six) hours.     oxyCODONE-acetaminophen 5-325 MG tablet  Commonly known as:  PERCOCET/ROXICET  Take 1-2 tablets by mouth every 4 (four) hours as needed (pain scale 4-7).        Diet: routine diet  Activity: Advance as tolerated. Pelvic rest for 6 weeks.   Outpatient follow up: 1 week for wound check Follow up Appt:No future appointments. Follow up Visit:No Follow-up on file.  Postpartum contraception: Nexplanon  Newborn Data: Live born female  Birth  Weight: 6 lb 11.8 oz (3055 g) APGAR: 8, 9  Baby Feeding: Breast Disposition:home with mother   01/11/2016 Mercy Riding, MD   OB fellow attestation I have seen and examined this patient and agree with above documentation in the resident's note.   WINOGENE FICKLIN is a 30 y.o. [id number] s/p pLTCS.   Pain is well controlled.  Plan for birth control is Nexplanon.  Method of Feeding: breast  PE:  BP 134/65 mmHg  Pulse 85  Temp(Src) 98.7 F (37.1 C) (Oral)  Resp 18  Ht 5\' 8"  (1.727 m)  Wt 121.11 kg (267 lb)  BMI 40.61 kg/m2  SpO2 95%  LMP 04/08/2015  Breastfeeding? Unknown Fundus firm  No results for input(s): HGB, HCT in the last 72 hours.   Plan: discharge today - postpartum care discussed - f/u clinic in 1 week for wound check, then 6wk PP visit   SHAW, KIMBERLY, CNM 4:52 PM

## 2016-01-10 NOTE — Progress Notes (Signed)
POSTPARTUM PROGRESS NOTE  Post Partum Day 2 Subjective:  [name] is a 30 y.o. [id number] [redacted]w[redacted]d s/p stat c-section for prolapse cord after AROM.  No acute events overnight.  Pt denies problems with ambulating, voiding or po intake.  She denies nausea or vomiting.  Pain is well controlled.  She has had flatus. She has not had bowel movement.  Lochia Small.   Objective: Blood pressure 117/62, pulse 87, temperature 98.2 F (36.8 C), temperature source Oral, resp. rate 20, height 5\' 8"  (1.727 m), weight 267 lb (121.11 kg), last menstrual period 04/08/2015, SpO2 95 %, unknown if currently breastfeeding.  Physical Exam:  General: alert, cooperative and no distress Lochia:normal flow Chest: CTAB Heart: RRR no m/r/g Abdomen: +BS, soft, nontender,  Uterine Fundus: firm, 2 below umbilicus DVT Evaluation: No calf swelling or tenderness Extremities: No edema   Recent Labs  01/08/16 1531 01/09/16 0551  HGB 8.2* 7.6*  HCT 25.5* 24.2*    Assessment/Plan:  ASSESSMENT: [name] is a 30 y.o. [id number] [redacted]w[redacted]d s/p pLTCS for cord prolapse.    Plan for discharge tomorrow, Breastfeeding and Contraception Nexplanon   LOS: 2 days   Melany Guernsey 01/10/2016, 8:48 AM  fibrocystic breast disease d

## 2016-01-10 NOTE — Lactation Note (Signed)
This note was copied from a baby's chart. Lactation Consultation Note  Assisted mother with latching [name].  She was able to achieve a deep latch and with breast compression may swallows were heard.  The opposite side began to drip when baby was feeding on the right breast.  Mom had been asking for formula but when teaching was done and she saw the amount of milk she had she agreed to cup feed it to [name] if supplementation was needed or desired. Mom pumped at least 20 ml for the left breast.  Many questions answered and much encouragement given. FOB is very supportive and helpful.  A foley cup was left in the room and parents were instructed to call for demonstration prior to use. RN is aware of this. FU tomorrow. Patient Name: [name] [id number] Date: 01/10/2016 Reason for consult: Follow-up assessment   Maternal Data Has patient been taught Hand Expression?: Yes  Feeding Feeding Type: Breast Fed Length of feed: 15 min  LATCH Score/Interventions Latch: Repeated attempts needed to sustain latch, nipple held in mouth throughout feeding, stimulation needed to elicit sucking reflex.  Audible Swallowing: A few with stimulation (Many with stimulation)  Type of Nipple: Everted at rest and after stimulation  Comfort (Breast/Nipple): Filling, red/small blisters or bruises, mild/mod discomfort     Hold (Positioning): Assistance needed to correctly position infant at breast and maintain latch.  LATCH Score: 6  Lactation Tools Discussed/Used     Consult Status Consult Status: Follow-up Date: 01/11/16 Follow-up type: In-patient    Van Clines 01/10/2016, 3:07 PM

## 2016-01-11 MED ORDER — IBUPROFEN 600 MG PO TABS: 600.0000 mg | ORAL_TABLET | Freq: Four times a day (QID) | ORAL | Status: DC

## 2016-01-11 MED ORDER — OXYCODONE-ACETAMINOPHEN 5-325 MG PO TABS: 1.0000 | ORAL_TABLET | ORAL | Status: DC | PRN

## 2016-01-11 MED ORDER — FERROUS SULFATE 325 (65 FE) MG PO TABS: 325.0000 mg | ORAL_TABLET | Freq: Two times a day (BID) | ORAL | Status: DC

## 2016-01-11 NOTE — Discharge Instructions (Signed)

## 2016-01-11 NOTE — Lactation Note (Signed)
This note was copied from a baby's chart. Lactation Consultation Note  Patient Name: [name] [id number] Date: 01/11/2016 Reason for consult: Follow-up assessment  With this mom of a term baby, now 38 hours old. Mom is pumping with DEP due to sore nipples, and already expressing up to 60 ml's. The baby is overeating, and spitting up after feeds. i advised mom to feed 30 ml's at a time, and feed with cues. Due to sore nipples, I examined baby's mouth, and found a posterior thick, short frenulum and an upper lip frenulum that extends to the gum line. i told Dr. Owens Shark, and she explained this to the parents, and explained that she was not able to clip this frenulum. Parents were given tongue-tie handouts, to inform them about tongue tie. They are using [facility], and I advised parents to have their pediatrician speak to them about TT. Mom loaned a [facility] DEP for 12 days, and I faxed [employer] , explaining why mom needs a DEP.    Maternal Data    Feeding    LATCH Score/Interventions                      Lactation Tools Discussed/Used WIC Program: Yes (fax sent for DEP) Pump Review: Setup, frequency, and cleaning;Milk Storage;Other (comment) (hand expression reviewed)   Consult Status Consult Status: Follow-up Follow-up type: Call as needed    Tonna Corner 01/11/2016, 10:51 AM

## 2016-01-20 ENCOUNTER — Ambulatory Visit: Payer: Medicaid Other

## 2016-01-20 VITALS — BP 132/85 | HR 89

## 2016-01-20 DIAGNOSIS — Z5189 Encounter for other specified aftercare: Secondary | ICD-10-CM

## 2016-01-20 NOTE — Progress Notes (Signed)
Pt presented today for a wound check.  Her dressing from her C-section has been removed no drainage or signs of infection. Pt will follow up at her postpartum appointment.

## 2016-02-03 ENCOUNTER — Encounter (HOSPITAL_COMMUNITY): Payer: Self-pay

## 2016-02-03 ENCOUNTER — Ambulatory Visit: Payer: Medicaid Other

## 2016-02-03 ENCOUNTER — Inpatient Hospital Stay (HOSPITAL_COMMUNITY)
Admission: AD | Admit: 2016-02-03 | Discharge: 2016-02-03 | Disposition: A | Discharge status: Home / Self Care | Payer: Medicaid Other | Source: Ambulatory Visit | Attending: Obstetrics and Gynecology | Admitting: Obstetrics and Gynecology

## 2016-02-03 DIAGNOSIS — G8918 Other acute postprocedural pain: Secondary | ICD-10-CM | POA: Insufficient documentation

## 2016-02-03 DIAGNOSIS — O9089 Other complications of the puerperium, not elsewhere classified: Secondary | ICD-10-CM | POA: Insufficient documentation

## 2016-02-03 DIAGNOSIS — Z87891 Personal history of nicotine dependence: Secondary | ICD-10-CM | POA: Diagnosis not present

## 2016-02-03 LAB — URINE MICROSCOPIC-ADD ON: WBC, UA: NONE SEEN WBC/hpf (ref 0–5)

## 2016-02-03 LAB — URINALYSIS, ROUTINE W REFLEX MICROSCOPIC
Bilirubin Urine: NEGATIVE
Glucose, UA: NEGATIVE mg/dL
Ketones, ur: NEGATIVE mg/dL
Leukocytes, UA: NEGATIVE
Nitrite: NEGATIVE
Protein, ur: NEGATIVE mg/dL
Specific Gravity, Urine: 1.015 (ref 1.005–1.030)
pH: 6 (ref 5.0–8.0)

## 2016-02-03 LAB — CBC
HCT: 29.4 % — ABNORMAL LOW (ref 36.0–46.0)
Hemoglobin: 9.1 g/dL — ABNORMAL LOW (ref 12.0–15.0)
MCH: 20.4 pg — ABNORMAL LOW (ref 26.0–34.0)
MCHC: 31 g/dL (ref 30.0–36.0)
MCV: 65.9 fL — ABNORMAL LOW (ref 78.0–100.0)
Platelets: 291 10*3/uL (ref 150–400)
RBC: 4.46 MIL/uL (ref 3.87–5.11)
RDW: 17.9 % — ABNORMAL HIGH (ref 11.5–15.5)
WBC: 4.8 10*3/uL (ref 4.0–10.5)

## 2016-02-03 MED ORDER — IBUPROFEN 600 MG PO TABS: 600.0000 mg | ORAL_TABLET | Freq: Four times a day (QID) | ORAL | Status: DC | PRN

## 2016-02-03 MED ORDER — OXYCODONE-ACETAMINOPHEN 5-325 MG PO TABS: 1.0000 | ORAL_TABLET | ORAL | Status: DC | PRN

## 2016-02-03 MED ORDER — IBUPROFEN 800 MG PO TABS
800.0000 mg | ORAL_TABLET | Freq: Once | ORAL | Status: AC
  Administered 2016-02-03: 800 mg via ORAL
  Filled 2016-02-03: qty 1

## 2016-02-03 MED ORDER — OXYCODONE-ACETAMINOPHEN 5-325 MG PO TABS
2.0000 | ORAL_TABLET | Freq: Once | ORAL | Status: AC
  Administered 2016-02-03: 2 via ORAL
  Filled 2016-02-03: qty 2

## 2016-02-03 MED ORDER — KETOROLAC TROMETHAMINE 60 MG/2ML IM SOLN
60.0000 mg | INTRAMUSCULAR | Status: DC
  Filled 2016-02-03: qty 2

## 2016-02-03 NOTE — Discharge Instructions (Signed)

## 2016-02-03 NOTE — MAU Provider Note (Signed)
Chief Complaint: Incisional Pain   None     SUBJECTIVE HPI: [name] is a 30 y.o. [phone] who is 3.5 weeks postpartum following emergency PLTCS for cord prolapse who presents to maternity admissions reporting incisional pain. She reports her postop pain improved after the first 2 weeks, but then in the last 2 days has increased. Her pain is sharp, intermittent, only when she moves, and bilaterally in her lower abdomen.  She reports her husband is out of town and she has probably overdone it with her 3 children.  She is taking her Percocet that was prescribed after the C/S but it makes her groggy so she only takes it at night. She has not taken ibuprofen for a few days.   She denies vaginal bleeding, vaginal itching/burning, urinary symptoms, h/a, dizziness, n/v, or fever/chills.     HPI  Past Medical History  Diagnosis Date  . Asthma   . Trichomonas vaginitis   . Herpes   . Hypertension   . Fibroid     pedunculated posterior RUQ near fundus. approx 3-4cm at time of 01/2016 c-section   Past Surgical History  Procedure Laterality Date  . Dilation and curettage of uterus    . Cesarean section N/A 01/08/2016    Procedure: CESAREAN SECTION;  Surgeon: Aletha Halim, MD;  Location: [facility];  Service: Obstetrics;  Laterality: N/A;   Social History   Social History  . Marital Status: Married    Spouse Name: N/A  . Number of Children: N/A  . Years of Education: N/A   Occupational History  . Not on file.   Social History Main Topics  . Smoking status: Former Smoker    Types: Cigarettes  . Smokeless tobacco: Never Used     Comment: quit 2013  . Alcohol Use: No     Comment: occasional, not with preg  . Drug Use: No  . Sexual Activity: Yes    Birth Control/ Protection: None     Comment: last had intercourse  12/05/15   Other Topics Concern  . Not on file   Social History Narrative   No current facility-administered medications on file prior to  encounter.   Current Outpatient Prescriptions on File Prior to Encounter  Medication Sig Dispense Refill  . ibuprofen (ADVIL,MOTRIN) 600 MG tablet Take 1 tablet (600 mg total) by mouth every 6 (six) hours. 30 tablet 0  . oxyCODONE-acetaminophen (PERCOCET/ROXICET) 5-325 MG tablet Take 1-2 tablets by mouth every 4 (four) hours as needed (pain scale 4-7). (Patient taking differently: Take by mouth at bedtime as needed (pain scale 4-7). ) 40 tablet 0  . albuterol (PROVENTIL HFA;VENTOLIN HFA) 108 (90 BASE) MCG/ACT inhaler Inhale 2 puffs into the lungs every 6 (six) hours as needed for wheezing or shortness of breath. 1 Inhaler 2  . ferrous sulfate 325 (65 FE) MG tablet Take 1 tablet (325 mg total) by mouth 2 (two) times daily with a meal. (Patient not taking: Reported on 01/20/2016) 60 tablet 3   No Known Allergies  ROS:  Review of Systems  Constitutional: Negative for fever, chills and fatigue.  Respiratory: Negative for shortness of breath.   Cardiovascular: Negative for chest pain.  Gastrointestinal: Positive for abdominal pain. Negative for nausea, vomiting and constipation.  Genitourinary: Positive for pelvic pain. Negative for dysuria, flank pain, vaginal bleeding, vaginal discharge, difficulty urinating and vaginal pain.  Neurological: Negative for dizziness and headaches.  Psychiatric/Behavioral: Negative.      I have reviewed patient's Past Medical  Hx, Surgical Hx, Family Hx, Social Hx, medications and allergies.   Physical Exam  Patient Vitals for the past 24 hrs:  BP Temp Pulse Resp Height Weight  02/03/16 1143 - - - - 5\' 8"  (1.727 m) 240 lb (108.863 kg)  02/03/16 1137 137/85 mmHg 98.8 F (37.1 C) 88 18 - -   Constitutional: Well-developed, well-nourished female in no acute distress.  Cardiovascular: normal rate Respiratory: normal effort GI: Abd soft, tender bilaterally in inguinal areas. Pos BS x 4 MS: Extremities nontender, no edema, normal ROM Neurologic: Alert and  oriented x 4.  GU: Neg CVAT. Skin: incision with good approximation, no erythema, edema, or exudate, soft to palpation around incision    LAB RESULTS  Results for orders placed or performed during the hospital encounter of 02/03/16 (from the past 48 hour(s))  Urinalysis, Routine w reflex microscopic (not at [facility])     Status: Abnormal   Collection Time: 02/03/16 11:25 AM  Result Value Ref Range   Color, Urine YELLOW YELLOW   APPearance CLEAR CLEAR   Specific Gravity, Urine 1.015 1.005 - 1.030   pH 6.0 5.0 - 8.0   Glucose, UA NEGATIVE NEGATIVE mg/dL   Hgb urine dipstick TRACE (A) NEGATIVE   Bilirubin Urine NEGATIVE NEGATIVE   Ketones, ur NEGATIVE NEGATIVE mg/dL   Protein, ur NEGATIVE NEGATIVE mg/dL   Nitrite NEGATIVE NEGATIVE   Leukocytes, UA NEGATIVE NEGATIVE  Urine microscopic-add on     Status: Abnormal   Collection Time: 02/03/16 11:25 AM  Result Value Ref Range   Squamous Epithelial / LPF 0-5 (A) NONE SEEN   WBC, UA NONE SEEN 0 - 5 WBC/hpf   RBC / HPF 0-5 0 - 5 RBC/hpf   Bacteria, UA RARE (A) NONE SEEN  CBC     Status: Abnormal   Collection Time: 02/03/16 12:19 PM  Result Value Ref Range   WBC 4.8 4.0 - 10.5 K/uL   RBC 4.46 3.87 - 5.11 MIL/uL   Hemoglobin 9.1 (L) 12.0 - 15.0 g/dL   HCT 29.4 (L) 36.0 - 46.0 %   MCV 65.9 (L) 78.0 - 100.0 fL   MCH 20.4 (L) 26.0 - 34.0 pg   MCHC 31.0 30.0 - 36.0 g/dL   RDW 17.9 (H) 11.5 - 15.5 %   Platelets 291 150 - 400 K/uL    --/--/A POS (06/07 0845)   MAU Management/MDM: Ordered labs and reviewed results.  No evidence of acute abdomen or infection.  Incision healing well with minimal pain to palpation.  Pain is likely musculoskeletal, related to pt taking care of her 3 small children without any help at home.  Discussed with pt need to ask for help, and ways to limit activity such as getting items from upstairs all at one time so she does not go up and down stairs several times.  Pt states understanding.  Rx renewed for ibuprofen  and Percocet.  Pt to rest as much as possible for next few days.  Treatmens in MAU included Ibuprofen 800 mg PO and Percocet 5/325, 2 tabs PO with significant improvement in pt pain. Pt stable at time of discharge.  ASSESSMENT  1. Postoperative pain    PLAN Discharge home Reasons to return to MAU/follow up in [place] reviewed    Medication List    ASK your doctor about these medications        albuterol 108 (90 Base) MCG/ACT inhaler  Commonly known as:  PROVENTIL HFA;VENTOLIN HFA  Inhale 2 puffs into the  lungs every 6 (six) hours as needed for wheezing or shortness of breath.     ferrous sulfate 325 (65 FE) MG tablet  Take 1 tablet (325 mg total) by mouth 2 (two) times daily with a meal.     ibuprofen 600 MG tablet  Commonly known as:  ADVIL,MOTRIN  Take 1 tablet (600 mg total) by mouth every 6 (six) hours.     oxyCODONE-acetaminophen 5-325 MG tablet  Commonly known as:  PERCOCET/ROXICET  Take 1-2 tablets by mouth every 4 (four) hours as needed (pain scale 4-7).         Fatima Blank Certified Nurse-Midwife 02/03/2016  12:15 PM

## 2016-02-03 NOTE — MAU Note (Signed)
Pt presents to MAU with complaints of pain in her C/S incision. PT delivered via C/S on June the 7th. Denies any fever

## 2016-02-18 ENCOUNTER — Ambulatory Visit (INDEPENDENT_AMBULATORY_CARE_PROVIDER_SITE_OTHER): Payer: Medicaid Other | Admitting: Family

## 2016-02-18 ENCOUNTER — Ambulatory Visit (INDEPENDENT_AMBULATORY_CARE_PROVIDER_SITE_OTHER): Payer: Medicaid Other | Admitting: Clinical

## 2016-02-18 ENCOUNTER — Encounter: Payer: Self-pay | Admitting: Family

## 2016-02-18 DIAGNOSIS — F4323 Adjustment disorder with mixed anxiety and depressed mood: Secondary | ICD-10-CM

## 2016-02-18 NOTE — Progress Notes (Signed)
  ASSESSMENT: Pt currently experiencing Adjustment disorder with mixed anxious and depressed mood. Pt needs to f/u with OB and [facility]. Pt would benefit from psychoeducation and brief therapeutic interventions. Pt may benefit from community resources. Stage of Change: contemplative  PLAN: 1. F/U with behavioral health clinician in one month, or as needed 2. Psychiatric Medications: none 3. Behavioral recommendations:   -Call list of community agencies that help with utilities -Read educational material regarding coping with symptoms of anxiety and depression -Consider Feelings After Birth support group at [place]: Pt. referred by Kathrine Haddock, CNM, for symptoms of anxiety and depression Pt. reports the following symptoms/concerns: Pt states that her primary concern is financial (uncertainty over paying utilities and child care while on maternity leave); also stress over husband leaving prior to daughter's birth, three deaths in family since beginning of June (grandmother, two adult cousins), and attending online classes in psychology. Too little sleep, overeating, feeling anxious over finances. Patient copes by keeping positive outlook. Duration of problem: At least one month Severity: moderate   OBJECTIVE: Orientation & Cognition: Oriented x3. Thought processes normal and appropriate to situation. Mood: appropriate Affect: appropriate Appearance: appropriate Risk of harm to self or others: no known risk of harm to self or others Substance use: none Assessments administered: PHQ9: 15/ GAD7: 12  Diagnosis: Adjustment disorder with mixed anxious and depressed mood CPT Code: F43.23  -------------------------------------------- Other(s) present in the room: newborn daughter  Time spent with patient in exam room: 30 minutes 10:48 to 11:18am  Depression screen PHQ 2/9 02/18/2016  Decreased Interest 1  Down, Depressed, Hopeless 2  PHQ - 2 Score 3  Altered sleeping 3   Tired, decreased energy 2  Change in appetite 3  Feeling bad or failure about yourself  2  Trouble concentrating 2  Moving slowly or fidgety/restless 0  Suicidal thoughts 0  PHQ-9 Score 15    GAD 7 : Generalized Anxiety Score 02/18/2016  Nervous, Anxious, on Edge 1  Control/stop worrying 1  Worry too much - different things 2  Trouble relaxing 2  Restless 1  Easily annoyed or irritable 2  Afraid - awful might happen 3  Total GAD 7 Score 12

## 2016-02-18 NOTE — Progress Notes (Signed)
Patient ID: [name], female   DO: [birth year], 30 y.o.   MRM: [id number] Subjective:     [name] is a 30 y.o. female who presents for a postpartum visit. She is 5 weeks postpartum following delivery on 01/08/2016. I have fully reviewed the prenatal and intrapartum course. The delivery was at 101 gestational weeks. Outcome: C-section. Anesthesia: Epidural. Postpartum course has been uncomplicated. Baby's course has been uncomplicated. Baby is feeding by bottle feeding. Bleeding not at this time. Bowel function is normal. Bladder function is normal. Patient is not sexually active. Contraception method patient is scheduled for health department next week for Nexplanon. . Postpartum depression screening: Positive.  No thoughts of harming self or others.    Review of Systems Pertinent items are noted in HPI.   Objective:    BP 130/74 mmHg  Pulse 84  Wt 249 lb 8 oz (113.172 kg)        General:  alert, cooperative and appears stated age   Breasts:  inspection negative, no nipple discharge or bleeding, no masses or nodularity palpable  Lungs: clear to auscultation bilaterally  Heart:  regular rate and rhythm, S1, S2 normal, no murmur, click, rub or gallop  Abdomen: soft, non-tender; bowel sounds normal; no masses,  no organomegaly; incision site healing well, no signs of infection  Pelvic exam not indicated - not bleeding, no pain at vaginal area Assessment:     Normal postpartum exam. Pap smear not done at today's visit.   Collected at [facility] last week.  Plan:    1. Contraception: desires Nexplanon, will reschedule for next week 2. Roselyn Reef met with patient regarding PP Screen 3. Follow up in: 1 week for Nexplanon or as needed.    Venia Carbon Michiel Cowboy, CNM

## 2016-02-20 ENCOUNTER — Ambulatory Visit: Payer: Medicaid Other | Admitting: Student

## 2016-02-29 IMAGING — CR DG FOOT COMPLETE 3+V*L*
3 series · 3 of 3 positions shown · non-contrast
Comparison: None.

CLINICAL DATA: Left foot pain.  Left ankle pain.

EXAM:
LEFT FOOT - COMPLETE 3+ VIEW

[t foot ap left]
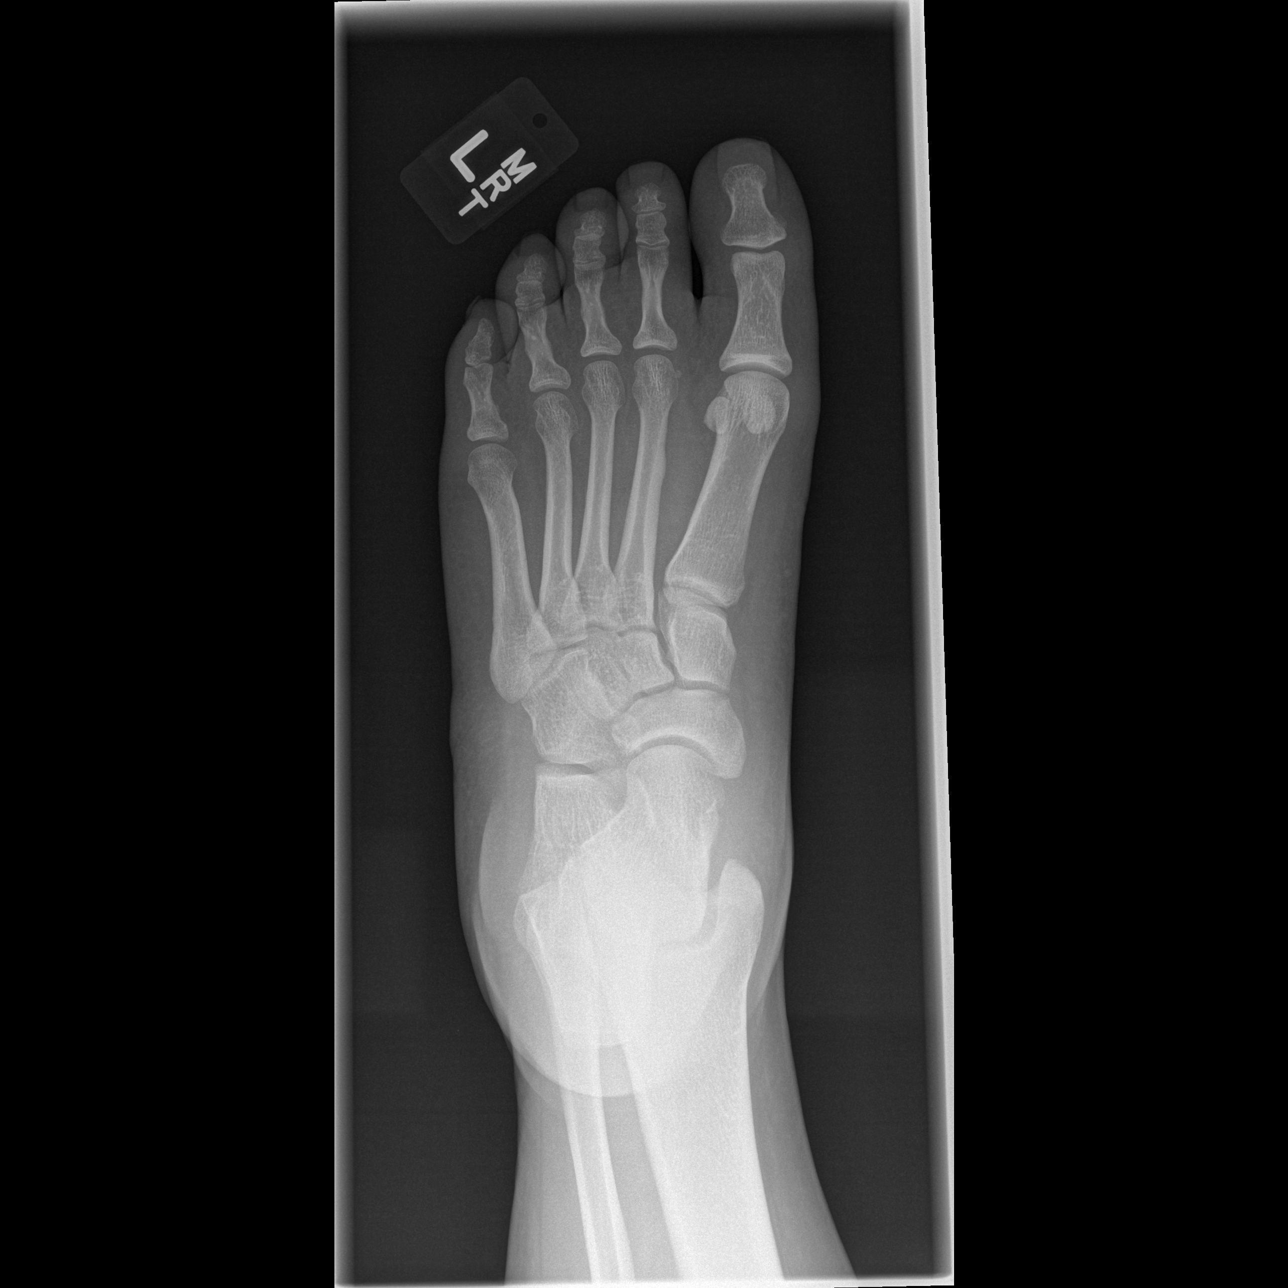

[t foot oblique left]
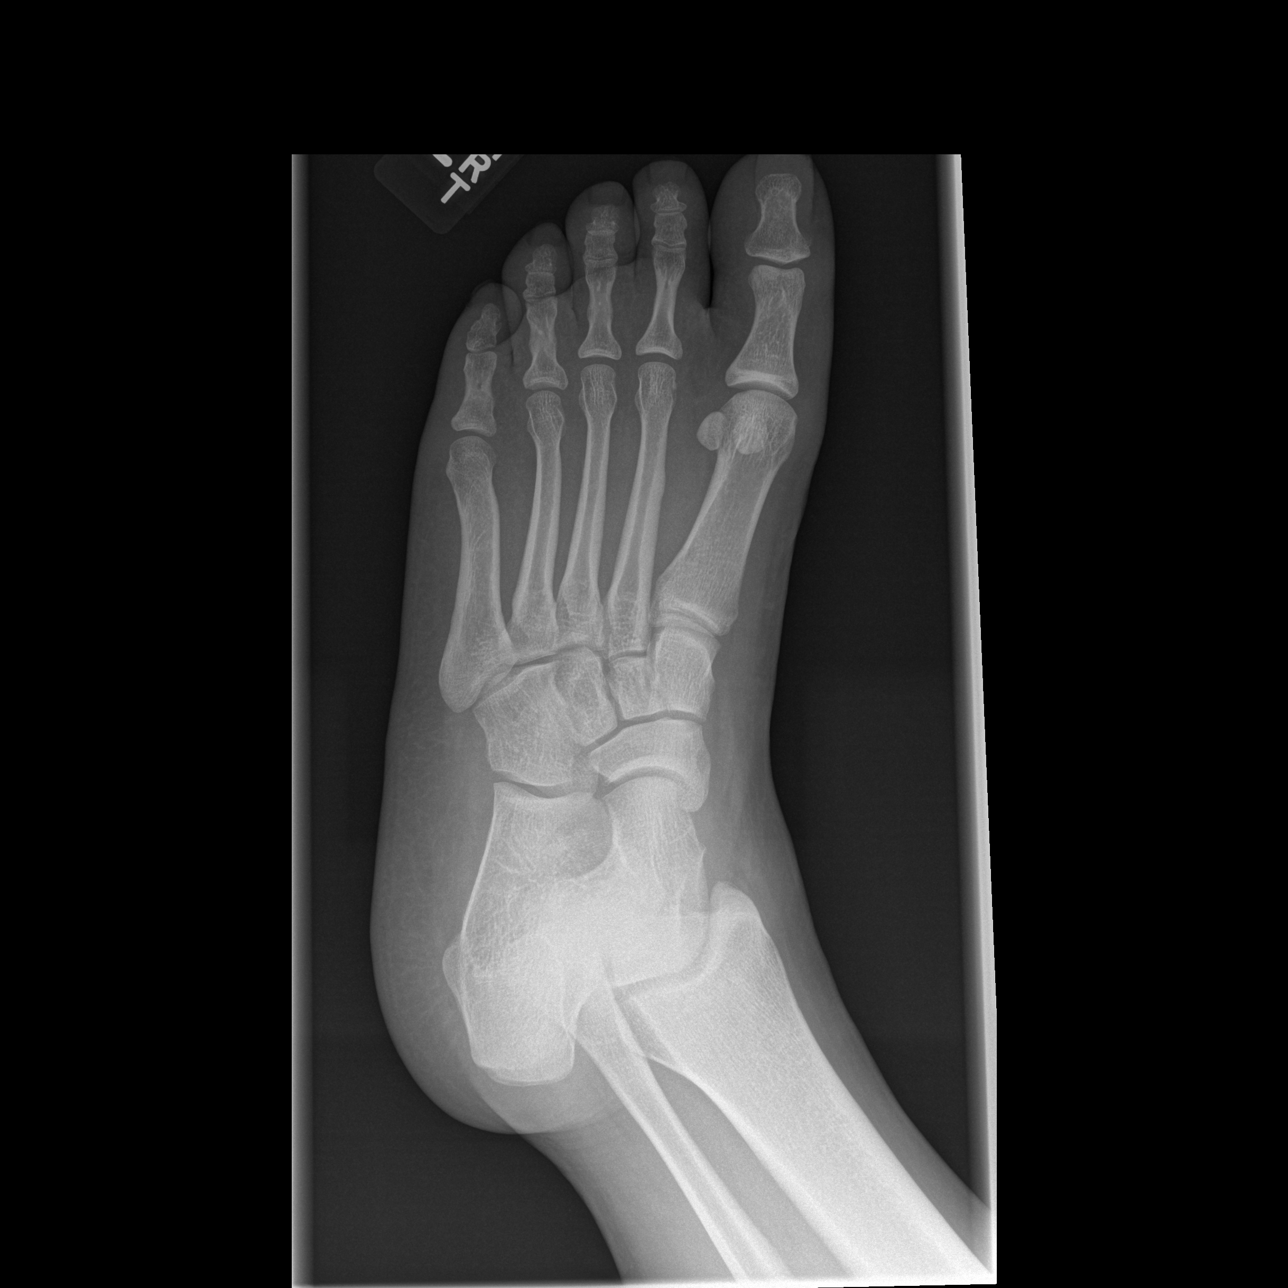

[t foot lat left]
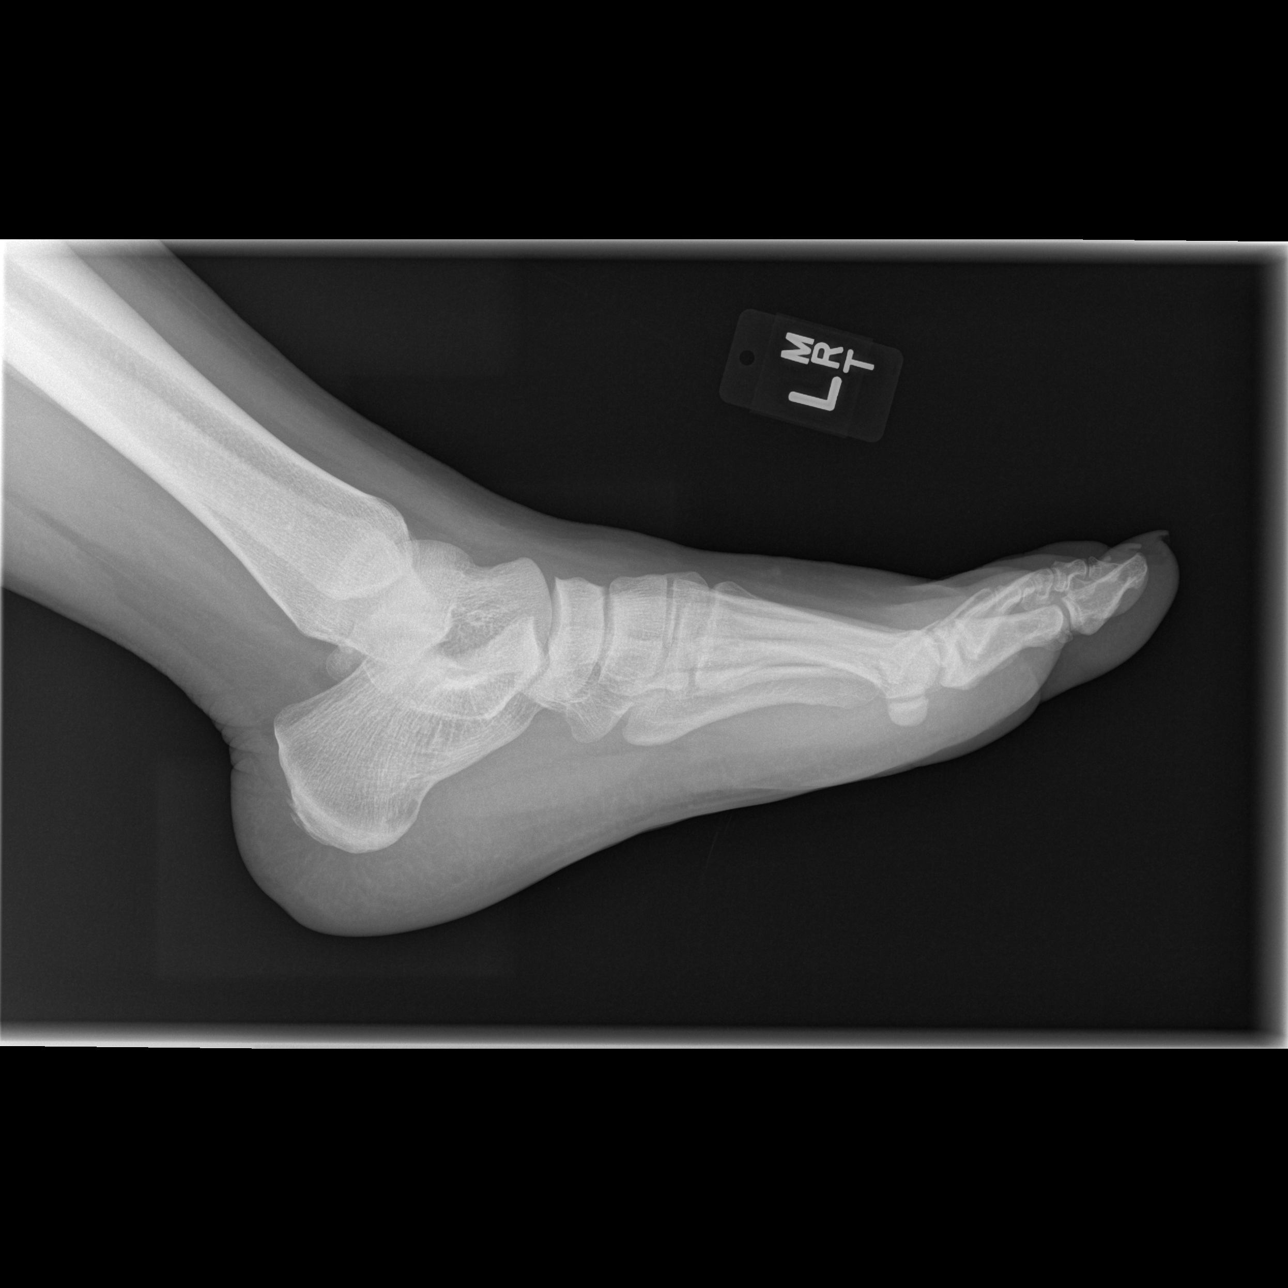

[3 of 3 positions shown; findings below may reference images not displayed]

FINDINGS: Midshaft convexity in the proximal phalanges of the third and fourth
toes is likely incidental or due to old deformity. No significant
associated periosteal reaction or other signs that this represents
stress fracture.

Lisfranc joint alignment unremarkable. First digit sesamoids normal.

Small Achilles calcaneal spur. The ankle is dorsal flexed during
imaging.
IMPRESSION: 1. No acute findings.

## 2016-03-04 ENCOUNTER — Telehealth: Payer: Self-pay | Admitting: *Deleted

## 2016-03-04 NOTE — Telephone Encounter (Signed)
Received message left on nurse voicemail on 03/04/16 at 1613.  [employer] nurse with [employer].  States she has done [place] with patient and her score remains between 13-14.  States patient's baby failed the hearing screen and has been diagnosed as deaf in one ear.  States she thinks this is really affecting patient.  States patient has her next appointment in our office on 03/17/16 at 10 am.  Wanted to be sure we were aware.  Requests a return call to [phone].

## 2016-03-06 NOTE — Telephone Encounter (Signed)
I attempted to call patient and there was no answer. I have also ask Jamie Clinical SS. To reach out to patient.

## 2016-03-10 NOTE — Telephone Encounter (Signed)
Pt scheduled to come in to see Jamie on 8/15.

## 2016-03-11 NOTE — Addendum Note (Signed)
Addendum  created 03/11/16 1224 by Lyn Hollingshead, MD   Anesthesia Staff edited

## 2016-03-17 ENCOUNTER — Ambulatory Visit: Payer: Self-pay

## 2016-03-17 NOTE — Addendum Note (Signed)
Addendum  created 03/17/16 1557 by Lyn Hollingshead, MD   Anesthesia Staff edited

## 2016-08-06 IMAGING — US US OB COMP LESS 14 WK
1 series · 13 of 28 positions shown · non-contrast
Comparison: June 21, 2014

CLINICAL DATA: Right lower quadrant pain with nausea, vomiting, and
cramping

EXAM:
OBSTETRIC <14 WK US AND TRANSVAGINAL OB US
TECHNIQUE: Both transabdominal and transvaginal ultrasound examinations were
performed for complete evaluation of the gestation as well as the
maternal uterus, adnexal regions, and pelvic cul-de-sac.
Transvaginal technique was performed to assess early pregnancy.

[Series 1: us ob comp less 14 wks · 105 acquisitions, 13 frames shown]
[im 4/105]
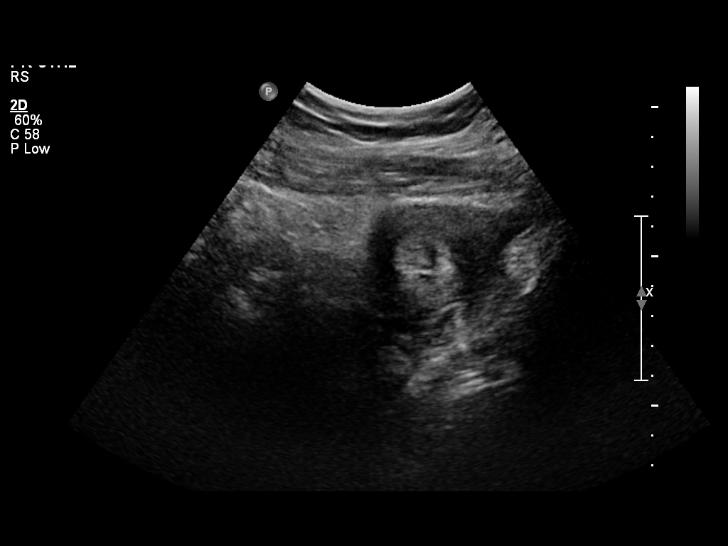
[im 12/105]
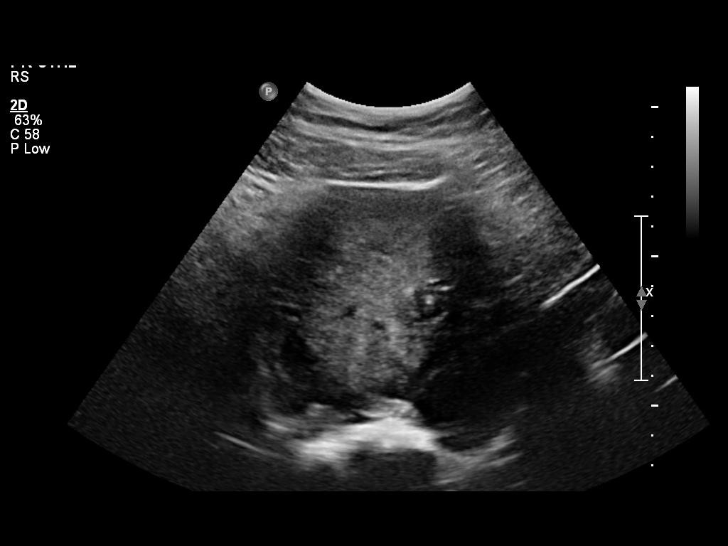
[im 20/105]
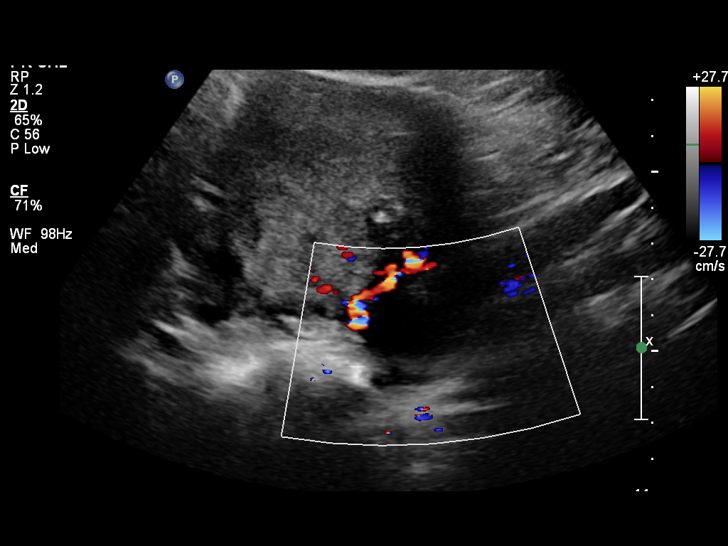
[im 27/105]
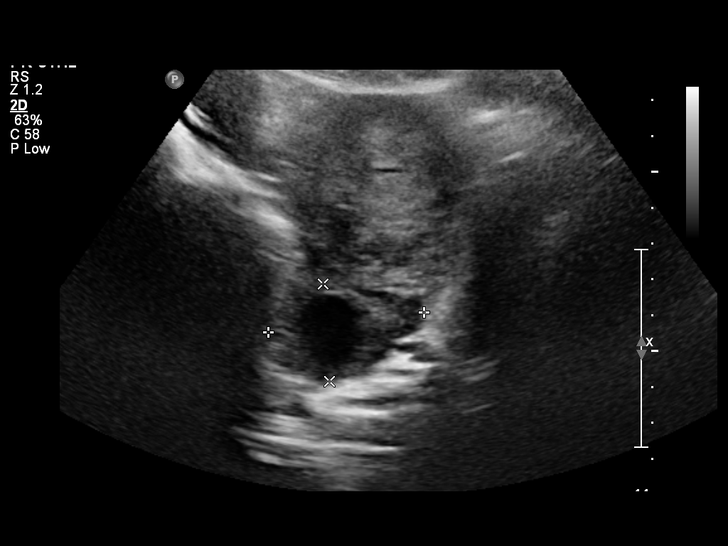
[im 35/105]
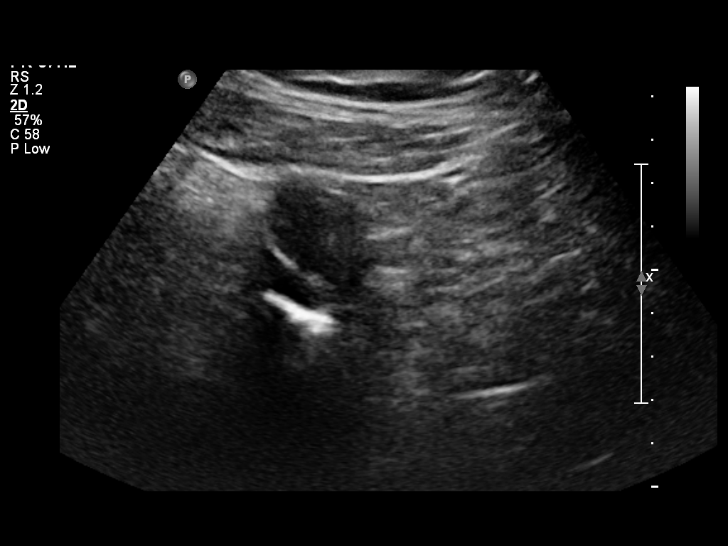
[im 43/105]
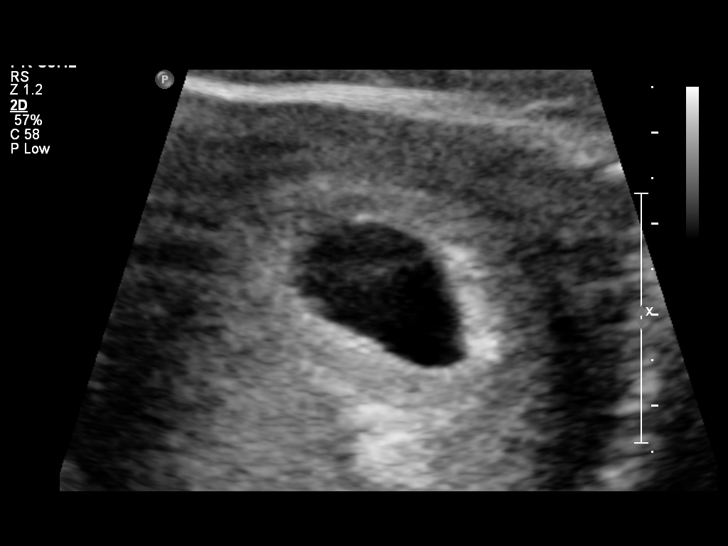
[im 54/105]
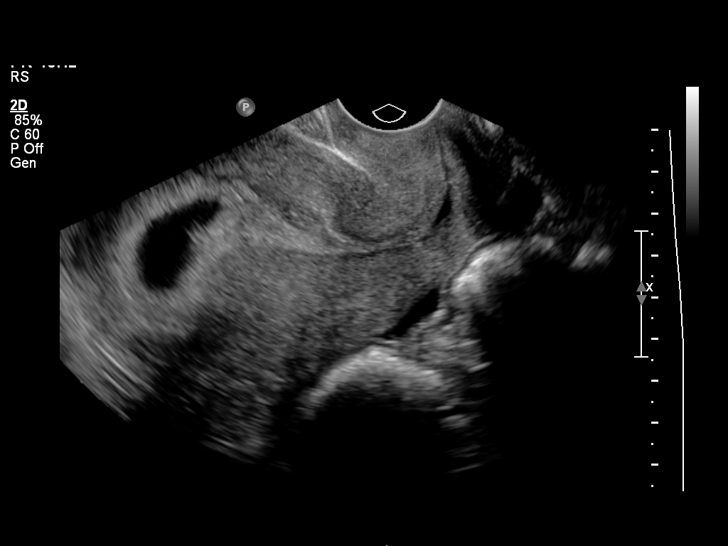
[im 62/105]
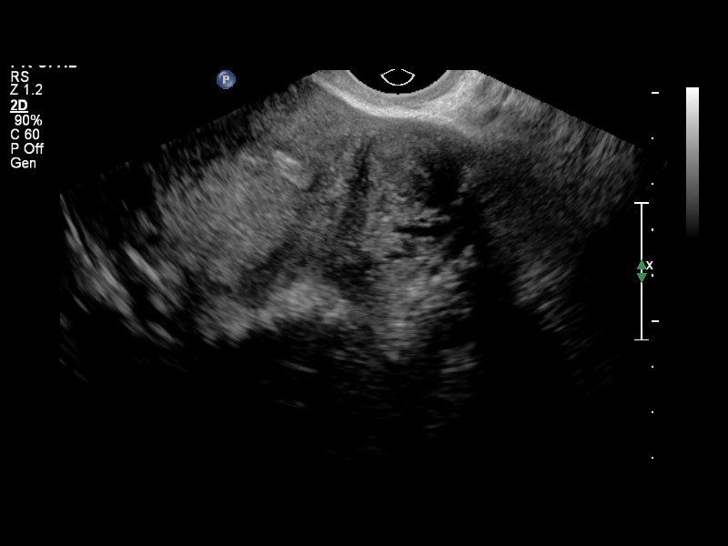
[im 70/105]
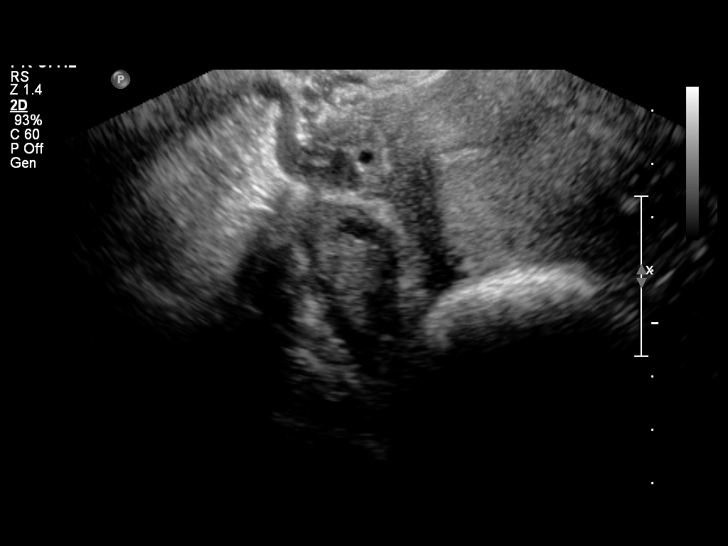
[im 78/105]
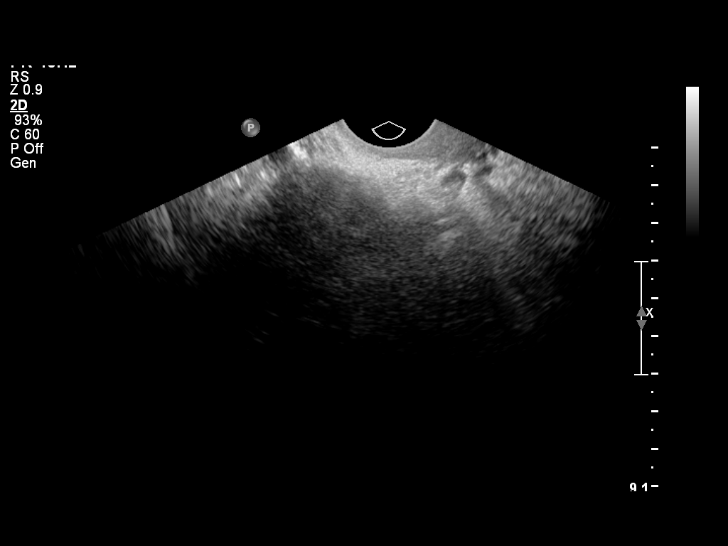
[im 85/105]
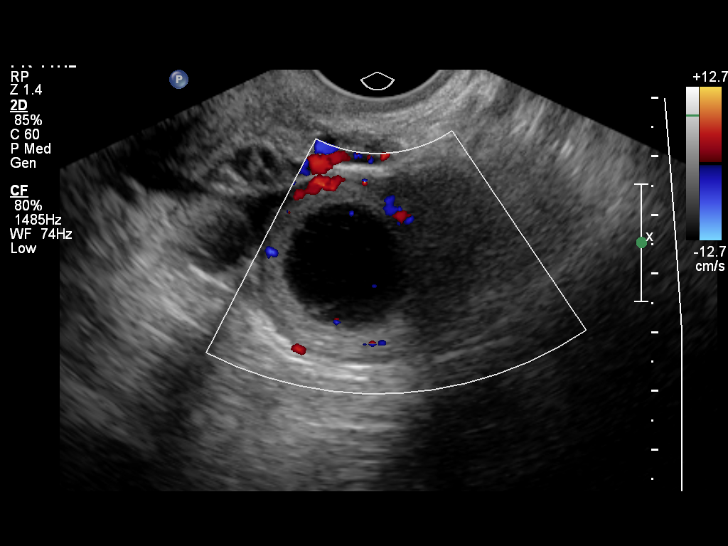
[im 93/105]
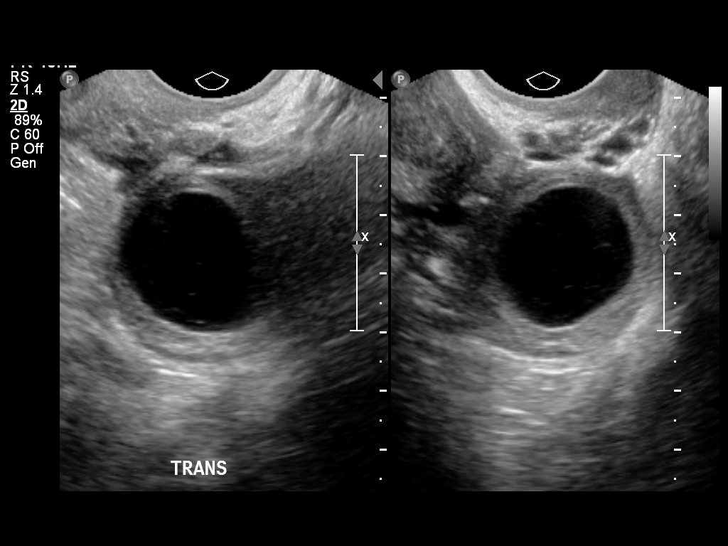
[im 101/105]
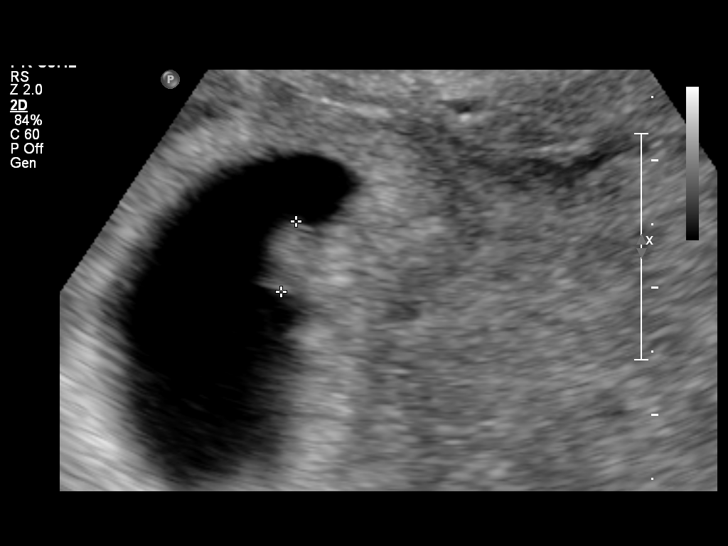

[13 of 28 positions shown; findings below may reference images not displayed]

FINDINGS: Intrauterine gestational sac: Visualized/normal in shape.

Yolk sac:  Visualized

Embryo:  Visualized

Cardiac Activity: Visualized

Heart Rate:  165 bpm

CRL:   6  mm   6 w 3 d                  US EDC: March 13, 2015

Maternal uterus/adnexae: There is an apparent pedunculated leiomyoma
arising from the right uterine fundus measuring 2.9 x 4.5 x 3.2 cm.
In the left side of the uterus, there is a calcified leiomyoma
measuring 3.2 x 2.0 x 1.8 cm. There is no evidence of subchorionic
hemorrhage. The cervical os is closed. The right ovary appears
normal. There is a probable corpus luteum in the left ovary
measuring 2.4 x 2.5 x 2.2 cm. There is trace free pelvic fluid.
IMPRESSION: Single live intrauterine gestation with estimated gestational age of
6+ weeks. Leiomyomatous uterus. Minimal free pelvic fluid is
probably physiologic.

## 2017-01-31 ENCOUNTER — Encounter (HOSPITAL_COMMUNITY): Payer: Self-pay | Admitting: Emergency Medicine

## 2017-01-31 ENCOUNTER — Emergency Department (HOSPITAL_COMMUNITY)
Admission: EM | Admit: 2017-01-31 | Discharge: 2017-01-31 | Disposition: A | Discharge status: Home / Self Care | Payer: Medicaid Other | Attending: Emergency Medicine | Admitting: Emergency Medicine

## 2017-01-31 DIAGNOSIS — Z87891 Personal history of nicotine dependence: Secondary | ICD-10-CM | POA: Insufficient documentation

## 2017-01-31 DIAGNOSIS — J45909 Unspecified asthma, uncomplicated: Secondary | ICD-10-CM | POA: Insufficient documentation

## 2017-01-31 DIAGNOSIS — M25512 Pain in left shoulder: Secondary | ICD-10-CM | POA: Insufficient documentation

## 2017-01-31 DIAGNOSIS — I10 Essential (primary) hypertension: Secondary | ICD-10-CM | POA: Insufficient documentation

## 2017-01-31 MED ORDER — PREDNISONE 10 MG PO TABS: 20.0000 mg | ORAL_TABLET | Freq: Every day | ORAL | 0 refills | Status: DC

## 2017-01-31 MED ORDER — RANITIDINE HCL 150 MG PO CAPS: 150.0000 mg | ORAL_CAPSULE | Freq: Every day | ORAL | 0 refills | Status: DC

## 2017-01-31 MED ORDER — IBUPROFEN 600 MG PO TABS: 600.0000 mg | ORAL_TABLET | Freq: Four times a day (QID) | ORAL | 0 refills | Status: DC | PRN

## 2017-01-31 NOTE — Discharge Instructions (Signed)
Take prednisone as prescribed. You may take ibuprofen as needed for pain. Take this with meals. Take Zantac when you are taking to prescription together, as this can cause stomach upset. Follow-up with your primary care in a week if symptoms have not improved. You may also follow up with [facility] if symptoms continue. Return to the emergency department if your pain worsens significantly, or you have any new or worsening symptoms.

## 2017-01-31 NOTE — ED Provider Notes (Signed)
[facility] DEPT Provider Note   CSN: [id number] Arrival date & time: 01/31/17  3151  By signing my name below, I, Ephriam Jenkins, attest that this documentation has been prepared under the direction and in the presence of Prentiss Polio PA-C.  Electronically Signed: Ephriam Jenkins, ED Scribe. 01/31/17. 10:56 AM.  History   Chief Complaint Chief Complaint  Patient presents with  . Neck Pain  . Shoulder Pain    HPI HPI Comments: [name] is a 31 y.o. female who presents to the Emergency Department complaining of gradually worsening left sided neck/shoulder pain that radiates down the left side of her back, onset two weeks ago. Pt states that she first noticed the pain when it woke her up from her sleep two weeks ago. Seven days ago, pt was seen at a [facility] last week for same symptoms and was prescribed a muscle relaxer which she has taken with no relief. Pt has three young children and states that she frequently has to lift them, which she believes may have caused her symptoms. She further reports an exacerbation of pain when moving her left arm and moving her head quickly. Pt describes the pain as a "soreness" in her neck and upper back. She has taken Tylenol with no relief. Pt has not applied ice or heat to the area. No changes in vision, or speech fluency. No loss of sensation. No fever. She denies any loss of bowel or bladder control.   The history is provided by the patient. No language interpreter was used.    Past Medical History:  Diagnosis Date  . Asthma   . 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 approx 3-4cm at time of 01/2016 c-section  . Herpes   . Hypertension   . Trichomonas vaginitis     Patient Active Problem List   Diagnosis Date Noted  . S/P primary low transverse C-section 01/08/2016  . Cord prolapse 01/08/2016  . Asthma, mild intermittent 08/14/2015  . Abnormal quad screen 08/11/2015  . Anemia affecting pregnancy, antepartum  08/11/2015  . Sickle cell trait ([place]) 09/01/2014  . Morbid (severe) obesity due to excess calories ([place]) 04/28/2012    Past Surgical History:  Procedure Laterality Date  . CESAREAN SECTION N/A 01/08/2016   Procedure: CESAREAN SECTION;  Surgeon: Aletha Halim, MD;  Location: [place];  Service: Obstetrics;  Laterality: N/A;  . DILATION AND CURETTAGE OF UTERUS      OB History    Gravida Para Term Preterm AB Living   4 3 3   1 3    SAB TAB Ectopic Multiple Live Births   1     0 3      Home Medications    Prior to Admission medications   Medication Sig Start Date End Date Taking? Authorizing Provider  albuterol (PROVENTIL HFA;VENTOLIN HFA) 108 (90 BASE) MCG/ACT inhaler Inhale 2 puffs into the lungs every 6 (six) hours as needed for wheezing or shortness of breath. 01/03/15   Gwen Pounds, CNM  ibuprofen (ADVIL,MOTRIN) 600 MG tablet Take 1 tablet (600 mg total) by mouth every 6 (six) hours as needed. 01/31/17   Collins Dimaria, PA-C  oxyCODONE-acetaminophen (PERCOCET/ROXICET) 5-325 MG tablet Take 1-2 tablets by mouth every 4 (four) hours as needed (pain scale 4-7). Patient not taking: Reported on 02/18/2016 02/03/16   Elvera Maria, CNM  predniSONE (DELTASONE) 10 MG tablet Take 2 tablets (20 mg total) by mouth daily. 01/31/17   Tomeshia Pizzi, PA-C  ranitidine (ZANTAC)  150 MG capsule Take 1 capsule (150 mg total) by mouth daily. 01/31/17   Aasha Dina, PA-C   Family History Family History  Problem Relation Age of Onset  . Diabetes Mother   . Hypertension Mother   . Hyperlipidemia Mother   . Heart disease Mother   . Diabetes Sister   . Diabetes Brother     Social History Social History  Substance Use Topics  . Smoking status: Former Smoker    Types: Cigarettes  . Smokeless tobacco: Never Used     Comment: quit 2013  . Alcohol use No     Comment: occasional, not with preg   Allergies   Patient has no known allergies.   Review of Systems Review  of Systems  Constitutional: Negative for fever.  Eyes: Negative for visual disturbance.  Musculoskeletal: Positive for back pain (left sided), myalgias (Left side of neck/shoulder) and neck pain (Left).  Neurological: Negative for speech difficulty and numbness.   Physical Exam Updated Vital Signs BP 133/77 (BP Location: Right Arm)   Pulse (!) 105   Temp 98 F (36.7 C) (Oral)   Resp 16   Wt 128.6 kg (283 lb 9 oz)   SpO2 100%   BMI 43.12 kg/m   Physical Exam  Constitutional: She is oriented to person, place, and time. She appears well-developed and well-nourished. No distress.  HENT:  Head: Normocephalic and atraumatic.  Neck: Normal range of motion.  Pulmonary/Chest: Effort normal.  Musculoskeletal: Normal range of motion. She exhibits tenderness. She exhibits no deformity.  Pt with full ROM of her neck. No increased TTP over spinous process. Increased TTP to the left neck musculature. Full ROM of upper extremities with reported pain in the left upper extremity. Color and warmth equal bilaterally. TTP of the musculature surrounding the left scapula. No tenderness over bony process. No TTP of the arm, elbow, wrist or hand. No swelling injuries or contusions noted on the back or shoulder.   Neurological: She is alert and oriented to person, place, and time. No sensory deficit.  Strength intact bilaterally. Sensation intact bilaterally.   Skin: Skin is warm and dry. She is not diaphoretic. No pallor.  Psychiatric: She has a normal mood and affect. Judgment normal.  Nursing note and vitals reviewed.  ED Treatments / Results  DIAGNOSTIC STUDIES: Oxygen Saturation is 100% on RA, normal by my interpretation.  COORDINATION OF CARE: 10:56 AM-Discussed treatment plan with pt at bedside and pt agreed to plan.   Labs (all labs ordered are listed, but only abnormal results are displayed) Labs Reviewed - No data to display  EKG  EKG Interpretation None       Radiology No results  found.  Procedures Procedures (including critical care time)  Medications Ordered in ED Medications - No data to display   Initial Impression / Assessment and Plan / ED Course  I have reviewed the triage vital signs and the nursing notes.  Pertinent labs & imaging results that were available during my care of the patient were reviewed by me and considered in my medical decision making (see chart for details).     Pt presenting with neck/shoulder pain radiating down the L side of her back. Worse with movement and palpation. Pt without relief from muscle relaxant. Pt with likely MSK etiology with possible pinched nerve. Will dc pt with high dose NSAIDs, prednisone, and zantac for stomach sxs. Pt to f/u with PCP or neurology if sxs not improving. Discussed findings with pt. Return  precautions given. Pt states she understands and agrees to plan.    Final Clinical Impressions(s) / ED Diagnoses   Final diagnoses:  Acute pain of left shoulder    New Prescriptions Discharge Medication List as of 01/31/2017 11:00 AM    START taking these medications   Details  predniSONE (DELTASONE) 10 MG tablet Take 2 tablets (20 mg total) by mouth daily., Starting Sun 01/31/2017, Print    ranitidine (ZANTAC) 150 MG capsule Take 1 capsule (150 mg total) by mouth daily., Starting Sun 01/31/2017, Print      I personally performed the services described in this documentation, which was scribed in my presence. The recorded information has been reviewed and is accurate.    Franchot Heidelberg, PA-C 02/02/17 5537    Dorie Rank, MD 02/03/17 [phone]

## 2017-01-31 NOTE — ED Triage Notes (Signed)
Patient c/o left sided neck/shoulder pain that radiates down to left shoulder blade x 2 weeks. 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  Patient went to [facility] and was given muscle relaxers without any relief.

## 2017-01-31 NOTE — ED Notes (Signed)
Bed: WTR6 Expected date:  Expected time:  Means of arrival:  Comments: 

## 2017-01-31 NOTE — ED Notes (Signed)
Bed: WTR7 Expected date:  Expected time:  Means of arrival:  Comments: 

## 2017-02-06 ENCOUNTER — Emergency Department (HOSPITAL_COMMUNITY): Payer: Self-pay

## 2017-02-06 ENCOUNTER — Emergency Department (HOSPITAL_COMMUNITY)
Admission: EM | Admit: 2017-02-06 | Discharge: 2017-02-07 | Disposition: A | Discharge status: Home / Self Care | Payer: Self-pay | Attending: Emergency Medicine | Admitting: Emergency Medicine

## 2017-02-06 ENCOUNTER — Other Ambulatory Visit: Payer: Self-pay

## 2017-02-06 ENCOUNTER — Encounter: Payer: Self-pay | Admitting: Emergency Medicine

## 2017-02-06 DIAGNOSIS — J4520 Mild intermittent asthma, uncomplicated: Secondary | ICD-10-CM | POA: Insufficient documentation

## 2017-02-06 DIAGNOSIS — M542 Cervicalgia: Secondary | ICD-10-CM | POA: Insufficient documentation

## 2017-02-06 DIAGNOSIS — Z87891 Personal history of nicotine dependence: Secondary | ICD-10-CM | POA: Insufficient documentation

## 2017-02-06 DIAGNOSIS — R202 Paresthesia of skin: Secondary | ICD-10-CM | POA: Insufficient documentation

## 2017-02-06 DIAGNOSIS — I10 Essential (primary) hypertension: Secondary | ICD-10-CM | POA: Insufficient documentation

## 2017-02-06 LAB — BASIC METABOLIC PANEL
Anion gap: 7 (ref 5–15)
BUN: 9 mg/dL (ref 6–20)
CO2: 29 mmol/L (ref 22–32)
Calcium: 9 mg/dL (ref 8.9–10.3)
Chloride: 103 mmol/L (ref 101–111)
Creatinine, Ser: 0.85 mg/dL (ref 0.44–1.00)
GFR calc Af Amer: >60 mL/min (ref >60)
GFR calc non Af Amer: >60 mL/min (ref >60)
Glucose, Bld: 110 mg/dL — ABNORMAL HIGH (ref 65–99)
Potassium: 3.6 mmol/L (ref 3.5–5.1)
Sodium: 139 mmol/L (ref 135–145)

## 2017-02-06 LAB — CBC
HCT: 33.2 % — ABNORMAL LOW (ref 36.0–46.0)
Hemoglobin: 10.8 g/dL — ABNORMAL LOW (ref 12.0–15.0)
MCH: 22.6 pg — ABNORMAL LOW (ref 26.0–34.0)
MCHC: 32.5 g/dL (ref 30.0–36.0)
MCV: 69.5 fL — ABNORMAL LOW (ref 78.0–100.0)
Platelets: 348 10*3/uL (ref 150–400)
RBC: 4.78 MIL/uL (ref 3.87–5.11)
RDW: 16.3 % — ABNORMAL HIGH (ref 11.5–15.5)
WBC: 7.7 10*3/uL (ref 4.0–10.5)

## 2017-02-06 LAB — POCT I-STAT TROPONIN I: Troponin i, poc: 0 ng/mL (ref 0.00–0.08)

## 2017-02-06 NOTE — ED Triage Notes (Signed)
Patient states that she was seen a few days back and was told if she started tingling to come back. Patient states the tingling of the left arm started two days ago. She is also complaining of neck pain and shoulder pain.

## 2017-02-06 NOTE — ED Notes (Signed)
Bed: WA06 Expected date:  Expected time:  Means of arrival:  Comments: 

## 2017-02-07 ENCOUNTER — Emergency Department (HOSPITAL_COMMUNITY): Payer: Self-pay

## 2017-02-07 MED ORDER — OXYCODONE-ACETAMINOPHEN 5-325 MG PO TABS: 1.0000 | ORAL_TABLET | ORAL | 0 refills | Status: DC | PRN

## 2017-02-07 MED ORDER — METHOCARBAMOL 500 MG PO TABS
500.0000 mg | ORAL_TABLET | Freq: Once | ORAL | Status: AC
  Administered 2017-02-07: 500 mg via ORAL
  Filled 2017-02-07: qty 1

## 2017-02-07 MED ORDER — CYCLOBENZAPRINE HCL 10 MG PO TABS: 10.0000 mg | ORAL_TABLET | Freq: Two times a day (BID) | ORAL | 0 refills | Status: DC | PRN

## 2017-02-07 MED ORDER — OXYCODONE-ACETAMINOPHEN 5-325 MG PO TABS
1.0000 | ORAL_TABLET | Freq: Once | ORAL | Status: AC
  Administered 2017-02-07: 1 via ORAL
  Filled 2017-02-07: qty 1

## 2017-02-07 NOTE — Discharge Instructions (Signed)
Follow up with your doctor for further management of persistent neck and left arm pain. Take medications as prescribed.

## 2017-02-07 NOTE — ED Provider Notes (Signed)
[facility] DEPT Provider Note   CSN: [id number] Arrival date & time: 02/06/17  2049     History   Chief Complaint Chief Complaint  Patient presents with  . Numbness    HPI [name] is a 31 y.o. female.  Neck, shoulder and upper back pain x 3 weeks. Felt she slept on it wrong at the start of symptoms. Evaluated at [facility] and treated for spasm. Treatment without relief. Has tried neck support without relief. Tonight she is here for persistent symptoms and uncontrolled pain. No fever, nausea, headache. She reports numbness "pins and needles" in left arm. She perceives mild weakness in the left UE.    The history is provided by the patient. No language interpreter was used.    Past Medical History:  Diagnosis Date  . Asthma   . 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 approx 3-4cm at time of 01/2016 c-section  . Herpes   . Hypertension   . Trichomonas vaginitis     Patient Active Problem List   Diagnosis Date Noted  . S/P primary low transverse C-section 01/08/2016  . Cord prolapse 01/08/2016  . Asthma, mild intermittent 08/14/2015  . Abnormal quad screen 08/11/2015  . Anemia affecting pregnancy, antepartum 08/11/2015  . Sickle cell trait ([place]) 09/01/2014  . Morbid (severe) obesity due to excess calories ([place]) 04/28/2012    Past Surgical History:  Procedure Laterality Date  . CESAREAN SECTION N/A 01/08/2016   Procedure: CESAREAN SECTION;  Surgeon: Aletha Halim, MD;  Location: [facility];  Service: Obstetrics;  Laterality: N/A;  . DILATION AND CURETTAGE OF UTERUS      OB History    Gravida Para Term Preterm AB Living   4 3 3   1 3    SAB TAB Ectopic Multiple Live Births   1     0 3       Home Medications    Prior to Admission medications   Medication Sig Start Date End Date Taking? Authorizing Provider  albuterol (PROVENTIL HFA;VENTOLIN HFA) 108 (90 BASE) MCG/ACT inhaler Inhale 2 puffs into the lungs every 6 (six) hours  as needed for wheezing or shortness of breath. 01/03/15   Gwen Pounds, CNM  ibuprofen (ADVIL,MOTRIN) 600 MG tablet Take 1 tablet (600 mg total) by mouth every 6 (six) hours as needed. 01/31/17   Caccavale, Sophia, PA-C  oxyCODONE-acetaminophen (PERCOCET/ROXICET) 5-325 MG tablet Take 1-2 tablets by mouth every 4 (four) hours as needed (pain scale 4-7). Patient not taking: Reported on 02/18/2016 02/03/16   Elvera Maria, CNM  predniSONE (DELTASONE) 10 MG tablet Take 2 tablets (20 mg total) by mouth daily. 01/31/17   Caccavale, Sophia, PA-C  ranitidine (ZANTAC) 150 MG capsule Take 1 capsule (150 mg total) by mouth daily. 01/31/17   Caccavale, Sophia, PA-C    Family History Family History  Problem Relation Age of Onset  . Diabetes Mother   . Hypertension Mother   . Hyperlipidemia Mother   . Heart disease Mother   . Diabetes Sister   . Diabetes Brother     Social History Social History  Substance Use Topics  . Smoking status: Former Smoker    Types: Cigarettes  . Smokeless tobacco: Never Used     Comment: quit 2013  . Alcohol use No     Comment: occasional, not with preg     Allergies   Patient has no known allergies.   Review of Systems Review of Systems  Constitutional: Negative for  chills and fever.  Respiratory: Negative.  Negative for shortness of breath.   Cardiovascular: Negative.  Negative for chest pain.  Gastrointestinal: Negative.  Negative for nausea.  Musculoskeletal: Positive for neck pain.       See HPI.  Skin: Negative.  Negative for color change.  Neurological: Positive for weakness.       Tingling, LUE     Physical Exam Updated Vital Signs BP 129/83 (BP Location: Right Arm)   Pulse 80   Temp 98.1 F (36.7 C) (Oral)   Resp 18   Ht 5\' 8"  (1.727 m)   Wt 128.4 kg (283 lb)   LMP 01/15/2017   SpO2 100%   BMI 43.03 kg/m   Physical Exam  Constitutional: She is oriented to person, place, and time. She appears well-developed and well-nourished.    Neck: Normal range of motion.  Cardiovascular: Intact distal pulses.   Pulmonary/Chest: Effort normal.  Musculoskeletal:  No midline cervical tenderness. There is left lateral neck tenderness extending into trapezius muscle. No palpable spasm. Grip strength is equal in bilateral UE's.  Neurological: She is alert and oriented to person, place, and time. No sensory deficit. Coordination normal.  Skin: Skin is warm and dry.     ED Treatments / Results  Labs (all labs ordered are listed, but only abnormal results are displayed) Labs Reviewed  BASIC METABOLIC PANEL - Abnormal; Notable for the following:       Result Value   Glucose, Bld 110 (*)    All other components within normal limits  CBC - Abnormal; Notable for the following:    Hemoglobin 10.8 (*)    HCT 33.2 (*)    MCV 69.5 (*)    MCH 22.6 (*)    RDW 16.3 (*)    All other components within normal limits  I-STAT TROPOININ, ED  POCT I-STAT TROPONIN I    EKG  EKG Interpretation None       Radiology Dg Chest 2 View  Result Date: 02/06/2017 CLINICAL DATA:  LEFT arm tingling for 2 days, neck and shoulder pain. History of hypertension. EXAM: CHEST  2 VIEW COMPARISON:  None. FINDINGS: Cardiomediastinal silhouette is normal. No pleural effusions or focal consolidations. Trachea projects midline and there is no pneumothorax. Soft tissue planes and included osseous structures are non-suspicious. Large body habitus. IMPRESSION: Normal chest. Electronically Signed   By: Elon Alas M.D.   On: 02/06/2017 22:17    Procedures Procedures (including critical care time)  Medications Ordered in ED Medications - No data to display   Initial Impression / Assessment and Plan / ED Course  I have reviewed the triage vital signs and the nursing notes.  Pertinent labs & imaging results that were available during my care of the patient were reviewed by me and considered in my medical decision making (see chart for details).      Patient presents with lateral neck, shoulder and arm tingling, "pins and needles" sensation. No neurologic deficits on exam.   She can be discharged home with outpatient follow up with orthopedics for further evaluation of possible cervical radiculopathy.   Final Clinical Impressions(s) / ED Diagnoses   Final diagnoses:  None   1. Neck pain 2. paresthesias  New Prescriptions New Prescriptions   No medications on file     Dennie Bible 02/15/17 0252    Duffy Bruce, MD 02/15/17 0700

## 2017-02-07 NOTE — ED Notes (Signed)
Bed: WA11 Expected date:  Expected time:  Means of arrival:  Comments: 

## 2018-01-05 ENCOUNTER — Encounter (HOSPITAL_COMMUNITY): Payer: Self-pay | Admitting: Emergency Medicine

## 2018-01-05 ENCOUNTER — Emergency Department (HOSPITAL_COMMUNITY)
Admission: EM | Admit: 2018-01-05 | Discharge: 2018-01-05 | Disposition: A | Discharge status: Home / Self Care | Payer: 59 | Attending: Emergency Medicine | Admitting: Emergency Medicine

## 2018-01-05 DIAGNOSIS — Z5321 Procedure and treatment not carried out due to patient leaving prior to being seen by health care provider: Secondary | ICD-10-CM | POA: Insufficient documentation

## 2018-01-05 DIAGNOSIS — R51 Headache: Secondary | ICD-10-CM | POA: Diagnosis not present

## 2018-01-05 NOTE — ED Notes (Signed)
Patient called from the lobby with no response x3

## 2018-01-05 NOTE — ED Notes (Signed)
Pt called from the lobby with no response x1 

## 2018-01-05 NOTE — ED Triage Notes (Signed)
Patient here from home with complaints of headaches off and one x2 months. Currently has headaches. Nausea, no vomiting.

## 2018-01-05 NOTE — ED Notes (Signed)
Pt called from the lobby with no response x2 

## 2018-01-06 NOTE — ED Notes (Signed)
Follow up call made  No answer  01/06/18  1510  s Yared Susan rn

## 2018-01-10 ENCOUNTER — Other Ambulatory Visit: Payer: Self-pay

## 2018-01-10 ENCOUNTER — Encounter: Payer: Self-pay | Admitting: Family Medicine

## 2018-01-10 ENCOUNTER — Ambulatory Visit: Payer: 59 | Admitting: Family Medicine

## 2018-01-10 VITALS — BP 118/64 | HR 100 | Temp 98.5°F | Ht 68.0 in | Wt 285.0 lb

## 2018-01-10 DIAGNOSIS — Z131 Encounter for screening for diabetes mellitus: Secondary | ICD-10-CM

## 2018-01-10 DIAGNOSIS — Z833 Family history of diabetes mellitus: Secondary | ICD-10-CM | POA: Diagnosis not present

## 2018-01-10 DIAGNOSIS — G4489 Other headache syndrome: Secondary | ICD-10-CM

## 2018-01-10 DIAGNOSIS — E119 Type 2 diabetes mellitus without complications: Secondary | ICD-10-CM

## 2018-01-10 DIAGNOSIS — R202 Paresthesia of skin: Secondary | ICD-10-CM | POA: Diagnosis not present

## 2018-01-10 DIAGNOSIS — G4452 New daily persistent headache (NDPH): Secondary | ICD-10-CM | POA: Diagnosis not present

## 2018-01-10 DIAGNOSIS — H53413 Scotoma involving central area, bilateral: Secondary | ICD-10-CM | POA: Insufficient documentation

## 2018-01-10 DIAGNOSIS — D259 Leiomyoma of uterus, unspecified: Secondary | ICD-10-CM

## 2018-01-10 DIAGNOSIS — O3410 Maternal care for benign tumor of corpus uteri, unspecified trimester: Secondary | ICD-10-CM | POA: Insufficient documentation

## 2018-01-10 HISTORY — DX: Type 2 diabetes mellitus without complications: E11.9

## 2018-01-10 HISTORY — DX: Family history of diabetes mellitus: Z83.3

## 2018-01-10 LAB — POCT GLYCOSYLATED HEMOGLOBIN (HGB A1C): Hemoglobin A1C: 7.5 % — AB (ref 4.0–5.6)

## 2018-01-10 MED ORDER — METFORMIN HCL 500 MG PO TABS: 500.0000 mg | ORAL_TABLET | Freq: Every day | ORAL | 0 refills | Status: DC

## 2018-01-10 MED ORDER — GABAPENTIN 300 MG PO CAPS: 300.0000 mg | ORAL_CAPSULE | Freq: Every day | ORAL | 0 refills | Status: DC

## 2018-01-10 NOTE — Assessment & Plan Note (Signed)
Will check hgb, b12, thyroid and start gabapentin Pt was screening for diabetes which was positive and most likely cause

## 2018-01-10 NOTE — Assessment & Plan Note (Signed)
Historically caused anemia but since starting nexplanon 2 yrs ago there is less risk of excessive bleeding
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
## 2018-01-10 NOTE — Patient Instructions (Addendum)
Diabetes Mellitus and Standards of Medical Care Managing diabetes (diabetes mellitus) can be complicated. Your diabetes treatment may be managed by a team of health care providers, including:  A diet and nutrition specialist (registered dietitian).  A nurse.  A certified diabetes educator (CDE).  A diabetes specialist (endocrinologist).  An eye doctor.  A primary care provider.  A dentist.  Your health care providers follow a schedule in order to help you get the best quality of care. The following schedule is a general guideline for your diabetes management plan. Your health care providers may also give you more specific instructions. HbA1c ( hemoglobin A1c) test This test provides information about blood sugar (glucose) control over the previous 2-3 months. It is used to check whether your diabetes management plan needs to be adjusted.  If you are meeting your treatment goals, this test is done at least 2 times a year.  If you are not meeting treatment goals or if your treatment goals have changed, this test is done 4 times a year.  Blood pressure test  This test is done at every routine medical visit. For most people, the goal is less than 130/80. Ask your health care provider what your goal blood pressure should be. Dental and eye exams  Visit your dentist two times a year.  If you have type 1 diabetes, get an eye exam 3-5 years after you are diagnosed, and then once a year after your first exam. ? If you were diagnosed with type 1 diabetes as a child, get an eye exam when you are age 10 or older and have had diabetes for 3-5 years. After the first exam, you should get an eye exam once a year.  If you have type 2 diabetes, have an eye exam as soon as you are diagnosed, and then once a year after your first exam. Foot care exam  Visual foot exams are done at every routine medical visit. The exams check for cuts, bruises, redness, blisters, sores, or other problems with the  feet.  A complete foot exam is done by your health care provider once a year. This exam includes an inspection of the structure and skin of your feet, and a check of the pulses and sensation in your feet. ? Type 1 diabetes: Get your first exam 3-5 years after diagnosis. ? Type 2 diabetes: Get your first exam as soon as you are diagnosed.  Check your feet every day for cuts, bruises, redness, blisters, or sores. If you have any of these or other problems that are not healing, contact your health care provider. Kidney function test ( urine microalbumin)  This test is done once a year. ? Type 1 diabetes: Get your first test 5 years after diagnosis. ? Type 2 diabetes: Get your first test as soon as you are diagnosed.  If you have chronic kidney disease (CKD), get a serum creatinine and estimated glomerular filtration rate (eGFR) test once a year. Lipid profile (cholesterol, HDL, LDL, triglycerides)  This test should be done when you are diagnosed with diabetes, and every 5 years after the first test. If you are on medicines to lower your cholesterol, you may need to get this test done every year. ? The goal for LDL is less than 100 mg/dL (5.5 mmol/L). If you are at high risk, the goal is less than 70 mg/dL (3.9 mmol/L). ? The goal for HDL is 40 mg/dL (2.2 mmol/L) for men and 50 mg/dL(2.8 mmol/L) for women. An HDL   cholesterol of 60 mg/dL (3.3 mmol/L) or higher gives some protection against heart disease. ? The goal for triglycerides is less than 150 mg/dL (8.3 mmol/L). Immunizations  The yearly flu (influenza) vaccine is recommended for everyone 6 months or older who has diabetes.  The pneumonia (pneumococcal) vaccine is recommended for everyone 2 years or older who has diabetes. If you are 65 or older, you may get the pneumonia vaccine as a series of two separate shots.  The hepatitis B vaccine is recommended for adults shortly after they have been diagnosed with diabetes.  The Tdap  (tetanus, diphtheria, and pertussis) vaccine should be given: ? According to normal childhood vaccination schedules, for children. ? Every 10 years, for adults who have diabetes.  The shingles vaccine is recommended for people who have had chicken pox and are 50 years or older. Mental and emotional health  Screening for symptoms of eating disorders, anxiety, and depression is recommended at the time of diagnosis and afterward as needed. If your screening shows that you have symptoms (you have a positive screening result), you may need further evaluation and be referred to a mental health care provider. Diabetes self-management education  Education about how to manage your diabetes is recommended at diagnosis and ongoing as needed. Treatment plan  Your treatment plan will be reviewed at every medical visit. Summary  Managing diabetes (diabetes mellitus) can be complicated. Your diabetes treatment may be managed by a team of health care providers.  Your health care providers follow a schedule in order to help you get the best quality of care.  Standards of care including having regular physical exams, blood tests, blood pressure monitoring, immunizations, screening tests, and education about how to manage your diabetes.  Your health care providers may also give you more specific instructions based on your individual health. This information is not intended to replace advice given to you by your health care provider. Make sure you discuss any questions you have with your health care provider. Document Released: 05/17/2009 Document Revised: 04/17/2016 Document Reviewed: 04/17/2016 Elsevier Interactive Patient Education  2018 [employer] Inc[employer]  

## 2018-01-10 NOTE — Assessment & Plan Note (Signed)
Pt screened based on paresthesias, headaches, family history and morbid obesity.  A1c 7.5% Started Metformin daily with breakfast Normal foot exam Gabapentin started at once daily dose at bedtime

## 2018-01-10 NOTE — Assessment & Plan Note (Signed)
Will image head with CT  Referral placed for Ophthalmology

## 2018-01-10 NOTE — Assessment & Plan Note (Signed)
Will send for CT and to Ophthalmology for dilated eye exam  Start gabapentin to help with headache symptoms and paresthesias

## 2018-01-11 LAB — COMPREHENSIVE METABOLIC PANEL
ALT: 11 IU/L (ref 0–32)
AST: 17 IU/L (ref 0–40)
Albumin/Globulin Ratio: 1.6 (ref 1.2–2.2)
Albumin: 4.4 g/dL (ref 3.5–5.5)
Alkaline Phosphatase: 108 IU/L (ref 39–117)
BUN/Creatinine Ratio: 8 — ABNORMAL LOW (ref 9–23)
BUN: 7 mg/dL (ref 6–20)
Bilirubin Total: 0.4 mg/dL (ref 0.0–1.2)
CO2: 23 mmol/L (ref 20–29)
Calcium: 10.1 mg/dL (ref 8.7–10.2)
Chloride: 100 mmol/L (ref 96–106)
Creatinine, Ser: 0.93 mg/dL (ref 0.57–1.00)
GFR calc Af Amer: 95 mL/min/{1.73_m2} (ref >59)
GFR calc non Af Amer: 83 mL/min/{1.73_m2} (ref >59)
Globulin, Total: 2.7 g/dL (ref 1.5–4.5)
Glucose: 203 mg/dL — ABNORMAL HIGH (ref 65–99)
Potassium: 4.2 mmol/L (ref 3.5–5.2)
Sodium: 137 mmol/L (ref 134–144)
Total Protein: 7.1 g/dL (ref 6.0–8.5)

## 2018-01-11 LAB — TSH: TSH: 2.13 u[IU]/mL (ref 0.450–4.500)

## 2018-01-11 LAB — CBC
Hematocrit: 38.4 % (ref 34.0–46.6)
Hemoglobin: 12.6 g/dL (ref 11.1–15.9)
MCH: 25.6 pg — ABNORMAL LOW (ref 26.6–33.0)
MCHC: 32.8 g/dL (ref 31.5–35.7)
MCV: 78 fL — ABNORMAL LOW (ref 79–97)
Platelets: 317 10*3/uL (ref 150–450)
RBC: 4.93 x10E6/uL (ref 3.77–5.28)
RDW: 15.7 % — ABNORMAL HIGH (ref 12.3–15.4)
WBC: 5.9 10*3/uL (ref 3.4–10.8)

## 2018-01-11 LAB — VITAMIN B12: Vitamin B-12: 319 pg/mL (ref 232–1245)

## 2018-01-16 NOTE — Progress Notes (Signed)
Chief Complaint  Patient presents with  . Diabetes    HPI   Diabetes Mellitus Type 2 : New Discussion Current treatment for diabetes includes: Metformin (started 01/10/18) Medication side effects? No   Known diabetic complications: peripheral neuropathy  Cardiovascular risk factors: diabetes mellitus and obesity (BMI >= 30 kg/m2) Current symptoms/problems include headaches, limping due to swelling of feet and have been unchanged.   For breakfast she had a bowl with egg, cheese and bacon Today for lunch pt had a spicy chicken patty  For dinner she plans to have some kind of mac and cheese or ramen noodles  Current exercise: none. Days per week? 0 Home blood sugar records: none, no supplies Any episodes of hypoglycemia? No  Most recent labs: HbA1c:  Lab Results  Component Value Date   HGBA1C 7.5 (A) 01/10/2018   Creatinine:  Lab Results  Component Value Date   CREATININE 0.93 01/10/2018    Major Depression She reports that when she was bouncing around from place to place with family or different people as a teenage she had depression She has been in therapy before  She reports that she was going through [employer] for counseling and her Medicare was placed on pause. She states that she has not been able to get back in with [facility]  Depression screen [facility] 2/9 01/17/2018 01/10/2018 01/10/2018 02/18/2016  Decreased Interest 3 1 0 1  Down, Depressed, Hopeless 3 1 0 2  PHQ - 2 Score 6 2 0 3  Altered sleeping 3 3 - 3  Tired, decreased energy 3 3 - 2  Change in appetite 3 3 - 3  Feeling bad or failure about yourself  3 2 - 2  Trouble concentrating 3 3 - 2  Moving slowly or fidgety/restless 3 3 - 0  Suicidal thoughts 0 0 - 0  PHQ-9 Score 24 19 - 15  Difficult doing work/chores Very difficult Somewhat difficult - -    Headaches  She reports that her headache became a daily headache 3 months ago She states that she started the metformin as prescribed for her diabetes  but when she missed a dose of the metformin her headache felt worse. She states that she did not feel like she could do much of anything  She had to lay down to go to sleep to relieve the headache  The headache pain was like a sharp pain affecting the right side of her head more than the left side  She drinks 1-2 sodas a week, she does not drink tea, no coffee or energy drinks She does not do aspirin   Social History   Tobacco Use  Smoking Status Former Smoker  . Types: Cigarettes  Smokeless Tobacco Never Used  Tobacco Comment   quit 2013     4 review of systems  Past Medical History:  Diagnosis Date  . Asthma   . 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 approx 3-4cm at time of 01/2016 c-section  . Herpes   . Trichomonas vaginitis     Current Outpatient Medications  Medication Sig Dispense Refill  . gabapentin (NEURONTIN) 300 MG capsule Take 1 capsule (300 mg total) by mouth at bedtime. 90 capsule 0  . ibuprofen (ADVIL,MOTRIN) 600 MG tablet Take 1 tablet (600 mg total) by mouth every 6 (six) hours as needed. 30 tablet 0  . metFORMIN (GLUCOPHAGE) 500 MG tablet Take 1 tablet (500 mg total) by mouth daily with breakfast. 90 tablet 0  .  blood glucose meter kit and supplies KIT Dispense based on patient and insurance preference. Use up to four times daily as directed. (FOR ICD-10 E11.9). 1 each 0  . citalopram (CELEXA) 10 MG tablet Take 1 tablet (10 mg total) by mouth daily. 30 tablet 0  . Continuous Blood Gluc Receiver (FREESTYLE LIBRE 14 DAY READER) DEVI 1 application by Does not apply route 2 (two) times daily. 1 Device 1  . Continuous Blood Gluc Sensor (FREESTYLE LIBRE 14 DAY SENSOR) MISC 1 Device by Does not apply route every 14 (fourteen) days. 6 each 1  . glucose blood test strip Use to check blood glucose twice daily. Use as instructed 100 each 12  . Lancet Devices (LANCING DEVICE) MISC Use to check blood glucose twice daily.  E 11.9 1 each 1  . topiramate  (TOPAMAX) 25 MG tablet Take 1/2 tablet at bedtime for a week, by week 2 increase to 1 tablet at bedtime, by week 3 increase to 1 tablet twice a day. 30 tablet 0   No current facility-administered medications for this visit.     Allergies: No Known Allergies  Past Surgical History:  Procedure Laterality Date  . CESAREAN SECTION N/A 01/08/2016   Procedure: CESAREAN SECTION;  Surgeon: Aletha Halim, MD;  Location: [place];  Service: Obstetrics;  Laterality: N/A;  . DILATION AND CURETTAGE OF UTERUS      Social History   Socioeconomic History  . Marital status: Legally Separated    Spouse name: Not on file  . Number of children: Not on file  . Years of education: Not on file  . Highest education level: Not on file  Occupational History  . Not on file  Social Needs  . Financial resource strain: Not very hard  . Food insecurity:    Worry: Never true    Inability: Never true  . Transportation needs:    Medical: Yes    Non-medical: Yes  Tobacco Use  . Smoking status: Former Smoker    Types: Cigarettes  . Smokeless tobacco: Never Used  . Tobacco comment: quit 2013  Substance and Sexual Activity  . Alcohol use: No    Comment: occasional, not with preg  . Drug use: No  . Sexual activity: Yes    Birth control/protection: Implant    Comment: last had intercourse  12/05/15  Lifestyle  . Physical activity:    Days per week: 1 day    Minutes per session: 10 min  . Stress: Very much  Relationships  . Social connections:    Talks on phone: Three times a week    Gets together: Once a week    Attends religious service: Never    Active member of club or organization: No    Attends meetings of clubs or organizations: Never    Relationship status: Separated  Other Topics Concern  . Not on file  Social History Narrative  . Not on file    Family History  Problem Relation Age of Onset  . Diabetes Mother   . Hypertension Mother   . Hyperlipidemia Mother   . Heart  disease Mother   . Diabetes Sister   . Diabetes Brother      ROS Review of Systems See HPI Constitution: No fevers or chills No malaise No diaphoresis Skin: No rash or itching Eyes: no blurry vision, no double vision GU: no dysuria or hematuria Neuro: no dizziness, ++ headaches all others reviewed and negative   Objective: Vitals:   01/17/18  1503  BP: 124/82  Pulse: 96  Resp: 17  Temp: 99.5 F (37.5 C)  TempSrc: Oral  SpO2: 99%  Weight: 286 lb 3.2 oz (129.8 kg)  Height: 5' 8"  (1.727 m)    Physical Exam Physical Exam  Constitutional: She is oriented to person, place, and time. She appears well-developed and well-nourished.  HENT:  Head: Normocephalic and atraumatic.  Eyes: Conjunctivae and EOM are normal.  Cardiovascular: Normal rate, regular rhythm and normal heart sounds.   Pulmonary/Chest: Effort normal and breath sounds normal. No respiratory distress. She has no wheezes.  Abdominal: Normal appearance and bowel sounds are normal. There is no tenderness. There is no CVA tenderness.  Neurological: She is alert and oriented to person, place, and time.   Assessment and Plan [place] was seen today for diabetes.  Diagnoses and all orders for this visit:  Newly diagnosed diabetes [facility])- discussed patient education, nutrition, diet and exercise Tolerating metformin -     POCT glucose (manual entry) -     Ambulatory referral to diabetic education  Severe episode of recurrent major depressive disorder, without psychotic features ([place])-  Discussed starting Celexa Discussed exercise Referral placed for Psychiatry -     Ambulatory referral to Psychiatry -     citalopram (CELEXA) 10 MG tablet; Take 1 tablet (10 mg total) by mouth daily.  Daily headache- discussed trial of prophylactic dose of topamax -     topiramate (TOPAMAX) 25 MG tablet; Take 1/2 tablet at bedtime for a week, by week 2 increase to 1 tablet at bedtime, by week 3 increase to 1 tablet twice a  day.  Other orders -     glucose blood test strip; Use to check blood glucose twice daily. Use as instructed -     blood glucose meter kit and supplies KIT; Dispense based on patient and insurance preference. Use up to four times daily as directed. (FOR ICD-10 E11.9). -     Lancet Devices (LANCING DEVICE) MISC; Use to check blood glucose twice daily.  E 11.9 -     Continuous Blood Gluc Receiver (FREESTYLE LIBRE 14 DAY READER) DEVI; 1 application by Does not apply route 2 (two) times daily. -     Continuous Blood Gluc Sensor (FREESTYLE LIBRE 14 DAY SENSOR) MISC; 1 Device by Does not apply route every 14 (fourteen) days.     [place]

## 2018-01-17 ENCOUNTER — Encounter: Payer: Self-pay | Admitting: Family Medicine

## 2018-01-17 ENCOUNTER — Ambulatory Visit (INDEPENDENT_AMBULATORY_CARE_PROVIDER_SITE_OTHER): Payer: 59 | Admitting: Family Medicine

## 2018-01-17 ENCOUNTER — Other Ambulatory Visit: Payer: Self-pay

## 2018-01-17 VITALS — BP 124/82 | HR 96 | Temp 99.5°F | Resp 17 | Ht 68.0 in | Wt 286.2 lb

## 2018-01-17 DIAGNOSIS — E119 Type 2 diabetes mellitus without complications: Secondary | ICD-10-CM

## 2018-01-17 DIAGNOSIS — R51 Headache: Secondary | ICD-10-CM | POA: Diagnosis not present

## 2018-01-17 DIAGNOSIS — R519 Headache, unspecified: Secondary | ICD-10-CM

## 2018-01-17 DIAGNOSIS — F332 Major depressive disorder, recurrent severe without psychotic features: Secondary | ICD-10-CM | POA: Diagnosis not present

## 2018-01-17 LAB — GLUCOSE, POCT (MANUAL RESULT ENTRY): POC Glucose: 145 mg/dl — AB (ref 70–99)

## 2018-01-17 MED ORDER — FREESTYLE LIBRE 14 DAY SENSOR MISC: 1.0000 | 1 refills | Status: DC

## 2018-01-17 MED ORDER — FREESTYLE LIBRE 14 DAY READER DEVI: 1.0000 "application " | Freq: Two times a day (BID) | 1 refills | Status: DC

## 2018-01-17 MED ORDER — TOPIRAMATE 25 MG PO TABS: ORAL_TABLET | ORAL | 0 refills | Status: DC

## 2018-01-17 MED ORDER — LANCING DEVICE MISC: 1 refills | Status: DC

## 2018-01-17 MED ORDER — CITALOPRAM HYDROBROMIDE 10 MG PO TABS: 10.0000 mg | ORAL_TABLET | Freq: Every day | ORAL | 0 refills | Status: DC

## 2018-01-17 MED ORDER — BLOOD GLUCOSE MONITOR KIT: PACK | 0 refills | Status: DC

## 2018-01-17 MED ORDER — GLUCOSE BLOOD VI STRP: ORAL_STRIP | 12 refills | Status: DC

## 2018-01-17 NOTE — Patient Instructions (Addendum)
IF you received an x-ray today, you will receive an invoice from [facility]. Please contact [facility] at [phone] with questions or concerns regarding your invoice.   IF you received labwork today, you will receive an invoice from [facility]. Please contact [facility] at [phone] with questions or concerns regarding your invoice.   Our billing staff will not be able to assist you with questions regarding bills from these companies.  You will be contacted with the lab results as soon as they are available. The fastest way to get your results is to activate your My Chart account. Instructions are located on the last page of this paperwork. If you have not heard from [place] regarding the results in 2 weeks, please contact this office.    Topiramate tablets What is this medicine? TOPIRAMATE (toe PYRE a mate) is used to treat seizures in adults or children with epilepsy. It is also used for the prevention of migraine headaches. This medicine may be used for other purposes; ask your health care provider or pharmacist if you have questions. COMMON BRAND NAME(S): Topamax, Topiragen What should I tell my health care provider before I take this medicine? They need to know if you have any of these conditions: -bleeding disorders -cirrhosis of the liver or liver disease -diarrhea -glaucoma -kidney stones or kidney disease -low blood counts, like low white cell, platelet, or red cell counts -lung disease like asthma, obstructive pulmonary disease, emphysema -metabolic acidosis -on a ketogenic diet -schedule for surgery or a procedure -suicidal thoughts, plans, or attempt; a previous suicide attempt by you or a family member -an unusual or allergic reaction to topiramate, other medicines, foods, dyes, or preservatives -pregnant or trying to get pregnant -breast-feeding How should I use this medicine? Take this medicine by mouth with a glass of water. Follow the  directions on the prescription label. Do not crush or chew. You may take this medicine with meals. Take your medicine at regular intervals. Do not take it more often than directed. Talk to your pediatrician regarding the use of this medicine in children. Special care may be needed. While this drug may be prescribed for children as young as 41 years of age for selected conditions, precautions do apply. Overdosage: If you think you have taken too much of this medicine contact a poison control center or emergency room at once. NOTE: This medicine is only for you. Do not share this medicine with others. What if I miss a dose? If you miss a dose, take it as soon as you can. If your next dose is to be taken in less than 6 hours, then do not take the missed dose. Take the next dose at your regular time. Do not take double or extra doses. What may interact with this medicine? Do not take this medicine with any of the following medications: -probenecid This medicine may also interact with the following medications: -acetazolamide -alcohol -amitriptyline -aspirin and aspirin-like medicines -birth control pills -certain medicines for depression -certain medicines for seizures -certain medicines that treat or prevent blood clots like warfarin, enoxaparin, dalteparin, apixaban, dabigatran, and rivaroxaban -digoxin -hydrochlorothiazide -lithium -medicines for pain, sleep, or muscle relaxation -metformin -methazolamide -NSAIDS, medicines for pain and inflammation, like ibuprofen or naproxen -pioglitazone -risperidone This list may not describe all possible interactions. Give your health care provider a list of all the medicines, herbs, non-prescription drugs, or dietary supplements you use. Also tell them if you smoke, drink alcohol, or use illegal drugs. Some items  may interact with your medicine. What should I watch for while using this medicine? Visit your doctor or health care professional for  regular checks on your progress. Do not stop taking this medicine suddenly. This increases the risk of seizures if you are using this medicine to control epilepsy. Wear a medical identification bracelet or chain to say you have epilepsy or seizures, and carry a card that lists all your medicines. This medicine can decrease sweating and increase your body temperature. Watch for signs of deceased sweating or fever, especially in children. Avoid extreme heat, hot baths, and saunas. Be careful about exercising, especially in hot weather. Contact your health care provider right away if you notice a fever or decrease in sweating. You should drink plenty of fluids while taking this medicine. If you have had kidney stones in the past, this will help to reduce your chances of forming kidney stones. If you have stomach pain, with nausea or vomiting and yellowing of your eyes or skin, call your doctor immediately. You may get drowsy, dizzy, or have blurred vision. Do not drive, use machinery, or do anything that needs mental alertness until you know how this medicine affects you. To reduce dizziness, do not sit or stand up quickly, especially if you are an older patient. Alcohol can increase drowsiness and dizziness. Avoid alcoholic drinks. If you notice blurred vision, eye pain, or other eye problems, seek medical attention at once for an eye exam. The use of this medicine may increase the chance of suicidal thoughts or actions. Pay special attention to how you are responding while on this medicine. Any worsening of mood, or thoughts of suicide or dying should be reported to your health care professional right away. This medicine may increase the chance of developing metabolic acidosis. If left untreated, this can cause kidney stones, bone disease, or slowed growth in children. Symptoms include breathing fast, fatigue, loss of appetite, irregular heartbeat, or loss of consciousness. Call your doctor immediately if you  experience any of these side effects. Also, tell your doctor about any surgery you plan on having while taking this medicine since this may increase your risk for metabolic acidosis. Birth control pills may not work properly while you are taking this medicine. Talk to your doctor about using an extra method of birth control. Women who become pregnant while using this medicine may enroll in the [place] Pregnancy Registry by calling [phone]. This registry collects information about the safety of antiepileptic drug use during pregnancy. What side effects may I notice from receiving this medicine? Side effects that you should report to your doctor or health care professional as soon as possible: -allergic reactions like skin rash, itching or hives, swelling of the face, lips, or tongue -decreased sweating and/or rise in body temperature -depression -difficulty breathing, fast or irregular breathing patterns -difficulty speaking -difficulty walking or controlling muscle movements -hearing impairment -redness, blistering, peeling or loosening of the skin, including inside the mouth -tingling, pain or numbness in the hands or feet -unusual bleeding or bruising -unusually weak or tired -worsening of mood, thoughts or actions of suicide or dying Side effects that usually do not require medical attention (report to your doctor or health care professional if they continue or are bothersome): -altered taste -back pain, joint or muscle aches and pains -diarrhea, or constipation -headache -loss of appetite -nausea -stomach upset, indigestion -tremors This list may not describe all possible side effects. Call your doctor for medical advice about side effects.  You may report side effects to FDA at 1-800-FDA-1088. Where should I keep my medicine? Keep out of the reach of children. Store at room temperature between 15 and 30 degrees C (59 and 86 degrees F) in a tightly  closed container. Protect from moisture. Throw away any unused medicine after the expiration date. NOTE: This sheet is a summary. It may not cover all possible information. If you have questions about this medicine, talk to your doctor, pharmacist, or health care provider.  2018 Elsevier/Gold Standard (2013-07-24 23:17:57)

## 2018-01-20 ENCOUNTER — Telehealth: Payer: Self-pay | Admitting: Family Medicine

## 2018-01-20 NOTE — Telephone Encounter (Signed)
CT of head/brain w/o contrast has been approved and is valid from 01/14/18-02/28/18 Notification #: [id number]

## 2018-01-26 NOTE — Progress Notes (Signed)
Chief Complaint  Patient presents with  . Diabetes    follow up     HPI   Diabetes Mellitus: Patient presents for follow up of diabetes. Symptoms: none. Patient denies hyperglycemia, hypoglycemia , nausea, paresthesia of the feet, polydipsia, polyuria, visual disturbances, vomitting and weight loss.  Evaluation to date has been included: hemoglobin A1C.  Home sugars: patient does not check sugars. Treatment to date: Started metformin which has been too early to assess effectiveness.  Lab Results  Component Value Date   HGBA1C 7.5 (A) 02/02/2018    She is taking the metformin and is doing well She stats that she does not eat breakfast  She reports that she has missed some days of metformin if she skipped breakfast She states time she might skip the metformin for fear of dropping her sugars too low  Morbid Obesity Body mass index is 43.94 kg/m. Wt Readings from Last 3 Encounters:  02/02/18 289 lb (131.1 kg)  01/17/18 286 lb 3.2 oz (129.8 kg)  01/10/18 285 lb (129.3 kg)   She has not started an exercise program  She does not typically eat breakfast She does not have much of an appetite in the morning Her largest meal of the day is dinner She stopped sugary drinks  Major Depression Depression: Patient complains of depression. She was started on Celexa and reports that she is tolerating it well.   She has coparenting roles and reports that she gets some time to herself.  She complains of depressed mood and insomnia. She denies current suicidal and homicidal plan or intent.   She has an appointment set up with a therapist.   Depression screen [facility] 2/9 02/02/2018 01/17/2018 01/10/2018 01/10/2018 02/18/2016  Decreased Interest '3 3 1 '$ 0 1  Down, Depressed, Hopeless '3 3 1 '$ 0 2  PHQ - 2 Score '6 6 2 '$ 0 3  Altered sleeping '3 3 3 '$ - 3  Tired, decreased energy '2 3 3 '$ - 2  Change in appetite '3 3 3 '$ - 3  Feeling bad or failure about yourself  '3 3 2 '$ - 2  Trouble concentrating '3 3 3 '$ - 2  Moving  slowly or fidgety/restless '1 3 3 '$ - 0  Suicidal thoughts 0 0 0 - 0  PHQ-9 Score '21 24 19 '$ - 15  Difficult doing work/chores Extremely dIfficult Very difficult Somewhat difficult - -     Past Medical History:  Diagnosis Date  . Asthma   . 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 approx 3-4cm at time of 01/2016 c-section  . Herpes   . Trichomonas vaginitis     Current Outpatient Medications  Medication Sig Dispense Refill  . blood glucose meter kit and supplies KIT Dispense based on patient and insurance preference. Use up to four times daily as directed. (FOR ICD-10 E11.9). 1 each 0  . citalopram (CELEXA) 10 MG tablet Take 1 tablet (10 mg total) by mouth daily. 30 tablet 0  . Continuous Blood Gluc Receiver (FREESTYLE LIBRE 14 DAY READER) DEVI 1 application by Does not apply route 2 (two) times daily. 1 Device 1  . Continuous Blood Gluc Sensor (FREESTYLE LIBRE 14 DAY SENSOR) MISC 1 Device by Does not apply route every 14 (fourteen) days. 6 each 1  . gabapentin (NEURONTIN) 300 MG capsule Take 1 capsule (300 mg total) by mouth at bedtime. 90 capsule 0  . glucose blood test strip Use to check blood glucose twice daily. Use as instructed 100 each 12  .  ibuprofen (ADVIL,MOTRIN) 600 MG tablet Take 1 tablet (600 mg total) by mouth every 6 (six) hours as needed. 30 tablet 0  . Lancet Devices (LANCING DEVICE) MISC Use to check blood glucose twice daily.  E 11.9 1 each 1  . metFORMIN (GLUCOPHAGE) 500 MG tablet Take 1 tablet (500 mg total) by mouth daily with breakfast. 90 tablet 0  . topiramate (TOPAMAX) 25 MG tablet Take 1/2 tablet at bedtime for a week, by week 2 increase to 1 tablet at bedtime, by week 3 increase to 1 tablet twice a day. 30 tablet 0   No current facility-administered medications for this visit.     Allergies: No Known Allergies  Past Surgical History:  Procedure Laterality Date  . CESAREAN SECTION N/A 01/08/2016   Procedure: CESAREAN SECTION;  Surgeon: Aletha Halim, MD;  Location: [place];  Service: Obstetrics;  Laterality: N/A;  . DILATION AND CURETTAGE OF UTERUS      Social History   Socioeconomic History  . Marital status: Legally Separated    Spouse name: Not on file  . Number of children: Not on file  . Years of education: Not on file  . Highest education level: Not on file  Occupational History  . Not on file  Social Needs  . Financial resource strain: Not very hard  . Food insecurity:    Worry: Never true    Inability: Never true  . Transportation needs:    Medical: Yes    Non-medical: Yes  Tobacco Use  . Smoking status: Former Smoker    Types: Cigarettes  . Smokeless tobacco: Never Used  . Tobacco comment: quit 2013  Substance and Sexual Activity  . Alcohol use: No    Comment: occasional, not with preg  . Drug use: No  . Sexual activity: Yes    Birth control/protection: Implant    Comment: last had intercourse  12/05/15  Lifestyle  . Physical activity:    Days per week: 1 day    Minutes per session: 10 min  . Stress: Very much  Relationships  . Social connections:    Talks on phone: Three times a week    Gets together: Once a week    Attends religious service: Never    Active member of club or organization: No    Attends meetings of clubs or organizations: Never    Relationship status: Separated  Other Topics Concern  . Not on file  Social History Narrative  . Not on file    Family History  Problem Relation Age of Onset  . Diabetes Mother   . Hypertension Mother   . Hyperlipidemia Mother   . Heart disease Mother   . Diabetes Sister   . Diabetes Brother      ROS Review of Systems See HPI Constitution: No fevers or chills No malaise No diaphoresis Skin: No rash or itching Eyes: no blurry vision, no double vision GU: no dysuria or hematuria Neuro: no dizziness or headaches  all others reviewed and negative   Objective: Vitals:   02/02/18 1333  BP: 128/72  Pulse: 100  Resp: 16   Temp: 98.8 F (37.1 C)  SpO2: 100%  Weight: 289 lb (131.1 kg)  Height: '5\' 8"'$  (1.727 m)    Physical Exam  Constitutional: She is oriented to person, place, and time. She appears well-developed and well-nourished.  HENT:  Head: Normocephalic and atraumatic.  Eyes: Conjunctivae and EOM are normal.  Pulmonary/Chest: Effort normal.  Neurological: She is alert  and oriented to person, place, and time.    Assessment and Plan [name] was seen today for diabetes.  Diagnoses and all orders for this visit:  Type 2 diabetes mellitus with complication, without long-term current use of insulin (HCC) -     Microalbumin, urine -     POCT glycosylated hemoglobin (Hb A1C) -  Discussed exercise and dietary changes and metformin compliance She should take metformin with lunch  Severe episode of recurrent major depressive disorder, without psychotic features ([place]) -  Follow up with therapist Will increase Celexa to '20mg'$   Pt to continue monitoring mood and add on exercise  Morbid Obesity -  Discussed nutrition and diet as well as eating at regular intervals.   Vaginal discharge Screening for std Advised metrogel since pt did not complete flagyl PO in a timely fashion Will check GC/CT   A total of 25 minutes were spent face-to-face with the patient during this encounter and over half of that time was spent on counseling and coordination of care.    [place]

## 2018-02-02 ENCOUNTER — Encounter: Payer: Self-pay | Admitting: Family Medicine

## 2018-02-02 ENCOUNTER — Other Ambulatory Visit: Payer: Self-pay

## 2018-02-02 ENCOUNTER — Ambulatory Visit: Payer: 59 | Admitting: Family Medicine

## 2018-02-02 VITALS — BP 128/72 | HR 100 | Temp 98.8°F | Resp 16 | Ht 68.0 in | Wt 289.0 lb

## 2018-02-02 DIAGNOSIS — E119 Type 2 diabetes mellitus without complications: Secondary | ICD-10-CM

## 2018-02-02 DIAGNOSIS — F332 Major depressive disorder, recurrent severe without psychotic features: Secondary | ICD-10-CM | POA: Diagnosis not present

## 2018-02-02 DIAGNOSIS — R102 Pelvic and perineal pain: Secondary | ICD-10-CM | POA: Diagnosis not present

## 2018-02-02 DIAGNOSIS — Z113 Encounter for screening for infections with a predominantly sexual mode of transmission: Secondary | ICD-10-CM

## 2018-02-02 DIAGNOSIS — E118 Type 2 diabetes mellitus with unspecified complications: Secondary | ICD-10-CM

## 2018-02-02 DIAGNOSIS — N898 Other specified noninflammatory disorders of vagina: Secondary | ICD-10-CM

## 2018-02-02 LAB — POCT GLYCOSYLATED HEMOGLOBIN (HGB A1C): Hemoglobin A1C: 7.5 % — AB (ref 4.0–5.6)

## 2018-02-02 LAB — POCT URINALYSIS DIP (MANUAL ENTRY)
Bilirubin, UA: NEGATIVE
Glucose, UA: NEGATIVE mg/dL
Ketones, POC UA: NEGATIVE mg/dL
Nitrite, UA: NEGATIVE
Protein Ur, POC: NEGATIVE mg/dL
Spec Grav, UA: 1.015 (ref 1.010–1.025)
Urobilinogen, UA: 0.2 E.U./dL
pH, UA: 6 (ref 5.0–8.0)

## 2018-02-02 LAB — POCT WET + KOH PREP
Trich by wet prep: ABSENT
Yeast by KOH: ABSENT
Yeast by wet prep: ABSENT

## 2018-02-02 MED ORDER — METFORMIN HCL 500 MG PO TABS: 500.0000 mg | ORAL_TABLET | Freq: Every day | ORAL | 0 refills | Status: DC

## 2018-02-02 MED ORDER — AZITHROMYCIN 500 MG PO TABS: ORAL_TABLET | ORAL | 0 refills | Status: DC

## 2018-02-02 MED ORDER — CITALOPRAM HYDROBROMIDE 20 MG PO TABS: 20.0000 mg | ORAL_TABLET | Freq: Every day | ORAL | 0 refills | Status: DC

## 2018-02-02 MED ORDER — CEFTRIAXONE SODIUM 250 MG IJ SOLR
250.0000 mg | Freq: Once | INTRAMUSCULAR | Status: AC
  Administered 2018-02-02: 250 mg via INTRAMUSCULAR

## 2018-02-02 NOTE — Patient Instructions (Addendum)
Take azithromycin 2 tablets for treatment  We will call you when results are final    IF you received an x-ray today, you will receive an invoice from [facility]. Please contact [facility] at [phone] with questions or concerns regarding your invoice.   IF you received labwork today, you will receive an invoice from [facility]. Please contact [facility] at [phone] with questions or concerns regarding your invoice.   Our billing staff will not be able to assist you with questions regarding bills from these companies.  You will be contacted with the lab results as soon as they are available. The fastest way to get your results is to activate your My Chart account. Instructions are located on the last page of this paperwork. If you have not heard from [place] regarding the results in 2 weeks, please contact this office.      Cervicitis Cervicitis is irritation and swelling of the cervix. The cervix is the lower and narrow end of the uterus. It is the part of the uterus that opens up to the vagina. What are the causes? This condition may be caused by:  An STI (sexually transmitted infection), such as gonorrhea, chlamydia, or genital herpes.  Objects that are put in the vagina, such as tampons or birth control devices. This usually occurs if an object is left in for too long.  Chemical irritation or allergic reaction. This may be from vaginal douches, latex condoms, or contraceptive creams.  An injury to the cervix.  A bacterial infection.  Radiation therapy.  What increases the risk? You are more likely to develop this condition if:  You have unprotected sex.  You have sex with many partners.  You have a new sexual partner.  You start having sex at an early age.  You have a history of STIs.  What are the signs or symptoms? Symptoms of this condition include:  Pearline Cables, white, yellow, or bad-smelling vaginal discharge.  Pain or itchiness around the  vagina.  Pain during sex.  Pain in the lower abdomen or lower back, especially during sex.  Urinating often.  Pain during urination.  Abnormal vaginal bleeding, such as bleeding between periods, after sex, or after menopause.  In some cases, there are no symptoms. How is this diagnosed? This condition may be diagnosed with:  A pelvic exam. Your health care provider will examine whether the cervix has an unusual discharge or bleeds easily when touched with a swab.  A wet prep. This is a test in which vaginal discharge is examined under a microscope to check for signs of infection.  A swab test of the cervix. For this test, sample cells from the cervix are collected on a swab and examined under a microscope to check for signs of infection.  Urine tests.  How is this treated? Treatment for cervicitis depends on what is causing the condition. Treatment may include:  Antibiotic medicines. These are used to treat certain infections, including STIs like gonorrhea or chlamydia. If you are taking these medicines to treat an STI, your sexual partner may also need to take these medicines.  Antiviral medicines. These are used to treat herpes simplex virus. Your sexual partner may also need to take these medicines.  Stopping use of items that cause irritation, such as tampons, latex condoms, douches, or spermicides.  Follow these instructions at home:  Do not have sex until your health care provider says it is okay.  Take over-the-counter and prescription medicines only as told by your  health care provider.  If you were prescribed an antibiotic, take it as told by your health care provider. Do not stop taking the antibiotic even if you start to feel better.  Keep all follow-up visits as told by your health care provider. This is important. Contact a health care provider if:  Your symptoms come back or get worse after treatment.  You have a fever.  You have fatigue.  You have pain  in your abdomen.  You experience nausea, vomiting, or diarrhea.  You have back pain. Get help right away if:  You have severe abdominal pain that cannot be helped with medicine.  You cannot urinate. Summary  Cervicitis is irritation and swelling of the cervix.  This condition may be caused by an STI (sexually transmitted infection), an allergic reaction or chemical irritation, radiation therapy, or objects that are put in the vagina, such as tampons or diaphragms.  Symptoms of this condition can include unusual vaginal discharge, painful urination, irritation or pain around the vagina, bleeding between periods or after sex, and pain during sex.  You are more likely to develop this condition if you have unprotected sex, have many sexual partners, or have a history of STIs.  This condition may be treated with antibiotic or antiviral medicines or by stopping use of items that cause irritation. This information is not intended to replace advice given to you by your health care provider. Make sure you discuss any questions you have with your health care provider. Document Released: 07/20/2005 Document Revised: 04/04/2016 Document Reviewed: 04/04/2016 Elsevier Interactive Patient Education  2017 [employer].

## 2018-02-03 LAB — GC/CHLAMYDIA PROBE AMP
Chlamydia trachomatis, NAA: NEGATIVE
Neisseria gonorrhoeae by PCR: NEGATIVE

## 2018-02-03 LAB — MICROALBUMIN, URINE: Microalbumin, Urine: 7.3 ug/mL

## 2018-02-04 ENCOUNTER — Ambulatory Visit
Admission: RE | Admit: 2018-02-04 | Discharge: 2018-02-04 | Disposition: A | Discharge status: Home / Self Care | Payer: 59 | Source: Ambulatory Visit | Attending: Family Medicine | Admitting: Family Medicine

## 2018-02-04 DIAGNOSIS — H53413 Scotoma involving central area, bilateral: Secondary | ICD-10-CM

## 2018-02-04 DIAGNOSIS — G4489 Other headache syndrome: Secondary | ICD-10-CM

## 2018-02-04 DIAGNOSIS — G4452 New daily persistent headache (NDPH): Secondary | ICD-10-CM

## 2018-02-05 MED ORDER — METRONIDAZOLE 0.75 % VA GEL: 1.0000 | Freq: Two times a day (BID) | VAGINAL | 0 refills | Status: AC

## 2018-02-14 ENCOUNTER — Encounter: Payer: Self-pay | Admitting: Family Medicine

## 2018-02-18 DIAGNOSIS — E1165 Type 2 diabetes mellitus with hyperglycemia: Secondary | ICD-10-CM | POA: Insufficient documentation

## 2018-02-21 ENCOUNTER — Encounter: Payer: Self-pay | Admitting: Family Medicine

## 2018-02-21 DIAGNOSIS — F908 Attention-deficit hyperactivity disorder, other type: Secondary | ICD-10-CM

## 2018-02-23 NOTE — Telephone Encounter (Signed)
I will refer the patient for testing.

## 2018-03-08 ENCOUNTER — Ambulatory Visit: Payer: Self-pay | Admitting: Registered"

## 2018-03-24 ENCOUNTER — Encounter: Payer: Self-pay | Attending: Family Medicine | Admitting: Registered"

## 2018-03-24 ENCOUNTER — Encounter: Payer: Self-pay | Admitting: Registered"

## 2018-03-24 DIAGNOSIS — Z713 Dietary counseling and surveillance: Secondary | ICD-10-CM | POA: Insufficient documentation

## 2018-03-24 DIAGNOSIS — E119 Type 2 diabetes mellitus without complications: Secondary | ICD-10-CM | POA: Insufficient documentation

## 2018-03-24 NOTE — Patient Instructions (Addendum)
[email or web address]  Consider having 3 balanced meals per day and talking to your supervisor about your need to have a lunch break you can count on.  Ideas for breakfast: egg on english muffin; protein shake (glucose control) Fruit and nuts, yogurt  Consider tips for better sleep.  Good job on cutting back sweets. Great plan to cut back on soda. Consider diluting sweet tea with unsweet tea.  [place] class Managing Non-hunger eating [phone]

## 2018-03-24 NOTE — Progress Notes (Signed)
Diabetes Self-Management Education  Visit Type: First/Initial  Appt. Start Time: 1410 Appt. End Time: 1287  03/24/2018  [name], identified by name and date of birth, is a 32 y.o. female with a diagnosis of Diabetes: Type 2.   ASSESSMENT Pt states after the diagnosis she cut back on candy, her "weakness". Pt states she went to [place] and stayed with her sister for awhile and her changes went out the window, but since she has been back home ~2 weeks she is back on pay attention to what she eats.   Pt states although she doesn't do structured exercise due to neuropathy pain, she is moving more, does not like sitting around the house. 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 Pt states she is looking for full-time work M-F that is not as stressful where she can take better care of her health.   Patient states she gets focused on going from one task to another that she forgets to eat during the day and sometimes is in a hurry to get out of the house and will skip breakfast too. Patient states she has frequent headaches which skipping meals may be contributing factor.  Patient states she experiences some depression but is waiting to get back on medicaid to help with insurance to pay for counseling.   Diabetes Self-Management Education - 03/24/18 1425      Visit Information   Visit Type  First/Initial      Initial Visit   Diabetes Type  Type 2    Are you currently following a meal plan?  No    Are you taking your medications as prescribed?  Yes    Date Diagnosed  June 2019      Health Coping   How would you rate your overall health?  Poor      Psychosocial Assessment   How often do you need to have someone help you when you read instructions, pamphlets, or other written materials from your doctor or pharmacy?  1 - Never    What is the last  grade level you completed in school?  1 year college      Complications   Last HgB A1C per patient/outside source  7.5 %    How often do you check your blood sugar?  3-4 times/day    Fasting Blood glucose range (mg/dL)  130-179   160 avg   Postprandial Blood glucose range (mg/dL)  >200    Number of hyperglycemic episodes per week  1   280 felt dizzy, rush of adrenaline thought it was low   Can you tell when your blood sugar is high?  Yes    What do you do if your blood sugar is high?  drank water, eat something    Have you had a dilated eye exam in the past 12 months?  No    Have you had a dental exam in the past 12 months?  No    Are you checking your feet?  Yes    How many days per week are you checking your feet?  4      Dietary Intake   Breakfast  none 3x week OR cereal OR poptart     Snack (morning)  none    Lunch  none OR sub OR sandwich on flat bread OR  fast food fried chicken or burgers, fries, sprite or sweet tea    Snack (afternoon)  none    Dinner  noodle OR mac & cheese OR spaghetti o's OR PB sandwich    Snack (evening)  poptart    Beverage(s)  water, soda, sweet tea, bubbly       Exercise   Exercise Type  ADL's    How many days per week to you exercise?  0    How many minutes per day do you exercise?  0    Total minutes per week of exercise  0      Patient Education   Previous Diabetes Education  No    Disease state   Definition of diabetes, type 1 and 2, and the diagnosis of diabetes    Nutrition management   Role of diet in the treatment of diabetes and the relationship between the three main macronutrients and blood glucose level    Medications  Reviewed patients medication for diabetes, action, purpose, timing of dose and side effects.    Psychosocial adjustment  Role of stress on diabetes;Other (comment)   sleep     Individualized Goals (developed by patient)   Nutrition  General guidelines for healthy choices and portions discussed    Reducing Risk  Other  (comment)   work on getting more sleep     Outcomes   Expected Outcomes  Demonstrated interest in learning. Expect positive outcomes    Future DMSE  PRN    Program Status  Completed     Individualized Plan for Diabetes Self-Management Training:   Learning Objective:  Patient will have a greater understanding of diabetes self-management. Patient education plan is to attend individual and/or group sessions per assessed needs and concerns.   Patient Instructions  [email or web address]  Consider having 3 balanced meals per day and talking to your supervisor about your need to have a lunch break you can count on.  Ideas for breakfast: egg on english muffin; protein shake (glucose control) Fruit and nuts, yogurt  Consider tips for better sleep.  Good job on cutting back sweets. Great plan to cut back on soda. Consider diluting sweet tea with unsweet tea.  [place] class Managing Non-hunger eating [phone]   Expected Outcomes:  Demonstrated interest in learning. Expect positive outcomes  Education material provided: My Plate, sleep hygiene, Non-hunger eating course information.  If problems or questions, patient to contact team via:  Phone  Future DSME appointment: PRN

## 2018-07-17 ENCOUNTER — Encounter (HOSPITAL_COMMUNITY): Payer: Self-pay

## 2018-07-17 ENCOUNTER — Emergency Department (HOSPITAL_COMMUNITY)
Admission: EM | Admit: 2018-07-17 | Discharge: 2018-07-17 | Disposition: A | Discharge status: Home / Self Care | Payer: Self-pay | Attending: Emergency Medicine | Admitting: Emergency Medicine

## 2018-07-17 DIAGNOSIS — H00011 Hordeolum externum right upper eyelid: Secondary | ICD-10-CM | POA: Insufficient documentation

## 2018-07-17 DIAGNOSIS — L03213 Periorbital cellulitis: Secondary | ICD-10-CM | POA: Insufficient documentation

## 2018-07-17 DIAGNOSIS — E119 Type 2 diabetes mellitus without complications: Secondary | ICD-10-CM | POA: Insufficient documentation

## 2018-07-17 DIAGNOSIS — Z87891 Personal history of nicotine dependence: Secondary | ICD-10-CM | POA: Insufficient documentation

## 2018-07-17 DIAGNOSIS — J45909 Unspecified asthma, uncomplicated: Secondary | ICD-10-CM | POA: Insufficient documentation

## 2018-07-17 MED ORDER — AMOXICILLIN-POT CLAVULANATE 875-125 MG PO TABS: 1.0000 | ORAL_TABLET | Freq: Two times a day (BID) | ORAL | 0 refills | Status: AC

## 2018-07-17 NOTE — ED Provider Notes (Signed)
Emergency Department Provider Note   I have reviewed the triage vital signs and the nursing notes.   HISTORY  Chief Complaint No chief complaint on file.   HPI [name] is a 32 y.o. female with PMH of asthma resents to the emergency department for evaluation of swelling of the right upper eyelid.  Symptoms have been worsening over the past 4 days.  She has been applying warm compresses at home but has noticed worsening eye swelling now diffusely over the upper lid.  She is developing headache.  No pain with eye movement.  No vision changes.  No fevers or chills.  She has had mild drainage from the eye.  She has had stye before but improved with warm compresses the last time.  No lower lid swelling.  No radiation of symptoms or other modifying factors.  Past Medical History:  Diagnosis Date  . Asthma   . 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 approx 3-4cm at time of 01/2016 c-section  . Herpes   . Trichomonas vaginitis     Patient Active Problem List   Diagnosis Date Noted  . New persistent daily headache 01/10/2018  . Uterine leiomyoma 01/10/2018  . Family history of diabetes mellitus in mother 01/10/2018  . Paresthesia of both feet 01/10/2018  . Visual field scotoma of both eyes 01/10/2018  . Other headache syndrome 01/10/2018  . Newly diagnosed diabetes ([place]) 01/10/2018  . S/P primary low transverse C-section 01/08/2016  . Asthma, mild intermittent 08/14/2015  . Sickle cell trait ([place]) 09/01/2014  . Morbid (severe) obesity due to excess calories ([place]) 04/28/2012    Past Surgical History:  Procedure Laterality Date  . CESAREAN SECTION N/A 01/08/2016   Procedure: CESAREAN SECTION;  Surgeon: Aletha Halim, MD;  Location: [facility];  Service: Obstetrics;  Laterality: N/A;  . DILATION AND CURETTAGE OF UTERUS     Allergies Patient has no known allergies.  Family History  Problem Relation Age of Onset  . Diabetes Mother   . Hypertension  Mother   . Hyperlipidemia Mother   . Heart disease Mother   . Diabetes Sister   . Diabetes Brother     Social History Social History   Tobacco Use  . Smoking status: Former Smoker    Types: Cigarettes  . Smokeless tobacco: Never Used  . Tobacco comment: quit 2013  Substance Use Topics  . Alcohol use: No    Comment: occasional, not with preg  . Drug use: No    Review of Systems  Constitutional: No fever/chills Eyes: Positive right upper eyelid swelling and pain.  Skin: Negative for rash. Neurological: Positive HA.  10-point ROS otherwise negative.  ____________________________________________   PHYSICAL EXAM:  VITAL SIGNS: Vitals:   07/17/18 1109  BP: (!) 140/103  Pulse: 92  Resp: 18  Temp: 98.2 F (36.8 C)  SpO2: 99%    Constitutional: Alert and oriented. Well appearing and in no acute distress. Eyes: Conjunctivae are normal. EOMI. Stye over the right upper, medial eyelid with mild erythema spreading over the entire upper lid. Normal lower lid.  Head: Atraumatic. Nose: No congestion/rhinnorhea. Mouth/Throat: Mucous membranes are moist.  Neck: No stridor.   Cardiovascular: Normal rate, regular rhythm.  Respiratory: Normal respiratory effort.  Gastrointestinal: No distention.  Musculoskeletal: No gross deformities of extremities. Neurologic:  Normal speech and language.  Skin:  Skin is warm, dry and intact. Mild periorbital erythema and associated stye.   ____________________________________________   PROCEDURES  Procedure(s) performed:  Procedures  None ____________________________________________   INITIAL IMPRESSION / ASSESSMENT AND PLAN / ED COURSE  Pertinent labs & imaging results that were available during my care of the patient were reviewed by me and considered in my medical decision making (see chart for details).  Patient presents to the emergency department with stye of the right upper eyelid.  In addition to the focal area of  swelling there is surrounding erythema and mild edema over the upper eyelid and periorbital area.  Possibly some developing preseptal cellulitis.  Plan to cover with Augmentin and advised patient to continue warm compresses along with Tylenol/Motrin as needed for pain.  No concern for orbital cellulitis.  Plan for PCP follow-up if symptoms continue.    ____________________________________________  FINAL CLINICAL IMPRESSION(S) / ED DIAGNOSES  Final diagnoses:  Hordeolum externum of right upper eyelid  Periorbital cellulitis of right eye    NEW OUTPATIENT MEDICATIONS STARTED DURING THIS VISIT:  New Prescriptions   AMOXICILLIN-CLAVULANATE (AUGMENTIN) 875-125 MG TABLET    Take 1 tablet by mouth every 12 (twelve) hours for 7 days.    Note:  This document was prepared using Dragon voice recognition software and may include unintentional dictation errors.  Nanda Quinton, MD Emergency Medicine    Alyda Megna, [facility], MD 07/17/18 1116

## 2018-07-17 NOTE — ED Notes (Signed)
Pt stable, ambulatory, states understanding of discharge instructions 

## 2018-07-17 NOTE — ED Triage Notes (Signed)
Patient complains of pain and swelling to right upper eyelid since Tuesday, reports mild drainage from same. Has been using hot compresses with no relief

## 2018-07-17 NOTE — Discharge Instructions (Signed)
Continue warm compresses at home and return to the ED with any fever, chills, or vision changes.

## 2018-07-28 ENCOUNTER — Encounter: Payer: Self-pay | Admitting: Family Medicine

## 2018-07-28 ENCOUNTER — Other Ambulatory Visit: Payer: Self-pay | Admitting: Family Medicine

## 2018-07-28 ENCOUNTER — Other Ambulatory Visit: Payer: Self-pay | Admitting: *Deleted

## 2018-07-28 DIAGNOSIS — E119 Type 2 diabetes mellitus without complications: Secondary | ICD-10-CM

## 2018-07-28 MED ORDER — METFORMIN HCL 500 MG PO TABS: 500.0000 mg | ORAL_TABLET | Freq: Every day | ORAL | 0 refills | Status: DC

## 2018-07-29 ENCOUNTER — Other Ambulatory Visit: Payer: Self-pay | Admitting: Family Medicine

## 2018-07-29 DIAGNOSIS — E119 Type 2 diabetes mellitus without complications: Secondary | ICD-10-CM

## 2018-08-05 ENCOUNTER — Emergency Department (HOSPITAL_COMMUNITY)
Admission: EM | Admit: 2018-08-05 | Discharge: 2018-08-05 | Disposition: A | Discharge status: Home / Self Care | Payer: Self-pay | Attending: Emergency Medicine | Admitting: Emergency Medicine

## 2018-08-05 DIAGNOSIS — Z7984 Long term (current) use of oral hypoglycemic drugs: Secondary | ICD-10-CM | POA: Insufficient documentation

## 2018-08-05 DIAGNOSIS — Z87891 Personal history of nicotine dependence: Secondary | ICD-10-CM | POA: Insufficient documentation

## 2018-08-05 DIAGNOSIS — Z79899 Other long term (current) drug therapy: Secondary | ICD-10-CM | POA: Insufficient documentation

## 2018-08-05 DIAGNOSIS — J45909 Unspecified asthma, uncomplicated: Secondary | ICD-10-CM | POA: Insufficient documentation

## 2018-08-05 DIAGNOSIS — R739 Hyperglycemia, unspecified: Secondary | ICD-10-CM

## 2018-08-05 DIAGNOSIS — E119 Type 2 diabetes mellitus without complications: Secondary | ICD-10-CM

## 2018-08-05 DIAGNOSIS — E1165 Type 2 diabetes mellitus with hyperglycemia: Secondary | ICD-10-CM | POA: Insufficient documentation

## 2018-08-05 LAB — CBG MONITORING, ED
Glucose-Capillary: 405 mg/dL — ABNORMAL HIGH (ref 70–99)
Glucose-Capillary: 323 mg/dL — ABNORMAL HIGH (ref 70–99)

## 2018-08-05 LAB — CBC WITH DIFFERENTIAL/PLATELET
Abs Immature Granulocytes: 0.03 10*3/uL (ref 0.00–0.07)
Basophils Absolute: 0 10*3/uL (ref 0.0–0.1)
Basophils Relative: 1 %
Eosinophils Absolute: 0.1 10*3/uL (ref 0.0–0.5)
Eosinophils Relative: 1 %
HCT: 42.4 % (ref 36.0–46.0)
Hemoglobin: 13.9 g/dL (ref 12.0–15.0)
Immature Granulocytes: 0 %
Lymphocytes Relative: 36 %
Lymphs Abs: 2.9 10*3/uL (ref 0.7–4.0)
MCH: 25.8 pg — ABNORMAL LOW (ref 26.0–34.0)
MCHC: 32.8 g/dL (ref 30.0–36.0)
MCV: 78.7 fL — ABNORMAL LOW (ref 80.0–100.0)
Monocytes Absolute: 0.4 10*3/uL (ref 0.1–1.0)
Monocytes Relative: 4 %
Neutro Abs: 4.7 10*3/uL (ref 1.7–7.7)
Neutrophils Relative %: 58 %
Platelets: 320 10*3/uL (ref 150–400)
RBC: 5.39 MIL/uL — ABNORMAL HIGH (ref 3.87–5.11)
RDW: 13.2 % (ref 11.5–15.5)
WBC: 8.1 10*3/uL (ref 4.0–10.5)
nRBC: 0 % (ref 0.0–0.2)

## 2018-08-05 LAB — URINALYSIS, ROUTINE W REFLEX MICROSCOPIC
Bacteria, UA: NONE SEEN
Bilirubin Urine: NEGATIVE
Glucose, UA: >=500 mg/dL — AB
Ketones, ur: 5 mg/dL — AB
Leukocytes, UA: NEGATIVE
Nitrite: NEGATIVE
Protein, ur: NEGATIVE mg/dL
Specific Gravity, Urine: 1.022 (ref 1.005–1.030)
pH: 5 (ref 5.0–8.0)

## 2018-08-05 LAB — BASIC METABOLIC PANEL
Anion gap: 9 (ref 5–15)
BUN: 7 mg/dL (ref 6–20)
CO2: 25 mmol/L (ref 22–32)
Calcium: 9.8 mg/dL (ref 8.9–10.3)
Chloride: 98 mmol/L (ref 98–111)
Creatinine, Ser: 0.88 mg/dL (ref 0.44–1.00)
GFR calc Af Amer: >60 mL/min (ref >60)
GFR calc non Af Amer: >60 mL/min (ref >60)
Glucose, Bld: 418 mg/dL — ABNORMAL HIGH (ref 70–99)
Potassium: 3.9 mmol/L (ref 3.5–5.1)
Sodium: 132 mmol/L — ABNORMAL LOW (ref 135–145)

## 2018-08-05 LAB — I-STAT VENOUS BLOOD GAS, ED
Bicarbonate: 26.8 mmol/L (ref 20.0–28.0)
O2 Saturation: 33 %
TCO2: 28 mmol/L (ref 22–32)
pCO2, Ven: 49.4 mmHg (ref 44.0–60.0)
pH, Ven: 7.342 (ref 7.250–7.430)
pO2, Ven: 22 mmHg — CL (ref 32.0–45.0)

## 2018-08-05 LAB — PREGNANCY, URINE: Preg Test, Ur: NEGATIVE

## 2018-08-05 MED ORDER — INSULIN ASPART 100 UNIT/ML ~~LOC~~ SOLN: 4.0000 [IU] | Freq: Once | SUBCUTANEOUS | Status: DC

## 2018-08-05 MED ORDER — METFORMIN HCL 1000 MG PO TABS: 1000.0000 mg | ORAL_TABLET | Freq: Two times a day (BID) | ORAL | 1 refills | Status: DC

## 2018-08-05 MED ORDER — SODIUM CHLORIDE 0.9 % IV BOLUS
1000.0000 mL | Freq: Once | INTRAVENOUS | Status: AC
  Administered 2018-08-05: 1000 mL via INTRAVENOUS

## 2018-08-05 NOTE — Discharge Instructions (Addendum)
You were seen in the emergency department for elevated blood sugars and feeling lightheaded and dehydrated.  You were given some IV fluids here with improvement in your symptoms.  We are increasing your metformin to 1000 mg twice a day.  Will be important for you to follow-up with your doctor for reevaluation.

## 2018-08-05 NOTE — ED Triage Notes (Signed)
Pt here with hyperglycemia cbg at home 398 , pt is on metformin but states that she has not been eating right

## 2018-08-05 NOTE — ED Notes (Signed)
Pt. Notified that we need a urine sample. Pt. Stated that she just used the bathroom

## 2018-08-05 NOTE — ED Provider Notes (Signed)
[facility] EMERGENCY DEPARTMENT Provider Note   CSN: [id number] Arrival date & time: 08/05/18  2440     History   Chief Complaint Chief Complaint  Patient presents with  . Hyperglycemia    HPI [name] is a 33 y.o. female.  She is complaining of elevated blood sugars since before Christmas.  She said she was diagnosed with type 2 diabetes over the summer and has been taking metformin.  Her symptoms of been dizziness lightheadedness along with thirst and urinating a lot.  No fevers or chills no vomiting or diarrhea.  She does not have anybody to help her manage her diabetes as she lost her insurance but she thinks she will be getting her back on January 15.  She said she has not been great about her diet.  The history is provided by the patient.  Hyperglycemia  Blood sugar level PTA:  400 Severity:  Moderate Onset quality:  Gradual Timing:  Unable to specify Progression:  Unchanged Chronicity:  New Diabetes status:  Controlled with oral medications Context: recent change in diet   Relieved by:  Nothing Ineffective treatments:  None tried Associated symptoms: dehydration, dizziness, increased thirst and polyuria   Associated symptoms: no abdominal pain, no chest pain, no dysuria, no fever, no shortness of breath, no syncope and no vomiting     Past Medical History:  Diagnosis Date  . Asthma   . 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 approx 3-4cm at time of 01/2016 c-section  . Herpes   . Trichomonas vaginitis     Patient Active Problem List   Diagnosis Date Noted  . New persistent daily headache 01/10/2018  . Uterine leiomyoma 01/10/2018  . Family history of diabetes mellitus in mother 01/10/2018  . Paresthesia of both feet 01/10/2018  . Visual field scotoma of both eyes 01/10/2018  . Other headache syndrome 01/10/2018  . Newly diagnosed diabetes ([place]) 01/10/2018  . S/P primary low transverse C-section 01/08/2016  . Asthma,  mild intermittent 08/14/2015  . Sickle cell trait ([place]) 09/01/2014  . Morbid (severe) obesity due to excess calories ([place]) 04/28/2012    Past Surgical History:  Procedure Laterality Date  . CESAREAN SECTION N/A 01/08/2016   Procedure: CESAREAN SECTION;  Surgeon: Aletha Halim, MD;  Location: [facility];  Service: Obstetrics;  Laterality: N/A;  . DILATION AND CURETTAGE OF UTERUS       OB History    Gravida  4   Para  3   Term  3   Preterm      AB  1   Living  3     SAB  1   TAB      Ectopic      Multiple  0   Live Births  3            Home Medications    Prior to Admission medications   Medication Sig Start Date End Date Taking? Authorizing Provider  azithromycin (ZITHROMAX) 500 MG tablet Take 2 tabs by  Mouth for one dose. 02/02/18   Forrest Moron, MD  blood glucose meter kit and supplies KIT Dispense based on patient and insurance preference. Use up to four times daily as directed. (FOR ICD-10 E11.9). 01/17/18   Forrest Moron, MD  citalopram (CELEXA) 20 MG tablet Take 1 tablet (20 mg total) by mouth daily. 02/02/18   Forrest Moron, MD  Continuous Blood Gluc Receiver (FREESTYLE LIBRE 14 DAY READER) DEVI  1 application by Does not apply route 2 (two) times daily. 01/17/18   Forrest Moron, MD  Continuous Blood Gluc Sensor (FREESTYLE LIBRE 14 DAY SENSOR) MISC 1 Device by Does not apply route every 14 (fourteen) days. 01/17/18   Forrest Moron, MD  gabapentin (NEURONTIN) 300 MG capsule Take 1 capsule (300 mg total) by mouth at bedtime. 01/10/18   Delia Chimes A, MD  glucose blood test strip Use to check blood glucose twice daily. Use as instructed 01/17/18   Forrest Moron, MD  ibuprofen (ADVIL,MOTRIN) 600 MG tablet Take 1 tablet (600 mg total) by mouth every 6 (six) hours as needed. 01/31/17   Caccavale, Sophia, PA-C  Lancet Devices (LANCING DEVICE) MISC Use to check blood glucose twice daily.  E 11.9 01/17/18   Delia Chimes A, MD  metFORMIN  (GLUCOPHAGE) 500 MG tablet Take 1 tablet (500 mg total) by mouth daily with lunch. 07/28/18   Forrest Moron, MD  topiramate (TOPAMAX) 25 MG tablet Take 1/2 tablet at bedtime for a week, by week 2 increase to 1 tablet at bedtime, by week 3 increase to 1 tablet twice a day. 01/17/18   Forrest Moron, MD    Family History Family History  Problem Relation Age of Onset  . Diabetes Mother   . Hypertension Mother   . Hyperlipidemia Mother   . Heart disease Mother   . Diabetes Sister   . Diabetes Brother     Social History Social History   Tobacco Use  . Smoking status: Former Smoker    Types: Cigarettes  . Smokeless tobacco: Never Used  . Tobacco comment: quit 2013  Substance Use Topics  . Alcohol use: No    Comment: occasional, not with preg  . Drug use: No     Allergies   Patient has no known allergies.   Review of Systems Review of Systems  Constitutional: Negative for fever.  HENT: Negative for sore throat.   Eyes: Negative for visual disturbance.  Respiratory: Negative for shortness of breath.   Cardiovascular: Negative for chest pain and syncope.  Gastrointestinal: Negative for abdominal pain and vomiting.  Endocrine: Positive for polydipsia and polyuria.  Genitourinary: Negative for dysuria.  Musculoskeletal: Negative for neck pain.  Skin: Negative for rash.  Neurological: Positive for dizziness.     Physical Exam Updated Vital Signs BP (!) 146/87 (BP Location: Right Arm)   Pulse 88   Temp 97.9 F (36.6 C) (Oral)   Resp 18   SpO2 100%   Physical Exam Vitals signs and nursing note reviewed.  Constitutional:      General: She is not in acute distress.    Appearance: She is well-developed.  HENT:     Head: Normocephalic and atraumatic.  Eyes:     Conjunctiva/sclera: Conjunctivae normal.  Neck:     Musculoskeletal: Neck supple.  Cardiovascular:     Rate and Rhythm: Normal rate and regular rhythm.     Heart sounds: No murmur.  Pulmonary:      Effort: Pulmonary effort is normal. No respiratory distress.     Breath sounds: Normal breath sounds.  Abdominal:     Palpations: Abdomen is soft.     Tenderness: There is no abdominal tenderness.  Skin:    General: Skin is warm and dry.     Capillary Refill: Capillary refill takes less than 2 seconds.  Neurological:     General: No focal deficit present.     Mental Status: She is  alert and oriented to person, place, and time.     Gait: Gait normal.      ED Treatments / Results  Labs (all labs ordered are listed, but only abnormal results are displayed) Labs Reviewed  BASIC METABOLIC PANEL - Abnormal; Notable for the following components:      Result Value   Sodium 132 (*)    Glucose, Bld 418 (*)    All other components within normal limits  CBC WITH DIFFERENTIAL/PLATELET - Abnormal; Notable for the following components:   RBC 5.39 (*)    MCV 78.7 (*)    MCH 25.8 (*)    All other components within normal limits  URINALYSIS, ROUTINE W REFLEX MICROSCOPIC - Abnormal; Notable for the following components:   Color, Urine STRAW (*)    Glucose, UA >=500 (*)    Hgb urine dipstick SMALL (*)    Ketones, ur 5 (*)    All other components within normal limits  CBG MONITORING, ED - Abnormal; Notable for the following components:   Glucose-Capillary 405 (*)    All other components within normal limits  I-STAT VENOUS BLOOD GAS, ED - Abnormal; Notable for the following components:   pO2, Ven 22.0 (*)    All other components within normal limits  CBG MONITORING, ED - Abnormal; Notable for the following components:   Glucose-Capillary 323 (*)    All other components within normal limits  PREGNANCY, URINE    EKG None  Radiology No results found.  Procedures Procedures (including critical care time)  Medications Ordered in ED Medications  sodium chloride 0.9 % bolus 1,000 mL (0 mLs Intravenous Stopped 08/05/18 1520)     Initial Impression / Assessment and Plan / ED Course  I  have reviewed the triage vital signs and the nursing notes.  Pertinent labs & imaging results that were available during my care of the patient were reviewed by me and considered in my medical decision making (see chart for details).  Clinical Course as of Aug 06 1298  Fri Aug 05, 2018  1527 Patient's labs were reasonably normal other than an elevated blood sugar.  She is feeling better after fluids.  I do not think she needs any IV insulin right now.  Will increase her metformin to thousand twice daily and have her follow-up with her PCP.   [MB]    Clinical Course User Index [MB] Hayden Rasmussen, MD     Final Clinical Impressions(s) / ED Diagnoses   Final diagnoses:  Hyperglycemia    ED Discharge Orders         Ordered    metFORMIN (GLUCOPHAGE) 1000 MG tablet  2 times daily with meals     08/05/18 1519           Hayden Rasmussen, MD 08/06/18 1300

## 2018-08-23 ENCOUNTER — Telehealth: Payer: Self-pay | Admitting: Family Medicine

## 2018-08-23 NOTE — Telephone Encounter (Signed)
LVM for pt to call the office and schedule a visit with Dr. Nolon Rod for a Hospital F/U [place]. When pt calls back, please either make an appt if something is available quickly or if a sameday or hospital is needed, please call [facility] and ask for Carolynne Edouard or Brandi. Thank you!  **PER CHANDA**

## 2018-08-27 ENCOUNTER — Encounter (HOSPITAL_COMMUNITY): Payer: Self-pay | Admitting: Emergency Medicine

## 2018-08-27 ENCOUNTER — Other Ambulatory Visit: Payer: Self-pay

## 2018-08-27 ENCOUNTER — Inpatient Hospital Stay (HOSPITAL_COMMUNITY)
Admission: EM | Admit: 2018-08-27 | Discharge: 2018-08-29 | DRG: 639 | Disposition: A | Discharge status: Home / Self Care | Payer: Medicaid Other | Attending: Internal Medicine | Admitting: Internal Medicine

## 2018-08-27 ENCOUNTER — Emergency Department (HOSPITAL_COMMUNITY): Payer: Medicaid Other

## 2018-08-27 DIAGNOSIS — L02415 Cutaneous abscess of right lower limb: Secondary | ICD-10-CM

## 2018-08-27 DIAGNOSIS — Z8349 Family history of other endocrine, nutritional and metabolic diseases: Secondary | ICD-10-CM

## 2018-08-27 DIAGNOSIS — F419 Anxiety disorder, unspecified: Secondary | ICD-10-CM | POA: Diagnosis present

## 2018-08-27 DIAGNOSIS — E119 Type 2 diabetes mellitus without complications: Secondary | ICD-10-CM | POA: Diagnosis present

## 2018-08-27 DIAGNOSIS — Z833 Family history of diabetes mellitus: Secondary | ICD-10-CM

## 2018-08-27 DIAGNOSIS — E1110 Type 2 diabetes mellitus with ketoacidosis without coma: Principal | ICD-10-CM | POA: Diagnosis present

## 2018-08-27 DIAGNOSIS — Z7984 Long term (current) use of oral hypoglycemic drugs: Secondary | ICD-10-CM | POA: Diagnosis not present

## 2018-08-27 DIAGNOSIS — J4520 Mild intermittent asthma, uncomplicated: Secondary | ICD-10-CM | POA: Diagnosis present

## 2018-08-27 DIAGNOSIS — Z8249 Family history of ischemic heart disease and other diseases of the circulatory system: Secondary | ICD-10-CM | POA: Diagnosis not present

## 2018-08-27 DIAGNOSIS — Z6839 Body mass index (BMI) 39.0-39.9, adult: Secondary | ICD-10-CM | POA: Diagnosis not present

## 2018-08-27 DIAGNOSIS — Z87891 Personal history of nicotine dependence: Secondary | ICD-10-CM | POA: Diagnosis not present

## 2018-08-27 DIAGNOSIS — D573 Sickle-cell trait: Secondary | ICD-10-CM | POA: Diagnosis present

## 2018-08-27 DIAGNOSIS — M79651 Pain in right thigh: Secondary | ICD-10-CM | POA: Diagnosis present

## 2018-08-27 DIAGNOSIS — Z793 Long term (current) use of hormonal contraceptives: Secondary | ICD-10-CM | POA: Diagnosis not present

## 2018-08-27 HISTORY — DX: Type 2 diabetes mellitus with ketoacidosis without coma: E11.10

## 2018-08-27 LAB — BASIC METABOLIC PANEL
Anion gap: 13 (ref 5–15)
Anion gap: 7 (ref 5–15)
Anion gap: 12 (ref 5–15)
Anion gap: 6 (ref 5–15)
BUN: 6 mg/dL (ref 6–20)
BUN: 6 mg/dL (ref 6–20)
BUN: 7 mg/dL (ref 6–20)
BUN: 7 mg/dL (ref 6–20)
CO2: 20 mmol/L — ABNORMAL LOW (ref 22–32)
CO2: 22 mmol/L (ref 22–32)
CO2: 21 mmol/L — ABNORMAL LOW (ref 22–32)
CO2: 25 mmol/L (ref 22–32)
Calcium: 8.9 mg/dL (ref 8.9–10.3)
Calcium: 8.8 mg/dL — ABNORMAL LOW (ref 8.9–10.3)
Calcium: 8.6 mg/dL — ABNORMAL LOW (ref 8.9–10.3)
Calcium: 8.5 mg/dL — ABNORMAL LOW (ref 8.9–10.3)
Chloride: 103 mmol/L (ref 98–111)
Chloride: 107 mmol/L (ref 98–111)
Chloride: 102 mmol/L (ref 98–111)
Chloride: 104 mmol/L (ref 98–111)
Creatinine, Ser: 0.81 mg/dL (ref 0.44–1.00)
Creatinine, Ser: 0.78 mg/dL (ref 0.44–1.00)
Creatinine, Ser: 0.83 mg/dL (ref 0.44–1.00)
Creatinine, Ser: 0.89 mg/dL (ref 0.44–1.00)
GFR calc Af Amer: >60 mL/min (ref >60)
GFR calc Af Amer: >60 mL/min (ref >60)
GFR calc Af Amer: >60 mL/min (ref >60)
GFR calc Af Amer: >60 mL/min (ref >60)
GFR calc non Af Amer: >60 mL/min (ref >60)
GFR calc non Af Amer: >60 mL/min (ref >60)
GFR calc non Af Amer: >60 mL/min (ref >60)
GFR calc non Af Amer: >60 mL/min (ref >60)
Glucose, Bld: 205 mg/dL — ABNORMAL HIGH (ref 70–99)
Glucose, Bld: 135 mg/dL — ABNORMAL HIGH (ref 70–99)
Glucose, Bld: 271 mg/dL — ABNORMAL HIGH (ref 70–99)
Glucose, Bld: 259 mg/dL — ABNORMAL HIGH (ref 70–99)
Potassium: 3.5 mmol/L (ref 3.5–5.1)
Potassium: 3.2 mmol/L — ABNORMAL LOW (ref 3.5–5.1)
Potassium: 3.7 mmol/L (ref 3.5–5.1)
Potassium: 3.1 mmol/L — ABNORMAL LOW (ref 3.5–5.1)
Sodium: 136 mmol/L (ref 135–145)
Sodium: 136 mmol/L (ref 135–145)
Sodium: 135 mmol/L (ref 135–145)
Sodium: 135 mmol/L (ref 135–145)

## 2018-08-27 LAB — CBC
HCT: 42.3 % (ref 36.0–46.0)
Hemoglobin: 14.2 g/dL (ref 12.0–15.0)
MCH: 26.2 pg (ref 26.0–34.0)
MCHC: 33.6 g/dL (ref 30.0–36.0)
MCV: 78.2 fL — ABNORMAL LOW (ref 80.0–100.0)
Platelets: 395 10*3/uL (ref 150–400)
RBC: 5.41 MIL/uL — ABNORMAL HIGH (ref 3.87–5.11)
RDW: 13.1 % (ref 11.5–15.5)
WBC: 9 10*3/uL (ref 4.0–10.5)
nRBC: 0 % (ref 0.0–0.2)

## 2018-08-27 LAB — HEMOGLOBIN A1C
Hgb A1c MFr Bld: 12.7 % — ABNORMAL HIGH (ref 4.8–5.6)
Mean Plasma Glucose: 317.79 mg/dL

## 2018-08-27 LAB — POCT I-STAT EG7
Acid-base deficit: 7 mmol/L — ABNORMAL HIGH (ref 0.0–2.0)
Bicarbonate: 18.7 mmol/L — ABNORMAL LOW (ref 20.0–28.0)
Calcium, Ion: 1.24 mmol/L (ref 1.15–1.40)
HCT: 43 % (ref 36.0–46.0)
Hemoglobin: 14.6 g/dL (ref 12.0–15.0)
O2 Saturation: 51 %
Potassium: 4.3 mmol/L (ref 3.5–5.1)
Sodium: 135 mmol/L (ref 135–145)
TCO2: 20 mmol/L — ABNORMAL LOW (ref 22–32)
pCO2, Ven: 39.2 mmHg — ABNORMAL LOW (ref 44.0–60.0)
pH, Ven: 7.288 (ref 7.250–7.430)
pO2, Ven: 30 mmHg — CL (ref 32.0–45.0)

## 2018-08-27 LAB — CBG MONITORING, ED
Glucose-Capillary: 313 mg/dL — ABNORMAL HIGH (ref 70–99)
Glucose-Capillary: 335 mg/dL — ABNORMAL HIGH (ref 70–99)
Glucose-Capillary: 274 mg/dL — ABNORMAL HIGH (ref 70–99)
Glucose-Capillary: 244 mg/dL — ABNORMAL HIGH (ref 70–99)

## 2018-08-27 LAB — GLUCOSE, CAPILLARY
Glucose-Capillary: 198 mg/dL — ABNORMAL HIGH (ref 70–99)
Glucose-Capillary: 183 mg/dL — ABNORMAL HIGH (ref 70–99)
Glucose-Capillary: 191 mg/dL — ABNORMAL HIGH (ref 70–99)
Glucose-Capillary: 181 mg/dL — ABNORMAL HIGH (ref 70–99)
Glucose-Capillary: 125 mg/dL — ABNORMAL HIGH (ref 70–99)
Glucose-Capillary: 97 mg/dL (ref 70–99)
Glucose-Capillary: 193 mg/dL — ABNORMAL HIGH (ref 70–99)
Glucose-Capillary: 338 mg/dL — ABNORMAL HIGH (ref 70–99)
Glucose-Capillary: 228 mg/dL — ABNORMAL HIGH (ref 70–99)

## 2018-08-27 LAB — COMPREHENSIVE METABOLIC PANEL
ALT: 12 U/L (ref 0–44)
AST: 12 U/L — ABNORMAL LOW (ref 15–41)
Albumin: 4.3 g/dL (ref 3.5–5.0)
Alkaline Phosphatase: 110 U/L (ref 38–126)
Anion gap: 20 — ABNORMAL HIGH (ref 5–15)
BUN: 9 mg/dL (ref 6–20)
CO2: 19 mmol/L — ABNORMAL LOW (ref 22–32)
Calcium: 9.6 mg/dL (ref 8.9–10.3)
Chloride: 94 mmol/L — ABNORMAL LOW (ref 98–111)
Creatinine, Ser: 1.2 mg/dL — ABNORMAL HIGH (ref 0.44–1.00)
GFR calc Af Amer: >60 mL/min (ref >60)
GFR calc non Af Amer: >60 mL/min (ref >60)
Glucose, Bld: 386 mg/dL — ABNORMAL HIGH (ref 70–99)
Potassium: 4.4 mmol/L (ref 3.5–5.1)
Sodium: 133 mmol/L — ABNORMAL LOW (ref 135–145)
Total Bilirubin: 0.9 mg/dL (ref 0.3–1.2)
Total Protein: 8.1 g/dL (ref 6.5–8.1)

## 2018-08-27 LAB — URINALYSIS, ROUTINE W REFLEX MICROSCOPIC
Bilirubin Urine: NEGATIVE
Glucose, UA: >=500 mg/dL — AB
Ketones, ur: 80 mg/dL — AB
Leukocytes, UA: NEGATIVE
Nitrite: NEGATIVE
Protein, ur: 30 mg/dL — AB
Specific Gravity, Urine: 1.018 (ref 1.005–1.030)
pH: 5 (ref 5.0–8.0)

## 2018-08-27 LAB — LIPASE, BLOOD: Lipase: 26 U/L (ref 11–51)

## 2018-08-27 LAB — MAGNESIUM: Magnesium: 1.8 mg/dL (ref 1.7–2.4)

## 2018-08-27 LAB — I-STAT BETA HCG BLOOD, ED (MC, WL, AP ONLY): I-stat hCG, quantitative: <5 m[IU]/mL (ref <5)

## 2018-08-27 LAB — PHOSPHORUS: Phosphorus: 2.7 mg/dL (ref 2.5–4.6)

## 2018-08-27 MED ORDER — ONDANSETRON HCL 4 MG/2ML IJ SOLN
4.0000 mg | Freq: Once | INTRAMUSCULAR | Status: AC
  Administered 2018-08-27: 4 mg via INTRAVENOUS
  Filled 2018-08-27: qty 2

## 2018-08-27 MED ORDER — INSULIN DETEMIR 100 UNIT/ML ~~LOC~~ SOLN
30.0000 [IU] | Freq: Every day | SUBCUTANEOUS | Status: DC
  Administered 2018-08-27: 30 [IU] via SUBCUTANEOUS
  Filled 2018-08-27: qty 0.3

## 2018-08-27 MED ORDER — DEXTROSE-NACL 5-0.45 % IV SOLN
INTRAVENOUS | Status: DC
  Administered 2018-08-27: 11:00:00 via INTRAVENOUS

## 2018-08-27 MED ORDER — DEXTROSE-NACL 5-0.45 % IV SOLN
INTRAVENOUS | Status: DC
  Administered 2018-08-27: 09:00:00 via INTRAVENOUS

## 2018-08-27 MED ORDER — SODIUM CHLORIDE 0.9 % IV SOLN
INTRAVENOUS | Status: DC
  Administered 2018-08-27: 11:00:00 via INTRAVENOUS

## 2018-08-27 MED ORDER — INSULIN ASPART 100 UNIT/ML ~~LOC~~ SOLN
0.0000 [IU] | SUBCUTANEOUS | Status: DC
  Administered 2018-08-27: 4 [IU] via SUBCUTANEOUS
  Administered 2018-08-27: 8 [IU] via SUBCUTANEOUS
  Administered 2018-08-27: 16 [IU] via SUBCUTANEOUS
  Administered 2018-08-28: 12 [IU] via SUBCUTANEOUS

## 2018-08-27 MED ORDER — INSULIN REGULAR(HUMAN) IN NACL 100-0.9 UT/100ML-% IV SOLN
INTRAVENOUS | Status: DC
  Administered 2018-08-27: 2.8 [IU]/h via INTRAVENOUS
  Filled 2018-08-27: qty 100

## 2018-08-27 MED ORDER — SODIUM CHLORIDE 0.9% FLUSH: 3.0000 mL | Freq: Once | INTRAVENOUS | Status: DC

## 2018-08-27 MED ORDER — ENOXAPARIN SODIUM 40 MG/0.4ML ~~LOC~~ SOLN
40.0000 mg | SUBCUTANEOUS | Status: DC
  Administered 2018-08-28 – 2018-08-29 (×2): 40 mg via SUBCUTANEOUS
  Filled 2018-08-27 (×2): qty 0.4

## 2018-08-27 MED ORDER — SODIUM CHLORIDE 0.9 % IV BOLUS
1000.0000 mL | Freq: Once | INTRAVENOUS | Status: AC
  Administered 2018-08-27: 1000 mL via INTRAVENOUS

## 2018-08-27 MED ORDER — SODIUM CHLORIDE 0.9 % IV SOLN: INTRAVENOUS | Status: DC

## 2018-08-27 MED ORDER — ONDANSETRON 4 MG PO TBDP
4.0000 mg | ORAL_TABLET | Freq: Once | ORAL | Status: AC | PRN
  Administered 2018-08-27: 4 mg via ORAL
  Filled 2018-08-27: qty 1

## 2018-08-27 MED ORDER — POTASSIUM CHLORIDE 10 MEQ/100ML IV SOLN
10.0000 meq | INTRAVENOUS | Status: AC
  Administered 2018-08-27: 10 meq via INTRAVENOUS
  Filled 2018-08-27: qty 100

## 2018-08-27 NOTE — H&P (Signed)
History and Physical    [name] JOI:[id number] DOB: 04-03-1986 DOA: 08/27/2018  PCP: Forrest Moron, MD  Patient coming from: home   I have personally briefly reviewed patient's old medical records in [place] and Care everywhere.   Chief Complaint: Nausea vomiting and abdominal pain.  HPI: [name] is a 33 y.o. female with medical history significant of recently diagnosed type 2 diabetes, anxiety who presents to the emergency room with 2 days of nausea vomiting, abdominal pain, thirst and feeling profoundly weak.  As per patient, she was diagnosed with diabetes about 6 months ago and has been on metformin.  She had lost her Medicaid and not able to have quite frequent follow-up.  She also is having issues with tiredness, however since last 3 days she has dull abdominal pain, generalized, 6 out of 10 in intensity, associated with frequent nausea but no vomiting.  She feels like starchy frothy secretions in her mouth.  She feels very dry and thirsty.  She just goes to bathroom too many times.  Denies any fever or chills.  She has chronic headache.  Denies any chest pain or shortness of breath.  Urine is frequent but no burning.  Bowel habits are normal. ED Course: Vital signs are stable.  Blood sugars are 400, anion gap 20.  Potassium is normal.  Afebrile.  Urine with more than 500 glucose and positive for ketones.  VBG with pH of 7.  2 8.  Bicarb is 18.  Due to ongoing nausea vomiting, patient was started on insulin infusion with diagnosis of DKA in the ER.  Review of Systems: As per HPI otherwise 10 point review of systems negative.    Past Medical History:  Diagnosis Date  . Asthma   . 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 approx 3-4cm at time of 01/2016 c-section  . Herpes   . Trichomonas vaginitis     Past Surgical History:  Procedure Laterality Date  . CESAREAN SECTION N/A 01/08/2016   Procedure: CESAREAN SECTION;  Surgeon: Aletha Halim, MD;  Location:  [facility];  Service: Obstetrics;  Laterality: N/A;  . DILATION AND CURETTAGE OF UTERUS       reports that she has quit smoking. Her smoking use included cigarettes. She has never used smokeless tobacco. She reports that she does not drink alcohol or use drugs.  No Known Allergies  Family History  Problem Relation Age of Onset  . Diabetes Mother   . Hypertension Mother   . Hyperlipidemia Mother   . Heart disease Mother   . Diabetes Sister   . Diabetes Brother      Prior to Admission medications   Medication Sig Start Date End Date Taking? Authorizing Provider  etonogestrel (NEXPLANON) 68 MG IMPL implant 1 each by Subdermal route once.   Yes [provider]  metFORMIN (GLUCOPHAGE) 1000 MG tablet Take 1 tablet (1,000 mg total) by mouth 2 (two) times daily with a meal. 08/05/18  Yes Hayden Rasmussen, MD  blood glucose meter kit and supplies KIT Dispense based on patient and insurance preference. Use up to four times daily as directed. (FOR ICD-10 E11.9). 01/17/18   Forrest Moron, MD  Continuous Blood Gluc Receiver (FREESTYLE LIBRE 14 DAY READER) DEVI 1 application by Does not apply route 2 (two) times daily. 01/17/18   Forrest Moron, MD  Continuous Blood Gluc Sensor (FREESTYLE LIBRE 14 DAY SENSOR) MISC 1 Device by Does not apply route every 14 (  fourteen) days. 01/17/18   Delia Chimes A, MD  glucose blood test strip Use to check blood glucose twice daily. Use as instructed 01/17/18   Forrest Moron, MD  Lancet Devices (LANCING DEVICE) MISC Use to check blood glucose twice daily.  E 11.9 01/17/18   Forrest Moron, MD    Physical Exam: Vitals:   08/27/18 0126 08/27/18 0354 08/27/18 0640 08/27/18 0642  BP:  103/86 122/73   Pulse:  100  94  Resp:  20    Temp:  98.4 F (36.9 C)    TempSrc:  Oral    SpO2:  98%  100%  Weight: 118.4 kg     Height: _0  (1.727 m)       Constitutional: NAD, calm, comfortable Vitals:   08/27/18 0126 08/27/18 0354 08/27/18 0640  08/27/18 0642  BP:  103/86 122/73   Pulse:  100  94  Resp:  20    Temp:  98.4 F (36.9 C)    TempSrc:  Oral    SpO2:  98%  100%  Weight: 118.4 kg     Height: _1  (1.727 m)      Eyes: PERRL, lids and conjunctivae normal ENMT: Mucous membranes are dry. Posterior pharynx clear of any exudate or lesions.Normal dentition.  Neck: normal, supple, no masses, no thyromegaly Respiratory: clear to auscultation bilaterally, no wheezing, no crackles. Normal respiratory effort. No accessory muscle use.  Cardiovascular: Regular rate and rhythm, no murmurs / rubs / gallops. No extremity edema. 2+ pedal pulses. No carotid bruits.  Abdomen: Mild generalized tenderness, no rigidity or guarding, no masses palpated. No hepatosplenomegaly. Bowel sounds positive.  Musculoskeletal: no clubbing / cyanosis. No joint deformity upper and lower extremities. Good ROM, no contractures. Normal muscle tone.  Skin: no rashes, lesions, ulcers. No induration Neurologic: CN 2-12 grossly intact. Sensation intact, DTR normal. Strength 5/5 in all 4.  Psychiatric: Normal judgment and insight. Alert and oriented x 3. Normal mood.     Labs on Admission: I have personally reviewed following labs and imaging studies  CBC: Recent Labs  Lab 08/27/18 0140 08/27/18 0711  WBC 9.0  --   HGB 14.2 14.6  HCT 42.3 43.0  MCV 78.2*  --   PLT 395  --    Basic Metabolic Panel: Recent Labs  Lab 08/27/18 0140 08/27/18 0711  NA 133* 135  K 4.4 4.3  CL 94*  --   CO2 19*  --   GLUCOSE 386*  --   BUN 9  --   CREATININE 1.20*  --   CALCIUM 9.6  --    GFR: Estimated Creatinine Clearance: 91.9 mL/min (A) (by C-G formula based on SCr of 1.2 mg/dL (H)). Liver Function Tests: Recent Labs  Lab 08/27/18 0140  AST 12*  ALT 12  ALKPHOS 110  BILITOT 0.9  PROT 8.1  ALBUMIN 4.3   Recent Labs  Lab 08/27/18 0140  LIPASE 26   No results for input(s): AMMONIA in the last 168 hours. Coagulation Profile: No results for  input(s): INR, PROTIME in the last 168 hours. Cardiac Enzymes: No results for input(s): CKTOTAL, CKMB, CKMBINDEX, TROPONINI in the last 168 hours. BNP (last 3 results) No results for input(s): PROBNP in the last 8760 hours. HbA1C: No results for input(s): HGBA1C in the last 72 hours. CBG: Recent Labs  Lab 08/27/18 0136 08/27/18 0704 08/27/18 0806  GLUCAP 313* 335* 274*   Lipid Profile: No results for input(s): CHOL, HDL, LDLCALC, TRIG, CHOLHDL, LDLDIRECT  in the last 72 hours. Thyroid Function Tests: No results for input(s): TSH, T4TOTAL, FREET4, T3FREE, THYROIDAB in the last 72 hours. Anemia Panel: No results for input(s): VITAMINB12, FOLATE, FERRITIN, TIBC, IRON, RETICCTPCT in the last 72 hours. Urine analysis:    Component Value Date/Time   COLORURINE STRAW (A) 08/27/2018 0138   APPEARANCEUR CLEAR 08/27/2018 0138   LABSPEC 1.018 08/27/2018 0138   PHURINE 5.0 08/27/2018 0138   GLUCOSEU >=500 (A) 08/27/2018 0138   HGBUR LARGE (A) 08/27/2018 0138   BILIRUBINUR NEGATIVE 08/27/2018 0138   BILIRUBINUR negative 02/02/2018 1436   KETONESUR 80 (A) 08/27/2018 0138   PROTEINUR 30 (A) 08/27/2018 0138   UROBILINOGEN 0.2 02/02/2018 1436   UROBILINOGEN 0.2 01/02/2016 0819   NITRITE NEGATIVE 08/27/2018 0138   LEUKOCYTESUR NEGATIVE 08/27/2018 0138    Radiological Exams on Admission: Dg Chest 2 View  Result Date: 08/27/2018 CLINICAL DATA:  Dizziness, nausea, vomiting and diarrhea starting yesterday. EXAM: CHEST - 2 VIEW COMPARISON:  Chest x-ray dated 02/06/2017. FINDINGS: The heart size and mediastinal contours are within normal limits. Both lungs are clear. The visualized skeletal structures are unremarkable. IMPRESSION: No active cardiopulmonary disease. No evidence of pneumonia or pulmonary edema. Electronically Signed   By: Franki Cabot M.D.   On: 08/27/2018 07:52    EKG: Independently reviewed. Sinus tachycardia.  No acute ST-T wave changes.  Assessment/Plan Principal  Problem:   DKA, type 2, not at goal [facility]) Active Problems:   Asthma, mild intermittent   Anxiety   DKA (diabetic ketoacidoses) (HCC)   Diabetic ketoacidosis: Currently with active symptoms.  Admit to progressive unit.  Continue IV insulin with titration.  Patient was given potassium in the ER. IV insulin and titration. N.p.o. with ice chips and oral meds until on IV insulin. Continue normal saline, changed to dextrose when blood sugars less than 250. BMP every 4 hours until on insulin drip. Check magnesium and phosphorus and will replace accordingly. No evidence of ongoing infection. Check A1c. Case management consultation, patient will need insulin in addition to metformin on discharge and she may need financial help.  Mild intermittent asthma: Without exacerbation.  Albuterol as needed.  Anxiety: Denies any active symptoms including suicidal homicidal ideation.   DVT prophylaxis: Lovenox subcu. Code Status: Full code. Family Communication: No family at bedside. Disposition Plan: Home. Consults called: None. Admission status: Inpatient.  Stepdown unit for insulin infusion.   Barb Merino MD [facility] Pager [phone]  If 7PM-7AM, please contact night-coverage [email or web address] Password [facility]  08/27/2018, 8:17 AM

## 2018-08-27 NOTE — ED Triage Notes (Signed)
Pt reports dizziness, nausea, vomiting, and diarrhea that started yesterday, 08/26/2018, at 1000hr. Pt states she is unable to keep anything down.

## 2018-08-27 NOTE — ED Notes (Signed)
CBG 313, RN Scott informed

## 2018-08-27 NOTE — ED Provider Notes (Signed)
[facility] DEPARTMENT Provider Note   CSN: [id number] Arrival date & time: 08/27/18  0044     History   Chief Complaint Chief Complaint  Patient presents with  . Dizziness  . Nausea  . Emesis    HPI [name] is a 33 y.o. female with history of asthma, diabetes mellitus, presents for evaluation of acute onset, persistent nausea and vomiting since yesterday at around 10 AM.  She reports multiple episodes of nonbloody nonbilious emesis.  She denies chest pain, shortness of breath, fevers.  She does note mild soreness of the abdomen after vomiting but denies any abdominal pain otherwise.  She also notes dry mouth, polydipsia and polyuria.  She reports that when she has checked her blood sugars at home they have been running very high.  She has not been able to keep down any food or drink.  She has not tried anything for her symptoms.  Reports that she was diagnosed with diabetes around July 2019 and has been taking metformin.  The history is provided by the patient.  Emesis  Associated symptoms: no abdominal pain, no chills and no fever     Past Medical History:  Diagnosis Date  . Asthma   . 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 approx 3-4cm at time of 01/2016 c-section  . Herpes   . Trichomonas vaginitis     Patient Active Problem List   Diagnosis Date Noted  . New persistent daily headache 01/10/2018  . Uterine leiomyoma 01/10/2018  . Family history of diabetes mellitus in mother 01/10/2018  . Paresthesia of both feet 01/10/2018  . Visual field scotoma of both eyes 01/10/2018  . Other headache syndrome 01/10/2018  . Newly diagnosed diabetes ([place]) 01/10/2018  . S/P primary low transverse C-section 01/08/2016  . Asthma, mild intermittent 08/14/2015  . Sickle cell trait ([place]) 09/01/2014  . Morbid (severe) obesity due to excess calories ([place]) 04/28/2012    Past Surgical History:  Procedure Laterality Date  . CESAREAN  SECTION N/A 01/08/2016   Procedure: CESAREAN SECTION;  Surgeon: Aletha Halim, MD;  Location: [facility];  Service: Obstetrics;  Laterality: N/A;  . DILATION AND CURETTAGE OF UTERUS       OB History    Gravida  4   Para  3   Term  3   Preterm      AB  1   Living  3     SAB  1   TAB      Ectopic      Multiple  0   Live Births  3            Home Medications    Prior to Admission medications   Medication Sig Start Date End Date Taking? Authorizing Provider  etonogestrel (NEXPLANON) 68 MG IMPL implant 1 each by Subdermal route once.   Yes [provider]  metFORMIN (GLUCOPHAGE) 1000 MG tablet Take 1 tablet (1,000 mg total) by mouth 2 (two) times daily with a meal. 08/05/18  Yes Hayden Rasmussen, MD  blood glucose meter kit and supplies KIT Dispense based on patient and insurance preference. Use up to four times daily as directed. (FOR ICD-10 E11.9). 01/17/18   Forrest Moron, MD  citalopram (CELEXA) 20 MG tablet Take 1 tablet (20 mg total) by mouth daily. Patient not taking: Reported on 08/27/2018 02/02/18   Forrest Moron, MD  Continuous Blood Gluc Receiver (FREESTYLE LIBRE Fraser) DEVI  1 application by Does not apply route 2 (two) times daily. 01/17/18   Forrest Moron, MD  Continuous Blood Gluc Sensor (FREESTYLE LIBRE 14 DAY SENSOR) MISC 1 Device by Does not apply route every 14 (fourteen) days. 01/17/18   Forrest Moron, MD  gabapentin (NEURONTIN) 300 MG capsule Take 1 capsule (300 mg total) by mouth at bedtime. Patient not taking: Reported on 08/27/2018 01/10/18   Delia Chimes A, MD  glucose blood test strip Use to check blood glucose twice daily. Use as instructed 01/17/18   Forrest Moron, MD  Lancet Devices (LANCING DEVICE) MISC Use to check blood glucose twice daily.  E 11.9 01/17/18   Delia Chimes A, MD  topiramate (TOPAMAX) 25 MG tablet Take 1/2 tablet at bedtime for a week, by week 2 increase to 1 tablet at bedtime, by week 3  increase to 1 tablet twice a day. Patient not taking: Reported on 08/05/2018 01/17/18   Forrest Moron, MD    Family History Family History  Problem Relation Age of Onset  . Diabetes Mother   . Hypertension Mother   . Hyperlipidemia Mother   . Heart disease Mother   . Diabetes Sister   . Diabetes Brother     Social History Social History   Tobacco Use  . Smoking status: Former Smoker    Types: Cigarettes  . Smokeless tobacco: Never Used  . Tobacco comment: quit 2013  Substance Use Topics  . Alcohol use: No    Comment: occasional, not with preg  . Drug use: No     Allergies   Patient has no known allergies.   Review of Systems Review of Systems  Constitutional: Negative for chills and fever.  Respiratory: Negative for shortness of breath.   Cardiovascular: Negative for chest pain.  Gastrointestinal: Positive for nausea and vomiting. Negative for abdominal pain.  Endocrine: Positive for polydipsia and polyuria.  All other systems reviewed and are negative.    Physical Exam Updated Vital Signs BP 122/73   Pulse 94   Temp 98.4 F (36.9 C) (Oral)   Resp 20   Ht '5\' 8"'$  (1.727 m)   Wt 118.4 kg   SpO2 100%   BMI 39.68 kg/m   Physical Exam Vitals signs and nursing note reviewed.  Constitutional:      General: She is not in acute distress.    Appearance: She is well-developed.  HENT:     Head: Normocephalic and atraumatic.     Mouth/Throat:     Mouth: Mucous membranes are dry.  Eyes:     General:        Right eye: No discharge.        Left eye: No discharge.     Conjunctiva/sclera: Conjunctivae normal.  Neck:     Vascular: No JVD.     Trachea: No tracheal deviation.  Cardiovascular:     Rate and Rhythm: Normal rate.  Pulmonary:     Effort: Pulmonary effort is normal.     Breath sounds: Normal breath sounds.  Abdominal:     General: Abdomen is flat. There is no distension.     Tenderness: There is no abdominal tenderness. There is no guarding or  rebound.  Skin:    General: Skin is warm and dry.     Findings: No erythema.  Neurological:     Mental Status: She is alert.  Psychiatric:        Behavior: Behavior normal.      ED Treatments /  Results  Labs (all labs ordered are listed, but only abnormal results are displayed) Labs Reviewed  COMPREHENSIVE METABOLIC PANEL - Abnormal; Notable for the following components:      Result Value   Sodium 133 (*)    Chloride 94 (*)    CO2 19 (*)    Glucose, Bld 386 (*)    Creatinine, Ser 1.20 (*)    AST 12 (*)    Anion gap 20 (*)    All other components within normal limits  CBC - Abnormal; Notable for the following components:   RBC 5.41 (*)    MCV 78.2 (*)    All other components within normal limits  URINALYSIS, ROUTINE W REFLEX MICROSCOPIC - Abnormal; Notable for the following components:   Color, Urine STRAW (*)    Glucose, UA >=500 (*)    Hgb urine dipstick LARGE (*)    Ketones, ur 80 (*)    Protein, ur 30 (*)    Bacteria, UA RARE (*)    All other components within normal limits  CBG MONITORING, ED - Abnormal; Notable for the following components:   Glucose-Capillary 313 (*)    All other components within normal limits  CBG MONITORING, ED - Abnormal; Notable for the following components:   Glucose-Capillary 335 (*)    All other components within normal limits  POCT I-STAT EG7 - Abnormal; Notable for the following components:   pCO2, Ven 39.2 (*)    pO2, Ven 30.0 (*)    Bicarbonate 18.7 (*)    TCO2 20 (*)    Acid-base deficit 7.0 (*)    All other components within normal limits  LIPASE, BLOOD  I-STAT BETA HCG BLOOD, ED ([facility], [facility], [facility] ONLY)  I-STAT BETA HCG BLOOD, ED ([facility], [facility], [facility] ONLY)  I-STAT VENOUS BLOOD GAS, ED    EKG EKG Interpretation  Date/Time:  Saturday August 27 2018 01:28:25 EST Ventricular Rate:  104 PR Interval:  128 QRS Duration: 78 QT Interval:  348 QTC Calculation: 457 R Axis:   62 Text Interpretation:  Sinus tachycardia Otherwise normal ECG  No significant change was found Confirmed by Ezequiel Essex [phone]) on 08/27/2018 7:54:40 AM   Radiology Dg Chest 2 View  Result Date: 08/27/2018 CLINICAL DATA:  Dizziness, nausea, vomiting and diarrhea starting yesterday. EXAM: CHEST - 2 VIEW COMPARISON:  Chest x-ray dated 02/06/2017. FINDINGS: The heart size and mediastinal contours are within normal limits. Both lungs are clear. The visualized skeletal structures are unremarkable. IMPRESSION: No active cardiopulmonary disease. No evidence of pneumonia or pulmonary edema. Electronically Signed   By: Franki Cabot M.D.   On: 08/27/2018 07:52    Procedures .Critical Care Performed by: Renita Papa, PA-C Authorized by: Renita Papa, PA-C   Critical care provider statement:    Critical care time (minutes):  35   Critical care was necessary to treat or prevent imminent or life-threatening deterioration of the following conditions:  Endocrine crisis   Critical care was time spent personally by me on the following activities:  Discussions with consultants, evaluation of patient's response to treatment, examination of patient, ordering and performing treatments and interventions, ordering and review of laboratory studies, ordering and review of radiographic studies, pulse oximetry, re-evaluation of patient's condition, obtaining history from patient or surrogate and review of old charts   I assumed direction of critical care for this patient from another provider in my specialty: no     (including critical care time)  Medications Ordered in ED Medications  sodium chloride flush (NS) 0.9 % injection 3 mL (has no administration in time range)  insulin regular, human (MYXREDLIN) 100 units/ 100 mL infusion (2.8 Units/hr Intravenous New Bag/Given 08/27/18 0718)  sodium chloride 0.9 % bolus 1,000 mL (has no administration in time range)    And  0.9 %  sodium chloride infusion (has no administration in time range)  dextrose 5 %-0.45 % sodium  chloride infusion (has no administration in time range)  potassium chloride 10 mEq in 100 mL IVPB (10 mEq Intravenous New Bag/Given 08/27/18 0716)  ondansetron (ZOFRAN-ODT) disintegrating tablet 4 mg (4 mg Oral Given 08/27/18 0132)  sodium chloride 0.9 % bolus 1,000 mL (1,000 mLs Intravenous New Bag/Given 08/27/18 0715)  ondansetron (ZOFRAN) injection 4 mg (4 mg Intravenous Given 08/27/18 0715)     Initial Impression / Assessment and Plan / ED Course  I have reviewed the triage vital signs and the nursing notes.  Pertinent labs & imaging results that were available during my care of the patient were reviewed by me and considered in my medical decision making (see chart for details).     Patient presenting for evaluation of nausea and vomiting.  She is afebrile, vital signs are stable.  She is nontoxic in appearance.  Diagnosed with diabetes in July, has been taking metformin and reports her blood sugars have been reading high at home.  Per chart review, she is only been to see her PCP once since her diagnosis.  Labwork reviewed by me shows hyperglycemia with glucose of 386, elevated anion gap of 28, and ketonuria consistent with DKA.  Her VBG shows normal pH.  Chest x-ray shows no active cardiopulmonary disease, no evidence of pneumonia or pulmonary edema.  EKG shows sinus tachycardia otherwise no significant changes noted.  She was given IV fluids and started on insulin drip.  Spoke with Dr. Sloan Leiter with [facility] hospital service who agrees to assume care of patient and bring her into the hospital for further evaluation and management.  Final Clinical Impressions(s) / ED Diagnoses   Final diagnoses:  Diabetic ketoacidosis without coma associated with type 2 diabetes mellitus [facility])    ED Discharge Orders    None       Debroah Baller 08/27/18 2423    Ezequiel Essex, MD 08/27/18 [phone]

## 2018-08-28 ENCOUNTER — Inpatient Hospital Stay (HOSPITAL_COMMUNITY): Payer: Medicaid Other

## 2018-08-28 DIAGNOSIS — E1110 Type 2 diabetes mellitus with ketoacidosis without coma: Principal | ICD-10-CM

## 2018-08-28 LAB — BASIC METABOLIC PANEL
Anion gap: 10 (ref 5–15)
Anion gap: 8 (ref 5–15)
BUN: 7 mg/dL (ref 6–20)
BUN: <5 mg/dL — ABNORMAL LOW (ref 6–20)
CO2: 23 mmol/L (ref 22–32)
CO2: 23 mmol/L (ref 22–32)
Calcium: 8.4 mg/dL — ABNORMAL LOW (ref 8.9–10.3)
Calcium: 8.5 mg/dL — ABNORMAL LOW (ref 8.9–10.3)
Chloride: 100 mmol/L (ref 98–111)
Chloride: 103 mmol/L (ref 98–111)
Creatinine, Ser: 0.83 mg/dL (ref 0.44–1.00)
Creatinine, Ser: 0.7 mg/dL (ref 0.44–1.00)
GFR calc Af Amer: >60 mL/min (ref >60)
GFR calc Af Amer: >60 mL/min (ref >60)
GFR calc non Af Amer: >60 mL/min (ref >60)
GFR calc non Af Amer: >60 mL/min (ref >60)
Glucose, Bld: 269 mg/dL — ABNORMAL HIGH (ref 70–99)
Glucose, Bld: 222 mg/dL — ABNORMAL HIGH (ref 70–99)
Potassium: 3.4 mmol/L — ABNORMAL LOW (ref 3.5–5.1)
Potassium: 3.5 mmol/L (ref 3.5–5.1)
Sodium: 133 mmol/L — ABNORMAL LOW (ref 135–145)
Sodium: 134 mmol/L — ABNORMAL LOW (ref 135–145)

## 2018-08-28 LAB — GLUCOSE, CAPILLARY
Glucose-Capillary: 251 mg/dL — ABNORMAL HIGH (ref 70–99)
Glucose-Capillary: 216 mg/dL — ABNORMAL HIGH (ref 70–99)
Glucose-Capillary: 390 mg/dL — ABNORMAL HIGH (ref 70–99)
Glucose-Capillary: 356 mg/dL — ABNORMAL HIGH (ref 70–99)

## 2018-08-28 LAB — HIV ANTIBODY (ROUTINE TESTING W REFLEX): HIV Screen 4th Generation wRfx: NONREACTIVE

## 2018-08-28 LAB — HEMOGLOBIN A1C
Hgb A1c MFr Bld: 12.5 % — ABNORMAL HIGH (ref 4.8–5.6)
Mean Plasma Glucose: 312.05 mg/dL

## 2018-08-28 LAB — MRSA PCR SCREENING: MRSA by PCR: NEGATIVE

## 2018-08-28 MED ORDER — INSULIN ASPART 100 UNIT/ML ~~LOC~~ SOLN
0.0000 [IU] | Freq: Three times a day (TID) | SUBCUTANEOUS | Status: DC
  Administered 2018-08-28: 5 [IU] via SUBCUTANEOUS
  Administered 2018-08-28 (×2): 15 [IU] via SUBCUTANEOUS
  Administered 2018-08-29: 3 [IU] via SUBCUTANEOUS
  Administered 2018-08-29: 11 [IU] via SUBCUTANEOUS

## 2018-08-28 MED ORDER — INSULIN STARTER KIT- SYRINGES (ENGLISH)
1.0000 | Freq: Once | Status: AC
  Administered 2018-08-28: 1
  Filled 2018-08-28: qty 1

## 2018-08-28 MED ORDER — INSULIN GLARGINE 100 UNIT/ML ~~LOC~~ SOLN
35.0000 [IU] | Freq: Every day | SUBCUTANEOUS | Status: DC
  Administered 2018-08-28: 35 [IU] via SUBCUTANEOUS
  Filled 2018-08-28 (×2): qty 0.35

## 2018-08-28 MED ORDER — LEVOFLOXACIN 750 MG PO TABS
750.0000 mg | ORAL_TABLET | Freq: Every day | ORAL | Status: DC
  Administered 2018-08-28 – 2018-08-29 (×2): 750 mg via ORAL
  Filled 2018-08-28 (×2): qty 1

## 2018-08-28 MED ORDER — ACETAMINOPHEN 325 MG PO TABS
650.0000 mg | ORAL_TABLET | Freq: Four times a day (QID) | ORAL | Status: DC | PRN
  Administered 2018-08-28 – 2018-08-29 (×4): 650 mg via ORAL
  Filled 2018-08-28 (×4): qty 2

## 2018-08-28 MED ORDER — INSULIN DETEMIR 100 UNIT/ML ~~LOC~~ SOLN: 25.0000 [IU] | Freq: Two times a day (BID) | SUBCUTANEOUS | Status: DC

## 2018-08-28 MED ORDER — LACTATED RINGERS IV SOLN
INTRAVENOUS | Status: DC
  Administered 2018-08-28: 09:00:00 via INTRAVENOUS

## 2018-08-28 MED ORDER — POTASSIUM CHLORIDE CRYS ER 20 MEQ PO TBCR
40.0000 meq | EXTENDED_RELEASE_TABLET | Freq: Once | ORAL | Status: AC
  Administered 2018-08-28: 40 meq via ORAL
  Filled 2018-08-28: qty 2

## 2018-08-28 MED ORDER — INSULIN ASPART 100 UNIT/ML ~~LOC~~ SOLN
0.0000 [IU] | Freq: Every day | SUBCUTANEOUS | Status: DC
  Administered 2018-08-28: 2 [IU] via SUBCUTANEOUS

## 2018-08-28 MED ORDER — INSULIN ASPART 100 UNIT/ML ~~LOC~~ SOLN
4.0000 [IU] | Freq: Three times a day (TID) | SUBCUTANEOUS | Status: DC
  Administered 2018-08-28: 4 [IU] via SUBCUTANEOUS

## 2018-08-28 NOTE — Progress Notes (Signed)
PROGRESS NOTE                                                                                                                                                                                                             Patient Demographics:    [name], is a 33 y.o. female, DOB - Oct 05, 1985, OVZ:[id number]  Admit date - 08/27/2018   Admitting Physician Barb Merino, MD  Outpatient Primary MD for the patient is Forrest Moron, MD  LOS - 1  Chief Complaint  Patient presents with  . Dizziness  . Nausea  . Emesis       Brief Narrative  [name] is a 33 y.o. female with medical history significant of recently diagnosed type 2 diabetes, anxiety who presents to the emergency room with 2 days of nausea vomiting, abdominal pain, thirst and feeling profoundly weak, she was diagnosed 2 weeks with DM2, here diagnosed with DKA and admitted.   Subjective:    [name] today has, No headache, No chest pain, No abdominal pain - No Nausea, No new weakness tingling or numbness, No Cough - SOB. +ve R thigh pain.   Assessment  & Plan :     1. DKA in a DM2 patient - recently diagnosed DM type II, placed on Lantus along with sliding scale, diabetes education, monitor sugars, gap has closed and has been transitioned off IV fluids and insulin drip.  Appears stable now.  2. R.Thigh pain - area of induration, check [place], check MRSA PCR, for now PO Levaquin.    Family Communication  :  None  Code Status :  Full  Disposition Plan  :  Home  Consults  :  None  Procedures  :    R. Thigh [place] -   DVT Prophylaxis  :  Lovenox    Lab Results  Component Value Date   PLT 395 08/27/2018    Diet :  Diet Order            Diet Carb Modified Fluid consistency: Thin; Room service appropriate? Yes  Diet effective now               Inpatient Medications Scheduled Meds: . enoxaparin (LOVENOX) injection  40 mg Subcutaneous Q24H  . insulin aspart  0-15 Units  Subcutaneous TID WC  . insulin aspart  0-5 Units Subcutaneous QHS  . insulin aspart  4 Units Subcutaneous TID WC  . insulin glargine  35 Units Subcutaneous Daily  . insulin  starter kit- syringes  1 kit Other Once  . sodium chloride flush  3 mL Intravenous Once   Continuous Infusions: PRN Meds:.acetaminophen  Antibiotics  :   Anti-infectives (From admission, onward)   None          Objective:   Vitals:   08/27/18 0642 08/27/18 1955 08/27/18 2000 08/28/18 0537  BP:  116/77  119/80  Pulse: 94  88 88  Resp:  (!) 22 (!) 24 18  Temp:  99.1 F (37.3 C)  98.2 F (36.8 C)  TempSrc:  Oral  Oral  SpO2: 100%  100% 99%  Weight:      Height:        Wt Readings from Last 3 Encounters:  08/27/18 118.4 kg  07/17/18 129.3 kg  02/02/18 131.1 kg     Intake/Output Summary (Last 24 hours) at 08/28/2018 1320 Last data filed at 08/28/2018 0340 Gross per 24 hour  Intake 720 ml  Output -  Net 720 ml     Physical Exam  Awake Alert, Oriented X 3, No new F.N deficits, Normal affect [place].AT,PERRAL Supple Neck,No JVD, No cervical lymphadenopathy appriciated.  Symmetrical Chest wall movement, Good air movement bilaterally, CTAB RRR,No Gallops,Rubs or new Murmurs, No Parasternal Heave +ve B.Sounds, Abd Soft, No tenderness, No organomegaly appriciated, No rebound - guarding or rigidity. No Cyanosis, Clubbing or edema, No new Rash or bruise, R inner thigh induration - CNA Juve bedside    Data Review:    CBC Recent Labs  Lab 08/27/18 0140 08/27/18 0711  WBC 9.0  --   HGB 14.2 14.6  HCT 42.3 43.0  PLT 395  --   MCV 78.2*  --   MCH 26.2  --   MCHC 33.6  --   RDW 13.1  --     Chemistries  Recent Labs  Lab 08/27/18 0140  08/27/18 1116 08/27/18 1449 08/27/18 1834 08/27/18 2236 08/28/18 0324 08/28/18 0711  NA 133*   < > 136 136 135 135 133* 134*  K 4.4   < > 3.5 3.2* 3.7 3.1* 3.4* 3.5  CL 94*  --  103 107 102 104 100 103  CO2 19*  --  20* 22 21* '25 23 23  '$ GLUCOSE 386*   --  205* 135* 271* 259* 269* 222*  BUN 9  --  '6 6 7 7 7 '$ <5*  CREATININE 1.20*  --  0.81 0.78 0.83 0.89 0.83 0.70  CALCIUM 9.6  --  8.9 8.8* 8.6* 8.5* 8.4* 8.5*  MG  --   --  1.8  --   --   --   --   --   AST 12*  --   --   --   --   --   --   --   ALT 12  --   --   --   --   --   --   --   ALKPHOS 110  --   --   --   --   --   --   --   BILITOT 0.9  --   --   --   --   --   --   --    < > = values in this interval not displayed.   ------------------------------------------------------------------------------------------------------------------ No results for input(s): CHOL, HDL, LDLCALC, TRIG, CHOLHDL, LDLDIRECT in the last 72 hours.  Lab Results  Component Value Date   HGBA1C 12.5 (H) 08/28/2018   ------------------------------------------------------------------------------------------------------------------ No results for input(s):  TSH, T4TOTAL, T3FREE, THYROIDAB in the last 72 hours.  Invalid input(s): FREET3 ------------------------------------------------------------------------------------------------------------------ No results for input(s): VITAMINB12, FOLATE, FERRITIN, TIBC, IRON, RETICCTPCT in the last 72 hours.  Coagulation profile No results for input(s): INR, PROTIME in the last 168 hours.  No results for input(s): DDIMER in the last 72 hours.  Cardiac Enzymes No results for input(s): CKMB, TROPONINI, MYOGLOBIN in the last 168 hours.  Invalid input(s): CK ------------------------------------------------------------------------------------------------------------------ No results found for: BNP  Micro Results No results found for this or any previous visit (from the past 240 hour(s)).  Radiology Reports Dg Chest 2 View  Result Date: 08/27/2018 CLINICAL DATA:  Dizziness, nausea, vomiting and diarrhea starting yesterday. EXAM: CHEST - 2 VIEW COMPARISON:  Chest x-ray dated 02/06/2017. FINDINGS: The heart size and mediastinal contours are within normal limits.  Both lungs are clear. The visualized skeletal structures are unremarkable. IMPRESSION: No active cardiopulmonary disease. No evidence of pneumonia or pulmonary edema. Electronically Signed   By: Franki Cabot M.D.   On: 08/27/2018 07:52    Time Spent in minutes  30   Lala Lund M.D on 08/28/2018 at 1:20 PM  To page go to [email or web address] - password [facility]

## 2018-08-28 NOTE — Progress Notes (Signed)
Inpatient Diabetes Program Recommendations  AACE/ADA: New Consensus Statement on Inpatient Glycemic Control (2015)  Target Ranges:  Prepandial:   less than 140 mg/dL      Peak postprandial:   less than 180 mg/dL (1-2 hours)      Critically ill patients:  140 - 180 mg/dL   Results for KEARSTEN, GINTHER (MRN [id number]) as of 08/28/2018 10:21  Ref. Range 08/27/2018 08:06 08/27/2018 09:14 08/27/2018 10:35 08/27/2018 11:36 08/27/2018 12:37 08/27/2018 13:45 08/27/2018 15:00 08/27/2018 16:12 08/27/2018 17:53 08/27/2018 19:52  Glucose-Capillary Latest Ref Range: 70 - 99 mg/dL 274 (H) 244 (H) 198 (H) 183 (H) 191 (H) 181 (H) 125 (H) 97  IV Insulin Drip stopped 193 (H)  4 units NOVOLOG +  30 units LEVEMIR 338 (H)  16 units NOVOLOG    Results for KADIA, ABAYA (MRN [id number]) as of 08/28/2018 10:21  Ref. Range 08/27/2018 23:21 08/28/2018 03:34 08/28/2018 07:58  Glucose-Capillary Latest Ref Range: 70 - 99 mg/dL 228 (H)  8 units NOVOLOG  251 (H)  12 units NOVOLOG  216 (H)  5 units NOVOLOG    Results for TRUDA, STAUB (MRN [id number]) as of 08/28/2018 10:21  Ref. Range 02/02/2018 13:55 08/27/2018 11:16 08/28/2018 08:00  Hemoglobin A1C Latest Ref Range: 4.8 - 5.6 % 7.5 (A) 12.7 (H) 12.5 (H)  (312 mg/dl)    Admit with: DKA  History: DM (diagnosed June 2019)--Started on Metformin  Home DM Meds: Metformin 1000 mg BID  Current Orders: Novolog Moderate Correction Scale/ SSI (0-15 units) TID AC + HS     Levemir 30 units QHS      Diagnosed with diabetes in June 2019.  Started on Metformin 500 mg daily.  Met with the RD a the [facility] for Diabetes Self-Management education on 03/24/2018.  Looks like patient may have lost her health insurance coverage and has not seen her PCP (Dr. Delia Chimes with [facility]) since July of 2019.  Not sure patient has been taking her Metformin on a regular basis.  Will have nursing staff begin Insulin  education with pt in anticipation of patient needing insulin for home.  Diabetes Coordinator not physically present on campus over the weekend but is available by phone for questions and recommendations.      MD- Please consider the following in-hospital insulin adjustments:  1. Increase Levemir to 35 units QHS (fasting CBG still elevated this AM).  2. Start Novolog Meal Coverage: Novolog 4 units TID with meals  3. May consider switching patient to more affordable Insulin regimen like 70/30 Insulin BID--It does not appear that patient has health insurance and she will likely not be able to afford Levemir and Novolog (unless Care Management can get her established with the [facility])    --Will follow patient during hospitalization--  Wyn Quaker RN, MSN, CDE Diabetes Coordinator Inpatient Glycemic Control Team Team Pager: [phone] (8a-5p)

## 2018-08-29 LAB — BASIC METABOLIC PANEL
Anion gap: 10 (ref 5–15)
BUN: 7 mg/dL (ref 6–20)
CO2: 23 mmol/L (ref 22–32)
Calcium: 8.7 mg/dL — ABNORMAL LOW (ref 8.9–10.3)
Chloride: 100 mmol/L (ref 98–111)
Creatinine, Ser: 0.82 mg/dL (ref 0.44–1.00)
GFR calc Af Amer: >60 mL/min (ref >60)
GFR calc non Af Amer: >60 mL/min (ref >60)
Glucose, Bld: 212 mg/dL — ABNORMAL HIGH (ref 70–99)
Potassium: 3.8 mmol/L (ref 3.5–5.1)
Sodium: 133 mmol/L — ABNORMAL LOW (ref 135–145)

## 2018-08-29 LAB — GLUCOSE, CAPILLARY
Glucose-Capillary: 247 mg/dL — ABNORMAL HIGH (ref 70–99)
Glucose-Capillary: 179 mg/dL — ABNORMAL HIGH (ref 70–99)
Glucose-Capillary: 333 mg/dL — ABNORMAL HIGH (ref 70–99)

## 2018-08-29 MED ORDER — IBUPROFEN 600 MG PO TABS: 600.0000 mg | ORAL_TABLET | Freq: Three times a day (TID) | ORAL | 0 refills | Status: DC | PRN

## 2018-08-29 MED ORDER — INSULIN ASPART 100 UNIT/ML ~~LOC~~ SOLN: SUBCUTANEOUS | 0 refills | Status: DC

## 2018-08-29 MED ORDER — "INSULIN SYRINGE-NEEDLE U-100 25G X 1"" 1 ML MISC": 0 refills | Status: DC

## 2018-08-29 MED ORDER — ACETAMINOPHEN 500 MG PO TABS: 500.0000 mg | ORAL_TABLET | Freq: Three times a day (TID) | ORAL | 0 refills | Status: AC | PRN

## 2018-08-29 MED ORDER — LEVOFLOXACIN 750 MG PO TABS: 750.0000 mg | ORAL_TABLET | Freq: Every day | ORAL | 0 refills | Status: DC

## 2018-08-29 MED ORDER — INSULIN GLARGINE 100 UNIT/ML ~~LOC~~ SOLN
40.0000 [IU] | Freq: Every day | SUBCUTANEOUS | Status: DC
  Administered 2018-08-29: 40 [IU] via SUBCUTANEOUS
  Filled 2018-08-29: qty 0.4

## 2018-08-29 MED ORDER — INSULIN GLARGINE 100 UNIT/ML ~~LOC~~ SOLN: 35.0000 [IU] | Freq: Every day | SUBCUTANEOUS | 0 refills | Status: DC

## 2018-08-29 NOTE — Progress Notes (Signed)
Nsg Discharge Note  Admit Date:  08/27/2018 Discharge date: 08/29/2018   Nunzio Cory to be D/C'd Home per MD order.  AVS completed.  Copy for chart, and copy for patient signed, and dated. Patient/caregiver able to verbalize understanding.  Discharge Medication: Allergies as of 08/29/2018   No Known Allergies     Medication List    TAKE these medications   acetaminophen 500 MG tablet Commonly known as:  TYLENOL Take 1 tablet (500 mg total) by mouth every 8 (eight) hours as needed for moderate pain.   blood glucose meter kit and supplies Kit Dispense based on patient and insurance preference. Use up to four times daily as directed. (FOR ICD-10 E11.9).   etonogestrel 68 MG Impl implant Commonly known as:  NEXPLANON 1 each by Subdermal route once.   FREESTYLE LIBRE 14 DAY READER Devi 1 application by Does not apply route 2 (two) times daily.   FREESTYLE LIBRE 14 DAY SENSOR Misc 1 Device by Does not apply route every 14 (fourteen) days.   glucose blood test strip Use to check blood glucose twice daily. Use as instructed   ibuprofen 600 MG tablet Commonly known as:  ADVIL,MOTRIN Take 1 tablet (600 mg total) by mouth every 8 (eight) hours as needed for moderate pain.   insulin aspart 100 UNIT/ML injection Commonly known as:  NOVOLOG Substitute to any brand approved.Before each meal 3 times a day, 140-199 - 2 units, 200-250 - 4 units, 251-299 - 6 units,  300-349 - 8 units,  350 or above 10 units. Dispense syringes and needles as needed, Ok to switch to PEN if approved. DX DM2, Code E11.65   insulin glargine 100 UNIT/ML injection Commonly known as:  LANTUS Inject 0.35 mLs (35 Units total) into the skin at bedtime. Dispense insulin pen if approved, if not dispense as needed syringes and needles for 1 month supply. Can switch to Levemir. Diagnosis E 11.65.   Insulin Syringe-Needle U-100 25G X 1" 1 ML Misc For 4 times a day insulin SQ, 1 month supply. Diagnosis E11.65    Lancing Device Misc Use to check blood glucose twice daily.  E 11.9   levofloxacin 750 MG tablet Commonly known as:  LEVAQUIN Take 1 tablet (750 mg total) by mouth daily. Start taking on:  August 30, 2018   metFORMIN 1000 MG tablet Commonly known as:  GLUCOPHAGE Take 1 tablet (1,000 mg total) by mouth 2 (two) times daily with a meal.       Discharge Assessment: Vitals:   08/28/18 2148 08/29/18 0526  BP: 103/67 118/76  Pulse: 84 82  Resp: 20   Temp: 99.2 F (37.3 C) 98.2 F (36.8 C)  SpO2: 99% 100%   Skin clean, dry and intact without evidence of skin break down, no evidence of skin tears noted. IV catheter discontinued intact. Site without signs and symptoms of complications - no redness or edema noted at insertion site, patient denies c/o pain - only slight tenderness at site.  Dressing with slight pressure applied.  D/c Instructions-Education: Discharge instructions given to patient/family with verbalized understanding. D/c education completed with patient/family including follow up instructions, medication list, d/c activities limitations if indicated, with other d/c instructions as indicated by MD - patient able to verbalize understanding, all questions fully answered. Patient instructed to return to ED, call 911, or call MD for any changes in condition.  Patient escorted via [place], and D/C home via private auto.  Erasmo Leventhal, RN 08/29/2018 3:33 PM

## 2018-08-29 NOTE — Progress Notes (Signed)
Inpatient Diabetes Program Recommendations  AACE/ADA: New Consensus Statement on Inpatient Glycemic Control (2015)  Target Ranges:  Prepandial:   less than 140 mg/dL      Peak postprandial:   less than 180 mg/dL (1-2 hours)      Critically ill patients:  140 - 180 mg/dL   Lab Results  Component Value Date   GLUCAP 333 (H) 08/29/2018   HGBA1C 12.5 (H) 08/28/2018    Review of Glycemic Control Results for [name], [name] (MRN [id number]) as of 08/29/2018 12:47  Ref. Range 08/28/2018 16:38 08/28/2018 21:46 08/29/2018 07:51 08/29/2018 11:52  Glucose-Capillary Latest Ref Range: 70 - 99 mg/dL 356 (H) 247 (H) 179 (H) 333 (H)   Diabetes history: Type 2 DM Outpatient Diabetes medications: Metformin 1000 mg BID Current orders for Inpatient glycemic control: Lantus 40 units QD, Novolog 0-15 units TID, Novolog 0-5 units QHS  Inpatient Diabetes Program Recommendations:    Spoke with patient at length regarding DM management. Was recently diagnosed back in June 2019 and was in the process of making lifestyle changes. However, progress limited as patient has recently been treated at [facility] for severe depression related to relationship change at home.  Reviewed patient's current A1c of 12.5%, up from 7.5% back in June 2019. Explained what a A1c is and what it measures. Also reviewed goal A1c with patient, importance of good glucose control @ home, and blood sugar goals. Reviewed patho of DM, need for insulin, role of pancreas, short acting vs. Long acting insulin, survival skills, administration schedule, impact of infection/ stress on blood glucose trends, vascular changes and comorbidites.   Patient needs a meter at discharge. Blood glucose meter (includes lancets and strips) ([id number]) already placed in discharge summary. Encouraged to begin checking blood glucose 3 times per day, writing values down and taking meter with her to next PCP appointment. Discussed importance of watching total carb intake,  limiting foods that are higher in carbs and eliminating sugary beverages from diet. Patient had started walking occasionally and recently joined a gym. Patient plans to increase amount of activity per week. Placed consult for dietitian and provided information on [facility] free outpatient education classes. Stressed the importance of close follow up with PCP and when to call MD. Plan to follow up with [facility].  Educated patient on insulin pen use at home. Reviewed contents of insulin flexpen starter kit. Reviewed all steps if insulin pen including attachment of needle, 2-unit air shot, dialing up dose, giving injection, removing needle, disposal of sharps, storage of unused insulin, disposal of insulin etc. Patient able to provide successful return demonstration. Also reviewed troubleshooting with insulin pen. MD to give patient Rxs for insulin pens and insulin pen needles.  Thanks, Bronson Curb, MSN, RNC-OB Diabetes Coordinator [phone] (8a-5p)

## 2018-08-29 NOTE — Discharge Summary (Signed)
[name] CNO:[id number] DOB: 1986-07-15 DOA: 08/27/2018  PCP: Forrest Moron, MD  Admit date: 08/27/2018  Discharge date: 08/29/2018  Admitted From: Home   Disposition:  Home   Recommendations for Outpatient Follow-up:   Follow up with PCP in 1-2 weeks  PCP Please obtain BMP/CBC, 2 view CXR in 1week,  (see Discharge instructions)   PCP Please follow up on the following pending results: Monitor [place]: None   Equipment/Devices: None  Consultations: None Discharge Condition: Stable   CODE STATUS: Ful   Diet Recommendation: Low Carb    Chief Complaint  Patient presents with  . Dizziness  . Nausea  . Emesis     Brief history of present illness from the day of admission and additional interim summary    [name] a 33 y.o.femalewith medical history significant ofrecently diagnosed type 2 diabetes, anxiety who presents to the emergency room with 2 days of nausea vomiting, abdominal pain, thirst and feeling profoundly weak, she was diagnosed 2 weeks with DM2, here diagnosed with DKA and admitted.                                                                 Hospital Course      1. DKA in a DM2 patient - recently diagnosed DM type II, treated with DKA protocol and now DKA has resolved, has been transitioned to Lantus along with sliding scale, diabetic and insulin education provided, continue home dose Glucophage upon discharge along with Lantus and sliding scale and follow with PCP within a week.  Lab Results  Component Value Date   HGBA1C 12.5 (H) 08/28/2018   CBG (last 3)  Recent Labs    08/28/18 1638 08/28/18 2146 08/29/18 0751  GLUCAP 356* 247* 179*    2. R.Thigh pain - area of induration, ultrasound negative for any abscess, no fluctuance on exam either,  afebrile, MRSA PCR negative, 5 days of oral Levaquin and outpatient general surgery follow-up, may develop abscess later.   Discharge diagnosis     Principal Problem:   DKA, type 2, not at goal [facility]) Active Problems:   Asthma, mild intermittent   Anxiety   DKA (diabetic ketoacidoses) [facility])    Discharge instructions    Discharge Instructions    Discharge instructions   Complete by:  As directed    Follow with Primary MD Forrest Moron, MD in 7 days   Get CBC, CMP  checked  by Primary MD   in 5-7 days    Activity: As tolerated with Full fall precautions use walker/cane & assistance as needed  Disposition Home    Diet: Low Carb  Accuchecks 4 times/day, Once in AM empty stomach and then before each meal. Log in all results and show them to your  Prim.MD in 3 days. If any glucose reading is under 80 or above 300 call your Prim MD immidiately. Follow Low glucose instructions for glucose under 80 as instructed.  Special Instructions: If you have smoked or chewed Tobacco  in the last 2 yrs please stop smoking, stop any regular Alcohol  and or any Recreational drug use.  On your next visit with your primary care physician please Get Medicines reviewed and adjusted.  Please request your Prim.MD to go over all Hospital Tests and Procedure/Radiological results at the follow up, please get all Hospital records sent to your Prim MD by signing hospital release before you go home.  If you experience worsening of your admission symptoms, develop shortness of breath, life threatening emergency, suicidal or homicidal thoughts you must seek medical attention immediately by calling 911 or calling your MD immediately  if symptoms less severe.  You Must read complete instructions/literature along with all the possible adverse reactions/side effects for all the Medicines you take and that have been prescribed to you. Take any new Medicines after you have completely understood and accpet all the  possible adverse reactions/side effects.   Increase activity slowly   Complete by:  As directed       Discharge Medications   Allergies as of 08/29/2018   No Known Allergies     Medication List    TAKE these medications   acetaminophen 500 MG tablet Commonly known as:  TYLENOL Take 1 tablet (500 mg total) by mouth every 8 (eight) hours as needed for moderate pain.   blood glucose meter kit and supplies Kit Dispense based on patient and insurance preference. Use up to four times daily as directed. (FOR ICD-10 E11.9).   etonogestrel 68 MG Impl implant Commonly known as:  NEXPLANON 1 each by Subdermal route once.   FREESTYLE LIBRE 14 DAY READER Devi 1 application by Does not apply route 2 (two) times daily.   FREESTYLE LIBRE 14 DAY SENSOR Misc 1 Device by Does not apply route every 14 (fourteen) days.   glucose blood test strip Use to check blood glucose twice daily. Use as instructed   ibuprofen 600 MG tablet Commonly known as:  ADVIL,MOTRIN Take 1 tablet (600 mg total) by mouth every 8 (eight) hours as needed for moderate pain.   insulin aspart 100 UNIT/ML injection Commonly known as:  NOVOLOG Substitute to any brand approved.Before each meal 3 times a day, 140-199 - 2 units, 200-250 - 4 units, 251-299 - 6 units,  300-349 - 8 units,  350 or above 10 units. Dispense syringes and needles as needed, Ok to switch to PEN if approved. DX DM2, Code E11.65   insulin glargine 100 UNIT/ML injection Commonly known as:  LANTUS Inject 0.35 mLs (35 Units total) into the skin at bedtime. Dispense insulin pen if approved, if not dispense as needed syringes and needles for 1 month supply. Can switch to Levemir. Diagnosis E 11.65.   Insulin Syringe-Needle U-100 25G X 1" 1 ML Misc For 4 times a day insulin SQ, 1 month supply. Diagnosis E11.65   Lancing Device Misc Use to check blood glucose twice daily.  E 11.9   levofloxacin 750 MG tablet Commonly known as:  LEVAQUIN Take 1 tablet  (750 mg total) by mouth daily. Start taking on:  August 30, 2018   metFORMIN 1000 MG tablet Commonly known as:  GLUCOPHAGE Take 1 tablet (1,000 mg total) by mouth 2 (two) times daily with a meal.  Follow-up Information    Forrest Moron, MD. Schedule an appointment as soon as possible for a visit in 1 week(s).   Specialty:  Internal Medicine Contact information: [place] [address] [phone]        [facility], [place]. Schedule an appointment as soon as possible for a visit in 3 day(s).   Specialty:  General Surgery Why:  R. Thigh infection Contact information: [address] [place] [phone]          Major procedures and Radiology Reports - PLEASE review detailed and final reports thoroughly  -         Dg Chest 2 View  Result Date: 08/27/2018 CLINICAL DATA:  Dizziness, nausea, vomiting and diarrhea starting yesterday. EXAM: CHEST - 2 VIEW COMPARISON:  Chest x-ray dated 02/06/2017. FINDINGS: The heart size and mediastinal contours are within normal limits. Both lungs are clear. The visualized skeletal structures are unremarkable. IMPRESSION: No active cardiopulmonary disease. No evidence of pneumonia or pulmonary edema. Electronically Signed   By: Franki Cabot M.D.   On: 08/27/2018 07:52   [place] Soft Tissue Non Vascular  Result Date: 08/28/2018 CLINICAL DATA:  Evaluate for abscess of right thigh. Right inner thigh pain for 1 week with swelling. EXAM: ULTRASOUND RIGHT LOWER EXTREMITY LIMITED TECHNIQUE: Ultrasound examination of the lower extremity soft tissues was performed in the area of clinical concern. COMPARISON:  None. FINDINGS: Location of ultrasound: Right groin/right upper inner thigh area. Soft tissue mass: No Cyst or complex fluid collection: No Comments: In the area of concern there is soft tissue swelling, skin thickening and subcutaneous edema. No underlying  mass or focal fluid collection identified. A prominent lymph node is identified measuring 1.3 cm short axis. This may be reactive. IMPRESSION: 1. Right groin soft tissue swelling with skin thickening and subcutaneous edema. No abscess identified 2. Enlarged right inguinal lymph node which may be reactive in etiology. Electronically Signed   By: Kerby Moors M.D.   On: 08/28/2018 14:29    Micro Results     Recent Results (from the past 240 hour(s))  MRSA PCR Screening     Status: None   Collection Time: 08/28/18  3:15 PM  Result Value Ref Range Status   MRSA by PCR NEGATIVE NEGATIVE Final    Comment:        The GeneXpert MRSA Assay (FDA approved for NASAL specimens only), is one component of a comprehensive MRSA colonization surveillance program. It is not intended to diagnose MRSA infection nor to guide or monitor treatment for MRSA infections. Performed at [facility], [place] [address] [place], [place] [address]     Today   Subjective    [name] today has no headache,no chest abdominal pain,no new weakness tingling or numbness, feels much better wants to go home today.     Objective   Blood pressure 118/76, pulse 82, temperature 98.2 F (36.8 C), temperature source Oral, resp. rate 20, height _0  (1.727 m), weight 118.4 kg, SpO2 100 %, unknown if currently breastfeeding.   Intake/Output Summary (Last 24 hours) at 08/29/2018 0929 Last data filed at 08/28/2018 2159 Gross per 24 hour  Intake 757.16 ml  Output -  Net 757.16 ml    Exam Awake Alert, Oriented x 3, No new F.N deficits, Normal affect [place].AT,PERRAL Supple Neck,No JVD, No cervical lymphadenopathy appriciated.  Symmetrical Chest wall movement, Good air movement bilaterally, CTAB RRR,No Gallops,Rubs  or new Murmurs, No Parasternal Heave +ve B.Sounds, Abd Soft, Non tender, No organomegaly appriciated, No rebound -guarding or rigidity. No Cyanosis, Clubbing or edema, No new Rash or bruise, mild R  thigh induration   Data Review   CBC w Diff:  Lab Results  Component Value Date   WBC 9.0 08/27/2018   HGB 14.6 08/27/2018   HGB 12.6 01/10/2018   HCT 43.0 08/27/2018   HCT 38.4 01/10/2018   PLT 395 08/27/2018   PLT 317 01/10/2018   LYMPHOPCT 36 08/05/2018   BANDSPCT 0 01/08/2016   MONOPCT 4 08/05/2018   EOSPCT 1 08/05/2018   BASOPCT 1 08/05/2018    CMP:  Lab Results  Component Value Date   NA 133 (L) 08/29/2018   NA 137 01/10/2018   K 3.8 08/29/2018   CL 100 08/29/2018   CO2 23 08/29/2018   BUN 7 08/29/2018   BUN 7 01/10/2018   CREATININE 0.82 08/29/2018   CREATININE 0.70 08/07/2015   PROT 8.1 08/27/2018   PROT 7.1 01/10/2018   ALBUMIN 4.3 08/27/2018   ALBUMIN 4.4 01/10/2018   BILITOT 0.9 08/27/2018   BILITOT 0.4 01/10/2018   ALKPHOS 110 08/27/2018   AST 12 (L) 08/27/2018   ALT 12 08/27/2018  .   Total Time in preparing paper work, data evaluation and todays exam - 57 minutes  Lala Lund M.D on 08/29/2018 at 9:29 AM  [facility]   Office  [phone]

## 2018-08-29 NOTE — Discharge Instructions (Signed)
Follow with Primary MD Forrest Moron, MD in 7 days   Get CBC, CMP  checked  by Primary MD   in 5-7 days    Activity: As tolerated with Full fall precautions use walker/cane & assistance as needed  Disposition Home    Diet: Low Carb  Accuchecks 4 times/day, Once in AM empty stomach and then before each meal. Log in all results and show them to your Prim.MD in 3 days. If any glucose reading is under 80 or above 300 call your Prim MD immidiately. Follow Low glucose instructions for glucose under 80 as instructed.  Special Instructions: If you have smoked or chewed Tobacco  in the last 2 yrs please stop smoking, stop any regular Alcohol  and or any Recreational drug use.  On your next visit with your primary care physician please Get Medicines reviewed and adjusted.  Please request your Prim.MD to go over all Hospital Tests and Procedure/Radiological results at the follow up, please get all Hospital records sent to your Prim MD by signing hospital release before you go home.  If you experience worsening of your admission symptoms, develop shortness of breath, life threatening emergency, suicidal or homicidal thoughts you must seek medical attention immediately by calling 911 or calling your MD immediately  if symptoms less severe.  You Must read complete instructions/literature along with all the possible adverse reactions/side effects for all the Medicines you take and that have been prescribed to you. Take any new Medicines after you have completely understood and accpet all the possible adverse reactions/side effects.

## 2018-08-31 ENCOUNTER — Other Ambulatory Visit: Payer: Self-pay

## 2018-08-31 ENCOUNTER — Emergency Department (HOSPITAL_COMMUNITY)
Admission: EM | Admit: 2018-08-31 | Discharge: 2018-08-31 | Disposition: A | Discharge status: Home / Self Care | Payer: Medicaid Other | Attending: Emergency Medicine | Admitting: Emergency Medicine

## 2018-08-31 ENCOUNTER — Encounter (HOSPITAL_COMMUNITY): Payer: Self-pay | Admitting: Emergency Medicine

## 2018-08-31 ENCOUNTER — Emergency Department (HOSPITAL_COMMUNITY): Payer: Medicaid Other

## 2018-08-31 DIAGNOSIS — Z87891 Personal history of nicotine dependence: Secondary | ICD-10-CM | POA: Insufficient documentation

## 2018-08-31 DIAGNOSIS — Z794 Long term (current) use of insulin: Secondary | ICD-10-CM | POA: Diagnosis not present

## 2018-08-31 DIAGNOSIS — J45909 Unspecified asthma, uncomplicated: Secondary | ICD-10-CM | POA: Diagnosis not present

## 2018-08-31 DIAGNOSIS — L03115 Cellulitis of right lower limb: Secondary | ICD-10-CM | POA: Diagnosis not present

## 2018-08-31 DIAGNOSIS — Z79899 Other long term (current) drug therapy: Secondary | ICD-10-CM | POA: Diagnosis not present

## 2018-08-31 DIAGNOSIS — E119 Type 2 diabetes mellitus without complications: Secondary | ICD-10-CM | POA: Diagnosis not present

## 2018-08-31 DIAGNOSIS — M79651 Pain in right thigh: Secondary | ICD-10-CM | POA: Diagnosis present

## 2018-08-31 HISTORY — DX: Type 2 diabetes mellitus without complications: E11.9

## 2018-08-31 LAB — BASIC METABOLIC PANEL
Anion gap: 10 (ref 5–15)
BUN: <5 mg/dL — ABNORMAL LOW (ref 6–20)
CO2: 25 mmol/L (ref 22–32)
Calcium: 9.2 mg/dL (ref 8.9–10.3)
Chloride: 100 mmol/L (ref 98–111)
Creatinine, Ser: 0.74 mg/dL (ref 0.44–1.00)
GFR calc Af Amer: >60 mL/min (ref >60)
GFR calc non Af Amer: >60 mL/min (ref >60)
Glucose, Bld: 268 mg/dL — ABNORMAL HIGH (ref 70–99)
Potassium: 3.8 mmol/L (ref 3.5–5.1)
Sodium: 135 mmol/L (ref 135–145)

## 2018-08-31 LAB — CBC WITH DIFFERENTIAL/PLATELET
Abs Immature Granulocytes: 0.03 10*3/uL (ref 0.00–0.07)
Basophils Absolute: 0 10*3/uL (ref 0.0–0.1)
Basophils Relative: 0 %
Eosinophils Absolute: 0.1 10*3/uL (ref 0.0–0.5)
Eosinophils Relative: 1 %
HCT: 38.2 % (ref 36.0–46.0)
Hemoglobin: 12.5 g/dL (ref 12.0–15.0)
Immature Granulocytes: 0 %
Lymphocytes Relative: 30 %
Lymphs Abs: 2.3 10*3/uL (ref 0.7–4.0)
MCH: 25.9 pg — ABNORMAL LOW (ref 26.0–34.0)
MCHC: 32.7 g/dL (ref 30.0–36.0)
MCV: 79.3 fL — ABNORMAL LOW (ref 80.0–100.0)
Monocytes Absolute: 0.4 10*3/uL (ref 0.1–1.0)
Monocytes Relative: 6 %
Neutro Abs: 4.8 10*3/uL (ref 1.7–7.7)
Neutrophils Relative %: 63 %
Platelets: 331 10*3/uL (ref 150–400)
RBC: 4.82 MIL/uL (ref 3.87–5.11)
RDW: 13.2 % (ref 11.5–15.5)
WBC: 7.7 10*3/uL (ref 4.0–10.5)
nRBC: 0 % (ref 0.0–0.2)

## 2018-08-31 LAB — CBG MONITORING, ED: Glucose-Capillary: 247 mg/dL — ABNORMAL HIGH (ref 70–99)

## 2018-08-31 LAB — I-STAT BETA HCG BLOOD, ED (MC, WL, AP ONLY): I-stat hCG, quantitative: <5 m[IU]/mL (ref <5)

## 2018-08-31 MED ORDER — SULFAMETHOXAZOLE-TRIMETHOPRIM 800-160 MG PO TABS: 1.0000 | ORAL_TABLET | Freq: Two times a day (BID) | ORAL | 0 refills | Status: AC

## 2018-08-31 MED ORDER — SODIUM CHLORIDE 0.9 % IV BOLUS
1000.0000 mL | Freq: Once | INTRAVENOUS | Status: AC
  Administered 2018-08-31: 1000 mL via INTRAVENOUS

## 2018-08-31 MED ORDER — CEFAZOLIN SODIUM-DEXTROSE 1-4 GM/50ML-% IV SOLN
1.0000 g | Freq: Once | INTRAVENOUS | Status: AC
  Administered 2018-08-31: 1 g via INTRAVENOUS
  Filled 2018-08-31: qty 50

## 2018-08-31 MED ORDER — IOHEXOL 300 MG/ML  SOLN
100.0000 mL | Freq: Once | INTRAMUSCULAR | Status: AC | PRN
  Administered 2018-08-31: 100 mL via INTRAVENOUS

## 2018-08-31 MED ORDER — CEPHALEXIN 500 MG PO CAPS: 500.0000 mg | ORAL_CAPSULE | Freq: Four times a day (QID) | ORAL | 0 refills | Status: DC

## 2018-08-31 MED ORDER — ONDANSETRON 4 MG PO TBDP
4.0000 mg | ORAL_TABLET | Freq: Once | ORAL | Status: AC
  Administered 2018-08-31: 4 mg via ORAL
  Filled 2018-08-31: qty 1

## 2018-08-31 NOTE — ED Provider Notes (Signed)
[facility] EMERGENCY DEPARTMENT Provider Note   CSN: [id number] Arrival date & time: 08/31/18  1512     History   Chief Complaint Chief Complaint  Patient presents with  . Abscess    HPI [name] is a 33 y.o. female.  33 year old female presents with worsening right thigh infection.  Patient states she was admitted to the hospital January 25-27 for DKA, found to have cellulitis of her right proximal medial thigh.  Patient has an ultrasound completed at that time which did not show abscess, was discharged home with Levaquin p.o. x5 days.  Patient reports she has had clear/bloody drainage from the area, and has a fluid blister on her thigh, area is painful.  Denies rapidly progressing pain or infection in her leg.  Patient states area started with a small bump on her thigh about 2 weeks ago, states this was not like normal bumps that she has had on her leg previously.  Patient is insulin-dependent type 2 diabetic, MRSA PCR negative on her hospital admission.  Denies fevers or chills, no other complaints or concerns.     Past Medical History:  Diagnosis Date  . Asthma   . Diabetes mellitus without complication ([place])   . 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 approx 3-4cm at time of 01/2016 c-section  . Herpes   . Trichomonas vaginitis     Patient Active Problem List   Diagnosis Date Noted  . Anxiety 08/27/2018  . DKA, type 2, not at goal [facility]) 08/27/2018  . DKA (diabetic ketoacidoses) ([place]) 08/27/2018  . New persistent daily headache 01/10/2018  . Uterine leiomyoma 01/10/2018  . Family history of diabetes mellitus in mother 01/10/2018  . Paresthesia of both feet 01/10/2018  . Visual field scotoma of both eyes 01/10/2018  . Other headache syndrome 01/10/2018  . Newly diagnosed diabetes ([place]) 01/10/2018  . S/P primary low transverse C-section 01/08/2016  . Asthma, mild intermittent 08/14/2015  . Sickle cell trait ([place]) 09/01/2014  .  Morbid (severe) obesity due to excess calories ([place]) 04/28/2012    Past Surgical History:  Procedure Laterality Date  . CESAREAN SECTION N/A 01/08/2016   Procedure: CESAREAN SECTION;  Surgeon: Aletha Halim, MD;  Location: [facility];  Service: Obstetrics;  Laterality: N/A;  . DILATION AND CURETTAGE OF UTERUS       OB History    Gravida  4   Para  3   Term  3   Preterm      AB  1   Living  3     SAB  1   TAB      Ectopic      Multiple  0   Live Births  3            Home Medications    Prior to Admission medications   Medication Sig Start Date End Date Taking? Authorizing Provider  acetaminophen (TYLENOL) 500 MG tablet Take 1 tablet (500 mg total) by mouth every 8 (eight) hours as needed for moderate pain. 08/29/18 08/29/19  Thurnell Lose, MD  blood glucose meter kit and supplies KIT Dispense based on patient and insurance preference. Use up to four times daily as directed. (FOR ICD-10 E11.9). 01/17/18   Forrest Moron, MD  cephALEXin (KEFLEX) 500 MG capsule Take 1 capsule (500 mg total) by mouth 4 (four) times daily. 08/31/18   Tacy Learn, PA-C  Continuous Blood Gluc Receiver (FREESTYLE LIBRE 14 DAY READER) DEVI  1 application by Does not apply route 2 (two) times daily. 01/17/18   Forrest Moron, MD  Continuous Blood Gluc Sensor (FREESTYLE LIBRE 14 DAY SENSOR) MISC 1 Device by Does not apply route every 14 (fourteen) days. 01/17/18   Forrest Moron, MD  etonogestrel (NEXPLANON) 68 MG IMPL implant 1 each by Subdermal route once.    [provider]  glucose blood test strip Use to check blood glucose twice daily. Use as instructed 01/17/18   Forrest Moron, MD  ibuprofen (ADVIL,MOTRIN) 600 MG tablet Take 1 tablet (600 mg total) by mouth every 8 (eight) hours as needed for moderate pain. 08/29/18   Thurnell Lose, MD  insulin aspart (NOVOLOG) 100 UNIT/ML injection Substitute to any brand approved.Before each meal 3 times a day, 140-199 - 2  units, 200-250 - 4 units, 251-299 - 6 units,  300-349 - 8 units,  350 or above 10 units. Dispense syringes and needles as needed, Ok to switch to PEN if approved. DX DM2, Code E11.65 08/29/18   Thurnell Lose, MD  insulin glargine (LANTUS) 100 UNIT/ML injection Inject 0.35 mLs (35 Units total) into the skin at bedtime. Dispense insulin pen if approved, if not dispense as needed syringes and needles for 1 month supply. Can switch to Levemir. Diagnosis E 11.65. 08/29/18   Thurnell Lose, MD  Insulin Syringe-Needle U-100 25G X 1" 1 ML MISC For 4 times a day insulin SQ, 1 month supply. Diagnosis E11.65 08/29/18   Thurnell Lose, MD  Lancet Devices (LANCING DEVICE) MISC Use to check blood glucose twice daily.  E 11.9 01/17/18   Forrest Moron, MD  metFORMIN (GLUCOPHAGE) 1000 MG tablet Take 1 tablet (1,000 mg total) by mouth 2 (two) times daily with a meal. 08/05/18   Hayden Rasmussen, MD  sulfamethoxazole-trimethoprim (BACTRIM DS,SEPTRA DS) 800-160 MG tablet Take 1 tablet by mouth 2 (two) times daily for 10 days. 08/31/18 09/10/18  Tacy Learn, PA-C    Family History Family History  Problem Relation Age of Onset  . Diabetes Mother   . Hypertension Mother   . Hyperlipidemia Mother   . Heart disease Mother   . Diabetes Sister   . Diabetes Brother     Social History Social History   Tobacco Use  . Smoking status: Former Smoker    Types: Cigarettes  . Smokeless tobacco: Never Used  . Tobacco comment: quit 2013  Substance Use Topics  . Alcohol use: No    Comment: occasional, not with preg  . Drug use: No     Allergies   Patient has no known allergies.   Review of Systems Review of Systems  Constitutional: Negative for chills and fever.  Gastrointestinal: Negative for vomiting.  Musculoskeletal: Positive for myalgias.  Skin: Positive for color change.  Allergic/Immunologic: Positive for immunocompromised state.  Neurological: Negative for weakness.  Hematological: Negative  for adenopathy.  Psychiatric/Behavioral: Negative for confusion.  All other systems reviewed and are negative.    Physical Exam Updated Vital Signs BP 108/66 (BP Location: Right Arm)   Pulse 85   Temp 98.2 F (36.8 C) (Oral)   Resp 16   SpO2 100%   Physical Exam Vitals signs and nursing note reviewed.  Constitutional:      General: She is not in acute distress.    Appearance: She is well-developed. She is not diaphoretic.  HENT:     Head: Normocephalic and atraumatic.  Cardiovascular:     Pulses: Normal  pulses.  Pulmonary:     Effort: Pulmonary effort is normal.  Musculoskeletal:        General: Tenderness present.       Legs:  Skin:    General: Skin is warm.     Findings: Erythema present.  Neurological:     Mental Status: She is alert and oriented to person, place, and time.  Psychiatric:        Behavior: Behavior normal.      ED Treatments / Results  Labs (all labs ordered are listed, but only abnormal results are displayed) Labs Reviewed  CBC WITH DIFFERENTIAL/PLATELET - Abnormal; Notable for the following components:      Result Value   MCV 79.3 (*)    MCH 25.9 (*)    All other components within normal limits  BASIC METABOLIC PANEL - Abnormal; Notable for the following components:   Glucose, Bld 268 (*)    BUN <5 (*)    All other components within normal limits  CBG MONITORING, ED - Abnormal; Notable for the following components:   Glucose-Capillary 247 (*)    All other components within normal limits  I-STAT BETA HCG BLOOD, ED ([facility], [facility], [facility] ONLY)    EKG None  Radiology Ct Hip Right W Contrast  Result Date: 08/31/2018 CLINICAL DATA:  Possible right upper leg abscess. EXAM: CT OF THE LOWER RIGHT EXTREMITY WITH CONTRAST TECHNIQUE: Multidetector CT imaging of the lower right extremity was performed according to the standard protocol following intravenous contrast administration. COMPARISON:  None. CONTRAST:  100 mL of Omnipaque 300 intravenously.  FINDINGS: Visualized bowel loops are unremarkable. Uterus and adnexal regions are unremarkable except for small calcified uterine fibroid. Urinary bladder is decompressed. No definite osseous abnormality is noted. Stranding of the medial subcutaneous tissues of the proximal right thigh is noted consistent with cellulitis. No definite abscess or defined fluid collection is noted. IMPRESSION: Findings consistent with cellulitis involving the medial soft tissues of the proximal right thigh. No definite abscess or fluid collection is noted. Electronically Signed   By: Marijo Conception, M.D.   On: 08/31/2018 21:14    Procedures Procedures (including critical care time)  Medications Ordered in ED Medications  sodium chloride 0.9 % bolus 1,000 mL (0 mLs Intravenous Stopped 08/31/18 1903)  ceFAZolin (ANCEF) IVPB 1 g/50 mL premix (0 g Intravenous Stopped 08/31/18 1839)  iohexol (OMNIPAQUE) 300 MG/ML solution 100 mL (100 mLs Intravenous Contrast Given 08/31/18 2019)  ondansetron (ZOFRAN-ODT) disintegrating tablet 4 mg (4 mg Oral Given 08/31/18 2153)     Initial Impression / Assessment and Plan / ED Course  I have reviewed the triage vital signs and the nursing notes.  Pertinent labs & imaging results that were available during my care of the patient were reviewed by me and considered in my medical decision making (see chart for details).  Clinical Course as of Aug 31 2328  Wed Aug 31, 5542  8359 33 year old female presents with worsening cellulitis in her right medial proximal thigh area.  Patient was recently admitted to the hospital for DKA, given Levaquin for cellulitis of her right thigh.  Patient has been taking the Levaquin, is not improving, states her blood sugars better controlled at home with insulin.  On exam patient has cellulitis of the medial proximal right thigh with 1 single large bullae with serous drainage.  Patient seen by Dr. Ralene Bathe, ER attending, agrees with plan of care for CT to rule out  deep space infection.  Lab work including  pregnancy test, CBC, BMP without significant findings, glucose mildly elevated at 268.  She was given IV fluids and Ancef IV.  Advised patient to discontinue the Levaquin, given prescriptions for Keflex and Septra recommend warm compresses and patient should recheck with her PCP in 1-2 days.  She was given strict return to ER precautions including fever, any worsening pain, increase in redness or any other concerns.  Recheck of her leg prior to discharge, erythema and induration have not increased in size, the large bulla has since ruptured and drained.   [LM]    Clinical Course User Index [LM] Tacy Learn, PA-C   Final Clinical Impressions(s) / ED Diagnoses   Final diagnoses:  Cellulitis of right lower extremity    ED Discharge Orders         Ordered    cephALEXin (KEFLEX) 500 MG capsule  4 times daily     08/31/18 2229    sulfamethoxazole-trimethoprim (BACTRIM DS,SEPTRA DS) 800-160 MG tablet  2 times daily     08/31/18 2229           Roque Lias 08/31/18 2330    Quintella Reichert, MD 09/05/18 [phone]

## 2018-08-31 NOTE — ED Notes (Signed)
Called lab to ask lab about blood sent down at 1707 that is not in process. Lab sts "It just came down the tube station, I'll process it now."

## 2018-08-31 NOTE — ED Notes (Signed)
CBG results of 247 reported to RN, Judson Roch.

## 2018-08-31 NOTE — Discharge Instructions (Addendum)
Warm compresses to right thigh area for 30 minutes at a time. Stop taking the Levaquin prescribed while you are in the hospital. Take Keflex and Septra as prescribed and complete the full course.   Recheck with your doctor in the next 1 to 2 days, return to the ER for any new or worsening symptoms.

## 2018-08-31 NOTE — ED Notes (Signed)
Patient transported to CT 

## 2018-08-31 NOTE — ED Triage Notes (Addendum)
C/o abscess to R upper leg  X 1 1/2 weeks.  Denies fever and chills.

## 2018-09-07 NOTE — Progress Notes (Signed)
Patient ID: [name], female   DOB: 05/27/1986, 33 y.o.   MRN: [id number]      [name], is a 33 y.o. female  CSN:[id number]  MRN:[id number]  DOB - 07/16/1986  Subjective:  Chief Complaint and HPI: [name] is a 33 y.o. female here today to establish care and for a follow up visit After recent hospitalization and ED visit 08/31/2018.  She is doing much better.  She got the septra filled but not the cephalexin bc she couldn't afford both.  No f/c.  Blood sugars running bt 100-200.  She is working on changing her diet.  Pain improving.  She got her insulin filled and is checking blood sugars regularly.   She feels like she is getting a yeast infection from all the antibiotics.    From ED note: 32-year-old female presents with worsening right thigh infection.  Patient states she was admitted to the hospital January 25-27 for DKA, found to have cellulitis of her right proximal medial thigh.  Patient has an ultrasound completed at that time which did not show abscess, was discharged home with Levaquin p.o. x5 days.  Patient reports she has had clear/bloody drainage from the area, and has a fluid blister on her thigh, area is painful.  Denies rapidly progressing pain or infection in her leg.  Patient states area started with a small bump on her thigh about 2 weeks ago, states this was not like normal bumps that she has had on her leg previously.  Patient is insulin-dependent type 2 diabetic, MRSA PCR negative on her hospital admission.  Denies fevers or chills, no other complaints or concerns.  From A/P: 32-year-old female presents with worsening cellulitis in her right medial proximal thigh area.  Patient was recently admitted to the hospital for DKA, given Levaquin for cellulitis of her right thigh.  Patient has been taking the Levaquin, is not improving, states her blood sugars better controlled at home with insulin.  On exam patient has cellulitis of the medial proximal right  thigh with 1 single large bullae with serous drainage.  Patient seen by Dr. Rees, ER attending, agrees with plan of care for CT to rule out deep space infection.  Lab work including pregnancy test, CBC, BMP without significant findings, glucose mildly elevated at 268.  She was given IV fluids and Ancef IV.  Advised patient to discontinue the Levaquin, given prescriptions for Keflex and Septra recommend warm compresses and patient should recheck with her PCP in 1-2 days.  She was given strict return to ER precautions including fever, any worsening pain, increase in redness or any other concerns.  Recheck of her leg prior to discharge, erythema and induration have not increased in size, the large bulla has since ruptured and drained.   ED/Hospital notes reviewed.   Family history: brother, mom and sister with DM. Mom with heart dz, HLD, htn.  Dad and brother deceased    ROS:   Constitutional:  No f/c, No night sweats, No unexplained weight loss. EENT:  No vision changes, No blurry vision, No hearing changes. No mouth, throat, or ear problems.  Respiratory: No cough, No SOB Cardiac: No CP, no palpitations GI:  No abd pain, No N/V/D. GU: No Urinary s/sx Musculoskeletal: No joint pain Neuro: No headache, no dizziness, no motor weakness.  Skin: No rash Endocrine:  No polydipsia. No polyuria.  Psych: Denies SI/HI  No problems updated.  ALLERGIES: No Known Allergies  PAST MEDICAL HISTORY: Past Medical History:  Diagnosis   Date  . Asthma   . Diabetes mellitus without complication ([facility])   . 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 approx 3-4cm at time of 01/2016 c-section  . Herpes   . Trichomonas vaginitis     MEDICATIONS AT HOME: Prior to Admission medications   Medication Sig Start Date End Date Taking? Authorizing Provider  acetaminophen (TYLENOL) 500 MG tablet Take 1 tablet (500 mg total) by mouth every 8 (eight) hours as needed for moderate pain. 08/29/18 08/29/19 Yes Singh,  Prashant K, MD  blood glucose meter kit and supplies KIT Dispense based on patient and insurance preference. Use up to four times daily as directed. (FOR ICD-10 E11.9). 01/17/18  Yes Stallings, Zoe A, MD  cephALEXin (KEFLEX) 500 MG capsule Take 1 capsule (500 mg total) by mouth 4 (four) times daily. 08/31/18  Yes Murphy, Laura A, PA-C  Continuous Blood Gluc Receiver (FREESTYLE LIBRE 14 DAY READER) DEVI 1 application by Does not apply route 2 (two) times daily. 01/17/18  Yes Stallings, Zoe A, MD  Continuous Blood Gluc Sensor (FREESTYLE LIBRE 14 DAY SENSOR) MISC 1 Device by Does not apply route every 14 (fourteen) days. 01/17/18  Yes Stallings, Zoe A, MD  etonogestrel (NEXPLANON) 68 MG IMPL implant 1 each by Subdermal route once.   Yes [provider]  glucose blood test strip Use to check blood glucose twice daily. Use as instructed 01/17/18  Yes Stallings, Zoe A, MD  ibuprofen (ADVIL,MOTRIN) 600 MG tablet Take 1 tablet (600 mg total) by mouth every 8 (eight) hours as needed for moderate pain. 08/29/18  Yes Singh, Prashant K, MD  insulin aspart (NOVOLOG) 100 UNIT/ML injection Substitute to any brand approved.Before each meal 3 times a day, 140-199 - 2 units, 200-250 - 4 units, 251-299 - 6 units,  300-349 - 8 units,  350 or above 10 units. Dispense syringes and needles as needed, Ok to switch to PEN if approved. DX DM2, Code E11.65 08/29/18  Yes Singh, Prashant K, MD  insulin glargine (LANTUS) 100 UNIT/ML injection Inject 0.35 mLs (35 Units total) into the skin at bedtime. Dispense insulin pen if approved, if not dispense as needed syringes and needles for 1 month supply. Can switch to Levemir. Diagnosis E 11.65. 08/29/18  Yes Singh, Prashant K, MD  Insulin Syringe-Needle U-100 25G X 1" 1 ML MISC For 4 times a day insulin SQ, 1 month supply. Diagnosis E11.65 08/29/18  Yes Singh, Prashant K, MD  Lancet Devices (LANCING DEVICE) MISC Use to check blood glucose twice daily.  E 11.9 01/17/18  Yes Stallings, Zoe  A, MD  metFORMIN (GLUCOPHAGE) 1000 MG tablet Take 1 tablet (1,000 mg total) by mouth 2 (two) times daily with a meal. 08/05/18  Yes Butler, Michael C, MD  fluconazole (DIFLUCAN) 150 MG tablet Take 1 tablet (150 mg total) by mouth once for 1 dose. Now and 1 in 1 week 09/08/18 09/08/18  McClung, Angela M, PA-C  sulfamethoxazole-trimethoprim (BACTRIM DS,SEPTRA DS) 800-160 MG tablet Take 1 tablet by mouth 2 (two) times daily for 10 days. Patient not taking: Reported on 09/08/2018 08/31/18 09/10/18  Murphy, Laura A, PA-C     Objective:  EXAM:   Vitals:   09/08/18 1045  BP: 117/81  Pulse: 69  Temp: 98.1 F (36.7 C)  TempSrc: Oral  SpO2: 100%  Weight: 269 lb 3.2 oz (122.1 kg)  Height: 5' 8" (1.727 m)    General appearance : A&OX3. NAD. Non-toxic-appearing HEENT: Atraumatic and Normocephalic.  PERRLA. EOM   intact.  Neck: supple, no JVD. No cervical lymphadenopathy. No thyromegaly Chest/Lungs:  Breathing-non-labored, Good air entry bilaterally, breath sounds normal without rales, rhonchi, or wheezing  CVS: S1 S2 regular, no murmurs, gallops, rubs  R upper inner thigh-dressing removed-healing by tertiary intention.  Erythema waning, no fluctuance, no induration.  Dressed with reverse maxi pad and 2 ACE wraps. Extremities: Bilateral Lower Ext shows no edema, both legs are warm to touch with = pulse throughout Neurology:  CN II-XII grossly intact, Non focal.   Psych:  TP linear. J/I WNL. Normal speech. Appropriate eye contact and affect.  Skin:  No Rash  Data Review Lab Results  Component Value Date   HGBA1C 12.5 (H) 08/28/2018   HGBA1C 12.7 (H) 08/27/2018   HGBA1C 7.5 (A) 02/02/2018     Assessment & Plan   1. Abscess of right thigh Wound check and redressing-much improved/healing.  Finish antibiotics - fluconazole (DIFLUCAN) 150 MG tablet; Take 1 tablet (150 mg total) by mouth once for 1 dose. Now and 1 in 1 week  Dispense: 2 tablet; Refill: 0  2. DKA, type 2, not at goal [facility]) Improving.   Check blood sugar fasting and bedtime and record and bring to next visit.  Continue current regimen - Glucose (CBG) - fluconazole (DIFLUCAN) 150 MG tablet; Take 1 tablet (150 mg total) by mouth once for 1 dose. Now and 1 in 1 week  Dispense: 2 tablet; Refill: 0  3. Morbid (severe) obesity due to excess calories ([facility]) Weight loss recommended  4. Encounter for examination following treatment at hospital Much improved.    Patient have been counseled extensively about nutrition and exercise  Return in about 3 weeks (around 09/29/2018) for assign PCP f/up thigh abscess and DM.  The patient was given clear instructions to go to ER or return to medical center if symptoms don't improve, worsen or new problems develop. The patient verbalized understanding. The patient was told to call to get lab results if they haven't heard anything in the next week.     Freeman Caldron, PA-C [facility] and [facility] [place], [place]   09/08/2018, 11:15 AM

## 2018-09-08 ENCOUNTER — Ambulatory Visit: Payer: Self-pay | Attending: Family Medicine | Admitting: Physician Assistant

## 2018-09-08 VITALS — BP 117/81 | HR 69 | Temp 98.1°F | Ht 68.0 in | Wt 269.2 lb

## 2018-09-08 DIAGNOSIS — Z09 Encounter for follow-up examination after completed treatment for conditions other than malignant neoplasm: Secondary | ICD-10-CM

## 2018-09-08 DIAGNOSIS — E1110 Type 2 diabetes mellitus with ketoacidosis without coma: Secondary | ICD-10-CM

## 2018-09-08 DIAGNOSIS — L02415 Cutaneous abscess of right lower limb: Secondary | ICD-10-CM

## 2018-09-08 LAB — GLUCOSE, POCT (MANUAL RESULT ENTRY): POC Glucose: 100 mg/dl — AB (ref 70–99)

## 2018-09-08 MED ORDER — FLUCONAZOLE 150 MG PO TABS: 150.0000 mg | ORAL_TABLET | Freq: Once | ORAL | 0 refills | Status: AC

## 2018-09-08 NOTE — Patient Instructions (Signed)
Check blood sugars fasting and at bedtime and record.  Bring to next visit.    Clean the abscess area with soap and water daily then cover with non-stick dressing.  Finish your antibiotics.

## 2018-09-19 ENCOUNTER — Encounter: Payer: Self-pay | Admitting: Physician Assistant

## 2018-10-04 ENCOUNTER — Encounter: Payer: Self-pay | Admitting: Family Medicine

## 2018-10-04 ENCOUNTER — Ambulatory Visit: Payer: Self-pay | Attending: Family Medicine | Admitting: Family Medicine

## 2018-10-04 VITALS — BP 118/76 | HR 82 | Temp 98.1°F | Ht 68.0 in | Wt 268.0 lb

## 2018-10-04 DIAGNOSIS — E1165 Type 2 diabetes mellitus with hyperglycemia: Secondary | ICD-10-CM

## 2018-10-04 DIAGNOSIS — E119 Type 2 diabetes mellitus without complications: Secondary | ICD-10-CM

## 2018-10-04 LAB — GLUCOSE, POCT (MANUAL RESULT ENTRY): POC Glucose: 113 mg/dL — AB (ref 70–99)

## 2018-10-04 MED ORDER — METFORMIN HCL 1000 MG PO TABS: 1000.0000 mg | ORAL_TABLET | Freq: Two times a day (BID) | ORAL | 3 refills | Status: DC

## 2018-10-04 MED ORDER — INSULIN GLARGINE 100 UNIT/ML ~~LOC~~ SOLN: 20.0000 [IU] | Freq: Every day | SUBCUTANEOUS | 0 refills | Status: DC
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
## 2018-10-06 ENCOUNTER — Encounter: Payer: Self-pay | Admitting: Family Medicine

## 2018-10-14 ENCOUNTER — Telehealth: Payer: Self-pay | Admitting: Licensed Clinical Social Worker

## 2018-10-14 NOTE — Telephone Encounter (Signed)
LCSWA attempted to contact pt to follow up on behavioral health screens from prior appointment. Voicemail was full; therefore, LCSWA was unable to leave a message.

## 2018-10-25 ENCOUNTER — Encounter: Payer: Self-pay | Admitting: Family Medicine

## 2018-10-27 ENCOUNTER — Telehealth: Payer: Self-pay | Admitting: Licensed Clinical Social Worker

## 2018-10-27 NOTE — Social Work (Signed)
Call placed to patient. LCSWA introduced self and explained role at [facility]. Pt was informed of behavioral health consult from PCP.   Pt reportedly was diagnosed with Major Depression Disorder "a couple of months ago". She recently established psychotherapy and medication management through [facility]. She visits with her therapist once a month. Pt feels that symptoms of depression fluctuates on a week by week basis. Denies any current suicidal/homicidal ideations noting minor children as protective factors. She was successful in identifying triggers, such as, chronic medical conditions and pain. LCSWA provided validation and encouragement. Pt had good insight on how mental and physical health correlate with one another.   LCSWA discussed healthy coping skills to assist in the management and/or decrease of symptoms. Pt was encouraged to apply for financial counseling to assist with difficulty affording medications and/or accessing specialists. Supportive resources to assist patient with healthy food choices and weight management were mailed, per patient request. No additional concerns noted.

## 2018-11-03 ENCOUNTER — Other Ambulatory Visit: Payer: Self-pay

## 2018-11-03 ENCOUNTER — Ambulatory Visit: Payer: Self-pay | Attending: Family Medicine | Admitting: Family Medicine

## 2018-11-03 ENCOUNTER — Encounter: Payer: Self-pay | Admitting: Family Medicine

## 2018-11-03 DIAGNOSIS — Z9114 Patient's other noncompliance with medication regimen: Secondary | ICD-10-CM

## 2018-11-03 DIAGNOSIS — J4520 Mild intermittent asthma, uncomplicated: Secondary | ICD-10-CM

## 2018-11-03 DIAGNOSIS — Z91148 Patient's other noncompliance with medication regimen for other reason: Secondary | ICD-10-CM

## 2018-11-03 DIAGNOSIS — E1165 Type 2 diabetes mellitus with hyperglycemia: Secondary | ICD-10-CM

## 2018-11-03 MED ORDER — ALBUTEROL SULFATE HFA 108 (90 BASE) MCG/ACT IN AERS: 2.0000 | INHALATION_SPRAY | Freq: Four times a day (QID) | RESPIRATORY_TRACT | 2 refills | Status: DC | PRN

## 2018-11-03 MED ORDER — METFORMIN HCL ER 500 MG PO TB24: ORAL_TABLET | ORAL | 3 refills | Status: DC

## 2018-11-03 NOTE — Progress Notes (Signed)
Patient verified DOB Patient has not taken medication. Patient has eaten today. Patient noticed difficulty breathing noticed when overeating. Patient complains of cold symptoms with a nasal allergy sensation.  Patient denies cough.  Patient complains of 1 loose stool on yesterday. Patient checked her sugar level was 90 around 8 am prior to eating.

## 2018-11-03 NOTE — Progress Notes (Signed)
Virtual Visit via Telephone Note  I connected with [name] on 11/03/18 at 11:17 am EST by telephone and verified that I am speaking with the correct person using two identifiers.  Due to limitiations/restrictions on in-office visits due to the COVID-19 pandemic, this scheduled office visit was converted to a tele-health visit   I discussed the limitations, risks, security and privacy concerns of performing an evaluation and management service by telephone and the availability of in person appointments. I also discussed with the patient that there may be a patient responsible charge related to this service. The patient expressed understanding and agreed to proceed.    History of Present Illness:      33 yo female who is seen in follow-up of chronic medical issues including uncontrolled type 2 diabetes with last Hgb A1c of 12.5 on 08/28/2018. She reports that she is not taking any of her diabetes medications at this time.  She reports recent blood sugars have been as low as 1 50-1 80.  Patient does not check her blood sugar daily.  She denies any current issues with increased thirst or urinary frequency.  She denies any numbness or tingling in her feet.      Patient has had some recent increase in nonproductive cough and mild sensation of chest tightness.  Patient does have history of asthma.  Patient thinks that symptoms may be related to recent increase in pollen.  Patient does not currently have an albuterol inhaler at home but would like to have a prescription to see if this helps with her current symptoms.   Past Medical History:  Diagnosis Date  . Asthma   . Diabetes mellitus without complication ([place])   . 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 approx 3-4cm at time of 01/2016 c-section  . Herpes   . Trichomonas vaginitis    Past Surgical History:  Procedure Laterality Date  . CESAREAN SECTION N/A 01/08/2016   Procedure: CESAREAN SECTION;  Surgeon: Aletha Halim, MD;   Location: [place];  Service: Obstetrics;  Laterality: N/A;  . DILATION AND CURETTAGE OF UTERUS     Family History  Problem Relation Age of Onset  . Diabetes Mother   . Hypertension Mother   . Hyperlipidemia Mother   . Heart disease Mother   . Diabetes Sister   . Diabetes Brother    Social History   Tobacco Use  . Smoking status: Former Smoker    Types: Cigarettes  . Smokeless tobacco: Never Used  . Tobacco comment: quit 2013  Substance Use Topics  . Alcohol use: No    Comment: occasional, not with preg  . Drug use: No   No Known Allergies    Review of Systems  Constitutional: Negative for chills and fever.  HENT: Positive for congestion. Negative for sore throat.   Eyes: Negative for blurred vision and double vision.  Respiratory: Positive for cough. Negative for shortness of breath and wheezing.   Cardiovascular: Negative for chest pain and palpitations.  Gastrointestinal: Negative for abdominal pain, constipation, diarrhea, heartburn and nausea.  Genitourinary: Negative for dysuria and frequency.  Musculoskeletal: Negative for joint pain and myalgias.  Neurological: Negative for dizziness and headaches.  Endo/Heme/Allergies: Positive for environmental allergies (pollen). Negative for polydipsia. Does not bruise/bleed easily.        Observations/Objective: No vital signs or physical examination performed at today's visit as visit was done via telephone   Assessment and Plan: 1. Uncontrolled type 2 diabetes mellitus with  hyperglycemia ([place]) Patient continues to remain non-complaint with her use of medications for the treatment of diabetes and reports that she is currently not taking any medications. Discussed having patient at least take once per day metformin XR at a dose of 1,000 mg once daily after her evening meal. Stressed importance of monitoring her blood sugars as well as a low carbohydrate diet and exercise. Discussed Hgb A1c goal of 7.0 as well as the  importance of controlling blood sugar in order to  - metFORMIN (GLUCOPHAGE-XR) 500 MG 24 hr tablet; Take 2 tablets once per day after the evening meal or once daily after breakfast  Dispense: 180 tablet; Refill: 3  2. Mild intermittent asthma without complication RX being sent to patient's pharmacy for albuterol HFA as patient with recent mild increase in SOB/cough due to pollen - albuterol (PROVENTIL HFA;VENTOLIN HFA) 108 (90 Base) MCG/ACT inhaler; Inhale 2 puffs into the lungs every 6 (six) hours as needed for wheezing or shortness of breath.  Dispense: 1 Inhaler; Refill: 2  3. Noncompliance with medications Patient continues to be non-compliant with the use medications for the treatment of her diabetes and discussed the increased risk of long term complications due to her uncontrolled diabetes.   Follow Up Instructions:   I discussed the assessment and treatment plan with the patient. The patient was provided an opportunity to ask questions and all were answered. The patient agreed with the plan and demonstrated an understanding of the instructions.   The patient was advised to call back or seek an in-person evaluation if the symptoms worsen or if the condition fails to improve as anticipated.  I provided 11 minutes of non-face-to-face time during this encounter.   Antony Blackbird, MD

## 2018-11-07 ENCOUNTER — Encounter: Payer: Self-pay | Admitting: Family Medicine

## 2018-12-08 ENCOUNTER — Encounter: Payer: Self-pay | Admitting: Family Medicine

## 2018-12-18 ENCOUNTER — Encounter: Payer: Self-pay | Admitting: Family Medicine

## 2018-12-19 ENCOUNTER — Other Ambulatory Visit: Payer: Self-pay | Admitting: Family Medicine

## 2018-12-19 DIAGNOSIS — A6000 Herpesviral infection of urogenital system, unspecified: Secondary | ICD-10-CM

## 2018-12-19 MED ORDER — VALACYCLOVIR HCL 500 MG PO TABS: ORAL_TABLET | ORAL | 6 refills | Status: DC

## 2018-12-19 NOTE — Progress Notes (Signed)
Patient ID: [name], female   DOB: 06-Jan-1986, 33 y.o.   MRN: [id number]   My chart message received from patient regarding the need for valtrex due to an acute genital herpes outbreak. Will send in RX for Valtrex 500 mg twice daily x 3 days

## 2018-12-19 NOTE — Telephone Encounter (Signed)
Refill request via mychart.

## 2018-12-27 ENCOUNTER — Encounter: Payer: Self-pay | Admitting: Family Medicine

## 2018-12-27 NOTE — Telephone Encounter (Signed)
Cream request in addition to oral treatmen via mychart

## 2018-12-29 ENCOUNTER — Ambulatory Visit: Payer: Self-pay | Attending: Family Medicine | Admitting: Physician Assistant

## 2018-12-29 ENCOUNTER — Other Ambulatory Visit: Payer: Self-pay

## 2018-12-29 ENCOUNTER — Other Ambulatory Visit (HOSPITAL_COMMUNITY)
Admission: RE | Admit: 2018-12-29 | Discharge: 2018-12-29 | Disposition: A | Discharge status: Home / Self Care | Payer: Medicaid Other | Source: Ambulatory Visit | Attending: Family Medicine | Admitting: Family Medicine

## 2018-12-29 VITALS — BP 141/80 | HR 101 | Temp 98.6°F | Ht 68.0 in | Wt 264.0 lb

## 2018-12-29 DIAGNOSIS — A6000 Herpesviral infection of urogenital system, unspecified: Secondary | ICD-10-CM | POA: Insufficient documentation

## 2018-12-29 DIAGNOSIS — E1165 Type 2 diabetes mellitus with hyperglycemia: Secondary | ICD-10-CM

## 2018-12-29 DIAGNOSIS — Z9119 Patient's noncompliance with other medical treatment and regimen: Secondary | ICD-10-CM

## 2018-12-29 DIAGNOSIS — E119 Type 2 diabetes mellitus without complications: Secondary | ICD-10-CM

## 2018-12-29 DIAGNOSIS — N912 Amenorrhea, unspecified: Secondary | ICD-10-CM

## 2018-12-29 DIAGNOSIS — Z91199 Patient's noncompliance with other medical treatment and regimen due to unspecified reason: Secondary | ICD-10-CM

## 2018-12-29 LAB — POCT GLYCOSYLATED HEMOGLOBIN (HGB A1C): Hemoglobin A1C: 11.2 % — AB (ref 4.0–5.6)

## 2018-12-29 LAB — POCT URINALYSIS DIP (CLINITEK)
Bilirubin, UA: NEGATIVE
Glucose, UA: 500 mg/dL — AB
Nitrite, UA: NEGATIVE
POC PROTEIN,UA: NEGATIVE
Spec Grav, UA: <=1.005 — AB (ref 1.010–1.025)
Urobilinogen, UA: 0.2 E.U./dL
pH, UA: 5.5 (ref 5.0–8.0)

## 2018-12-29 LAB — GLUCOSE, POCT (MANUAL RESULT ENTRY)
POC Glucose: 445 mg/dl — AB (ref 70–99)
POC Glucose: 490 mg/dl — AB (ref 70–99)

## 2018-12-29 LAB — POCT URINE PREGNANCY: Preg Test, Ur: NEGATIVE

## 2018-12-29 MED ORDER — INSULIN GLARGINE 100 UNIT/ML ~~LOC~~ SOLN: 20.0000 [IU] | Freq: Every day | SUBCUTANEOUS | 0 refills | Status: DC

## 2018-12-29 MED ORDER — VALACYCLOVIR HCL 500 MG PO TABS: ORAL_TABLET | ORAL | 6 refills | Status: DC

## 2018-12-29 MED ORDER — VALACYCLOVIR HCL 500 MG PO TABS: 500.0000 mg | ORAL_TABLET | Freq: Every day | ORAL | 1 refills | Status: DC

## 2018-12-29 MED ORDER — FLUCONAZOLE 150 MG PO TABS: 150.0000 mg | ORAL_TABLET | Freq: Once | ORAL | 0 refills | Status: AC

## 2018-12-29 MED ORDER — INSULIN ASPART 100 UNIT/ML ~~LOC~~ SOLN
25.0000 [IU] | Freq: Once | SUBCUTANEOUS | Status: AC
  Administered 2018-12-29: 25 [IU] via SUBCUTANEOUS

## 2018-12-29 NOTE — Patient Instructions (Addendum)
Check blood sugars twice daily and record and bring to next visit.  Drink more water.  Eliminate sugar and white carbohydrates.

## 2018-12-29 NOTE — Progress Notes (Signed)
Patient ID: [name], female   DOB: 05-28-1986, 33 y.o.   MRN: [id number]       [name], is a 33 y.o. female  RCB:[id number]  OEH:[id number]  DOB - 03-13-1986  Subjective:  Chief Complaint and HPI: [name] is a 33 y.o. female here today to Diagnosed with genital herpes in 2014.  also having vaginal itching.  Wants to get back on preventative valtrex bc sexually active again and partner is not infected.  She is currently having a bad outbreak.  No discharge.  Lesions are bleeding.  LMP was bout 5 weeks ago.  Last time she had an outbreak she was pregnant, so she is also concerned about that.  No fever.  not taking any insulin that has been prescribed.  Not compliant with diabetic diet.  Doesn't drink water.  Patient not checking blood sugars.  She "feels fine" so says that her sugars must be ok.  Some increased thirst  Social-has BF, She admits to stress and is currently in the process of moving   ROS:   Constitutional:  No f/c, No night sweats, No unexplained weight loss. EENT:  No vision changes, No blurry vision, No hearing changes. No mouth, throat, or ear problems.  Respiratory: No cough, No SOB Cardiac: No CP, no palpitations GI:  No abd pain, No N/V/D. GU: No Urinary s/sx Musculoskeletal: No joint pain Neuro: No headache, no dizziness, no motor weakness.  Skin: No rash Endocrine:  mild polydipsia. No polyuria.  Psych: Denies SI/HI  No problems updated.  ALLERGIES: No Known Allergies  PAST MEDICAL HISTORY: Past Medical History:  Diagnosis Date  . Asthma   . Diabetes mellitus without complication ([place])   . 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 approx 3-4cm at time of 01/2016 c-section  . Herpes   . Trichomonas vaginitis     MEDICATIONS AT HOME: Prior to Admission medications   Medication Sig Start Date End Date Taking? Authorizing Provider  acetaminophen (TYLENOL) 500 MG tablet Take 1 tablet (500 mg total) by mouth every 8  (eight) hours as needed for moderate pain. 08/29/18 08/29/19 Yes Thurnell Lose, MD  albuterol (PROVENTIL HFA;VENTOLIN HFA) 108 (90 Base) MCG/ACT inhaler Inhale 2 puffs into the lungs every 6 (six) hours as needed for wheezing or shortness of breath. 11/03/18  Yes Fulp, Cammie, MD  blood glucose meter kit and supplies KIT Dispense based on patient and insurance preference. Use up to four times daily as directed. (FOR ICD-10 E11.9). 01/17/18  Yes Forrest Moron, MD  Continuous Blood Gluc Receiver (FREESTYLE LIBRE 14 DAY READER) DEVI 1 application by Does not apply route 2 (two) times daily. 01/17/18  Yes Stallings, Zoe A, MD  Continuous Blood Gluc Sensor (FREESTYLE LIBRE 14 DAY SENSOR) MISC 1 Device by Does not apply route every 14 (fourteen) days. 01/17/18  Yes Forrest Moron, MD  etonogestrel (NEXPLANON) 68 MG IMPL implant 1 each by Subdermal route once.   Yes [provider]  glucose blood test strip Use to check blood glucose twice daily. Use as instructed 01/17/18  Yes Stallings, Zoe A, MD  ibuprofen (ADVIL,MOTRIN) 600 MG tablet Take 1 tablet (600 mg total) by mouth every 8 (eight) hours as needed for moderate pain. 08/29/18  Yes Thurnell Lose, MD  Insulin Syringe-Needle U-100 25G X 1" 1 ML MISC For 4 times a day insulin SQ, 1 month supply. Diagnosis E11.65 08/29/18  Yes Thurnell Lose, MD  Lancet Devices (LANCING  DEVICE) MISC Use to check blood glucose twice daily.  E 11.9 01/17/18  Yes Stallings, Zoe A, MD  metFORMIN (GLUCOPHAGE-XR) 500 MG 24 hr tablet Take 2 tablets once per day after the evening meal or once daily after breakfast 11/03/18  Yes Fulp, Cammie, MD  valACYclovir (VALTREX) 500 MG tablet One tablet twice per day for 3 days as needed for acute outbreaks 12/29/18  Yes Laycie Schriner M, PA-C  fluconazole (DIFLUCAN) 150 MG tablet Take 1 tablet (150 mg total) by mouth once for 1 dose. 12/29/18 12/29/18  Argentina Donovan, PA-C  insulin glargine (LANTUS) 100 UNIT/ML injection Inject  0.2 mLs (20 Units total) into the skin at bedtime. Can stop Lantus if fasting blood sugars are 80 or less for 2 or more days 12/29/18   Argentina Donovan, PA-C  valACYclovir (VALTREX) 500 MG tablet Take 1 tablet (500 mg total) by mouth daily. For prevention 12/29/18   Argentina Donovan, PA-C     Objective:  EXAM:   Vitals:   12/29/18 0848  BP: (!) 141/80  Pulse: (!) 101  Temp: 98.6 F (37 C)  TempSrc: Oral  SpO2: 99%  Weight: 264 lb (119.7 kg)  Height: '5\' 8"'$  (1.727 m)    General appearance : A&OX3. NAD. Non-toxic-appearing HEENT: Atraumatic and Normocephalic.  PERRLA. EOM intact.  TChest/Lungs:  Breathing-non-labored, Good air entry bilaterally, breath sounds normal without rales, rhonchi, or wheezing  CVS: S1 S2 regular, no murmurs, gallops, rubs  GU:  External genitalia with lesions into inguinal folds.  No secondary cellulitst noted Extremities: Bilateral Lower Ext shows no edema, both legs are warm to touch with = pulse throughout Neurology:  CN II-XII grossly intact, Non focal.   Psych:  TP linear. J/I WNL. Normal speech. Appropriate eye contact and affect.  Skin:  No Rash  Data Review Lab Results  Component Value Date   HGBA1C 11.2 (A) 12/29/2018   HGBA1C 12.5 (H) 08/28/2018   HGBA1C 12.7 (H) 08/27/2018     Assessment & Plan   1. Uncontrolled type 2 diabetes mellitus with hyperglycemia (HCC) Uncontrolled.  Must resume lantus.  Take metformin.  Check blood sugars bid and record and bring to f/up in 3 weeks.  Spent >30 mins discussing diet/compliance/medication and the Risks assc with her not taking her meds/uncontrolled blood sugar.  Eliminate sugar and white carbs.  Drink 8-10 cups water daily.   - Glucose (CBG) - HgB A1c - POCT URINALYSIS DIP (CLINITEK) - insulin aspart (novoLOG) injection 25 Units - insulin glargine (LANTUS) 100 UNIT/ML injection; Inject 0.2 mLs (20 Units total) into the skin at bedtime. Can stop Lantus if fasting blood sugars are 80 or less for 2  or more days  Dispense: 10 mL; Refill: 0 - Glucose (CBG)  2. Amenorrhea - POCT urine pregnancy-negative  3. Newly diagnosed diabetes ([place]) - insulin glargine (LANTUS) 100 UNIT/ML injection; Inject 0.2 mLs (20 Units total) into the skin at bedtime. Can stop Lantus if fasting blood sugars are 80 or less for 2 or more days  Dispense: 10 mL; Refill: 0  4. Genital herpes simplex, unspecified site - valACYclovir (VALTREX) 500 MG tablet; Take 1 tablet (500 mg total) by mouth daily. For prevention  Dispense: 90 tablet; Refill: 1 - valACYclovir (VALTREX) 500 MG tablet; One tablet twice per day for 3 days as needed for acute outbreaks  Dispense: 14 tablet; Refill: 6 - fluconazole (DIFLUCAN) 150 MG tablet; Take 1 tablet (150 mg total) by mouth once for 1 dose.  Dispense: 1 tablet; Refill: 0 - Urine cytology ancillary only  5. Non-compliance See #1  Patient have been counseled extensively about nutrition and exercise  Return in about 1 month (around 01/29/2019) for keep appt 6/17 with Dr Chapman Fitch.  The patient was given clear instructions to go to ER or return to medical center if symptoms don't improve, worsen or new problems develop. The patient verbalized understanding. The patient was told to call to get lab results if they haven't heard anything in the next week.     Freeman Caldron, PA-C [facility] [place], [place]   12/29/2018, 9:40 AM

## 2018-12-29 NOTE — Progress Notes (Signed)
Pt. stated she think she may have genital herpes.

## 2018-12-30 LAB — URINE CYTOLOGY ANCILLARY ONLY
Chlamydia: NEGATIVE
Neisseria Gonorrhea: NEGATIVE
Trichomonas: NEGATIVE

## 2018-12-31 LAB — URINE CYTOLOGY ANCILLARY ONLY: Candida vaginitis: POSITIVE — AB

## 2019-01-01 ENCOUNTER — Other Ambulatory Visit: Payer: Self-pay | Admitting: Physician Assistant

## 2019-01-01 MED ORDER — FLUCONAZOLE 150 MG PO TABS: ORAL_TABLET | ORAL | 0 refills | Status: DC

## 2019-01-02 ENCOUNTER — Telehealth: Payer: Self-pay | Admitting: Emergency Medicine

## 2019-01-02 NOTE — Telephone Encounter (Addendum)
Nurse called the patient's home phone number but received no answer and message was left on the voicemail for the patient to call back.  Return phone number given.  Dr Margarita Rana if you could please review result as I only have one result addressed.  Patient has high sugar and poor A1C.  Not sure if this needs to be reviewed and any intervention.

## 2019-01-02 NOTE — Telephone Encounter (Signed)
From review of PCP notes and recent office visit notes, she has a history of noncompliance and has not been taking her medications-Lantus and metformin.  Please advised to commence medications due to significantly elevated A1c of 11.2 and the risk of diabetic complications with noncompliance.

## 2019-01-02 NOTE — Telephone Encounter (Signed)
Patient called back please follow up.

## 2019-01-03 NOTE — Telephone Encounter (Signed)
Patient contacted via phone to be given results of labs.  Patient identified by name and date of birth.  Patient given results of labs.  Patient educated on lab results. Questions answered. Patient acknowledged understanding of labs results.  Call forwarded to CCP for review and connection.

## 2019-01-03 NOTE — Telephone Encounter (Signed)
Nurse called the patient's home phone number but received no answer and message was left on the voicemail for the patient to call back.  Return phone number given. 

## 2019-01-04 NOTE — Telephone Encounter (Signed)
Call returned to patient - we will meet for insulin technique teaching and medication compliance Friday.

## 2019-01-06 ENCOUNTER — Ambulatory Visit: Payer: Self-pay | Attending: Family Medicine | Admitting: Pharmacist

## 2019-01-06 ENCOUNTER — Encounter: Payer: Self-pay | Admitting: Pharmacist

## 2019-01-06 ENCOUNTER — Other Ambulatory Visit: Payer: Self-pay

## 2019-01-06 DIAGNOSIS — E1165 Type 2 diabetes mellitus with hyperglycemia: Secondary | ICD-10-CM

## 2019-01-06 LAB — GLUCOSE, POCT (MANUAL RESULT ENTRY): POC Glucose: 267 mg/dL — AB (ref 70–99)

## 2019-01-06 MED ORDER — METFORMIN HCL 500 MG PO TABS: 1000.0000 mg | ORAL_TABLET | Freq: Two times a day (BID) | ORAL | 2 refills | Status: DC

## 2019-01-06 MED ORDER — DULAGLUTIDE 0.75 MG/0.5ML ~~LOC~~ SOAJ: 0.7500 mg | SUBCUTANEOUS | 2 refills | Status: DC

## 2019-01-06 NOTE — Progress Notes (Signed)
    S:    PCP: Dr. Chapman Fitch   HPI:  33 YO female dx w T2 in June, 2019. A1c was 7.5. Of note, pt lost to f/u ~ 4months. Presented to ED 08/05/18 & again on 08/27/18 with hyperglycemia and DKA. Lantus was added to her regimen.   Dr. Chapman Fitch saw her again in 10/2018 and placed the pt on Lantus 25 units and continued metformin. In April, pt reported non-compliance to regimen. Patient was most recently seen 12/29/18 with A1c of 11 and glucose of 490. Reported non-compliance. Medications were reordered.   No chief complaint on file.  Patient arrives today in good spirits. Presents for diabetes evaluation, education, and management at the request of Dr. Chapman Fitch. Patient was referred on 01/03/19.    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 Maybe misses a dose a week. Current diabetes medications include: metformin 500 mg XR (takes 2 tabs after whatever meal she eats), Lantus 20 units   Patient denies hypoglycemic events.  Patient reported dietary habits:  - Admits to higher intake of starches  - Drinks soda 1-2x/week; tries to limit to diet sodas  - Trying to incorporate more fresh fruits and salads   Patient-reported exercise habits:  - Limited outside of childcare     Patient reports improvement in polyuria, polydipsia, polyphagia but still exists. Patient reports neuropathy. Patient denies visual changes. Patient reports self foot exams.   O:  POCT: 267 (ate Nutri[employer]rain bar this AM)  Home fasting CBG: 200s  2 hour post-prandial/random CBG: reports nighttime sugars being "elevated" (400-500s)   Lab Results  Component Value Date   HGBA1C 11.2 (A) 12/29/2018   There were no vitals filed for this visit.  Lipid Panel  No results found for: CHOL, TRIG, HDL, CHOLHDL, VLDL, LDLCALC, LDLDIRECT  Clinical ASCVD: No  The ASCVD  Risk score Mikey Bussing DC Jr., et al., 2013) failed to calculate for the following reasons:   The 2013 ASCVD risk score is only valid for ages 42 to 44   A/P: Diabetes longstanding currently uncontrolled. Patient is able to verbalize appropriate hypoglycemia management plan. Patient is adherent with medication now mostly. Control is suboptimal due to dietary indiscretion and physical inactivity.  -Continued Lantus 20 units for now -Increased dose of metformin to 1000 mg BID (pt to take two 500 mg tabs BID) -Started Trulicity 0.94 mg weekly on Wednesdays.  -Extensively discussed pathophysiology of DM, recommended lifestyle interventions, dietary effects on glycemic control -Counseled on s/sx of and management of hypoglycemia -Next A1C anticipated 03/2019.   ASCVD risk - primary prevention in patient with DM. Patient needs lipid panel. ASCVD risk score cannot be calculated d/t age.  -Lipid, future  HM:  -UTD on pneumonia and tetanus vaccines   Written patient instructions provided.  Total time in face to face counseling 30 minutes.   Follow up Pharmacist Clinic Visit in 3 weeks.     Patient seen with: Jonathon Bellows PharmD Candidate [phone] [place], PharmD, [place] [phone]

## 2019-01-06 NOTE — Patient Instructions (Signed)
Thank you for coming to see me today. Please do the following:  1. Increase metformin to 2 tablets in the morning and 2 tablets in the evening.  2. Continue Lantus 20 units at bedtime.  3. Start injecting Trulicity once a week on Wednesdays.  4. Continue checking blood sugars at home. 5. Continue making the lifestyle changes we've discussed together during our visit. Diet and exercise play a significant role in improving your blood sugars.  6. Follow-up with me on 01/27/19.   Hypoglycemia or low blood sugar:   Low blood sugar can happen quickly and may become an emergency if not treated right away.   While this shouldn't happen often, it can be brought upon if you skip a meal or do not eat enough. Also, if your insulin or other diabetes medications are dosed too high, this can cause your blood sugar to go to low.   Warning signs of low blood sugar include: 1. Feeling shaky or dizzy 2. Feeling weak or tired  3. Excessive hunger 4. Feeling anxious or upset  5. Sweating even when you aren't exercising  What to do if I experience low blood sugar? 1. Check your blood sugar with your meter. If lower than 70, proceed to step 2.  2. Treat with 3-4 glucose tablets or 3 packets of regular sugar. If these aren't around, you can try hard candy. Yet another option would be to drink 4 ounces of fruit juice or 6 ounces of REGULAR soda.  3. Re-check your sugar in 15 minutes. If it is still below 70, do what you did in step 2 again. If has come back up, go ahead and eat a snack or small meal at this time.

## 2019-01-18 ENCOUNTER — Telehealth: Payer: Self-pay

## 2019-01-18 ENCOUNTER — Encounter: Payer: Self-pay | Admitting: Physician Assistant

## 2019-01-18 ENCOUNTER — Encounter: Payer: Medicaid Other | Admitting: Family Medicine

## 2019-01-18 NOTE — Telephone Encounter (Signed)
Called patient due to previous message. Staff was not able to speak with patient and [facility] for patient to call office to resch that appt she missed or she can resch herself via her mychart. Which ever is best for her. Staff did provider office number on patient voicemail.

## 2019-01-18 NOTE — Telephone Encounter (Signed)
Patient missed appointment this morning and need another appointment can you assist her with obtaining another appointment.

## 2019-01-27 ENCOUNTER — Other Ambulatory Visit: Payer: Self-pay

## 2019-01-27 ENCOUNTER — Emergency Department (HOSPITAL_COMMUNITY)
Admission: EM | Admit: 2019-01-27 | Discharge: 2019-01-27 | Disposition: A | Discharge status: Home / Self Care | Payer: Medicaid Other | Attending: Emergency Medicine | Admitting: Emergency Medicine

## 2019-01-27 ENCOUNTER — Ambulatory Visit: Payer: Self-pay | Attending: Family Medicine | Admitting: Pharmacist

## 2019-01-27 ENCOUNTER — Encounter (HOSPITAL_COMMUNITY): Payer: Self-pay | Admitting: Emergency Medicine

## 2019-01-27 DIAGNOSIS — E1165 Type 2 diabetes mellitus with hyperglycemia: Secondary | ICD-10-CM | POA: Insufficient documentation

## 2019-01-27 DIAGNOSIS — Z794 Long term (current) use of insulin: Secondary | ICD-10-CM | POA: Insufficient documentation

## 2019-01-27 DIAGNOSIS — H109 Unspecified conjunctivitis: Secondary | ICD-10-CM

## 2019-01-27 DIAGNOSIS — R739 Hyperglycemia, unspecified: Secondary | ICD-10-CM

## 2019-01-27 DIAGNOSIS — Z87891 Personal history of nicotine dependence: Secondary | ICD-10-CM | POA: Insufficient documentation

## 2019-01-27 DIAGNOSIS — H53149 Visual discomfort, unspecified: Secondary | ICD-10-CM | POA: Insufficient documentation

## 2019-01-27 DIAGNOSIS — Z8709 Personal history of other diseases of the respiratory system: Secondary | ICD-10-CM | POA: Insufficient documentation

## 2019-01-27 LAB — COMPREHENSIVE METABOLIC PANEL
ALT: 14 U/L (ref 0–44)
AST: 21 U/L (ref 15–41)
Albumin: 4.1 g/dL (ref 3.5–5.0)
Alkaline Phosphatase: 103 U/L (ref 38–126)
Anion gap: 10 (ref 5–15)
BUN: 7 mg/dL (ref 6–20)
CO2: 24 mmol/L (ref 22–32)
Calcium: 9 mg/dL (ref 8.9–10.3)
Chloride: 95 mmol/L — ABNORMAL LOW (ref 98–111)
Creatinine, Ser: 0.85 mg/dL (ref 0.44–1.00)
GFR calc Af Amer: >60 mL/min (ref >60)
GFR calc non Af Amer: >60 mL/min (ref >60)
Glucose, Bld: 576 mg/dL (ref 70–99)
Potassium: 4.1 mmol/L (ref 3.5–5.1)
Sodium: 129 mmol/L — ABNORMAL LOW (ref 135–145)
Total Bilirubin: 0.6 mg/dL (ref 0.3–1.2)
Total Protein: 7.2 g/dL (ref 6.5–8.1)

## 2019-01-27 LAB — CBC WITH DIFFERENTIAL/PLATELET
Abs Immature Granulocytes: 0.02 10*3/uL (ref 0.00–0.07)
Basophils Absolute: 0 10*3/uL (ref 0.0–0.1)
Basophils Relative: 1 %
Eosinophils Absolute: 0.1 10*3/uL (ref 0.0–0.5)
Eosinophils Relative: 1 %
HCT: 40.2 % (ref 36.0–46.0)
Hemoglobin: 13.5 g/dL (ref 12.0–15.0)
Immature Granulocytes: 0 %
Lymphocytes Relative: 27 %
Lymphs Abs: 1.9 10*3/uL (ref 0.7–4.0)
MCH: 26.6 pg (ref 26.0–34.0)
MCHC: 33.6 g/dL (ref 30.0–36.0)
MCV: 79.1 fL — ABNORMAL LOW (ref 80.0–100.0)
Monocytes Absolute: 0.3 10*3/uL (ref 0.1–1.0)
Monocytes Relative: 4 %
Neutro Abs: 4.7 10*3/uL (ref 1.7–7.7)
Neutrophils Relative %: 67 %
Platelets: 270 10*3/uL (ref 150–400)
RBC: 5.08 MIL/uL (ref 3.87–5.11)
RDW: 13 % (ref 11.5–15.5)
WBC: 7 10*3/uL (ref 4.0–10.5)
nRBC: 0 % (ref 0.0–0.2)

## 2019-01-27 LAB — URINALYSIS, ROUTINE W REFLEX MICROSCOPIC
Bacteria, UA: NONE SEEN
Bilirubin Urine: NEGATIVE
Glucose, UA: >=500 mg/dL — AB
Hgb urine dipstick: NEGATIVE
Ketones, ur: NEGATIVE mg/dL
Leukocytes,Ua: NEGATIVE
Nitrite: NEGATIVE
Protein, ur: NEGATIVE mg/dL
Specific Gravity, Urine: 1.022 (ref 1.005–1.030)
pH: 6 (ref 5.0–8.0)

## 2019-01-27 LAB — CBG MONITORING, ED
Glucose-Capillary: >600 mg/dL (ref 70–99)
Glucose-Capillary: 381 mg/dL — ABNORMAL HIGH (ref 70–99)

## 2019-01-27 LAB — POC URINE PREG, ED: Preg Test, Ur: NEGATIVE

## 2019-01-27 LAB — GLUCOSE, POCT (MANUAL RESULT ENTRY): POC Glucose: 567 mg/dL — AB (ref 70–99)

## 2019-01-27 MED ORDER — SODIUM CHLORIDE 0.9 % IV BOLUS
1000.0000 mL | Freq: Once | INTRAVENOUS | Status: AC
  Administered 2019-01-27: 1000 mL via INTRAVENOUS

## 2019-01-27 MED ORDER — TETRACAINE HCL 0.5 % OP SOLN
2.0000 [drp] | Freq: Once | OPHTHALMIC | Status: AC
  Administered 2019-01-27: 2 [drp] via OPHTHALMIC
  Filled 2019-01-27: qty 4

## 2019-01-27 MED ORDER — FLUORESCEIN SODIUM 1 MG OP STRP
1.0000 | ORAL_STRIP | Freq: Once | OPHTHALMIC | Status: AC
  Administered 2019-01-27: 1 via OPHTHALMIC
  Filled 2019-01-27: qty 1

## 2019-01-27 NOTE — ED Notes (Signed)
Patient verbalizes understanding of discharge instructions. Opportunity for questioning and answers were provided. Armband removed by staff, pt discharged from ED.  

## 2019-01-27 NOTE — Discharge Instructions (Addendum)
You can use saline eyedrops, available over-the-counter for your eye discomfort. Get rechecked immediately if you have increased pain, swelling or new concerning symptoms in regards to your eye. Please continue your diabetic medications as directed. Please follow-up with [facility]. Please contact your family doctor for recheck.

## 2019-01-27 NOTE — Progress Notes (Addendum)
    S:    PCP: Dr. Chapman Fitch   No chief complaint on file.  Patient arrives today in good spirits. Presents for diabetes evaluation, education, and management at the request of Dr. Chapman Fitch. Patient was referred on 01/03/19.  I saw her on 01/06/19. We increased her metformin to goal dose and started weekly Trulicity.  Today, pt denies adherence to medications and admits to not using her insulin in two days. She admits to frequently omitting her insulin and forgetting to take metformin.   Patient denies adherence with medications.  Current diabetes medications include:  - Metformin 500 mg XR (takes 2 tabs after whatever meal she eats) - Lantus 20 units - Trulicity 6.81 mg weekly  Patient denies hypoglycemic events.  Patient reports polyuria, polydipsia. Patient denies neuropathy. Patient denies visual changes.  O:  POCT: 567  Home CBGs: not checking  Lab Results  Component Value Date   HGBA1C 11.2 (A) 12/29/2018   There were no vitals filed for this visit.  Lipid Panel  No results found for: CHOL, TRIG, HDL, CHOLHDL, VLDL, LDLCALC, LDLDIRECT  Clinical ASCVD: No  The ASCVD Risk score Mikey Bussing DC Jr., et al., 2013) failed to calculate for the following reasons:   The 2013 ASCVD risk score is only valid for ages 37 to 59   A/P: Diabetes longstanding currently uncontrolled. Patient is able to verbalize appropriate hypoglycemia management plan. Patient is not adherent with medication. Control is suboptimal due to dietary indiscretion and medication non-compliance. Consulted with Dr. Chapman Fitch; will send pt to the ED.   Addendum: I recommended that patient follow-up at the emergency department due to her hyperglycemia but also because patient had suffered recent eye injury for which she had not sought medical attention.  Antony Blackbird MD  Patient seen with: Jonathon Bellows PharmD Candidate [phone] [place], PharmD, [place] [phone]

## 2019-01-27 NOTE — ED Provider Notes (Signed)
[facility] EMERGENCY DEPARTMENT Provider Note   CSN: [id number] Arrival date & time: 01/27/19  1010    History   Chief Complaint No chief complaint on file.   HPI [name] is a 33 y.o. female.     The history is provided by the patient and medical records. No language interpreter was used.   [name] is a 33 y.o. female who presents to the Emergency Department complaining of glycemia. She was referred to the emergency department by the pharmacy at [facility] for evaluation of hyperglycemia, blood sugar 597. She complains of feeling fatigued for the last several days with poor energy and feeling depressed. She is under increased stress at home. She states that she has a habit of excessive eating when she is feeling stressed and depressed. She is intermittently compliant with her diabetic medications. She last took Lantus about two days ago. She did take metformin once yesterday as well as true the city yesterday. She denies any fevers, chest pain, shortness of breath, nausea, vomiting, diarrhea. Symptoms are moderate, constant, worsening. She also complains of pain to her right eye. She does have some drainage to the eye. She feels like she got an eyelash in it. She is able to see but severe photophobia limits her vision.. Past Medical History:  Diagnosis Date  . Asthma   . Diabetes mellitus without complication ([place])   . 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 approx 3-4cm at time of 01/2016 c-section  . Herpes   . Trichomonas vaginitis     Patient Active Problem List   Diagnosis Date Noted  . Anxiety 08/27/2018  . DKA, type 2, not at goal [facility]) 08/27/2018  . DKA (diabetic ketoacidoses) ([facility]) 08/27/2018  . New persistent daily headache 01/10/2018  . Uterine leiomyoma 01/10/2018  . Family history of diabetes mellitus in mother 01/10/2018  . Paresthesia of both feet 01/10/2018  . Visual field scotoma of both eyes  01/10/2018  . Other headache syndrome 01/10/2018  . Newly diagnosed diabetes ([place]) 01/10/2018  . S/P primary low transverse C-section 01/08/2016  . Asthma, mild intermittent 08/14/2015  . Sickle cell trait ([place]) 09/01/2014  . Morbid (severe) obesity due to excess calories ([place]) 04/28/2012    Past Surgical History:  Procedure Laterality Date  . CESAREAN SECTION N/A 01/08/2016   Procedure: CESAREAN SECTION;  Surgeon: Aletha Halim, MD;  Location: [facility];  Service: Obstetrics;  Laterality: N/A;  . DILATION AND CURETTAGE OF UTERUS       OB History    Gravida  4   Para  3   Term  3   Preterm      AB  1   Living  3     SAB  1   TAB      Ectopic      Multiple  0   Live Births  3            Home Medications    Prior to Admission medications   Medication Sig Start Date End Date Taking? Authorizing Provider  acetaminophen (TYLENOL) 500 MG tablet Take 1 tablet (500 mg total) by mouth every 8 (eight) hours as needed for moderate pain. 08/29/18 08/29/19 Yes Thurnell Lose, MD  albuterol (PROVENTIL HFA;VENTOLIN HFA) 108 (90 Base) MCG/ACT inhaler Inhale 2 puffs into the lungs every 6 (six) hours as needed for wheezing or shortness of breath. 11/03/18  Yes Fulp, Cammie, MD  Dulaglutide (TRULICITY)  0.75 MG/0.5ML SOPN Inject 0.75 mg into the skin once a week. On Wednesdays. 01/06/19  Yes Fulp, Cammie, MD  etonogestrel (NEXPLANON) 68 MG IMPL implant 1 each by Subdermal route once.   Yes [provider]  ibuprofen (ADVIL,MOTRIN) 600 MG tablet Take 1 tablet (600 mg total) by mouth every 8 (eight) hours as needed for moderate pain. 08/29/18  Yes Thurnell Lose, MD  insulin glargine (LANTUS) 100 UNIT/ML injection Inject 0.2 mLs (20 Units total) into the skin at bedtime. Can stop Lantus if fasting blood sugars are 80 or less for 2 or more days 12/29/18  Yes McClung, Angela M, PA-C  metFORMIN (GLUCOPHAGE) 500 MG tablet Take 2 tablets (1,000 mg total) by mouth 2 (two)  times daily with a meal. 01/06/19  Yes Fulp, Cammie, MD  valACYclovir (VALTREX) 500 MG tablet One tablet twice per day for 3 days as needed for acute outbreaks 12/29/18  Yes McClung, Angela M, PA-C  blood glucose meter kit and supplies KIT Dispense based on patient and insurance preference. Use up to four times daily as directed. (FOR ICD-10 E11.9). 01/17/18   Forrest Moron, MD  Continuous Blood Gluc Receiver (FREESTYLE LIBRE 14 DAY READER) DEVI 1 application by Does not apply route 2 (two) times daily. 01/17/18   Forrest Moron, MD  Continuous Blood Gluc Sensor (FREESTYLE LIBRE 14 DAY SENSOR) MISC 1 Device by Does not apply route every 14 (fourteen) days. 01/17/18   Forrest Moron, MD  fluconazole (DIFLUCAN) 150 MG tablet Take 1 now and 1 in 1 week. Patient not taking: Reported on 01/27/2019 01/01/19   Argentina Donovan, PA-C  glucose blood test strip Use to check blood glucose twice daily. Use as instructed 01/17/18   Delia Chimes A, MD  Insulin Syringe-Needle U-100 25G X 1" 1 ML MISC For 4 times a day insulin SQ, 1 month supply. Diagnosis E11.65 08/29/18   Thurnell Lose, MD  Lancet Devices (LANCING DEVICE) MISC Use to check blood glucose twice daily.  E 11.9 01/17/18   Delia Chimes A, MD  valACYclovir (VALTREX) 500 MG tablet Take 1 tablet (500 mg total) by mouth daily. For prevention Patient not taking: Reported on 01/27/2019 12/29/18   Argentina Donovan, PA-C    Family History Family History  Problem Relation Age of Onset  . Diabetes Mother   . Hypertension Mother   . Hyperlipidemia Mother   . Heart disease Mother   . Diabetes Sister   . Diabetes Brother     Social History Social History   Tobacco Use  . Smoking status: Former Smoker    Types: Cigarettes  . Smokeless tobacco: Never Used  . Tobacco comment: quit 2013  Substance Use Topics  . Alcohol use: No    Comment: occasional, not with preg  . Drug use: No     Allergies   Patient has no known allergies.   Review  of Systems Review of Systems  All other systems reviewed and are negative.    Physical Exam Updated Vital Signs BP 121/80   Pulse 80   Temp 98 F (36.7 C)   Resp (!) 22   Ht '5\' 8"'$  (1.727 m)   Wt 108.9 kg   LMP 01/22/2019 (Exact Date)   SpO2 100%   BMI 36.49 kg/m   Physical Exam Vitals signs and nursing note reviewed.  Constitutional:      Appearance: She is well-developed.  HENT:     Head: Normocephalic and atraumatic.  Comments: Pupils equal round and reactive. Photophobia. Significant diffuse conjunctival injection. Cardiovascular:     Rate and Rhythm: Normal rate and regular rhythm.  Pulmonary:     Effort: Pulmonary effort is normal. No respiratory distress.  Abdominal:     Palpations: Abdomen is soft.     Tenderness: There is no abdominal tenderness. There is no guarding or rebound.  Musculoskeletal:        General: No tenderness.  Skin:    General: Skin is warm and dry.     Capillary Refill: Capillary refill takes less than 2 seconds.  Neurological:     Mental Status: She is alert and oriented to person, place, and time.  Psychiatric:     Comments: Tearful, depressed mood and affect. Denies SI.      ED Treatments / Results  Labs (all labs ordered are listed, but only abnormal results are displayed) Labs Reviewed  COMPREHENSIVE METABOLIC PANEL - Abnormal; Notable for the following components:      Result Value   Sodium 129 (*)    Chloride 95 (*)    Glucose, Bld 576 (*)    All other components within normal limits  CBC WITH DIFFERENTIAL/PLATELET - Abnormal; Notable for the following components:   MCV 79.1 (*)    All other components within normal limits  URINALYSIS, ROUTINE W REFLEX MICROSCOPIC - Abnormal; Notable for the following components:   Color, Urine COLORLESS (*)    Glucose, UA >=500 (*)    All other components within normal limits  CBG MONITORING, ED - Abnormal; Notable for the following components:   Glucose-Capillary >600 (*)    All  other components within normal limits  CBG MONITORING, ED - Abnormal; Notable for the following components:   Glucose-Capillary 381 (*)    All other components within normal limits  POC URINE PREG, ED    EKG None  Radiology No results found.  Procedures Procedures (including critical care time)  Medications Ordered in ED Medications  fluorescein ophthalmic strip 1 strip (1 strip Both Eyes Given 01/27/19 1243)  tetracaine (PONTOCAINE) 0.5 % ophthalmic solution 2 drop (2 drops Right Eye Given 01/27/19 1243)  sodium chloride 0.9 % bolus 1,000 mL (0 mLs Intravenous Stopped 01/27/19 1333)  sodium chloride 0.9 % bolus 1,000 mL (0 mLs Intravenous Stopped 01/27/19 1333)     Initial Impression / Assessment and Plan / ED Course  I have reviewed the triage vital signs and the nursing notes.  Pertinent labs & imaging results that were available during my care of the patient were reviewed by me and considered in my medical decision making (see chart for details).       Pt with history of diabetes here for evaluation of hyperglycemia. She has been noncompliance with her home medications due to increased stress at home. She is not actively suicidal. Labs are significant for hyperglycemia, appropriate hyponatremia. She was treated with IV fluid hydration with improvement in her hypoglycemia. She also complains of pain to her right eye. There is no evidence of foreign body on examination, no corneal abrasion. Patient is unable to tolerate Tonopen examination and can continues to close her eye when attempting to perform. Her pupils are equal round and reactive. Globes feel symmetric on palpation. Presentation is not consistent with acute angle closure glaucoma. Discussed with patient home care for conjunctivitis, favor this is due to recent irritant. Discussed outpatient follow-up for hyperglycemia, conjunctivitis.  Discussed outpatient follow up and return precautions.    Final Clinical Impressions(s)  /  ED Diagnoses   Final diagnoses:  Hyperglycemia  Conjunctivitis of right eye, unspecified conjunctivitis type    ED Discharge Orders    None       Quintella Reichert, MD 01/27/19 1514

## 2019-01-27 NOTE — ED Notes (Signed)
Patient arrived from home her PCP with reports of Hyperglycemia. She reports that she is not taking her medications as she is supposed because she has been "forgetful lately."

## 2019-02-07 ENCOUNTER — Other Ambulatory Visit: Payer: Self-pay | Admitting: Physician Assistant

## 2019-02-07 DIAGNOSIS — E1165 Type 2 diabetes mellitus with hyperglycemia: Secondary | ICD-10-CM

## 2019-02-07 DIAGNOSIS — E119 Type 2 diabetes mellitus without complications: Secondary | ICD-10-CM

## 2019-02-07 MED ORDER — INSULIN GLARGINE 100 UNIT/ML ~~LOC~~ SOLN: 20.0000 [IU] | Freq: Every day | SUBCUTANEOUS | 2 refills | Status: DC

## 2019-02-07 NOTE — Telephone Encounter (Signed)
Refill if appropriate .

## 2019-03-03 ENCOUNTER — Other Ambulatory Visit: Payer: Self-pay | Admitting: Family Medicine

## 2019-03-09 ENCOUNTER — Other Ambulatory Visit: Payer: Self-pay

## 2019-03-09 DIAGNOSIS — E1165 Type 2 diabetes mellitus with hyperglycemia: Secondary | ICD-10-CM

## 2019-03-09 MED ORDER — TRULICITY 0.75 MG/0.5ML ~~LOC~~ SOAJ: 0.7500 mg | SUBCUTANEOUS | 2 refills | Status: DC

## 2019-03-09 NOTE — Telephone Encounter (Signed)
Sent Trulicity to [facility] pharmacy([facility]). Rx is same as order that was approved by Dr. Chapman Fitch 03/05/19 for pt's [facility] application [facility]

## 2019-04-27 ENCOUNTER — Other Ambulatory Visit: Payer: Self-pay | Admitting: Family Medicine

## 2019-04-27 DIAGNOSIS — E1165 Type 2 diabetes mellitus with hyperglycemia: Secondary | ICD-10-CM

## 2019-05-14 ENCOUNTER — Encounter: Payer: Self-pay | Admitting: Family Medicine

## 2019-05-21 ENCOUNTER — Encounter (HOSPITAL_COMMUNITY): Payer: Self-pay | Admitting: Emergency Medicine

## 2019-05-21 ENCOUNTER — Other Ambulatory Visit: Payer: Self-pay

## 2019-05-21 ENCOUNTER — Emergency Department (HOSPITAL_COMMUNITY)
Admission: EM | Admit: 2019-05-21 | Discharge: 2019-05-21 | Disposition: A | Discharge status: Home / Self Care | Payer: Medicaid Other | Attending: Emergency Medicine | Admitting: Emergency Medicine

## 2019-05-21 DIAGNOSIS — J45909 Unspecified asthma, uncomplicated: Secondary | ICD-10-CM | POA: Insufficient documentation

## 2019-05-21 DIAGNOSIS — Z794 Long term (current) use of insulin: Secondary | ICD-10-CM | POA: Insufficient documentation

## 2019-05-21 DIAGNOSIS — Z79899 Other long term (current) drug therapy: Secondary | ICD-10-CM | POA: Insufficient documentation

## 2019-05-21 DIAGNOSIS — R739 Hyperglycemia, unspecified: Secondary | ICD-10-CM

## 2019-05-21 DIAGNOSIS — L03031 Cellulitis of right toe: Secondary | ICD-10-CM | POA: Insufficient documentation

## 2019-05-21 DIAGNOSIS — Z87891 Personal history of nicotine dependence: Secondary | ICD-10-CM | POA: Insufficient documentation

## 2019-05-21 DIAGNOSIS — E1165 Type 2 diabetes mellitus with hyperglycemia: Secondary | ICD-10-CM | POA: Insufficient documentation

## 2019-05-21 LAB — URINALYSIS, ROUTINE W REFLEX MICROSCOPIC
Bacteria, UA: NONE SEEN
Bilirubin Urine: NEGATIVE
Glucose, UA: >=500 mg/dL — AB
Ketones, ur: NEGATIVE mg/dL
Leukocytes,Ua: NEGATIVE
Nitrite: NEGATIVE
Protein, ur: NEGATIVE mg/dL
Specific Gravity, Urine: 1.022 (ref 1.005–1.030)
pH: 6 (ref 5.0–8.0)

## 2019-05-21 LAB — BASIC METABOLIC PANEL
Anion gap: 12 (ref 5–15)
BUN: 9 mg/dL (ref 6–20)
CO2: 23 mmol/L (ref 22–32)
Calcium: 9.1 mg/dL (ref 8.9–10.3)
Chloride: 96 mmol/L — ABNORMAL LOW (ref 98–111)
Creatinine, Ser: 0.92 mg/dL (ref 0.44–1.00)
GFR calc Af Amer: >60 mL/min (ref >60)
GFR calc non Af Amer: >60 mL/min (ref >60)
Glucose, Bld: 480 mg/dL — ABNORMAL HIGH (ref 70–99)
Potassium: 4.5 mmol/L (ref 3.5–5.1)
Sodium: 131 mmol/L — ABNORMAL LOW (ref 135–145)

## 2019-05-21 LAB — I-STAT BETA HCG BLOOD, ED (MC, WL, AP ONLY): I-stat hCG, quantitative: <5 m[IU]/mL (ref <5)

## 2019-05-21 LAB — CBC
HCT: 38 % (ref 36.0–46.0)
Hemoglobin: 12.6 g/dL (ref 12.0–15.0)
MCH: 26.6 pg (ref 26.0–34.0)
MCHC: 33.2 g/dL (ref 30.0–36.0)
MCV: 80.2 fL (ref 80.0–100.0)
Platelets: 331 10*3/uL (ref 150–400)
RBC: 4.74 MIL/uL (ref 3.87–5.11)
RDW: 13.3 % (ref 11.5–15.5)
WBC: 7.5 10*3/uL (ref 4.0–10.5)
nRBC: 0 % (ref 0.0–0.2)

## 2019-05-21 LAB — CBG MONITORING, ED
Glucose-Capillary: 496 mg/dL — ABNORMAL HIGH (ref 70–99)
Glucose-Capillary: 324 mg/dL — ABNORMAL HIGH (ref 70–99)
Glucose-Capillary: 287 mg/dL — ABNORMAL HIGH (ref 70–99)

## 2019-05-21 MED ORDER — KETOCONAZOLE 2 % EX CREA: 1.0000 "application " | TOPICAL_CREAM | Freq: Two times a day (BID) | CUTANEOUS | 0 refills | Status: DC

## 2019-05-21 MED ORDER — ONDANSETRON HCL 4 MG/2ML IJ SOLN
4.0000 mg | Freq: Once | INTRAMUSCULAR | Status: AC
  Administered 2019-05-21: 4 mg via INTRAVENOUS
  Filled 2019-05-21: qty 2

## 2019-05-21 MED ORDER — INSULIN ASPART 100 UNIT/ML ~~LOC~~ SOLN
10.0000 [IU] | Freq: Once | SUBCUTANEOUS | Status: AC
  Administered 2019-05-21: 5 [IU] via SUBCUTANEOUS

## 2019-05-21 MED ORDER — SODIUM CHLORIDE 0.9 % IV BOLUS
1000.0000 mL | Freq: Once | INTRAVENOUS | Status: AC
  Administered 2019-05-21: 1000 mL via INTRAVENOUS

## 2019-05-21 NOTE — ED Notes (Signed)
Patient verbalizes understanding of discharge instructions. Opportunity for questioning and answers were provided. Armband removed by staff, pt discharged from ED ambulatory to home.  

## 2019-05-21 NOTE — ED Triage Notes (Signed)
Pt endorses some dizziness, nausea and fatigue for a couple of days. Hx of DM. States her CBG this AM read High before taking medications. Also states she has a sore on her right foot.

## 2019-05-21 NOTE — Discharge Instructions (Signed)
Take your insulin as prescribed.   Use ketoconazole twice daily to the foot. Use warm compresses   See your doctor  Return to ER if you have glucose > 600, fever, vomiting, worse toe pain and swelling

## 2019-05-21 NOTE — ED Provider Notes (Addendum)
[facility] EMERGENCY DEPARTMENT Provider Note   CSN: [id number] Arrival date & time: 05/21/19  1553     History   Chief Complaint Chief Complaint  Patient presents with  . Nausea    HPI [name] is a 33 y.o. female hx of uncontrolled type 2 diabetes, here presenting with hyperglycemia.  Patient states that she was on Trulicity but she ran out of her insurance.  She states that she has not been on it for the last several weeks.  Also ran out of her glucose strips.  She states that she finally got them yesterday and she has been urinating very frequently and has been feeling weak all over.  She has been also very tired as well.  She check her sugar yesterday and read high.  She does have some insulin at home and gave herself some shots of insulin.  She also noticed some swelling of the right big toe as well.      The history is provided by the patient.    Past Medical History:  Diagnosis Date  . Asthma   . Diabetes mellitus without complication ([place])   . 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 approx 3-4cm at time of 01/2016 c-section  . Herpes   . Trichomonas vaginitis     Patient Active Problem List   Diagnosis Date Noted  . Anxiety 08/27/2018  . DKA, type 2, not at goal [facility]) 08/27/2018  . DKA (diabetic ketoacidoses) ([place]) 08/27/2018  . New persistent daily headache 01/10/2018  . Uterine leiomyoma 01/10/2018  . Family history of diabetes mellitus in mother 01/10/2018  . Paresthesia of both feet 01/10/2018  . Visual field scotoma of both eyes 01/10/2018  . Other headache syndrome 01/10/2018  . Newly diagnosed diabetes ([place]) 01/10/2018  . S/P primary low transverse C-section 01/08/2016  . Asthma, mild intermittent 08/14/2015  . Sickle cell trait ([place]) 09/01/2014  . Morbid (severe) obesity due to excess calories ([place]) 04/28/2012    Past Surgical History:  Procedure Laterality Date  . CESAREAN SECTION N/A 01/08/2016   Procedure:  CESAREAN SECTION;  Surgeon: Aletha Halim, MD;  Location: [facility];  Service: Obstetrics;  Laterality: N/A;  . DILATION AND CURETTAGE OF UTERUS       OB History    Gravida  4   Para  3   Term  3   Preterm      AB  1   Living  3     SAB  1   TAB      Ectopic      Multiple  0   Live Births  3            Home Medications    Prior to Admission medications   Medication Sig Start Date End Date Taking? Authorizing Provider  ACCU-CHEK GUIDE test strip USE AS DIRECTED TO CHECK BLOOD GLUCOSE TWICE DAILY 03/03/19   Delia Chimes A, MD  acetaminophen (TYLENOL) 500 MG tablet Take 1 tablet (500 mg total) by mouth every 8 (eight) hours as needed for moderate pain. 08/29/18 08/29/19  Thurnell Lose, MD  albuterol (PROVENTIL HFA;VENTOLIN HFA) 108 (90 Base) MCG/ACT inhaler Inhale 2 puffs into the lungs every 6 (six) hours as needed for wheezing or shortness of breath. 11/03/18   Fulp, Cammie, MD  blood glucose meter kit and supplies KIT Dispense based on patient and insurance preference. Use up to four times daily as directed. (FOR ICD-10 E11.9). 01/17/18  Forrest Moron, MD  Continuous Blood Gluc Receiver (FREESTYLE LIBRE 14 DAY READER) DEVI 1 application by Does not apply route 2 (two) times daily. 01/17/18   Forrest Moron, MD  Continuous Blood Gluc Sensor (FREESTYLE LIBRE 14 DAY SENSOR) MISC 1 Device by Does not apply route every 14 (fourteen) days. 01/17/18   Forrest Moron, MD  etonogestrel (NEXPLANON) 68 MG IMPL implant 1 each by Subdermal route once.    [provider]  fluconazole (DIFLUCAN) 150 MG tablet Take 1 now and 1 in 1 week. Patient not taking: Reported on 01/27/2019 01/01/19   Argentina Donovan, PA-C  ibuprofen (ADVIL,MOTRIN) 600 MG tablet Take 1 tablet (600 mg total) by mouth every 8 (eight) hours as needed for moderate pain. 08/29/18   Thurnell Lose, MD  insulin glargine (LANTUS) 100 UNIT/ML injection Inject 0.2 mLs (20 Units total) into the  skin at bedtime. Can stop Lantus if fasting blood sugars are 80 or less for 2 or more days 02/07/19   Fulp, Cammie, MD  Insulin Syringe-Needle U-100 25G X 1" 1 ML MISC For 4 times a day insulin SQ, 1 month supply. Diagnosis E11.65 08/29/18   Thurnell Lose, MD  Lancet Devices (LANCING DEVICE) MISC Use to check blood glucose twice daily.  E 11.9 01/17/18   Forrest Moron, MD  metFORMIN (GLUCOPHAGE) 500 MG tablet Take 2 tablets (1,000 mg total) by mouth 2 (two) times daily with a meal. 01/06/19   Fulp, Cammie, MD  TRULICITY 4.38 VK/1.8MC SOPN INJECT 0.75 MG INTO THE SKIN ONCE A WEEK. ON WEDNESDAYS. 04/27/19   Fulp, Cammie, MD  valACYclovir (VALTREX) 500 MG tablet Take 1 tablet (500 mg total) by mouth daily. For prevention Patient not taking: Reported on 01/27/2019 12/29/18   Argentina Donovan, PA-C  valACYclovir (VALTREX) 500 MG tablet One tablet twice per day for 3 days as needed for acute outbreaks 12/29/18   Argentina Donovan, PA-C    Family History Family History  Problem Relation Age of Onset  . Diabetes Mother   . Hypertension Mother   . Hyperlipidemia Mother   . Heart disease Mother   . Diabetes Sister   . Diabetes Brother     Social History Social History   Tobacco Use  . Smoking status: Former Smoker    Types: Cigarettes  . Smokeless tobacco: Never Used  . Tobacco comment: quit 2013  Substance Use Topics  . Alcohol use: No    Comment: occasional, not with preg  . Drug use: No     Allergies   Patient has no known allergies.   Review of Systems Review of Systems  Skin: Positive for wound.  Neurological: Positive for dizziness and weakness.  All other systems reviewed and are negative.    Physical Exam Updated Vital Signs BP (!) 131/91   Pulse 79   Temp 98.2 F (36.8 C) (Oral)   Resp 18   SpO2 100%   Physical Exam Vitals signs and nursing note reviewed.  HENT:     Head: Normocephalic.     Nose: Nose normal.     Mouth/Throat:     Mouth: Mucous membranes  are dry.  Eyes:     Extraocular Movements: Extraocular movements intact.     Pupils: Pupils are equal, round, and reactive to light.  Neck:     Musculoskeletal: Normal range of motion.  Cardiovascular:     Rate and Rhythm: Normal rate and regular rhythm.  Pulses: Normal pulses.     Heart sounds: Normal heart sounds.  Pulmonary:     Effort: Pulmonary effort is normal.     Breath sounds: Normal breath sounds.  Abdominal:     General: Abdomen is flat.     Palpations: Abdomen is soft.  Musculoskeletal: Normal range of motion.     Comments: R big toe with ? Paronychia that is draining already vs small fungal infection   Skin:    General: Skin is warm.     Capillary Refill: Capillary refill takes less than 2 seconds.  Neurological:     General: No focal deficit present.     Mental Status: She is alert and oriented to person, place, and time.  Psychiatric:        Mood and Affect: Mood normal.        Behavior: Behavior normal.      ED Treatments / Results  Labs (all labs ordered are listed, but only abnormal results are displayed) Labs Reviewed  BASIC METABOLIC PANEL - Abnormal; Notable for the following components:      Result Value   Sodium 131 (*)    Chloride 96 (*)    Glucose, Bld 480 (*)    All other components within normal limits  URINALYSIS, ROUTINE W REFLEX MICROSCOPIC - Abnormal; Notable for the following components:   Color, Urine STRAW (*)    Glucose, UA >=500 (*)    Hgb urine dipstick LARGE (*)    All other components within normal limits  CBG MONITORING, ED - Abnormal; Notable for the following components:   Glucose-Capillary 496 (*)    All other components within normal limits  CBG MONITORING, ED - Abnormal; Notable for the following components:   Glucose-Capillary 324 (*)    All other components within normal limits  CBC  I-STAT BETA HCG BLOOD, ED ([facility], [facility], [facility] ONLY)  CBG MONITORING, ED    EKG None  Radiology No results found.  Procedures  Procedures (including critical care time)  Medications Ordered in ED Medications  sodium chloride 0.9 % bolus 1,000 mL (1,000 mLs Intravenous New Bag/Given 05/21/19 1935)  insulin aspart (novoLOG) injection 10 Units (5 Units Subcutaneous Given 05/21/19 2003)  ondansetron (ZOFRAN) injection 4 mg (4 mg Intravenous Given 05/21/19 1932)     Initial Impression / Assessment and Plan / ED Course  I have reviewed the triage vital signs and the nursing notes.  Pertinent labs & imaging results that were available during my care of the patient were reviewed by me and considered in my medical decision making (see chart for details).        [name] is a 33 y.o. female here with hyperglycemia. She has type 2 DM and she is unable to get Trulicity due to insurance reasons. Will get labs to r/o DKA. Will give IVF and insulin.   9:30 PM Patient's glucose was 320. Nl AG. Glucose down to 280 after IVF and subQ insulin. She still has insulin at home. I encouraged her to follow up with PCP. Of note, she has R big toe fungal infection vs draining paronychia and will give ketoconazole cream     Final Clinical Impressions(s) / ED Diagnoses   Final diagnoses:  None    ED Discharge Orders    None       Drenda Freeze, MD 05/21/19 2131    Drenda Freeze, MD 05/21/19 2132

## 2019-06-25 ENCOUNTER — Encounter: Payer: Self-pay | Admitting: Family Medicine

## 2019-06-26 ENCOUNTER — Inpatient Hospital Stay: Payer: Medicaid Other | Admitting: Family Medicine

## 2019-06-26 ENCOUNTER — Other Ambulatory Visit: Payer: Self-pay | Admitting: Physician Assistant

## 2019-06-27 MED ORDER — FLUCONAZOLE 150 MG PO TABS: ORAL_TABLET | ORAL | 0 refills | Status: DC

## 2019-06-28 ENCOUNTER — Other Ambulatory Visit: Payer: Self-pay

## 2019-06-28 ENCOUNTER — Encounter: Payer: Self-pay | Admitting: Emergency Medicine

## 2019-06-28 ENCOUNTER — Ambulatory Visit
Admission: EM | Admit: 2019-06-28 | Discharge: 2019-06-28 | Disposition: A | Discharge status: Home / Self Care | Payer: Medicaid Other | Attending: Emergency Medicine | Admitting: Emergency Medicine

## 2019-06-28 DIAGNOSIS — Z760 Encounter for issue of repeat prescription: Secondary | ICD-10-CM

## 2019-06-28 DIAGNOSIS — E119 Type 2 diabetes mellitus without complications: Secondary | ICD-10-CM

## 2019-06-28 DIAGNOSIS — E1165 Type 2 diabetes mellitus with hyperglycemia: Secondary | ICD-10-CM

## 2019-06-28 DIAGNOSIS — A6000 Herpesviral infection of urogenital system, unspecified: Secondary | ICD-10-CM

## 2019-06-28 DIAGNOSIS — Z794 Long term (current) use of insulin: Secondary | ICD-10-CM

## 2019-06-28 LAB — POCT FASTING CBG KUC MANUAL ENTRY: POCT Glucose (KUC): 511 mg/dL — AB (ref 70–99)

## 2019-06-28 MED ORDER — INSULIN GLARGINE 100 UNIT/ML ~~LOC~~ SOLN: 20.0000 [IU] | Freq: Every day | SUBCUTANEOUS | 0 refills | Status: DC

## 2019-06-28 MED ORDER — METFORMIN HCL 500 MG PO TABS: 1000.0000 mg | ORAL_TABLET | Freq: Two times a day (BID) | ORAL | 0 refills | Status: DC

## 2019-06-28 MED ORDER — LANCING DEVICE MISC: 0 refills | Status: DC

## 2019-06-28 MED ORDER — "INSULIN SYRINGE-NEEDLE U-100 25G X 1"" 1 ML MISC": 0 refills | Status: DC

## 2019-06-28 MED ORDER — VALACYCLOVIR HCL 500 MG PO TABS: 500.0000 mg | ORAL_TABLET | Freq: Two times a day (BID) | ORAL | 0 refills | Status: DC

## 2019-06-28 NOTE — Discharge Instructions (Addendum)
Very important to follow up with your PCP! Please take home medications as prescribed. Go to ER for chest pain, difficulty breathing, severe abdominal pain, vomiting, diarrhea.

## 2019-06-28 NOTE — ED Notes (Signed)
Patient able to ambulate independently  

## 2019-06-28 NOTE — ED Provider Notes (Signed)
[facility]    CSN: [id number] Arrival date & time: 06/28/19  1911      History   Chief Complaint Chief Complaint  Patient presents with  . Hyperglycemia  . Herpes Zoster    HPI [name] is a 33 y.o. female with history of uncontrolled type 2 diabetes with long-term insulin use, asthma, genital herpes presenting for medication refill.  States she is been out of her Lantus and metformin for the last week, and Trulicity for months.  States that she contacted her primary care for refills (this was verified by me at time of appointment via chart review), though since she has not been seen since June she requires in-person appointment.  Patient feels that this conflicts with her work schedule.  Patient has had high sugars, not been keeping track.  Patient does endorse polyuria, polydipsia.  Does not appear to have made significant lifestyle changes.  Last A1c from 12/21/2018: 11.2%.  Patient denies chest pain, shortness of breath, severe abdominal pain, nausea, vomiting, diarrhea.  Patient last ate at 1030 this morning, drank a diet soda 30 minutes PTA.  States has had a herpes flare for the last week.  Has been doing Epson salt soaks, alcohol pads without relief.  Has tolerated Valtrex well in the past.  Past Medical History:  Diagnosis Date  . Asthma   . Diabetes mellitus without complication ([place])   . 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 approx 3-4cm at time of 01/2016 c-section  . Herpes   . Trichomonas vaginitis     Patient Active Problem List   Diagnosis Date Noted  . Anxiety 08/27/2018  . DKA, type 2, not at goal [facility]) 08/27/2018  . DKA (diabetic ketoacidoses) ([place]) 08/27/2018  . New persistent daily headache 01/10/2018  . Uterine leiomyoma 01/10/2018  . Family history of diabetes mellitus in mother 01/10/2018  . Paresthesia of both feet 01/10/2018  . Visual field scotoma of both eyes 01/10/2018  . Other headache syndrome 01/10/2018  .  Newly diagnosed diabetes ([place]) 01/10/2018  . S/P primary low transverse C-section 01/08/2016  . Asthma, mild intermittent 08/14/2015  . Sickle cell trait ([place]) 09/01/2014  . Morbid (severe) obesity due to excess calories ([place]) 04/28/2012    Past Surgical History:  Procedure Laterality Date  . CESAREAN SECTION N/A 01/08/2016   Procedure: CESAREAN SECTION;  Surgeon: Aletha Halim, MD;  Location: [facility];  Service: Obstetrics;  Laterality: N/A;  . DILATION AND CURETTAGE OF UTERUS      OB History    Gravida  4   Para  3   Term  3   Preterm      AB  1   Living  3     SAB  1   TAB      Ectopic      Multiple  0   Live Births  3            Home Medications    Prior to Admission medications   Medication Sig Start Date End Date Taking? Authorizing Provider  ACCU-CHEK GUIDE test strip USE AS DIRECTED TO CHECK BLOOD GLUCOSE TWICE DAILY 03/03/19   Delia Chimes A, MD  acetaminophen (TYLENOL) 500 MG tablet Take 1 tablet (500 mg total) by mouth every 8 (eight) hours as needed for moderate pain. 08/29/18 08/29/19  Thurnell Lose, MD  albuterol (PROVENTIL HFA;VENTOLIN HFA) 108 (90 Base) MCG/ACT inhaler Inhale 2 puffs into the lungs every 6 (six)  hours as needed for wheezing or shortness of breath. 11/03/18   Fulp, Cammie, MD  blood glucose meter kit and supplies KIT Dispense based on patient and insurance preference. Use up to four times daily as directed. (FOR ICD-10 E11.9). 01/17/18   Forrest Moron, MD  Continuous Blood Gluc Receiver (FREESTYLE LIBRE 14 DAY READER) DEVI 1 application by Does not apply route 2 (two) times daily. 01/17/18   Forrest Moron, MD  Continuous Blood Gluc Sensor (FREESTYLE LIBRE 14 DAY SENSOR) MISC 1 Device by Does not apply route every 14 (fourteen) days. 01/17/18   Forrest Moron, MD  etonogestrel (NEXPLANON) 68 MG IMPL implant 1 each by Subdermal route once.    [provider]  ibuprofen (ADVIL,MOTRIN) 600 MG tablet Take 1  tablet (600 mg total) by mouth every 8 (eight) hours as needed for moderate pain. 08/29/18   Thurnell Lose, MD  insulin glargine (LANTUS) 100 UNIT/ML injection Inject 0.2 mLs (20 Units total) into the skin at bedtime. Can stop Lantus if fasting blood sugars are 80 or less for 2 or more days 06/28/19   Hall-Potvin, [place], PA-C  Insulin Syringe-Needle U-100 25G X 1" 1 ML MISC For 4 times a day insulin SQ, 1 month supply. Diagnosis E11.65 06/28/19   Hall-Potvin, [place], PA-C  ketoconazole (NIZORAL) 2 % cream Apply 1 application topically 2 (two) times daily. 05/21/19   Drenda Freeze, MD  Lancet Devices (LANCING DEVICE) MISC Use to check blood glucose twice daily.  E 11.9 06/28/19   Hall-Potvin, [place], PA-C  metFORMIN (GLUCOPHAGE) 500 MG tablet Take 2 tablets (1,000 mg total) by mouth 2 (two) times daily with a meal. 06/28/19   Hall-Potvin, [place], PA-C  TRULICITY 1.94 RD/4.0CX SOPN INJECT 0.75 MG INTO THE SKIN ONCE A WEEK. ON WEDNESDAYS. 04/27/19   Fulp, Cammie, MD  valACYclovir (VALTREX) 500 MG tablet Take 1 tablet (500 mg total) by mouth 2 (two) times daily. One tablet twice per day for 3 days as needed for acute outbreaks 06/28/19   Hall-Potvin, [place], PA-C    Family History Family History  Problem Relation Age of Onset  . Diabetes Mother   . Hypertension Mother   . Hyperlipidemia Mother   . Heart disease Mother   . Diabetes Sister   . Diabetes Brother     Social History Social History   Tobacco Use  . Smoking status: Former Smoker    Types: Cigarettes  . Smokeless tobacco: Never Used  . Tobacco comment: quit 2013  Substance Use Topics  . Alcohol use: No    Comment: occasional, not with preg  . Drug use: No     Allergies   Patient has no known allergies.   Review of Systems Review of Systems  Constitutional: Negative for activity change, appetite change, fatigue and fever.  HENT: Negative for ear pain, sinus pain, sore throat and voice change.   Eyes:  Negative for pain, redness and visual disturbance.  Respiratory: Negative for cough and shortness of breath.   Cardiovascular: Negative for chest pain and palpitations.  Gastrointestinal: Negative for abdominal pain, diarrhea and vomiting.  Endocrine: Positive for polydipsia and polyuria. Negative for cold intolerance, heat intolerance and polyphagia.  Genitourinary: Positive for frequency and genital sores. Negative for dysuria, hematuria, pelvic pain, urgency, vaginal bleeding, vaginal discharge and vaginal pain.  Musculoskeletal: Negative for arthralgias and myalgias.  Skin: Negative for rash and wound.  Neurological: Negative for syncope and headaches.     Physical Exam Triage  Vital Signs ED Triage Vitals  Enc Vitals Group     BP      Pulse      Resp      Temp      Temp src      SpO2      Weight      Height      Head Circumference      Peak Flow      Pain Score      Pain Loc      Pain Edu?      Excl. in [place]?    No data found.  Updated Vital Signs BP 122/87 (BP Location: Left Arm)   Pulse (!) 103   Temp 97.8 F (36.6 C) (Temporal)   Resp 18   LMP 06/28/2019   SpO2 98%   Visual Acuity Right Eye Distance:   Left Eye Distance:   Bilateral Distance:    Right Eye Near:   Left Eye Near:    Bilateral Near:     Physical Exam Constitutional:      General: She is not in acute distress.    Appearance: Normal appearance. She is obese. She is not ill-appearing.  HENT:     Head: Normocephalic and atraumatic.  Eyes:     General: No scleral icterus.    Conjunctiva/sclera: Conjunctivae normal.     Pupils: Pupils are equal, round, and reactive to light.  Cardiovascular:     Rate and Rhythm: Regular rhythm. Tachycardia present.     Heart sounds: No murmur. No gallop.      Comments: HR 95-102 with provider Pulmonary:     Effort: Pulmonary effort is normal. No respiratory distress.     Breath sounds: No wheezing.  Abdominal:     General: Bowel sounds are normal.      Tenderness: There is no abdominal tenderness.  Genitourinary:    Comments: Patient declined Skin:    Capillary Refill: Capillary refill takes less than 2 seconds.     Coloration: Skin is not jaundiced or pale.  Neurological:     General: No focal deficit present.     Mental Status: She is alert and oriented to person, place, and time.      UC Treatments / Results  Labs (all labs ordered are listed, but only abnormal results are displayed) Labs Reviewed  POCT FASTING CBG [facility] MANUAL ENTRY - Abnormal; Notable for the following components:      Result Value   POCT Glucose ([facility]) 511 (*)    All other components within normal limits    EKG   Radiology No results found.  Procedures Procedures (including critical care time)  Medications Ordered in UC Medications - No data to display  Initial Impression / Assessment and Plan / UC Course  I have reviewed the triage vital signs and the nursing notes.  Pertinent labs & imaging results that were available during my care of the patient were reviewed by me and considered in my medical decision making (see chart for details).     CBG done in office, reviewed by me: 511.  She has been out of her medications for greater than 1 week and this provider notes documented history of noncompliance/uncontrolled diabetes.  Will refill Metformin, Lantus, supplies for Lantus, as well as Valtrex.  Patient reportedly feels well, though this provider urged patient to go to ER for worsening of symptoms as outlined in discharge instructions.  Patient to schedule PCP appointment ASAP for care so that  diabetes can be better managed.  Return precautions discussed, patient verbalized understanding and is agreeable to plan. Final Clinical Impressions(s) / UC Diagnoses   Final diagnoses:  Genital herpes simplex, unspecified site  Type 2 diabetes mellitus with hyperglycemia, with long-term current use of insulin [facility])     Discharge Instructions     Very  important to follow up with your PCP! Please take home medications as prescribed. Go to ER for chest pain, difficulty breathing, severe abdominal pain, vomiting, diarrhea.    ED Prescriptions    Medication Sig Dispense Auth. Provider   valACYclovir (VALTREX) 500 MG tablet  (Status: Discontinued) Take 1 tablet (500 mg total) by mouth 2 (two) times daily. One tablet twice per day for 3 days as needed for acute outbreaks 3 tablet Hall-Potvin, [place], PA-C   insulin glargine (LANTUS) 100 UNIT/ML injection  (Status: Discontinued) Inject 0.2 mLs (20 Units total) into the skin at bedtime. Can stop Lantus if fasting blood sugars are 80 or less for 2 or more days 10 mL Hall-Potvin, [place], PA-C   Insulin Syringe-Needle U-100 25G X 1" 1 ML MISC  (Status: Discontinued) For 4 times a day insulin SQ, 1 month supply. Diagnosis E11.65 30 each Hall-Potvin, [place], PA-C   Lancet Devices (LANCING DEVICE) MISC  (Status: Discontinued) Use to check blood glucose twice daily.  E 11.9 1 each Hall-Potvin, [place], PA-C   metFORMIN (GLUCOPHAGE) 500 MG tablet  (Status: Discontinued) Take 2 tablets (1,000 mg total) by mouth 2 (two) times daily with a meal. 120 tablet Hall-Potvin, [place], PA-C   insulin glargine (LANTUS) 100 UNIT/ML injection Inject 0.2 mLs (20 Units total) into the skin at bedtime. Can stop Lantus if fasting blood sugars are 80 or less for 2 or more days 10 mL Hall-Potvin, [place], PA-C   Insulin Syringe-Needle U-100 25G X 1" 1 ML MISC For 4 times a day insulin SQ, 1 month supply. Diagnosis E11.65 30 each Hall-Potvin, [place], PA-C   Lancet Devices (LANCING DEVICE) MISC Use to check blood glucose twice daily.  E 11.9 1 each Hall-Potvin, [place], PA-C   metFORMIN (GLUCOPHAGE) 500 MG tablet Take 2 tablets (1,000 mg total) by mouth 2 (two) times daily with a meal. 120 tablet Hall-Potvin, [place], PA-C   valACYclovir (VALTREX) 500 MG tablet Take 1 tablet (500 mg total) by mouth 2 (two) times daily.  One tablet twice per day for 3 days as needed for acute outbreaks 3 tablet Hall-Potvin, [place], PA-C     PDMP not reviewed this encounter.   Neldon Mc [place], [place] 06/28/19 1940

## 2019-06-28 NOTE — ED Triage Notes (Signed)
Pt presents to [facility] for assessment of 3 days of herpes outbreak as well as high sugars x 1 week.  C/o having to urinate more often.

## 2019-08-01 ENCOUNTER — Inpatient Hospital Stay: Payer: Medicaid Other | Admitting: Family Medicine

## 2019-08-11 ENCOUNTER — Encounter: Payer: Self-pay | Admitting: Family Medicine

## 2019-08-18 ENCOUNTER — Encounter: Payer: Self-pay | Admitting: Family

## 2019-08-18 ENCOUNTER — Ambulatory Visit: Payer: Self-pay | Attending: Family | Admitting: Family

## 2019-08-18 ENCOUNTER — Other Ambulatory Visit: Payer: Self-pay

## 2019-08-18 VITALS — BP 115/84 | HR 94 | Temp 98.4°F | Resp 16 | Ht 68.5 in | Wt 236.0 lb

## 2019-08-18 DIAGNOSIS — M5416 Radiculopathy, lumbar region: Secondary | ICD-10-CM

## 2019-08-18 DIAGNOSIS — M546 Pain in thoracic spine: Secondary | ICD-10-CM

## 2019-08-18 DIAGNOSIS — E1165 Type 2 diabetes mellitus with hyperglycemia: Secondary | ICD-10-CM

## 2019-08-18 LAB — GLUCOSE, POCT (MANUAL RESULT ENTRY)
POC Glucose: 380 mg/dl — AB (ref 70–99)
POC Glucose: 281 mg/dl — AB (ref 70–99)

## 2019-08-18 LAB — POCT GLYCOSYLATED HEMOGLOBIN (HGB A1C)

## 2019-08-18 MED ORDER — INSULIN ASPART 100 UNIT/ML ~~LOC~~ SOLN
10.0000 [IU] | Freq: Once | SUBCUTANEOUS | Status: AC
  Administered 2019-08-18: 10 [IU] via SUBCUTANEOUS

## 2019-08-18 MED ORDER — NAPROXEN 500 MG PO TABS: 500.0000 mg | ORAL_TABLET | Freq: Two times a day (BID) | ORAL | 0 refills | Status: DC

## 2019-08-18 NOTE — Progress Notes (Signed)
Established Patient Office Visit  Subjective:  Patient ID: [name], female    DOB: 07/25/1986  Age: 34 y.o. MRN: [id number]  CC: Back Pain  HPI [name] is a 34 year-old female with a primary history of diabetes, asthma, anxiety, sickle cell trait, and headache syndrome who  presents for concerns of back pain.  1 Back Pain:  Back pain began on July 21, 2019 while helping her mother move furniture and other belongings. Back pain located at the center of the upper back radiating to bilateral shoulders and neck. Denies radiation to sacrum and bilateral thighs. Back pain throbbing and consistent daily. Awakened at least 3 times per week related to back pain. Aggravated by posture, unspecified ways in which she sits, or laying down in unspecified positions. Hot showers, heating pads, and Tylenol alleviate back pain. Pain today 7/10. Pain excluding today ranges from 7/10 to 10/10. Had previous back over 6 years ago, use of over-the-counter medication helped during that time. Last fall was over 3 years ago. Denies back surgery. Denies back trauma. Denies numbness. Denies stiffness. Denies weakness. Denies bowel and bladder incontinence. Denies groin numbness. Denies chest pain. Denies shortness of breath. Denies fever. Denies chills. Denies steroid use. Denies unexplained weight loss.  2. Diabetes: DIABETES TYPE 2 Last A1C:   Results for orders placed or performed in visit on 08/18/19  Glucose (CBG)  Result Value Ref Range   POC Glucose 380 (A) 70 - 99 mg/dl  HgB A1c  Result Value Ref Range   Hemoglobin A1C     HbA1c POC (<> result, manual entry)     HbA1c, POC (prediabetic range)     HbA1c, POC (controlled diabetic range)    Glucose (CBG)  Result Value Ref Range   POC Glucose 281 (A) 70 - 99 mg/dl    Med Adherence:  [] Yes    [x] No Medication side effects:  [x] Yes    [] No Home Monitoring?  [x] Yes    [] No Home glucose results range: 200 to 400  Diet Adherence:  [] Yes    [x] No Exercise: [] Yes    [x] No Hypoglycemic episodes?: [] Yes    [x] No Numbness of the feet? [] Yes    [x] No Retinopathy hx? [] Yes    [] No Comments: Metformin makes her feel sick sometimes when she takes it. Takes Metformin and Lantus every other day. Admits missing doses of medications. Last day she took medications was August 16, 2019. Attempts to spread out glucometer testing supplies for financial purposes. Typically checks blood sugar when she feels fatigue or dizzy, the results of those readings are normally high. Denies tingling and numbness in hands and feet.   Past Medical History:  Diagnosis Date  . Asthma   . Diabetes mellitus without complication ([place])   . 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 approx 3-4cm at time of 01/2016 c-section  . Herpes   . Trichomonas vaginitis     Past Surgical History:  Procedure Laterality Date  . CESAREAN SECTION N/A 01/08/2016   Procedure: CESAREAN SECTION;  Surgeon: Aletha Halim, MD;  Location: [place];  Service: Obstetrics;  Laterality: N/A;  . DILATION AND CURETTAGE OF UTERUS      Family History  Problem Relation Age of Onset  . Diabetes Mother   . Hypertension Mother   . Hyperlipidemia Mother   . Heart disease Mother   . Diabetes Sister   .  Diabetes Brother     Social History   Socioeconomic History  . Marital status: Legally Separated    Spouse name: Not on file  . Number of children: Not on file  . Years of education: Not on file  . Highest education level: Not on file  Occupational History  . Not on file  Tobacco Use  . Smoking status: Former Smoker    Types: Cigarettes  . Smokeless tobacco: Never Used  . Tobacco comment: quit 2013  Substance and Sexual Activity  . Alcohol use: No    Comment: occasional, not with preg  . Drug use: No  . Sexual activity: Yes    Birth control/protection: Implant    Comment: last had intercourse  12/05/15  Other Topics Concern  . Not on  file  Social History Narrative  . Not on file   Social Determinants of Health   Financial Resource Strain:   . Difficulty of Paying Living Expenses: Not on file  Food Insecurity:   . Worried About Charity fundraiser in the Last Year: Not on file  . Ran Out of Food in the Last Year: Not on file  Transportation Needs:   . Lack of Transportation (Medical): Not on file  . Lack of Transportation (Non-Medical): Not on file  Physical Activity:   . Days of Exercise per Week: Not on file  . Minutes of Exercise per Session: Not on file  Stress:   . Feeling of Stress : Not on file  Social Connections:   . Frequency of Communication with Friends and Family: Not on file  . Frequency of Social Gatherings with Friends and Family: Not on file  . Attends Religious Services: Not on file  . Active Member of Clubs or Organizations: Not on file  . Attends Archivist Meetings: Not on file  . Marital Status: Not on file  Intimate Partner Violence:   . Fear of Current or Ex-Partner: Not on file  . Emotionally Abused: Not on file  . Physically Abused: Not on file  . Sexually Abused: Not on file    Outpatient Medications Prior to Visit  Medication Sig Dispense Refill  . ACCU-CHEK GUIDE test strip USE AS DIRECTED TO CHECK BLOOD GLUCOSE TWICE DAILY 100 strip 1  . acetaminophen (TYLENOL) 500 MG tablet Take 1 tablet (500 mg total) by mouth every 8 (eight) hours as needed for moderate pain. 20 tablet 0  . albuterol (PROVENTIL HFA;VENTOLIN HFA) 108 (90 Base) MCG/ACT inhaler Inhale 2 puffs into the lungs every 6 (six) hours as needed for wheezing or shortness of breath. 1 Inhaler 2  . blood glucose meter kit and supplies KIT Dispense based on patient and insurance preference. Use up to four times daily as directed. (FOR ICD-10 E11.9). 1 each 0  . etonogestrel (NEXPLANON) 68 MG IMPL implant 1 each by Subdermal route once.    Marland Kitchen ibuprofen (ADVIL,MOTRIN) 600 MG tablet Take 1 tablet (600 mg total) by  mouth every 8 (eight) hours as needed for moderate pain. 20 tablet 0  . insulin glargine (LANTUS) 100 UNIT/ML injection Inject 0.2 mLs (20 Units total) into the skin at bedtime. Can stop Lantus if fasting blood sugars are 80 or less for 2 or more days 10 mL 0  . metFORMIN (GLUCOPHAGE) 500 MG tablet Take 2 tablets (1,000 mg total) by mouth 2 (two) times daily with a meal. 784 tablet 0  . TRULICITY 6.96 EX/5.2WU SOPN INJECT 0.75 MG INTO THE SKIN ONCE A  WEEK. ON WEDNESDAYS. 2 mL 2  . valACYclovir (VALTREX) 500 MG tablet Take 1 tablet (500 mg total) by mouth 2 (two) times daily. One tablet twice per day for 3 days as needed for acute outbreaks 3 tablet 0  . Insulin Syringe-Needle U-100 25G X 1" 1 ML MISC For 4 times a day insulin SQ, 1 month supply. Diagnosis E11.65 30 each 0  . ketoconazole (NIZORAL) 2 % cream Apply 1 application topically 2 (two) times daily. 15 g 0  . Lancet Devices (LANCING DEVICE) MISC Use to check blood glucose twice daily.  E 11.9 1 each 0  . Continuous Blood Gluc Receiver (FREESTYLE LIBRE 14 DAY READER) DEVI 1 application by Does not apply route 2 (two) times daily. (Patient not taking: Reported on 08/18/2019) 1 Device 1  . Continuous Blood Gluc Sensor (FREESTYLE LIBRE 14 DAY SENSOR) MISC 1 Device by Does not apply route every 14 (fourteen) days. 6 each 1   No facility-administered medications prior to visit.    No Known Allergies  ROS Negative excluding statements from above.    Objective:    Physical Exam  BP 115/84 (BP Location: Right Arm)   Pulse 94   Temp 98.4 F (36.9 C)   Resp 16   Ht 5' 8.5" (1.74 m)   Wt 236 lb (107 kg)   LMP 08/17/2019 (Exact Date)   SpO2 99%   BMI 35.36 kg/m  Wt Readings from Last 3 Encounters:  08/18/19 236 lb (107 kg)  01/27/19 240 lb (108.9 kg)  12/29/18 264 lb (119.7 kg)   General appearance: alert and cooperative Neck: no adenopathy, no carotid bruit, no JVD, supple, symmetrical, trachea midline and thyroid not enlarged,  symmetric, no tenderness/mass/nodules Back: symmetric, no curvature. ROM normal. No CVA tenderness. Lungs: clear to auscultation bilaterally and normal percussion bilaterally Heart: regular rate and rhythm, S1, S2 normal, no murmur, click, rub or gallop Extremities: extremities normal, atraumatic, no cyanosis or edema Lymph nodes: Cervical, supraclavicular, and axillary nodes normal. Neurologic: Grossly normal   Health Maintenance Due  Topic Date Due  . OPHTHALMOLOGY EXAM  01/22/1997  . PAP SMEAR-Modifier  12/02/2018  . FOOT EXAM  01/11/2019  . URINE MICROALBUMIN  02/03/2019  . INFLUENZA VACCINE  03/04/2019  . HEMOGLOBIN A1C  07/01/2019    There are no preventive care reminders to display for this patient.  Lab Results  Component Value Date   TSH 2.130 01/10/2018   Lab Results  Component Value Date   WBC 7.5 05/21/2019   HGB 12.6 05/21/2019   HCT 38.0 05/21/2019   MCV 80.2 05/21/2019   PLT 331 05/21/2019   Lab Results  Component Value Date   NA 131 (L) 05/21/2019   K 4.5 05/21/2019   CO2 23 05/21/2019   GLUCOSE 480 (H) 05/21/2019   BUN 9 05/21/2019   CREATININE 0.92 05/21/2019   BILITOT 0.6 01/27/2019   ALKPHOS 103 01/27/2019   AST 21 01/27/2019   ALT 14 01/27/2019   PROT 7.2 01/27/2019   ALBUMIN 4.1 01/27/2019   CALCIUM 9.1 05/21/2019   ANIONGAP 12 05/21/2019   No results found for: CHOL No results found for: HDL No results found for: LDLCALC No results found for: TRIG No results found for: [facility] Lab Results  Component Value Date   HGBA1C  08/18/2019     Comment:     >15      Assessment & Plan:  1. Lumbar Radiculopathy:  -Use heat pads to decrease spasms. Place cloth  beneath heating pad to prevent skin injury. -Naproxen (NAPROSYN); Take 1 tablet (500 mg total) by mouth 2 (two) times daily with a meal -Follow-up with attending physician Dr. Chapman Fitch in 2 weeks if symptoms persist or worsen  2. Acute Bilateral Thoracic Back Pain:  -Naproxen  (NAPROSYN); Take 1 tablet (500 mg total) by mouth 2 (two) times daily with a meal -Follow-up with attending physician Dr. Chapman Fitch in 2 weeks if symptoms persist or worsen  3. Diabetes: -Continue Insulin Glargine (LANTUS); Inject 0.2 mLs (20 Units total) into the skin at bedtime. -Continue Trulicity; INJECT 0.30 MG INTO THE SKIN ONCE A WEEK. ON WEDNESDAYS -Continue Metformin (GLUCOPHAGE); Take 2 tablets (1,000 mg total) by mouth 2 (two) times daily with a meal. -A1C recheck again in 3 months. Keep log of blood glucose and bring to appointment. -Urinalysis sent to lab, pending results -Administer 10 units Insulin Aspart (NOVOLOG) subcutaneous once in clinic -Patient have been counseled extensively about nutrition and exercise. Other issues discussed during this visit include: low cholesterol diet, weight control and daily exercise, foot care, annual eye examinations at Ophthalmology, importance of adherence with medications and regular follow-up. We also discussed long term complications of uncontrolled diabetes. -Follow a Healthy Eating Plan - You can do it! -Limit sugary drinks.  Avoid sodas, sweet tea, sport or energy drinks, or fruit drinks.  Drink water, lo-fat milk, or diet drinks. -Limit snack foods.   Cut back on candy, cake, cookies, chips, ice cream.  These are a special treat, only in small amounts. -Eat plenty of vegetables.  Especially dark green, red, and orange vegetables. Aim for at least 3 servings a day. More is better! -Limit "white" bread, "white" pasta, "white" rice.   Choose "100% whole grain" products, brown or wild rice. -Avoid fatty meats. Try "Meatless Monday" and choose eggs or beans one day a week.  When eating meat, choose lean meats like chicken, [place], and fish.  Grill, broil, or bake meats instead of frying, and eat poultry without the skin. -Beer, wine and liquor have calories.  If you can safely drink alcohol, limit to 1 drink per day for women, 2 drinks for  men   Problem List Items Addressed This Visit    None    Visit Diagnoses    Uncontrolled type 2 diabetes mellitus with hyperglycemia (HCC)    -  Primary   Relevant Orders   Glucose (CBG) (Completed)   HgB A1c (Completed)   Lumbar radiculopathy       Acute bilateral thoracic back pain          No orders of the defined types were placed in this encounter.   Follow-up: No follow-ups on file.    Camillia Herter, NP

## 2019-08-18 NOTE — Patient Instructions (Addendum)
Naproxen for back pain. Follow-up 2 weeks for back pain. Take diabetic medications as prescribed. Follow-up 3 months for diabetes and A1C. Diabetes Basics  Diabetes (diabetes mellitus) is a long-term (chronic) disease. It occurs when the body does not properly use sugar (glucose) that is released from food after you eat. Diabetes may be caused by one or both of these problems:  Your pancreas does not make enough of a hormone called insulin.  Your body does not react in a normal way to insulin that it makes. Insulin lets sugars (glucose) go into cells in your body. This gives you energy. If you have diabetes, sugars cannot get into cells. This causes high blood sugar (hyperglycemia). Follow these instructions at home: How is diabetes treated? You may need to take insulin or other diabetes medicines daily to keep your blood sugar in balance. Take your diabetes medicines every day as told by your doctor. List your diabetes medicines here: Diabetes medicines  Name of medicine: ______________________________ ? Amount (dose): _______________ Time (a.m./p.m.): _______________ Notes: ___________________________________  Name of medicine: ______________________________ ? Amount (dose): _______________ Time (a.m./p.m.): _______________ Notes: ___________________________________  Name of medicine: ______________________________ ? Amount (dose): _______________ Time (a.m./p.m.): _______________ Notes: ___________________________________ If you use insulin, you will learn how to give yourself insulin by injection. You may need to adjust the amount based on the food that you eat. List the types of insulin you use here: Insulin  Insulin type: ______________________________ ? Amount (dose): _______________ Time (a.m./p.m.): _______________ Notes: ___________________________________  Insulin type: ______________________________ ? Amount (dose): _______________ Time (a.m./p.m.): _______________ Notes:  ___________________________________  Insulin type: ______________________________ ? Amount (dose): _______________ Time (a.m./p.m.): _______________ Notes: ___________________________________  Insulin type: ______________________________ ? Amount (dose): _______________ Time (a.m./p.m.): _______________ Notes: ___________________________________  Insulin type: ______________________________ ? Amount (dose): _______________ Time (a.m./p.m.): _______________ Notes: ___________________________________ How do I manage my blood sugar?  Check your blood sugar levels using a blood glucose monitor as directed by your doctor. Your doctor will set treatment goals for you. Generally, you should have these blood sugar levels:  Before meals (preprandial): 80-130 mg/dL (4.4-7.2 mmol/L).  After meals (postprandial): below 180 mg/dL (10 mmol/L).  A1c level: less than 7%. Write down the times that you will check your blood sugar levels: Blood sugar checks  Time: _______________ Notes: ___________________________________  Time: _______________ Notes: ___________________________________  Time: _______________ Notes: ___________________________________  Time: _______________ Notes: ___________________________________  Time: _______________ Notes: ___________________________________  Time: _______________ Notes: ___________________________________  What do I need to know about low blood sugar? Low blood sugar is called hypoglycemia. This is when blood sugar is at or below 70 mg/dL (3.9 mmol/L). Symptoms may include:  Feeling: ? Hungry. ? Worried or nervous (anxious). ? Sweaty and clammy. ? Confused. ? Dizzy. ? Sleepy. ? Sick to your stomach (nauseous).  Having: ? A fast heartbeat. ? A headache. ? A change in your vision. ? Tingling or no feeling (numbness) around the mouth, lips, or tongue. ? Jerky movements that you cannot control (seizure).  Having trouble with: ? Moving  (coordination). ? Sleeping. ? Passing out (fainting). ? Getting upset easily (irritability). Treating low blood sugar To treat low blood sugar, eat or drink something sugary right away. If you can think clearly and swallow safely, follow the 15:15 rule:  Take 15 grams of a fast-acting carb (carbohydrate). Talk with your doctor about how much you should take.  Some fast-acting carbs are: ? Sugar tablets (glucose pills). Take 3-4 glucose pills. ? 6-8 pieces of hard candy. ? 4-6 oz (120-150 mL)  of fruit juice. ? 4-6 oz (120-150 mL) of regular (not diet) soda. ? 1 Tbsp (15 mL) honey or sugar.  Check your blood sugar 15 minutes after you take the carb.  If your blood sugar is still at or below 70 mg/dL (3.9 mmol/L), take 15 grams of a carb again.  If your blood sugar does not go above 70 mg/dL (3.9 mmol/L) after 3 tries, get help right away.  After your blood sugar goes back to normal, eat a meal or a snack within 1 hour. Treating very low blood sugar If your blood sugar is at or below 54 mg/dL (3 mmol/L), you have very low blood sugar (severe hypoglycemia). This is an emergency. Do not wait to see if the symptoms will go away. Get medical help right away. Call your local emergency services (911 in the [place] Do not drive yourself to the hospital. Questions to ask your health care provider  Do I need to meet with a diabetes educator?  What equipment will I need to care for myself at home?  What diabetes medicines do I need? When should I take them?  How often do I need to check my blood sugar?  What number can I call if I have questions?  When is my next doctor's visit?  Where can I find a support group for people with diabetes? Where to find more information  American Diabetes Association: www.diabetes.org  American Association of Diabetes Educators: www.diabeteseducator.org/patient-resources Contact a doctor if:  Your blood sugar is at or above 240 mg/dL (13.3 mmol/L) for  2 days in a row.  You have been sick or have had a fever for 2 days or more, and you are not getting better.  You have any of these problems for more than 6 hours: ? You cannot eat or drink. ? You feel sick to your stomach (nauseous). ? You throw up (vomit). ? You have watery poop (diarrhea). Get help right away if:  Your blood sugar is lower than 54 mg/dL (3 mmol/L).  You get confused.  You have trouble: ? Thinking clearly. ? Breathing. Summary  Diabetes (diabetes mellitus) is a long-term (chronic) disease. It occurs when the body does not properly use sugar (glucose) that is released from food after digestion.  Take insulin and diabetes medicines as told.  Check your blood sugar every day, as often as told.  Keep all follow-up visits as told by your doctor. This is important. This information is not intended to replace advice given to you by your health care provider. Make sure you discuss any questions you have with your health care provider. Document Revised: 04/12/2019 Document Reviewed: 10/22/2017 Elsevier Patient Education  [employer].   Diabetes Mellitus and Nutrition, Adult When you have diabetes (diabetes mellitus), it is very important to have healthy eating habits because your blood sugar (glucose) levels are greatly affected by what you eat and drink. Eating healthy foods in the appropriate amounts, at about the same times every day, can help you:  Control your blood glucose.  Lower your risk of heart disease.  Improve your blood pressure.  Reach or maintain a healthy weight. Every person with diabetes is different, and each person has different needs for a meal plan. Your health care provider may recommend that you work with a diet and nutrition specialist (dietitian) to make a meal plan that is best for you. Your meal plan may vary depending on factors such as:  The calories you need.  The  medicines you take.  Your weight.  Your blood glucose,  blood pressure, and cholesterol levels.  Your activity level.  Other health conditions you have, such as heart or kidney disease. How do carbohydrates affect me? Carbohydrates, also called carbs, affect your blood glucose level more than any other type of food. Eating carbs naturally raises the amount of glucose in your blood. Carb counting is a method for keeping track of how many carbs you eat. Counting carbs is important to keep your blood glucose at a healthy level, especially if you use insulin or take certain oral diabetes medicines. It is important to know how many carbs you can safely have in each meal. This is different for every person. Your dietitian can help you calculate how many carbs you should have at each meal and for each snack. Foods that contain carbs include:  Bread, cereal, rice, pasta, and crackers.  Potatoes and corn.  Peas, beans, and lentils.  Milk and yogurt.  Fruit and juice.  Desserts, such as cakes, cookies, ice cream, and candy. How does alcohol affect me? Alcohol can cause a sudden decrease in blood glucose (hypoglycemia), especially if you use insulin or take certain oral diabetes medicines. Hypoglycemia can be a life-threatening condition. Symptoms of hypoglycemia (sleepiness, dizziness, and confusion) are similar to symptoms of having too much alcohol. If your health care provider says that alcohol is safe for you, follow these guidelines:  Limit alcohol intake to no more than 1 drink per day for nonpregnant women and 2 drinks per day for men. One drink equals 12 oz of beer, 5 oz of wine, or 1 oz of hard liquor.  Do not drink on an empty stomach.  Keep yourself hydrated with water, diet soda, or unsweetened iced tea.  Keep in mind that regular soda, juice, and other mixers may contain a lot of sugar and must be counted as carbs. What are tips for following this plan?  Reading food labels  Start by checking the serving size on the "Nutrition  Facts" label of packaged foods and drinks. The amount of calories, carbs, fats, and other nutrients listed on the label is based on one serving of the item. Many items contain more than one serving per package.  Check the total grams (g) of carbs in one serving. You can calculate the number of servings of carbs in one serving by dividing the total carbs by 15. For example, if a food has 30 g of total carbs, it would be equal to 2 servings of carbs.  Check the number of grams (g) of saturated and trans fats in one serving. Choose foods that have low or no amount of these fats.  Check the number of milligrams (mg) of salt (sodium) in one serving. Most people should limit total sodium intake to less than 2,300 mg per day.  Always check the nutrition information of foods labeled as "low-fat" or "nonfat". These foods may be higher in added sugar or refined carbs and should be avoided.  Talk to your dietitian to identify your daily goals for nutrients listed on the label. Shopping  Avoid buying canned, premade, or processed foods. These foods tend to be high in fat, sodium, and added sugar.  Shop around the outside edge of the grocery store. This includes fresh fruits and vegetables, bulk grains, fresh meats, and fresh dairy. Cooking  Use low-heat cooking methods, such as baking, instead of high-heat cooking methods like deep frying.  Cook using healthy oils, such as  olive, canola, or sunflower oil.  Avoid cooking with butter, cream, or high-fat meats. Meal planning  Eat meals and snacks regularly, preferably at the same times every day. Avoid going long periods of time without eating.  Eat foods high in fiber, such as fresh fruits, vegetables, beans, and whole grains. Talk to your dietitian about how many servings of carbs you can eat at each meal.  Eat 4-6 ounces (oz) of lean protein each day, such as lean meat, chicken, fish, eggs, or tofu. One oz of lean protein is equal to: ? 1 oz of  meat, chicken, or fish. ? 1 egg. ?  cup of tofu.  Eat some foods each day that contain healthy fats, such as avocado, nuts, seeds, and fish. Lifestyle  Check your blood glucose regularly.  Exercise regularly as told by your health care provider. This may include: ? 150 minutes of moderate-intensity or vigorous-intensity exercise each week. This could be brisk walking, biking, or water aerobics. ? Stretching and doing strength exercises, such as yoga or weightlifting, at least 2 times a week.  Take medicines as told by your health care provider.  Do not use any products that contain nicotine or tobacco, such as cigarettes and e-cigarettes. If you need help quitting, ask your health care provider.  Work with a Social worker or diabetes educator to identify strategies to manage stress and any emotional and social challenges. Questions to ask a health care provider  Do I need to meet with a diabetes educator?  Do I need to meet with a dietitian?  What number can I call if I have questions?  When are the best times to check my blood glucose? Where to find more information:  American Diabetes Association: diabetes.org  Academy of Nutrition and Dietetics: www.eatright.[employer] of Diabetes and Digestive and Kidney Diseases (NIH): [employer] Summary  A healthy meal plan will help you control your blood glucose and maintain a healthy lifestyle.  Working with a diet and nutrition specialist (dietitian) can help you make a meal plan that is best for you.  Keep in mind that carbohydrates (carbs) and alcohol have immediate effects on your blood glucose levels. It is important to count carbs and to use alcohol carefully. This information is not intended to replace advice given to you by your health care provider. Make sure you discuss any questions you have with your health care provider. Document Revised: 07/02/2017 Document Reviewed: 08/24/2016 Elsevier Patient  Education  2020 [employer].

## 2019-08-18 NOTE — Progress Notes (Signed)
C/o constant upper back pain x 4 wk radiating to both shoulder . Pain 7/10 worsening with movement. Stated little relief with OTC meds.  [id number] A1C >15

## 2019-08-19 LAB — URINALYSIS
Bilirubin, UA: NEGATIVE
Nitrite, UA: NEGATIVE
Specific Gravity, UA: >=1.03 — AB (ref 1.005–1.030)
Urobilinogen, Ur: 0.2 mg/dL (ref 0.2–1.0)
pH, UA: 5.5 (ref 5.0–7.5)

## 2019-08-21 ENCOUNTER — Telehealth: Payer: Self-pay | Admitting: *Deleted

## 2019-08-21 NOTE — Progress Notes (Signed)
Please call patient to update her of labs drawn on Friday. Inform patient that she needs to report to the Emergency Department today. Her urinalysis results indicate she has protein and ketones in her urine which requires immediate medical attention (may indicate DKA). Her urinalysis also has red blood cells (patient stated that she was menstruating during the office visit), leukocytes (may indicate UTI or infection), and glucose (related to diabetes) which require medical attention.

## 2019-08-21 NOTE — Telephone Encounter (Signed)
MA UTR or leave VM for patient on any contacts. Please call patient to update her of labs drawn on Friday. Inform patient that she needs to report to the Emergency Department today. Her urinalysis results indicate she has protein and ketones in her urine which requires immediate medical attention (may indicate DKA). Her urinalysis also has red blood cells (patient stated that she was menstruating during the office visit), leukocytes (may indicate UTI or infection), and glucose (related to diabetes) which require medical attention.

## 2019-08-21 NOTE — Telephone Encounter (Signed)
-----   Message from Camillia Herter, NP sent at 08/21/2019  7:48 AM EST ----- Please call patient to update her of labs drawn on Friday. Inform patient that she needs to report to the Emergency Department today. Her urinalysis results indicate she has protein and ketones in her urine which requires immediate medical attention  (may indicate DKA). Her urinalysis also has red blood cells (patient stated that she was menstruating during the office visit), leukocytes (may indicate UTI or infection), and glucose (related to diabetes) which require medical attention.

## 2019-08-23 ENCOUNTER — Ambulatory Visit: Payer: Medicaid Other | Admitting: Family Medicine

## 2019-09-01 ENCOUNTER — Encounter (HOSPITAL_COMMUNITY): Payer: Self-pay | Admitting: Pharmacy Technician

## 2019-09-01 ENCOUNTER — Emergency Department (HOSPITAL_COMMUNITY)
Admission: EM | Admit: 2019-09-01 | Discharge: 2019-09-01 | Disposition: A | Discharge status: Home / Self Care | Payer: Medicaid Other | Attending: Emergency Medicine | Admitting: Emergency Medicine

## 2019-09-01 ENCOUNTER — Ambulatory Visit: Payer: Self-pay | Attending: Family | Admitting: Family

## 2019-09-01 ENCOUNTER — Other Ambulatory Visit: Payer: Self-pay

## 2019-09-01 DIAGNOSIS — Z87891 Personal history of nicotine dependence: Secondary | ICD-10-CM | POA: Insufficient documentation

## 2019-09-01 DIAGNOSIS — R739 Hyperglycemia, unspecified: Secondary | ICD-10-CM

## 2019-09-01 DIAGNOSIS — E1165 Type 2 diabetes mellitus with hyperglycemia: Secondary | ICD-10-CM

## 2019-09-01 DIAGNOSIS — E119 Type 2 diabetes mellitus without complications: Secondary | ICD-10-CM | POA: Insufficient documentation

## 2019-09-01 DIAGNOSIS — J45909 Unspecified asthma, uncomplicated: Secondary | ICD-10-CM | POA: Insufficient documentation

## 2019-09-01 DIAGNOSIS — Z79899 Other long term (current) drug therapy: Secondary | ICD-10-CM | POA: Insufficient documentation

## 2019-09-01 DIAGNOSIS — M5416 Radiculopathy, lumbar region: Secondary | ICD-10-CM

## 2019-09-01 DIAGNOSIS — Z794 Long term (current) use of insulin: Secondary | ICD-10-CM | POA: Insufficient documentation

## 2019-09-01 DIAGNOSIS — M546 Pain in thoracic spine: Secondary | ICD-10-CM

## 2019-09-01 DIAGNOSIS — R109 Unspecified abdominal pain: Secondary | ICD-10-CM | POA: Insufficient documentation

## 2019-09-01 LAB — URINALYSIS, ROUTINE W REFLEX MICROSCOPIC
Bilirubin Urine: NEGATIVE
Glucose, UA: >=500 mg/dL — AB
Hgb urine dipstick: NEGATIVE
Ketones, ur: NEGATIVE mg/dL
Leukocytes,Ua: NEGATIVE
Nitrite: NEGATIVE
Protein, ur: NEGATIVE mg/dL
Specific Gravity, Urine: 1.022 (ref 1.005–1.030)
pH: 6 (ref 5.0–8.0)

## 2019-09-01 LAB — CBC WITH DIFFERENTIAL/PLATELET
Abs Immature Granulocytes: 0.02 10*3/uL (ref 0.00–0.07)
Basophils Absolute: 0.1 10*3/uL (ref 0.0–0.1)
Basophils Relative: 1 %
Eosinophils Absolute: 0.1 10*3/uL (ref 0.0–0.5)
Eosinophils Relative: 1 %
HCT: 38.6 % (ref 36.0–46.0)
Hemoglobin: 12.8 g/dL (ref 12.0–15.0)
Immature Granulocytes: 0 %
Lymphocytes Relative: 45 %
Lymphs Abs: 2.7 10*3/uL (ref 0.7–4.0)
MCH: 26.6 pg (ref 26.0–34.0)
MCHC: 33.2 g/dL (ref 30.0–36.0)
MCV: 80.1 fL (ref 80.0–100.0)
Monocytes Absolute: 0.3 10*3/uL (ref 0.1–1.0)
Monocytes Relative: 5 %
Neutro Abs: 2.8 10*3/uL (ref 1.7–7.7)
Neutrophils Relative %: 48 %
Platelets: 305 10*3/uL (ref 150–400)
RBC: 4.82 MIL/uL (ref 3.87–5.11)
RDW: 12.8 % (ref 11.5–15.5)
WBC: 6 10*3/uL (ref 4.0–10.5)
nRBC: 0 % (ref 0.0–0.2)

## 2019-09-01 LAB — I-STAT BETA HCG BLOOD, ED (MC, WL, AP ONLY): I-stat hCG, quantitative: <5 m[IU]/mL (ref <5)

## 2019-09-01 LAB — COMPREHENSIVE METABOLIC PANEL
ALT: 11 U/L (ref 0–44)
AST: 11 U/L — ABNORMAL LOW (ref 15–41)
Albumin: 3.7 g/dL (ref 3.5–5.0)
Alkaline Phosphatase: 83 U/L (ref 38–126)
Anion gap: 8 (ref 5–15)
BUN: 5 mg/dL — ABNORMAL LOW (ref 6–20)
CO2: 26 mmol/L (ref 22–32)
Calcium: 8.9 mg/dL (ref 8.9–10.3)
Chloride: 96 mmol/L — ABNORMAL LOW (ref 98–111)
Creatinine, Ser: 0.77 mg/dL (ref 0.44–1.00)
GFR calc Af Amer: >60 mL/min (ref >60)
GFR calc non Af Amer: >60 mL/min (ref >60)
Glucose, Bld: 471 mg/dL — ABNORMAL HIGH (ref 70–99)
Potassium: 4.5 mmol/L (ref 3.5–5.1)
Sodium: 130 mmol/L — ABNORMAL LOW (ref 135–145)
Total Bilirubin: 0.7 mg/dL (ref 0.3–1.2)
Total Protein: 6.5 g/dL (ref 6.5–8.1)

## 2019-09-01 LAB — CBG MONITORING, ED: Glucose-Capillary: 227 mg/dL — ABNORMAL HIGH (ref 70–99)

## 2019-09-01 MED ORDER — SODIUM CHLORIDE 0.9 % IV BOLUS
1000.0000 mL | Freq: Once | INTRAVENOUS | Status: AC
  Administered 2019-09-01: 15:00:00 1000 mL via INTRAVENOUS

## 2019-09-01 MED ORDER — INSULIN GLARGINE 100 UNIT/ML ~~LOC~~ SOLN: 20.0000 [IU] | Freq: Every day | SUBCUTANEOUS | 0 refills | Status: DC

## 2019-09-01 MED ORDER — INSULIN ASPART 100 UNIT/ML ~~LOC~~ SOLN
15.0000 [IU] | Freq: Once | SUBCUTANEOUS | Status: AC
  Administered 2019-09-01: 15 [IU] via SUBCUTANEOUS

## 2019-09-01 NOTE — ED Provider Notes (Signed)
Pt had + ketones in urine a week and a half ago - is not having any sx today but is coming in to get checked for DKA - has been using meds per Rx - has no other c/o  Abd soft - lungs clear, MMM, stable appearing  Labs and anticipate d/c shy of DKA dx.  Medical screening examination/treatment/procedure(s) were conducted as a shared visit with non-physician practitioner(s) and myself.  I personally evaluated the patient during the encounter.  Clinical Impression:   Final diagnoses:  Hyperglycemia         Noemi Chapel, MD 09/01/19 1931

## 2019-09-01 NOTE — ED Triage Notes (Signed)
Pt arrives pov with reports of having ketones and protein in her urine and was told to come here. Pt endorses nausea.

## 2019-09-01 NOTE — Discharge Instructions (Signed)
Please take your insulin as prescribed. Return to the ED for worsening chest pain, fevers, chills, or inability to tolerate oral medications or fluids.

## 2019-09-01 NOTE — ED Notes (Signed)
Pt d/c home per MD order. Discharge summary reviewed with pt, pt verbalizes understanding. Ambulatory off unit.  

## 2019-09-01 NOTE — Progress Notes (Signed)
Virtual Visit via Telephone Note  I connected with [name], on 09/01/2019 at 9:10 AM by telephone due to the COVID-19 pandemic and verified that I am speaking with the correct person using two identifiers.   Consent: I discussed the limitations, risks, security and privacy concerns of performing an evaluation and management service by telephone and the availability of in person appointments. I also discussed with the patient that there may be a patient responsible charge related to this service. The patient expressed understanding and agreed to proceed.  Due to current restrictions/limitations of in-office visits due to the COVID-19 pandemic, this scheduled clinical appointment was converted to a telehealth visit.   I discussed the limitations, risks, security and privacy concerns of performing an evaluation and management service by telephone and the availability of in person appointments. I also discussed with the patient that there may be a patient responsible charge related to this service. The patient expressed understanding and agreed to proceed.  Location of Patient: Home  Location of Provider: Clinic   Persons participating in Telemedicine visit: Raul Del, NP Orlan Leavens, [place]   History of Present Illness: 1. BACK PAIN FOLLOW-UP:  Last seen on 08/18/2019. During that encounter patient was prescribed Naproxen for back pain. Reports that she did not take the Naproxen for back pain because she not have transportation to pick up the medication. States she is going to come to the clinic pharmacy today to pick up medication. Reports back pain has gotten better and not as consistent as it was 2 weeks ago. Denies taking over-the-counter medications. Has been doing heating pads which does help. Rates pain 1/10 currently. Pain 6/10 with consistent daily activities. Admits diarrhea. Denies incontinence. Denies groin and thigh pain with numbeness.   2. DIABETES  FOLLOW-UP:   Last seen on 08/18/2019. During that encounter patient was encouraged to continue Lantus, Trulicity, and Metformin. Reports that she has been fasting recently for weight and diabetes management. Would like to know if Keto Diet is good for diabetics.   Checks blood sugar at least once per day with ranges in the 200's to 300's. Reports that she has not taken Lantus since last week because she dropped the vial and it broke. Requesting Lantus replacement. Reports that she does not take diabetes daily as prescribed but tends to take them every other day. Admits feeling dizzy and nauseated sometimes.   States that CMA called her 2 weeks ago with urinalysis results with instructions to report to the Emergency Department. Says that she was unable to go to the Emergency Department during that time because she did not have transportation. Reports that she plans to go to the Emergency Department on today.    Past Medical History:  Diagnosis Date  . Asthma   . Diabetes mellitus without complication ([place])   . 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 approx 3-4cm at time of 01/2016 c-section  . Herpes   . Trichomonas vaginitis    No Known Allergies  Current Outpatient Medications on File Prior to Visit  Medication Sig Dispense Refill  . ACCU-CHEK GUIDE test strip USE AS DIRECTED TO CHECK BLOOD GLUCOSE TWICE DAILY 100 strip 1  . albuterol (PROVENTIL HFA;VENTOLIN HFA) 108 (90 Base) MCG/ACT inhaler Inhale 2 puffs into the lungs every 6 (six) hours as needed for wheezing or shortness of breath. 1 Inhaler 2  . blood glucose meter kit and supplies KIT Dispense based on patient and insurance preference. Use up  to four times daily as directed. (FOR ICD-10 E11.9). 1 each 0  . etonogestrel (NEXPLANON) 68 MG IMPL implant 1 each by Subdermal route once.    Marland Kitchen ibuprofen (ADVIL,MOTRIN) 600 MG tablet Take 1 tablet (600 mg total) by mouth every 8 (eight) hours as needed for moderate pain. 20 tablet 0   . insulin glargine (LANTUS) 100 UNIT/ML injection Inject 0.2 mLs (20 Units total) into the skin at bedtime. Can stop Lantus if fasting blood sugars are 80 or less for 2 or more days 10 mL 0  . Insulin Syringe-Needle U-100 25G X 1" 1 ML MISC For 4 times a day insulin SQ, 1 month supply. Diagnosis E11.65 30 each 0  . ketoconazole (NIZORAL) 2 % cream Apply 1 application topically 2 (two) times daily. 15 g 0  . Lancet Devices (LANCING DEVICE) MISC Use to check blood glucose twice daily.  E 11.9 1 each 0  . metFORMIN (GLUCOPHAGE) 500 MG tablet Take 2 tablets (1,000 mg total) by mouth 2 (two) times daily with a meal. 120 tablet 0  . naproxen (NAPROSYN) 500 MG tablet Take 1 tablet (500 mg total) by mouth 2 (two) times daily with a meal. 30 tablet 0  . TRULICITY 2.53 GU/4.4IH SOPN INJECT 0.75 MG INTO THE SKIN ONCE A WEEK. ON WEDNESDAYS. 2 mL 2  . valACYclovir (VALTREX) 500 MG tablet Take 1 tablet (500 mg total) by mouth 2 (two) times daily. One tablet twice per day for 3 days as needed for acute outbreaks 3 tablet 0   No current facility-administered medications on file prior to visit.    Observations/Objective: Alert and oriented x 3. Not in acute distress. Physical examination not completed as this is a telemedicine visit.  Assessment and Plan: 1.  ACUTE BILATERAL THORACIC BACK PAIN:  -Naproxen (NAPROSYN); Take 1 tablet (500 mg total) by mouth 2 (two) times daily with a meal  2. LUMBAR RADICULOPATHY:  -Naproxen (NAPROSYN); Take 1 tablet (500 mg total) by mouth 2 (two) times daily with a   3. DIABETES: -Continue insulin glargine (LANTUS); Inject 0.2 mLs (20 Units total) into the skin at bedtime -Continue Trulicity; INJECT 4.74 MG INTO THE SKIN ONCE A WEEK. ON WEDNESDAYS  -Continue metformin (GLUCOPHAGE); Take 2 tablets (1,000 mg total) by mouth 2 (two) times daily with a meal. -Refill for Lantus sent to clinic pharmacy -Hemoglobin A1C check needed in 3 months -Discussed with patient that she  should report to the Emergency Department on today related to abnormal urinalysis which indicated ketones and protein.  -Ambulatory referral for Diabetes Self Management    Follow Up Instructions: Follow-up with attending physician if symptoms do not improve or worsen.   I discussed the assessment and treatment plan with the patient. The patient was provided an opportunity to ask questions and all were answered. The patient agreed with the plan and demonstrated an understanding of the instructions.   The patient was advised to call back or seek an in-person evaluation if the symptoms worsen or if the condition fails to improve as anticipated.  Patient was given clear instructions to go to Emergency Department or return to medical center if symptoms don't improve, worsen, or new problems develop.The patient verbalized understanding.   I provided 15 minutes total of non-face-to-face time during this encounter including median intraservice time, reviewing previous notes, labs, imaging, medications, management and patient verbalized understanding.    Harris, NP  [facility] [place], [place]  09/01/2019, 9:10 AM

## 2019-09-01 NOTE — ED Provider Notes (Signed)
Transfer of Care Note I assumed care of [name] on 09/01/2019  Briefly, [name] is a 34 y.o. female who:  Presents with hyperglycemia/ketonuria  The plan includes:  F/u glucose after insulin/fluid    Please refer to the original provider's note for additional information regarding the care of [name].  ### Reassessment: I personally reassessed the patient:  Vital Signs:  The most current vitals were  Vitals:   09/01/19 1645 09/01/19 1700  BP: 120/80 (!) 113/91  Pulse: 77 76  Resp: 16 (!) 21  Temp:    SpO2: 100% 100%    Hemodynamics:  The patient is hemodynamically stable. Mental Status:  The patient is Alert  Additional MDM: Patient glucose normal on repeat. VSS. Okay for DC home. Return precautions given, she agreed and understood and plan.   The attending physician was present and available for all medical decision making and procedures related to this patient's care.    Kizzie Fantasia, MD 09/01/19 2041    Tegeler, Gwenyth Allegra, MD 09/01/19 [phone]

## 2019-09-01 NOTE — ED Provider Notes (Signed)
[facility] EMERGENCY DEPARTMENT Provider Note   CSN: [id number] Arrival date & time: 09/01/19  1223   History Chief Complaint  Patient presents with  . Abnormal Lab    [name] is a 34 y.o. female who presents with abnormal labs.  Patient states that she was sent by her doctor because she had ketones in her urine couple weeks ago.  She said her blood sugar has been running high.  Generally runs between 200-300.  She is on Lantus, Trulicity, Metformin.  She takes her Lantus inconsistently at nighttime because either she forgets her she does not want to mix with alcohol when she drinks.  She states that she has had some nausea without vomiting.  She gets left and right-sided abdominal pain but denies any currently.  She describes this as sometimes sharp sometimes throbbing.  She reports associated diarrhea for the past couple of days.  She denies fever, chills, chest pain, shortness of breath, cough, urinary symptoms.  Her last menstrual period was January 20.  HPI     Past Medical History:  Diagnosis Date  . Asthma   . Diabetes mellitus without complication ([place])   . 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 approx 3-4cm at time of 01/2016 c-section  . Herpes   . Trichomonas vaginitis     Patient Active Problem List   Diagnosis Date Noted  . Anxiety 08/27/2018  . DKA, type 2, not at goal [facility]) 08/27/2018  . DKA (diabetic ketoacidoses) ([place]) 08/27/2018  . New persistent daily headache 01/10/2018  . Uterine leiomyoma 01/10/2018  . Family history of diabetes mellitus in mother 01/10/2018  . Paresthesia of both feet 01/10/2018  . Visual field scotoma of both eyes 01/10/2018  . Other headache syndrome 01/10/2018  . Newly diagnosed diabetes ([place]) 01/10/2018  . S/P primary low transverse C-section 01/08/2016  . Asthma, mild intermittent 08/14/2015  . Sickle cell trait ([place]) 09/01/2014  . Morbid (severe) obesity due to excess calories ([place])  04/28/2012    Past Surgical History:  Procedure Laterality Date  . CESAREAN SECTION N/A 01/08/2016   Procedure: CESAREAN SECTION;  Surgeon: Aletha Halim, MD;  Location: [facility];  Service: Obstetrics;  Laterality: N/A;  . DILATION AND CURETTAGE OF UTERUS       OB History    Gravida  4   Para  3   Term  3   Preterm      AB  1   Living  3     SAB  1   TAB      Ectopic      Multiple  0   Live Births  3           Family History  Problem Relation Age of Onset  . Diabetes Mother   . Hypertension Mother   . Hyperlipidemia Mother   . Heart disease Mother   . Diabetes Sister   . Diabetes Brother     Social History   Tobacco Use  . Smoking status: Former Smoker    Types: Cigarettes  . Smokeless tobacco: Never Used  . Tobacco comment: quit 2013  Substance Use Topics  . Alcohol use: No    Comment: occasional, not with preg  . Drug use: No    Home Medications Prior to Admission medications   Medication Sig Start Date End Date Taking? Authorizing Provider  ACCU-CHEK GUIDE test strip USE AS DIRECTED TO CHECK BLOOD GLUCOSE TWICE DAILY 03/03/19  Forrest Moron, MD  albuterol (PROVENTIL HFA;VENTOLIN HFA) 108 (90 Base) MCG/ACT inhaler Inhale 2 puffs into the lungs every 6 (six) hours as needed for wheezing or shortness of breath. 11/03/18   Fulp, Cammie, MD  blood glucose meter kit and supplies KIT Dispense based on patient and insurance preference. Use up to four times daily as directed. (FOR ICD-10 E11.9). 01/17/18   Forrest Moron, MD  etonogestrel (NEXPLANON) 68 MG IMPL implant 1 each by Subdermal route once.    [provider]  ibuprofen (ADVIL,MOTRIN) 600 MG tablet Take 1 tablet (600 mg total) by mouth every 8 (eight) hours as needed for moderate pain. 08/29/18   Thurnell Lose, MD  insulin glargine (LANTUS) 100 UNIT/ML injection Inject 0.2 mLs (20 Units total) into the skin at bedtime. Can stop Lantus if fasting blood sugars are 80 or  less for 2 or more days 09/01/19   Camillia Herter, NP  Insulin Syringe-Needle U-100 25G X 1" 1 ML MISC For 4 times a day insulin SQ, 1 month supply. Diagnosis E11.65 06/28/19   Hall-Potvin, [place], PA-C  ketoconazole (NIZORAL) 2 % cream Apply 1 application topically 2 (two) times daily. 05/21/19   Drenda Freeze, MD  Lancet Devices (LANCING DEVICE) MISC Use to check blood glucose twice daily.  E 11.9 06/28/19   Hall-Potvin, [place], PA-C  metFORMIN (GLUCOPHAGE) 500 MG tablet Take 2 tablets (1,000 mg total) by mouth 2 (two) times daily with a meal. 06/28/19   Hall-Potvin, [place], PA-C  naproxen (NAPROSYN) 500 MG tablet Take 1 tablet (500 mg total) by mouth 2 (two) times daily with a meal. 08/18/19   Minette Brine, Amy J, NP  TRULICITY 3.15 QM/0.8QP SOPN INJECT 0.75 MG INTO THE SKIN ONCE A WEEK. ON WEDNESDAYS. 04/27/19   Fulp, Cammie, MD  valACYclovir (VALTREX) 500 MG tablet Take 1 tablet (500 mg total) by mouth 2 (two) times daily. One tablet twice per day for 3 days as needed for acute outbreaks 06/28/19   Hall-Potvin, [place], PA-C    Allergies    Patient has no known allergies.  Review of Systems   Review of Systems  Constitutional: Negative for chills and fever.  Respiratory: Negative for cough and shortness of breath.   Cardiovascular: Negative for chest pain.  Gastrointestinal: Positive for abdominal pain, diarrhea and nausea. Negative for vomiting.  Genitourinary: Negative for dysuria, frequency, pelvic pain and vaginal bleeding.  Allergic/Immunologic: Positive for immunocompromised state.    Physical Exam Updated Vital Signs BP 125/89 (BP Location: Right Arm)   Pulse 92   Temp 98.7 F (37.1 C) (Oral)   Resp 20   Ht '5\' 8"'$  (1.727 m)   Wt 108.9 kg   LMP 08/17/2019 (Exact Date)   SpO2 100%   BMI 36.49 kg/m   Physical Exam Vitals and nursing note reviewed.  Constitutional:      General: She is not in acute distress.    Appearance: She is well-developed. She is obese. She  is not ill-appearing.     Comments: Calm and cooperative. Pleasant, NAD  HENT:     Head: Normocephalic and atraumatic.  Eyes:     General: No scleral icterus.       Right eye: No discharge.        Left eye: No discharge.     Conjunctiva/sclera: Conjunctivae normal.     Pupils: Pupils are equal, round, and reactive to light.  Cardiovascular:     Rate and Rhythm: Normal rate and regular rhythm.  Pulmonary:     Effort: Pulmonary effort is normal. No respiratory distress.     Breath sounds: Normal breath sounds.  Abdominal:     General: There is no distension.     Palpations: Abdomen is soft.     Tenderness: There is no abdominal tenderness.  Musculoskeletal:     Cervical back: Normal range of motion.  Skin:    General: Skin is warm and dry.  Neurological:     Mental Status: She is alert and oriented to person, place, and time.  Psychiatric:        Behavior: Behavior normal.     ED Results / Procedures / Treatments   Labs (all labs ordered are listed, but only abnormal results are displayed) Labs Reviewed  COMPREHENSIVE METABOLIC PANEL - Abnormal; Notable for the following components:      Result Value   Sodium 130 (*)    Chloride 96 (*)    Glucose, Bld 471 (*)    BUN 5 (*)    AST 11 (*)    All other components within normal limits  URINALYSIS, ROUTINE W REFLEX MICROSCOPIC - Abnormal; Notable for the following components:   Color, Urine STRAW (*)    Glucose, UA >=500 (*)    Bacteria, UA RARE (*)    All other components within normal limits  CBC WITH DIFFERENTIAL/PLATELET  I-STAT BETA HCG BLOOD, ED ([facility], [facility], [facility] ONLY)    EKG None  Radiology No results found.  Procedures Procedures (including critical care time)  Medications Ordered in ED Medications  insulin aspart (novoLOG) injection 15 Units (has no administration in time range)  sodium chloride 0.9 % bolus 1,000 mL (1,000 mLs Intravenous New Bag/Given 09/01/19 1515)    ED Course  I have reviewed the  triage vital signs and the nursing notes.  Pertinent labs & imaging results that were available during my care of the patient were reviewed by me and considered in my medical decision making (see chart for details).  34 year old female with insulin-dependent diabetes presents to the ED at the request of her PCP to rule out DKA.  Her vitals are reassuring here.  She is well-appearing on exam.  Heart regular rate and rhythm.  Lungs are clear to auscultation.  Abdomen is soft and nontender.  Will obtain labs, UA  Blood work is remarkable for marked hyperglycemia (471) and likely pseudohyponatremia (130).  Anion gap is normal.  UA does not have any ketones.  Discussed the results with patient.  We will give her fluids and insulin and recheck afterwards.  Care signed out to Dr. Carroll Kinds at shift change. Anticipate d/c  MDM Rules/Calculators/A&P                       Final Clinical Impression(s) / ED Diagnoses Final diagnoses:  Hyperglycemia    Rx / DC Orders ED Discharge Orders    None       Recardo Evangelist, PA-C 09/01/19 1519    Noemi Chapel, MD 09/01/19 1932

## 2019-10-05 ENCOUNTER — Encounter: Payer: Self-pay | Admitting: Family Medicine

## 2019-10-13 ENCOUNTER — Other Ambulatory Visit: Payer: Self-pay | Admitting: Family Medicine

## 2019-10-13 DIAGNOSIS — Z975 Presence of (intrauterine) contraceptive device: Secondary | ICD-10-CM

## 2019-10-13 NOTE — Progress Notes (Signed)
Patient ID: [name], female   DOB: April 20, 1986, 34 y.o.   MRN: [id number]   Patient left my chart message requesting GYN referral for removal of implanted contraceptive device

## 2019-10-24 ENCOUNTER — Encounter: Payer: Medicaid Other | Attending: Family | Admitting: Registered"

## 2019-11-07 ENCOUNTER — Encounter: Payer: Self-pay | Admitting: Family Medicine

## 2019-11-09 ENCOUNTER — Other Ambulatory Visit: Payer: Self-pay

## 2019-11-09 ENCOUNTER — Ambulatory Visit: Payer: Self-pay | Attending: Family Medicine | Admitting: Physician Assistant

## 2019-11-09 VITALS — BP 125/82 | HR 93 | Temp 97.7°F | Ht 68.5 in | Wt 259.0 lb

## 2019-11-09 DIAGNOSIS — Z9114 Patient's other noncompliance with medication regimen: Secondary | ICD-10-CM

## 2019-11-09 DIAGNOSIS — F419 Anxiety disorder, unspecified: Secondary | ICD-10-CM

## 2019-11-09 DIAGNOSIS — A6000 Herpesviral infection of urogenital system, unspecified: Secondary | ICD-10-CM

## 2019-11-09 DIAGNOSIS — E1165 Type 2 diabetes mellitus with hyperglycemia: Secondary | ICD-10-CM

## 2019-11-09 LAB — GLUCOSE, POCT (MANUAL RESULT ENTRY)
POC Glucose: 343 mg/dl — AB (ref 70–99)
POC Glucose: 294 mg/dl — AB (ref 70–99)

## 2019-11-09 MED ORDER — "INSULIN SYRINGE-NEEDLE U-100 25G X 1"" 1 ML MISC": 0 refills | Status: DC

## 2019-11-09 MED ORDER — INSULIN GLARGINE 100 UNIT/ML ~~LOC~~ SOLN: 20.0000 [IU] | Freq: Every day | SUBCUTANEOUS | 5 refills | Status: DC

## 2019-11-09 MED ORDER — VALACYCLOVIR HCL 500 MG PO TABS: 500.0000 mg | ORAL_TABLET | Freq: Two times a day (BID) | ORAL | 1 refills | Status: DC

## 2019-11-09 MED ORDER — METFORMIN HCL 500 MG PO TABS: 1000.0000 mg | ORAL_TABLET | Freq: Two times a day (BID) | ORAL | 3 refills | Status: DC

## 2019-11-09 MED ORDER — INSULIN ASPART 100 UNIT/ML ~~LOC~~ SOLN
20.0000 [IU] | Freq: Once | SUBCUTANEOUS | Status: AC
  Administered 2019-11-09: 20 [IU] via SUBCUTANEOUS

## 2019-11-09 MED ORDER — TRULICITY 0.75 MG/0.5ML ~~LOC~~ SOAJ: 0.7500 mg | SUBCUTANEOUS | 2 refills | Status: DC

## 2019-11-09 NOTE — Progress Notes (Signed)
Patient ID: [name], female   DOB: [birth year], 34 y.o.   MRN: [id number]   [name], is a 34 y.o. female  [id number]  [id number]  DOB - [birth year]  Subjective:  Chief Complaint and HPI: [name] is a 34 y.o. female here today for med RF.  Admits to poor compliance with lantus and diet.  Says she only takes Lnatus about 20% of the time.  Takes her trulicity and metformin.  In a new relationship and partner is encouraging her to take better care of herself.  Has 3 young children ages 7,5,4.  Has been having to help them do school work bc of pandemic.  She is going through custody issues with their dad-he even made false allegations and called CPS on her with investigation that did not turn up any problems in her home.      ROS:   Constitutional:  No f/c, No night sweats, No unexplained weight loss. EENT:  No vision changes, No blurry vision, No hearing changes. No mouth, throat, or ear problems.  Respiratory: No cough, No SOB Cardiac: No CP, no palpitations GI:  No abd pain, No N/V/D. GU: No Urinary s/sx Musculoskeletal: No joint pain Neuro: some/occasional headache, no dizziness, no motor weakness.  Skin: No rash Endocrine:  No polydipsia. No polyuria.  Psych: Denies SI/HI  No problems updated.  ALLERGIES: No Known Allergies  PAST MEDICAL HISTORY: Past Medical History:  Diagnosis Date  . Asthma   . Diabetes mellitus without complication ([place])   . 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 approx 3-4cm at time of 01/2016 c-section  . Herpes   . Trichomonas vaginitis     MEDICATIONS AT HOME: Prior to Admission medications   Medication Sig Start Date End Date Taking? Authorizing Provider  ACCU-CHEK GUIDE test strip USE AS DIRECTED TO CHECK BLOOD GLUCOSE TWICE DAILY 03/03/19  Yes Stallings, Zoe A, MD  albuterol (PROVENTIL HFA;VENTOLIN HFA) 108 (90 Base) MCG/ACT inhaler Inhale 2 puffs into the lungs every 6 (six) hours as needed for  wheezing or shortness of breath. 11/03/18  Yes Fulp, Cammie, MD  blood glucose meter kit and supplies KIT Dispense based on patient and insurance preference. Use up to four times daily as directed. (FOR ICD-10 E11.9). 01/17/18  Yes Stallings, Zoe A, MD  Dulaglutide (TRULICITY) 4.69 GE/9.5MW SOPN Inject 0.75 mg into the skin once a week. On Wednesdays. 11/09/19  Yes Tamarius Rosenfield, Dionne Bucy, PA-C  etonogestrel (NEXPLANON) 68 MG IMPL implant 1 each by Subdermal route once.   Yes [provider]  insulin glargine (LANTUS) 100 UNIT/ML injection Inject 0.2 mLs (20 Units total) into the skin at bedtime. Can stop Lantus if fasting blood sugars are 80 or less for 2 or more days 11/09/19  Yes Yohanna Tow M, PA-C  Insulin Syringe-Needle U-100 25G X 1" 1 ML MISC For 4 times a day insulin SQ, 1 month supply. Diagnosis E11.65 11/09/19  Yes Argentina Donovan, PA-C  Lancet Devices (LANCING DEVICE) MISC Use to check blood glucose twice daily.  E 11.9 06/28/19  Yes Hall-Potvin, [place], PA-C  metFORMIN (GLUCOPHAGE) 500 MG tablet Take 2 tablets (1,000 mg total) by mouth 2 (two) times daily with a meal. 11/09/19  Yes Adrie Picking M, PA-C  naproxen (NAPROSYN) 500 MG tablet Take 1 tablet (500 mg total) by mouth 2 (two) times daily with a meal. 08/18/19  Yes Minette Brine, Amy J, NP  valACYclovir (VALTREX) 500 MG tablet Take 1 tablet (500 mg total)  by mouth 2 (two) times daily. One tablet twice per day for 3 days as needed for acute outbreaks 11/09/19   Argentina Donovan, PA-C     Objective:  EXAM:   Vitals:   11/09/19 1611  BP: 125/82  Pulse: 93  Temp: 97.7 F (36.5 C)  TempSrc: Temporal  SpO2: 100%  Weight: 259 lb (117.5 kg)  Height: 5' 8.5" (1.74 m)    General appearance : A&OX3. NAD. Non-toxic-appearing HEENT: Atraumatic and Normocephalic.  PERRLA. EOM intact.   Chest/Lungs:  Breathing-non-labored, Good air entry bilaterally, breath sounds normal without rales, rhonchi, or wheezing  CVS: S1 S2 regular, no  murmurs, gallops, rubs  Extremities: Bilateral Lower Ext shows no edema, both legs are warm to touch with = pulse throughout Neurology:  CN II-XII grossly intact, Non focal.   Psych:  TP linear. J/I WNL. Normal speech. Appropriate eye contact and affect.  Skin:  No Rash  Data Review Lab Results  Component Value Date   HGBA1C  08/18/2019     Comment:     >15   HGBA1C 11.2 (A) 12/29/2018   HGBA1C 12.5 (H) 08/28/2018     Assessment & Plan   1. Uncontrolled type 2 diabetes mellitus with hyperglycemia ([place]) Must take meds 100% of the time before adjusting doses.    heck blood sugars fasting and at bedtime and record and have ready for televisit in 3 weeks Drink 80-100 ounces water daily. Work to reduce your carbohydrate and sugar intake. - Glucose (CBG) - Hemoglobin A1c - insulin aspart (novoLOG) injection 20 Units - Glucose (CBG) - metFORMIN (GLUCOPHAGE) 500 MG tablet; Take 2 tablets (1,000 mg total) by mouth 2 (two) times daily with a meal.  Dispense: 120 tablet; Refill: 3 - insulin glargine (LANTUS) 100 UNIT/ML injection; Inject 0.2 mLs (20 Units total) into the skin at bedtime. Can stop Lantus if fasting blood sugars are 80 or less for 2 or more days  Dispense: 10 mL; Refill: 5 - Dulaglutide (TRULICITY) 2.53 GU/4.4IH SOPN; Inject 0.75 mg into the skin once a week. On Wednesdays.  Dispense: 2 mL; Refill: 2 - Comprehensive metabolic panel  2. Anxiety Counseled at length - Ambulatory referral to Social Work  3. Noncompliance with medications Compliance and self-care imperative to be around to take care of her kids!!  Her brother died from complications of diabetes  4. Genital herpes simplex, unspecified site - valACYclovir (VALTREX) 500 MG tablet; Take 1 tablet (500 mg total) by mouth 2 (two) times daily. One tablet twice per day for 3 days as needed for acute outbreaks  Dispense: 30 tablet; Refill: 1  Spent >40 mins face to face on all of the above.     Patient have been  counseled extensively about nutrition and exercise  Return in about 3 weeks (around 11/30/2019) for with [facility] for DM(phone visit fine to review numbers on all meds;  PCP in 2-3 months.  The patient was given clear instructions to go to ER or return to medical center if symptoms don't improve, worsen or new problems develop. The patient verbalized understanding. The patient was told to call to get lab results if they haven't heard anything in the next week.     Freeman Caldron, PA-C [facility] [place], [place]   11/09/2019, 4:49 PM

## 2019-11-09 NOTE — Patient Instructions (Signed)
Check blood sugars fasting and at bedtime and record and have ready for televisit in 3 weeks Drink 80-100 ounces water daily. Work to reduce your carbohydrate and sugar intake.   Stress, Adult Stress is a normal reaction to life events. Stress is what you feel when life demands more than you are used to, or more than you think you can handle. Some stress can be useful, such as studying for a test or meeting a deadline at work. Stress that occurs too often or for too long can cause problems. It can affect your emotional health and interfere with relationships and normal daily activities. Too much stress can weaken your body's defense system (immune system) and increase your risk for physical illness. If you already have a medical problem, stress can make it worse. What are the causes? All sorts of life events can cause stress. An event that causes stress for one person may not be stressful for another person. Major life events, whether positive or negative, commonly cause stress. Examples include:  Losing a job or starting a new job.  Losing a loved one.  Moving to a new town or home.  Getting married or divorced.  Having a baby.  Getting injured or sick. Less obvious life events can also cause stress, especially if they occur day after day or in combination with each other. Examples include:  Working long hours.  Driving in traffic.  Caring for children.  Being in debt.  Being in a difficult relationship. What are the signs or symptoms? Stress can cause emotional symptoms, including:  Anxiety. This is feeling worried, afraid, on edge, overwhelmed, or out of control.  Anger, including irritation or impatience.  Depression. This is feeling sad, down, helpless, or guilty.  Trouble focusing, remembering, or making decisions. Stress can cause physical symptoms, including:  Aches and pains. These may affect your head, neck, back, stomach, or other areas of your body.  Tight  muscles or a clenched jaw.  Low energy.  Trouble sleeping. Stress can cause unhealthy behaviors, including:  Eating to feel better (overeating) or skipping meals.  Working too much or putting off tasks.  Smoking, drinking alcohol, or using drugs to feel better. How is this diagnosed? Stress is diagnosed through an assessment by your health care provider. He or she may diagnose this condition based on:  Your symptoms and any stressful life events.  Your medical history.  Tests to rule out other causes of your symptoms. Depending on your condition, your health care provider may refer you to a specialist for further evaluation. How is this treated?  Stress management techniques are the recommended treatment for stress. Medicine is not typically recommended for the treatment of stress. Techniques to reduce your reaction to stressful life events include:  Stress identification. Monitor yourself for symptoms of stress and identify what causes stress for you. These skills may help you to avoid or prepare for stressful events.  Time management. Set your priorities, keep a calendar of events, and learn to say no. Taking these actions can help you avoid making too many commitments. Techniques for coping with stress include:  Rethinking the problem. Try to think realistically about stressful events rather than ignoring them or overreacting. Try to find the positives in a stressful situation rather than focusing on the negatives.  Exercise. Physical exercise can release both physical and emotional tension. The key is to find a form of exercise that you enjoy and do it regularly.  Relaxation techniques. These relax the  body and mind. The key is to find one or more that you enjoy and use the techniques regularly. Examples include: ? Meditation, deep breathing, or progressive relaxation techniques. ? Yoga or tai chi. ? Biofeedback, mindfulness techniques, or journaling. ? Listening to music,  being out in nature, or participating in other hobbies.  Practicing a healthy lifestyle. Eat a balanced diet, drink plenty of water, limit or avoid caffeine, and get plenty of sleep.  Having a strong support network. Spend time with family, friends, or other people you enjoy being around. Express your feelings and talk things over with someone you trust. Counseling or talk therapy with a mental health professional may be helpful if you are having trouble managing stress on your own. Follow these instructions at home: Lifestyle   Avoid drugs.  Do not use any products that contain nicotine or tobacco, such as cigarettes, e-cigarettes, and chewing tobacco. If you need help quitting, ask your health care provider.  Limit alcohol intake to no more than 1 drink a day for nonpregnant women and 2 drinks a day for men. One drink equals 12 oz of beer, 5 oz of wine, or 1 oz of hard liquor  Do not use alcohol or drugs to relax.  Eat a balanced diet that includes fresh fruits and vegetables, whole grains, lean meats, fish, eggs, and beans, and low-fat dairy. Avoid processed foods and foods high in added fat, sugar, and salt.  Exercise at least 30 minutes on 5 or more days each week.  Get 7-8 hours of sleep each night. General instructions   Practice stress management techniques as discussed with your health care provider.  Drink enough fluid to keep your urine clear or pale yellow.  Take over-the-counter and prescription medicines only as told by your health care provider.  Keep all follow-up visits as told by your health care provider. This is important. Contact a health care provider if:  Your symptoms get worse.  You have new symptoms.  You feel overwhelmed by your problems and can no longer manage them on your own. Get help right away if:  You have thoughts of hurting yourself or others. If you ever feel like you may hurt yourself or others, or have thoughts about taking your own  life, get help right away. You can go to your nearest emergency department or call:  Your local emergency services (911 in the [place]  A suicide crisis helpline, such as the [place] at 475-874-9973. This is open 24 hours a day. Summary  Stress is a normal reaction to life events. It can cause problems if it happens too often or for too long.  Practicing stress management techniques is the best way to treat stress.  Counseling or talk therapy with a mental health professional may be helpful if you are having trouble managing stress on your own. This information is not intended to replace advice given to you by your health care provider. Make sure you discuss any questions you have with your health care provider. Document Revised: 02/17/2019 Document Reviewed: 09/09/2016 Elsevier Patient Education  [place].

## 2019-11-10 LAB — COMPREHENSIVE METABOLIC PANEL
ALT: 8 IU/L (ref 0–32)
AST: 12 IU/L (ref 0–40)
Albumin/Globulin Ratio: 1.8 (ref 1.2–2.2)
Albumin: 4.5 g/dL (ref 3.8–4.8)
Alkaline Phosphatase: 100 IU/L (ref 39–117)
BUN/Creatinine Ratio: 10 (ref 9–23)
BUN: 8 mg/dL (ref 6–20)
Bilirubin Total: <0.2 mg/dL (ref 0.0–1.2)
CO2: 23 mmol/L (ref 20–29)
Calcium: 9.4 mg/dL (ref 8.7–10.2)
Chloride: 97 mmol/L (ref 96–106)
Creatinine, Ser: 0.82 mg/dL (ref 0.57–1.00)
GFR calc Af Amer: 109 mL/min/{1.73_m2} (ref >59)
GFR calc non Af Amer: 95 mL/min/{1.73_m2} (ref >59)
Globulin, Total: 2.5 g/dL (ref 1.5–4.5)
Glucose: 311 mg/dL — ABNORMAL HIGH (ref 65–99)
Potassium: 4.3 mmol/L (ref 3.5–5.2)
Sodium: 134 mmol/L (ref 134–144)
Total Protein: 7 g/dL (ref 6.0–8.5)

## 2019-11-10 LAB — HEMOGLOBIN A1C
Est. average glucose Bld gHb Est-mCnc: 272 mg/dL
Hgb A1c MFr Bld: 11.1 % — ABNORMAL HIGH (ref 4.8–5.6)

## 2019-11-16 ENCOUNTER — Ambulatory Visit: Payer: Medicaid Other | Admitting: Family Medicine

## 2019-11-17 ENCOUNTER — Encounter: Payer: Self-pay | Admitting: Physician Assistant

## 2019-11-22 ENCOUNTER — Other Ambulatory Visit: Payer: Self-pay

## 2019-11-22 ENCOUNTER — Encounter: Payer: Self-pay | Admitting: Obstetrics & Gynecology

## 2019-11-22 ENCOUNTER — Other Ambulatory Visit (HOSPITAL_COMMUNITY)
Admission: RE | Admit: 2019-11-22 | Discharge: 2019-11-22 | Disposition: A | Discharge status: Home / Self Care | Payer: Medicaid Other | Source: Ambulatory Visit | Attending: Obstetrics & Gynecology | Admitting: Obstetrics & Gynecology

## 2019-11-22 ENCOUNTER — Telehealth: Payer: Self-pay | Admitting: Licensed Clinical Social Worker

## 2019-11-22 ENCOUNTER — Ambulatory Visit (INDEPENDENT_AMBULATORY_CARE_PROVIDER_SITE_OTHER): Payer: Medicaid Other | Admitting: Obstetrics & Gynecology

## 2019-11-22 VITALS — BP 127/77 | HR 86 | Ht 68.0 in | Wt 261.0 lb

## 2019-11-22 DIAGNOSIS — Z3046 Encounter for surveillance of implantable subdermal contraceptive: Secondary | ICD-10-CM

## 2019-11-22 DIAGNOSIS — Z01419 Encounter for gynecological examination (general) (routine) without abnormal findings: Secondary | ICD-10-CM | POA: Diagnosis present

## 2019-11-22 NOTE — Progress Notes (Signed)
GYNECOLOGY ANNUAL PREVENTATIVE CARE ENCOUNTER NOTE  History:     [name] is a 34 y.o. [phone] female here for a routine annual gynecologic exam.  Current complaints: none.  Desires Nexplanon removal today, has been in place for 4 years.  Wants to try to get pregnant again.  Desires pap and STI screen also today.   Denies abnormal vaginal bleeding, discharge, pelvic pain, problems with intercourse or other gynecologic concerns.    Gynecologic History Patient's last menstrual period was 11/17/2019 (exact date). Contraception: Nexplanon Last Pap: 2016. Results were: normal  Obstetric History OB History  Gravida Para Term Preterm AB Living  _0 SAB TAB Ectopic Multiple Live Births  1     0 3    # Outcome Date GA Lbr Len/2nd Weight Sex Delivery Anes PTL Lv  4 Term 01/08/16 [redacted]w[redacted]d 6 lb 11.8 oz (3.055 kg) F CS-LTranv EPI  LIV  3 Term 03/05/15 [id number] / 00:04 6 lb 4.9 oz (2.86 kg) F Vag-Spont EPI, Local  LIV  2 Term 11/02/12 [id number] lb 5 oz (3.317 kg) F Vag-Spont EPI N LIV  1 SAB 2014            Past Medical History:  Diagnosis Date  . Asthma   . [place] mellitus [place] ([place] 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 approx 3-4cm at time of 01/2016 c-section  . Herpes   . Trichomonas vaginitis     Past Surgical History:  Procedure Laterality Date  . CESAREAN SECTION N/A 01/08/2016   Procedure: CESAREAN SECTION;  Surgeon: ChaAletha HalimD;  [facility];  Laterality: N/A;  . DILATION AND CURETTAGE OF UTERUS      Current Outpatient Medications on File Prior to Visit  Medication Sig Dispense Refill  . albuterol (PROVENTIL HFA;VENTOLIN HFA) 108 (90 Base) MCG/ACT inhaler Inhale 2 puffs into the lungs every 6 (six) hours as needed for wheezing or shortness of breath. 1 Inhaler 2  . blood glucose meter kit and supplies KIT Dispense based on patient and insurance preference. Use up to four  times daily as directed. (FOR ICD-10 E11.9). 1 each 0  . Dulaglutide (TRULICITY) 0.74.33/IR/5.1OAPN Inject 0.75 mg into the skin once a week. On Wednesdays. 2 mL 2  . FLUoxetine (PROZAC) 10 MG tablet Take 10 mg by mouth daily.    . insulin glargine (LANTUS) 100 UNIT/ML injection Inject 0.2 mLs (20 Units total) into the skin at bedtime. Can stop Lantus if fasting blood sugars are 80 or less for 2 or more days 10 mL 5  . Insulin Syringe-Needle U-100 25G X 1" 1 ML MISC For 4 times a day insulin SQ, 1 month supply. Diagnosis E11.65 30 each 0  . Lancet Devices (LANCING DEVICE) MISC Use to check blood glucose twice daily.  E 11.9 1 each 0  . metFORMIN (GLUCOPHAGE) 500 MG tablet Take 2 tablets (1,000 mg total) by mouth 2 (two) times daily with a meal. 120 tablet 3  . valACYclovir (VALTREX) 500 MG tablet Take 1 tablet (500 mg total) by mouth 2 (two) times daily. One tablet twice per day for 3 days as needed for acute outbreaks 30 tablet 1   No current facility-administered medications on file prior to visit.    No Known Allergies  Social History:  reports that she has been smoking cigarettes. She has never used  smokeless tobacco. She reports that she does not drink alcohol or use drugs.  Family History  Problem Relation Age of Onset  . Diabetes Mother   . Hypertension Mother   . Hyperlipidemia Mother   . Heart disease Mother   . Breast cancer Mother        57  . Ovarian cancer Mother        Unknown age  . Diabetes Sister   . Diabetes Brother   . Breast cancer Cousin     The following portions of the patient's history were reviewed and updated as appropriate: allergies, current medications, past family history, past medical history, past social history, past surgical history and problem list.  Review of Systems Pertinent items noted in HPI and remainder of comprehensive ROS otherwise negative.  Physical Exam:  BP 127/77   Pulse 86   Ht _0  (1.727 m)   Wt 261 lb (118.4 kg)   LMP  11/17/2019 (Exact Date)   Breastfeeding No   BMI 39.68 kg/m  CONSTITUTIONAL: Well-developed, well-nourished female in no acute distress.  HENT:  Normocephalic, atraumatic, External right and left ear normal. Oropharynx is clear and moist EYES: Conjunctivae and EOM are normal. Pupils are equal, round, and reactive to light. No scleral icterus.  NECK: Normal range of motion, supple, no masses.  Normal thyroid.  SKIN: Skin is warm and dry. No rash noted. Not diaphoretic. No erythema. No pallor. MUSCULOSKELETAL: Normal range of motion. No tenderness.  No cyanosis, clubbing, or edema.  2+ distal pulses. Nexplanon palpated in left arm. NEUROLOGIC: Alert and oriented to person, place, and time. Normal reflexes, muscle tone coordination.  PSYCHIATRIC: Normal mood and affect. Normal behavior. Normal judgment and thought content. CARDIOVASCULAR: Normal heart rate noted, regular rhythm RESPIRATORY: Clear to auscultation bilaterally. Effort and breath sounds normal, no problems with respiration noted. BREASTS: Symmetric in size. No masses, tenderness, skin changes, nipple drainage, or lymphadenopathy bilaterally. Performed in the presence of a chaperone. ABDOMEN: Soft, obese, no distention noted.  No tenderness, rebound or guarding.  PELVIC: Normal appearing external genitalia and urethral meatus; normal appearing vaginal mucosa and cervix.  No abnormal discharge noted.  Pap smear obtained.  Unable to palpate uterus and adnexa due to habitus but no uterine or adnexal tenderness.  Performed in the presence of a chaperone.  Nexplanon Removal Procedure Patient identified, informed consent performed, consent signed.   Appropriate time out taken. Nexplanon site identified.  Area prepped in usual sterile fashon. One ml of 1% lidocaine was used to anesthetize the area at the distal end of the implant. A small stab incision was made right beside the implant on the distal portion.  The Nexplanon rod was grasped using  hemostats and removed without difficulty.  There was minimal blood loss. There were no complications.  2 ml of 1% lidocaine was injected around the incision for post-procedure analgesia.  Steri-strips were applied over the small incision.  A pressure bandage was applied to reduce any bruising.  The patient tolerated the procedure well and was given post procedure instructions.  Patient is planning to attempt conception.   Assessment and Plan:    1. Encounter for Nexplanon removal Nexplanon removed.  She is planning for conception.  Will start taking multivitamins with folic acid supplement, avoid teratogens, use ovulation tracker apps as needed. However, if they are having troubles with conception after about one year, further management would be recommended. Optimization of other health issues recommended.   2. Well woman exam with  routine gynecological exam - Cytology - PAP - Hepatitis B surface antigen - Hepatitis C antibody - HIV Antibody (routine testing w rflx) - RPR Will follow up results of pap smear and manage accordingly. Discussed genetic testing for breast/ovarian cancer genes given maternal history of breast and ovarian cancer, she will look into this. Routine preventative health maintenance measures emphasized, recommended monthly self breast checks and routine provider checks. Please refer to After Visit Summary for other counseling recommendations.      Verita Schneiders, MD, [place] [facility] [employer], [place]

## 2019-11-22 NOTE — Telephone Encounter (Signed)
Call placed to patient regarding [facility] referral. LCSW left message requesting a return call.  

## 2019-11-22 NOTE — Patient Instructions (Signed)
Nexplanon Instructions After Removal   Keep bandage clean and dry for 24 hours   May use ice/Tylenol/Ibuprofen for soreness or pain   If you develop fever, drainage or increased warmth from incision site-contact office immediately    Preventive Care 45-33 Years Old, Female Preventive care refers to visits with your health care provider and lifestyle choices that can promote health and wellness. This includes:  A yearly physical exam. This may also be called an annual well check.  Regular dental visits and eye exams.  Immunizations.  Screening for certain conditions.  Healthy lifestyle choices, such as eating a healthy diet, getting regular exercise, not using drugs or products that contain nicotine and tobacco, and limiting alcohol use. What can I expect for my preventive care visit? Physical exam Your health care provider will check your:  Height and weight. This may be used to calculate body mass index (BMI), which tells if you are at a healthy weight.  Heart rate and blood pressure.  Skin for abnormal spots. Counseling Your health care provider may ask you questions about your:  Alcohol, tobacco, and drug use.  Emotional well-being.  Home and relationship well-being.  Sexual activity.  Eating habits.  Work and work Statistician.  Method of birth control.  Menstrual cycle.  Pregnancy history. What immunizations do I need?  Influenza (flu) vaccine  This is recommended every year. Tetanus, diphtheria, and pertussis (Tdap) vaccine  You may need a Td booster every 10 years. Varicella (chickenpox) vaccine  You may need this if you have not been vaccinated. Human papillomavirus (HPV) vaccine  If recommended by your health care provider, you may need three doses over 6 months. Measles, mumps, and rubella (MMR) vaccine  You may need at least one dose of MMR. You may also need a second dose. Meningococcal conjugate (MenACWY) vaccine  One dose is  recommended if you are age 83-21 years and a first-year college student living in a residence hall, or if you have one of several medical conditions. You may also need additional booster doses. Pneumococcal conjugate (PCV13) vaccine  You may need this if you have certain conditions and were not previously vaccinated. Pneumococcal polysaccharide (PPSV23) vaccine  You may need one or two doses if you smoke cigarettes or if you have certain conditions. Hepatitis A vaccine  You may need this if you have certain conditions or if you travel or work in places where you may be exposed to hepatitis A. Hepatitis B vaccine  You may need this if you have certain conditions or if you travel or work in places where you may be exposed to hepatitis B. Haemophilus influenzae type b (Hib) vaccine  You may need this if you have certain conditions. You may receive vaccines as individual doses or as more than one vaccine together in one shot (combination vaccines). Talk with your health care provider about the risks and benefits of combination vaccines. What tests do I need?  Blood tests  Lipid and cholesterol levels. These may be checked every 5 years starting at age 43.  Hepatitis C test.  Hepatitis B test. Screening  Diabetes screening. This is done by checking your blood sugar (glucose) after you have not eaten for a while (fasting).  Sexually transmitted disease (STD) testing.  BRCA-related cancer screening. This may be done if you have a family history of breast, ovarian, tubal, or peritoneal cancers.  Pelvic exam and Pap test. This may be done every 3 years starting at age 57. Starting at  age 11, this may be done every 5 years if you have a Pap test in combination with an HPV test. Talk with your health care provider about your test results, treatment options, and if necessary, the need for more tests. Follow these instructions at home: Eating and drinking   Eat a diet that includes fresh  fruits and vegetables, whole grains, lean protein, and low-fat dairy.  Take vitamin and mineral supplements as recommended by your health care provider.  Do not drink alcohol if: ? Your health care provider tells you not to drink. ? You are pregnant, may be pregnant, or are planning to become pregnant.  If you drink alcohol: ? Limit how much you have to 0-1 drink a day. ? Be aware of how much alcohol is in your drink. In the [place] one drink equals one 12 oz bottle of beer (355 mL), one 5 oz glass of wine (148 mL), or one 1 oz glass of hard liquor (44 mL). Lifestyle  Take daily care of your teeth and gums.  Stay active. Exercise for at least 30 minutes on 5 or more days each week.  Do not use any products that contain nicotine or tobacco, such as cigarettes, e-cigarettes, and chewing tobacco. If you need help quitting, ask your health care provider.  If you are sexually active, practice safe sex. Use a condom or other form of birth control (contraception) in order to prevent pregnancy and STIs (sexually transmitted infections). If you plan to become pregnant, see your health care provider for a preconception visit. What's next?  Visit your health care provider once a year for a well check visit.  Ask your health care provider how often you should have your eyes and teeth checked.  Stay up to date on all vaccines. This information is not intended to replace advice given to you by your health care provider. Make sure you discuss any questions you have with your health care provider. Document Revised: 03/31/2018 Document Reviewed: 03/31/2018 Elsevier Patient Education  2020 [employer].

## 2019-11-23 ENCOUNTER — Ambulatory Visit: Payer: Medicaid Other | Admitting: Pharmacist

## 2019-11-23 LAB — RPR: RPR Ser Ql: NONREACTIVE

## 2019-11-23 LAB — HEPATITIS B SURFACE ANTIGEN: Hepatitis B Surface Ag: NEGATIVE

## 2019-11-23 LAB — CYTOLOGY - PAP
Chlamydia: NEGATIVE
Comment: NEGATIVE
Comment: NEGATIVE
Comment: NEGATIVE
Comment: NORMAL
Diagnosis: NEGATIVE
High risk HPV: NEGATIVE
Neisseria Gonorrhea: NEGATIVE
Trichomonas: NEGATIVE

## 2019-11-23 LAB — HEPATITIS C ANTIBODY: Hep C Virus Ab: <0.1 s/co ratio (ref 0.0–0.9)

## 2019-11-23 LAB — HIV ANTIBODY (ROUTINE TESTING W REFLEX): HIV Screen 4th Generation wRfx: NONREACTIVE

## 2019-11-29 ENCOUNTER — Other Ambulatory Visit: Payer: Self-pay

## 2019-11-29 ENCOUNTER — Ambulatory Visit: Payer: Medicaid Other | Attending: Family Medicine | Admitting: Licensed Clinical Social Worker

## 2019-11-29 ENCOUNTER — Ambulatory Visit: Payer: Self-pay | Attending: Family Medicine | Admitting: Pharmacist

## 2019-11-29 ENCOUNTER — Encounter: Payer: Self-pay | Admitting: Pharmacist

## 2019-11-29 DIAGNOSIS — F439 Reaction to severe stress, unspecified: Secondary | ICD-10-CM

## 2019-11-29 DIAGNOSIS — Z794 Long term (current) use of insulin: Secondary | ICD-10-CM | POA: Insufficient documentation

## 2019-11-29 DIAGNOSIS — E119 Type 2 diabetes mellitus without complications: Secondary | ICD-10-CM | POA: Insufficient documentation

## 2019-11-29 DIAGNOSIS — E1165 Type 2 diabetes mellitus with hyperglycemia: Secondary | ICD-10-CM

## 2019-11-29 MED ORDER — TRULICITY 1.5 MG/0.5ML ~~LOC~~ SOAJ: 1.5000 mg | SUBCUTANEOUS | 2 refills | Status: DC

## 2019-11-29 NOTE — Progress Notes (Signed)
    S:    PCP: Dr. Chapman Fitch   No chief complaint on file.  Patient arrives today in good spirits. Presents for diabetes evaluation, education, and management at the request of Dr. Chapman Fitch. Patient was referred on 11/09/2019. At that appointment, blood sugar was elevated and pt admitted to poor medication compliance.   Today, patient reports adherence with medications. She reports being encouraged by her new partner. She has incorporated drastic changes in her diet and now takes her medications daily.    Current diabetes medications include:  - Metformin 500 mg XR (takes 2 tabs after whatever meal she eats) - Lantus 20 units - Trulicity [id number] mg weekly  Patient denies hypoglycemic events.  Patient reported dietary habits:  - Has switched to non-starchy vegetables and reports eliminating sugars  Patient-reported exercise habits:  - Limited outside of childcare   Patient denies polyuria, polydipsia. Patient  denies neuropathy. Patient denies visual changes.  O:  POCT: 110  Home CBGs: 180s - 220s   Lab Results  Component Value Date   HGBA1C 11.1 (H) 11/09/2019   There were no vitals filed for this visit.  Lipid Panel  No results found for: CHOL, TRIG, HDL, CHOLHDL, VLDL, LDLCALC, LDLDIRECT  Clinical ASCVD: No  The ASCVD Risk score Mikey Bussing DC Jr., et al., 2013) failed to calculate for the following reasons:   The 2013 ASCVD risk score is only valid for ages 3 to 47   A/P: Diabetes longstanding currently uncontrolled but sugars have improved tremendously. Today's clinic CBG looks very good compared to earlier this month. Patient is able to verbalize appropriate hypoglycemia management plan. Patient is adherent with medication. I recommend to increase Trulicity to 1.5 mg weekly to get better control of home CBGs.   -Continue Lantus and metformin. -Increase Trulicity to 1.5 mg weekly. -Extensively discussed pathophysiology of DM, recommended lifestyle interventions, dietary effects  on glycemic control -Counseled on s/sx of and management of hypoglycemia -Next A1C anticipated 02/2020.    Benard Halsted, PharmD, [place] [phone]

## 2019-12-20 ENCOUNTER — Ambulatory Visit: Payer: Medicaid Other | Admitting: Pharmacist

## 2019-12-27 NOTE — BH Specialist Note (Signed)
Integrated Behavioral Health Initial Visit  MRN: [id number] Name: ASHAWNTI KLAUCK  Number of [place] Clinician visits:: 1/6 Session Start time: 4:25 PM  Session End time: 4:40 PM Total time: 15  Type of Service: [place] Interpretor:No. Interpretor Name and Language: NA   SUBJECTIVE: [name] is a 34 y.o. female accompanied by self Patient was referred by Weyman Pedro for anxiety. Patient reports the following symptoms/concerns: Pt reports management of ongoing psychosocial stressors Duration of problem: Ongoing; Severity of problem: mild  OBJECTIVE: Mood: Pleasant and Affect: Appropriate Risk of harm to self or others: No plan to harm self or others  LIFE CONTEXT: Family and Social: Pt cares for three minor children. She receives strong support from partner School/Work: Pt is employed full time Self-Care: Pt is improving compliance with medications to manage diabetes Life Changes: Pt reports ongoing psychosocial stressors  GOALS ADDRESSED: Patient will: 1. Reduce symptoms of: anxiety and stress 2. Increase knowledge and/or ability of: coping skills and healthy habits  3. Demonstrate ability to: Increase healthy adjustment to current life circumstances, Increase adequate support systems for patient/family and Improve medication compliance  INTERVENTIONS: Interventions utilized: Solution-Focused Strategies, Supportive Counseling and Psychoeducation and/or Health Education  Standardized Assessments completed: Not Needed  ASSESSMENT: Patient currently experiencing ongoing psychosocial stressors.   Patient is currently receiving services and may benefit from continued therapy. LCSW discussed correlation between one's physical and mental health, in addition, to how stress can negatively impact both. Healthy coping skills discussed.  PLAN: 1. Follow up with behavioral health clinician on : Contact LCSW with any  additional behavioral health and/or resource needs 2. Behavioral recommendations: Continue to participate in services and utilize healthy strategies discussed 3. Referral(s): [place] (In Clinic) 4. "From scale of 1-10, how likely are you to follow plan?":   Rebekah Chesterfield, LCSW 12/27/2019 9:26 AM

## 2019-12-29 ENCOUNTER — Ambulatory Visit: Payer: Medicaid Other | Admitting: Pharmacist

## 2020-01-10 ENCOUNTER — Ambulatory Visit: Payer: Self-pay | Attending: Family Medicine | Admitting: Pharmacist

## 2020-01-10 ENCOUNTER — Encounter: Payer: Self-pay | Admitting: Pharmacist

## 2020-01-10 ENCOUNTER — Ambulatory Visit: Payer: Medicaid Other | Admitting: Family Medicine

## 2020-01-10 ENCOUNTER — Other Ambulatory Visit: Payer: Self-pay

## 2020-01-10 DIAGNOSIS — E1165 Type 2 diabetes mellitus with hyperglycemia: Secondary | ICD-10-CM

## 2020-01-10 NOTE — Progress Notes (Signed)
    S:    PCP: Dr. Chapman Fitch   No chief complaint on file.  Patient arrives today in good spirits. Presents for diabetes evaluation, education, and management at the request of Dr. Chapman Fitch. Patient was referred on 11/09/2019. At that appointment, blood sugar was elevated and pt admitted to poor medication compliance.   Today, patient reports recent family events that have her feeling down. She reports having a slip in her diet and only recently started her higher dose of Trulicity (~2 weeks ago). She has struggled taking her Lantus and metformin daily as prescribed since last seeing me.   Current diabetes medications include:  - Metformin  1000 mg BID - Lantus 20 units - Trulicity 1.5 mg weekly  Patient denies hypoglycemic events.  Patient reported dietary habits:  - Has switched to non-starchy vegetables and reports eliminating sugars.  - Admits that she recently has slipped in regards to her diet d/t stress at home. She is trying to get back on track.   Patient-reported exercise habits:  - Limited outside of childcare   Patient denies polyuria, polydipsia. Patient denies neuropathy. Patient denies visual changes.  O:  POCT: 334  Home CBGs: Reports 120-150s when fully compliant. However, more recent sugars have been "high". Pt does not have her meter with her.   Lab Results  Component Value Date   HGBA1C 11.1 (H) 11/09/2019   There were no vitals filed for this visit.  Lipid Panel  No results found for: CHOL, TRIG, HDL, CHOLHDL, VLDL, LDLCALC, LDLDIRECT  Clinical ASCVD: No  The ASCVD Risk score Mikey Bussing DC Jr., et al., 2013) failed to calculate for the following reasons:   The 2013 ASCVD risk score is only valid for ages 38 to 59   A/P: Diabetes longstanding currently uncontrolled. Patient is able to verbalize appropriate hypoglycemia management plan. Patient is not adherent with medication due to some increased stress at home. Will hold off on any changes. I encouraged her to  take medications as prescribed and to follow a good diabetic diet.  -Continue current regimen. Compliance reinforced.  -Extensively discussed pathophysiology of DM, recommended lifestyle interventions, dietary effects on glycemic control -Counseled on s/sx of and management of hypoglycemia -Next A1C anticipated 02/2020.   Benard Halsted, PharmD, [place] [phone]

## 2020-01-16 ENCOUNTER — Emergency Department (HOSPITAL_COMMUNITY)
Admission: EM | Admit: 2020-01-16 | Discharge: 2020-01-17 | Discharge status: Against Medical Advice | Payer: Medicaid Other | Attending: Emergency Medicine | Admitting: Emergency Medicine

## 2020-01-16 ENCOUNTER — Encounter (HOSPITAL_COMMUNITY): Payer: Self-pay | Admitting: Emergency Medicine

## 2020-01-16 ENCOUNTER — Other Ambulatory Visit: Payer: Self-pay

## 2020-01-16 DIAGNOSIS — Z5321 Procedure and treatment not carried out due to patient leaving prior to being seen by health care provider: Secondary | ICD-10-CM | POA: Diagnosis not present

## 2020-01-16 DIAGNOSIS — Z7984 Long term (current) use of oral hypoglycemic drugs: Secondary | ICD-10-CM | POA: Insufficient documentation

## 2020-01-16 DIAGNOSIS — E1165 Type 2 diabetes mellitus with hyperglycemia: Secondary | ICD-10-CM | POA: Diagnosis present

## 2020-01-16 LAB — URINALYSIS, ROUTINE W REFLEX MICROSCOPIC
Bacteria, UA: NONE SEEN
Bilirubin Urine: NEGATIVE
Glucose, UA: >=500 mg/dL — AB
Hgb urine dipstick: NEGATIVE
Ketones, ur: NEGATIVE mg/dL
Leukocytes,Ua: NEGATIVE
Nitrite: NEGATIVE
Protein, ur: NEGATIVE mg/dL
Specific Gravity, Urine: 1.02 (ref 1.005–1.030)
pH: 6 (ref 5.0–8.0)

## 2020-01-16 LAB — BASIC METABOLIC PANEL
Anion gap: 12 (ref 5–15)
BUN: 11 mg/dL (ref 6–20)
CO2: 22 mmol/L (ref 22–32)
Calcium: 9.7 mg/dL (ref 8.9–10.3)
Chloride: 92 mmol/L — ABNORMAL LOW (ref 98–111)
Creatinine, Ser: 1.01 mg/dL — ABNORMAL HIGH (ref 0.44–1.00)
GFR calc Af Amer: >60 mL/min (ref >60)
GFR calc non Af Amer: >60 mL/min (ref >60)
Glucose, Bld: 784 mg/dL (ref 70–99)
Potassium: 4.9 mmol/L (ref 3.5–5.1)
Sodium: 126 mmol/L — ABNORMAL LOW (ref 135–145)

## 2020-01-16 LAB — CBC
HCT: 38.6 % (ref 36.0–46.0)
Hemoglobin: 12.8 g/dL (ref 12.0–15.0)
MCH: 26.4 pg (ref 26.0–34.0)
MCHC: 33.2 g/dL (ref 30.0–36.0)
MCV: 79.6 fL — ABNORMAL LOW (ref 80.0–100.0)
Platelets: 331 10*3/uL (ref 150–400)
RBC: 4.85 MIL/uL (ref 3.87–5.11)
RDW: 13.4 % (ref 11.5–15.5)
WBC: 7.9 10*3/uL (ref 4.0–10.5)
nRBC: 0 % (ref 0.0–0.2)

## 2020-01-16 LAB — I-STAT BETA HCG BLOOD, ED (MC, WL, AP ONLY): I-stat hCG, quantitative: 314.1 m[IU]/mL — ABNORMAL HIGH (ref <5)

## 2020-01-16 LAB — CBG MONITORING, ED
Glucose-Capillary: >600 mg/dL (ref 70–99)
Glucose-Capillary: 577 mg/dL (ref 70–99)

## 2020-01-16 NOTE — ED Triage Notes (Signed)
Pt c/o hyperglycemia, reports she hasn't been taking her metformin because it makes her feel nauseous. CBG in triage "hi"

## 2020-01-16 NOTE — ED Notes (Signed)
Pt stated they were leaving  

## 2020-01-23 ENCOUNTER — Ambulatory Visit (INDEPENDENT_AMBULATORY_CARE_PROVIDER_SITE_OTHER): Payer: Self-pay

## 2020-01-23 ENCOUNTER — Encounter: Payer: Self-pay | Admitting: Family Medicine

## 2020-01-23 ENCOUNTER — Other Ambulatory Visit: Payer: Self-pay

## 2020-01-23 DIAGNOSIS — Z3687 Encounter for antenatal screening for uncertain dates: Secondary | ICD-10-CM

## 2020-01-23 DIAGNOSIS — Z3201 Encounter for pregnancy test, result positive: Secondary | ICD-10-CM

## 2020-01-23 LAB — POCT PREGNANCY, URINE: Preg Test, Ur: POSITIVE — AB

## 2020-01-23 NOTE — Progress Notes (Signed)
Pt left urine sample for pregnancy test.  Resulted positive.  Pt reports that she is unsure of LMP.  Pt states that she has irregular periods and that she thinks her last period was sometime in April .  Pt denies vaginal bleeding and pain.  Medications/allergies.  OB US scheduled for June 24th @ 11am due to pt being unsure of LMP.  Pt informed that she will be able to obtain proof of pregnancy letter via [facility].  Pt verbalized understanding with no further questions.   Mel Almond, RN  01/23/20

## 2020-01-23 NOTE — Progress Notes (Signed)
Agree with A & P. 

## 2020-01-25 ENCOUNTER — Ambulatory Visit
Admission: RE | Admit: 2020-01-25 | Discharge: 2020-01-25 | Disposition: A | Discharge status: Home / Self Care | Payer: Medicaid Other | Source: Ambulatory Visit | Attending: Obstetrics and Gynecology | Admitting: Obstetrics and Gynecology

## 2020-01-25 ENCOUNTER — Other Ambulatory Visit: Payer: Self-pay

## 2020-01-25 ENCOUNTER — Ambulatory Visit (HOSPITAL_BASED_OUTPATIENT_CLINIC_OR_DEPARTMENT_OTHER): Payer: Medicaid Other | Admitting: Physician Assistant

## 2020-01-25 VITALS — BP 135/82 | HR 85 | Temp 97.7°F | Ht 68.5 in | Wt 262.0 lb

## 2020-01-25 DIAGNOSIS — Z9119 Patient's noncompliance with other medical treatment and regimen: Secondary | ICD-10-CM

## 2020-01-25 DIAGNOSIS — Z91199 Patient's noncompliance with other medical treatment and regimen due to unspecified reason: Secondary | ICD-10-CM

## 2020-01-25 DIAGNOSIS — E1165 Type 2 diabetes mellitus with hyperglycemia: Secondary | ICD-10-CM

## 2020-01-25 DIAGNOSIS — Z09 Encounter for follow-up examination after completed treatment for conditions other than malignant neoplasm: Secondary | ICD-10-CM

## 2020-01-25 DIAGNOSIS — Z3687 Encounter for antenatal screening for uncertain dates: Secondary | ICD-10-CM | POA: Insufficient documentation

## 2020-01-25 DIAGNOSIS — Z3490 Encounter for supervision of normal pregnancy, unspecified, unspecified trimester: Secondary | ICD-10-CM

## 2020-01-25 LAB — POCT GLYCOSYLATED HEMOGLOBIN (HGB A1C): Hemoglobin A1C: 11.5 % — AB (ref 4.0–5.6)

## 2020-01-25 LAB — GLUCOSE, POCT (MANUAL RESULT ENTRY)
POC Glucose: 328 mg/dL — AB (ref 70–99)
POC Glucose: 347 mg/dl — AB (ref 70–99)

## 2020-01-25 MED ORDER — INSULIN GLARGINE 100 UNIT/ML ~~LOC~~ SOLN: 30.0000 [IU] | Freq: Every day | SUBCUTANEOUS | 5 refills | Status: DC

## 2020-01-25 MED ORDER — METFORMIN HCL 500 MG PO TABS: 1000.0000 mg | ORAL_TABLET | Freq: Two times a day (BID) | ORAL | 3 refills | Status: DC

## 2020-01-25 MED ORDER — INSULIN ASPART 100 UNIT/ML ~~LOC~~ SOLN
15.0000 [IU] | Freq: Once | SUBCUTANEOUS | Status: AC
  Administered 2020-01-25: 15 [IU] via SUBCUTANEOUS

## 2020-01-25 NOTE — Patient Instructions (Signed)
IT IS IMPERATIVE THAT YOU TAKE ALL OF YOUR MEDICATIONS AS DIRECTED AND DO NOT SKIP DOSES.  CHECK YOUR BLOOD SUGARS AT LEAST FASTING AND AT BEDTIME AND RECORD AND BRING TO NEXT VISIT.    DRINK 80-100 OUNCES WATER DAILY.  ELIMINATE SUGARS AND WHITE CARBOHYDRATES AS MUCH AS POSSIBLE FROM YOUR DIET.

## 2020-01-25 NOTE — Progress Notes (Signed)
Patient ID: [name], female   DOB: 1985/10/19, 34 y.o.   MRN: [id number]   [name], is a 34 y.o. female  CPN:[id number]  JIV:[id number]  DOB - 12/22/1985  Subjective:  Chief Complaint and HPI: [name] is a 34 y.o. female here today after being seen in the ED 01/16/2020 and found to be pregnant.  Patient has longstanding uncontrolled diabetes and non-complaint.  Only takes her meds 30-50% of the time.  For the last week, she says she is taking her metformin and lantus as prescribed.  Metformin causes nausea, but she can take it.  Has ob appt at 11 this morning.     ROS:   Constitutional:  No f/c, No night sweats, No unexplained weight loss. EENT:  No vision changes, No blurry vision, No hearing changes. No mouth, throat, or ear problems.  Respiratory: No cough, No SOB Cardiac: No CP, no palpitations GI:  No abd pain, No N/V/D. GU: No Urinary s/sx Musculoskeletal: No joint pain Neuro: No headache, no dizziness, no motor weakness.  Skin: No rash Endocrine:  No polydipsia. No polyuria.  Psych: Denies SI/HI  No problems updated.  ALLERGIES: No Known Allergies  PAST MEDICAL HISTORY: Past Medical History:  Diagnosis Date  . Asthma   . Diabetes mellitus without complication (HCC)   . 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 approx 3-4cm at time of 01/2016 c-section  . Herpes   . Trichomonas vaginitis     MEDICATIONS AT HOME: Prior to Admission medications   Medication Sig Start Date End Date Taking? Authorizing Provider  albuterol (PROVENTIL HFA;VENTOLIN HFA) 108 (90 Base) MCG/ACT inhaler Inhale 2 puffs into the lungs every 6 (six) hours as needed for wheezing or shortness of breath. 11/03/18  Yes Fulp, Cammie, MD  insulin glargine (LANTUS) 100 UNIT/ML injection Inject 0.3 mLs (30 Units total) into the skin at bedtime. Can stop Lantus if fasting blood sugars are 80 or less for 2 or more days 01/25/20  Yes Brittanee Ghazarian M, PA-C  metFORMIN  (GLUCOPHAGE) 500 MG tablet Take 2 tablets (1,000 mg total) by mouth 2 (two) times daily with a meal. 01/25/20  Yes Elky Funches M, PA-C  blood glucose meter kit and supplies KIT Dispense based on patient and insurance preference. Use up to four times daily as directed. (FOR ICD-10 E11.9). 01/17/18   Doristine Bosworth, MD  FLUoxetine (PROZAC) 10 MG tablet Take 10 mg by mouth daily. Patient not taking: Reported on 01/23/2020    [provider]  Lancet Devices (LANCING DEVICE) MISC Use to check blood glucose twice daily.  E 11.9 06/28/19   Hall-Potvin, [place], PA-C  valACYclovir (VALTREX) 500 MG tablet Take 1 tablet (500 mg total) by mouth 2 (two) times daily. One tablet twice per day for 3 days as needed for acute outbreaks Patient not taking: Reported on 01/23/2020 11/09/19   Anders Simmonds, PA-C     Objective:  EXAM:   Vitals:   01/25/20 0930  BP: 135/82  Pulse: 85  Temp: 97.7 F (36.5 C)  TempSrc: Temporal  SpO2: 100%  Weight: 262 lb (118.8 kg)  Height: 5' 8.5" (1.74 m)    General appearance : A&OX3. NAD. Non-toxic-appearing HEENT: Atraumatic and Normocephalic.  PERRLA. EOM intact.  Chest/Lungs:  Breathing-non-labored, Good air entry bilaterally, breath sounds normal without rales, rhonchi, or wheezing  CVS: S1 S2 regular, no murmurs, gallops, rubs  Extremities: Bilateral Lower Ext shows no edema, both legs are warm to touch  with = pulse throughout Neurology:  CN II-XII grossly intact, Non focal.   Psych:  TP linear. J/I WNL. Normal speech. Appropriate eye contact and affect.  Skin:  No Rash  Data Review Lab Results  Component Value Date   HGBA1C 11.5 (A) 01/25/2020   HGBA1C 11.1 (H) 11/09/2019   HGBA1C  08/18/2019     Comment:     >15     Assessment & Plan   1. Uncontrolled type 2 diabetes mellitus with hyperglycemia ([place]) Stop trulicity just due to higher pregnancy category with less long term studies.  Increase lantus to 30 units and Compliance is  imperative.  I discussed this with her at depth.  Patient verbalizes understanding and verbalizes hypoglycemic plan and to go to ED if blood sugars >350.   - POCT glycosylated hemoglobin (Hb A1C) - Glucose (CBG) - insulin aspart (novoLOG) injection 15 Units NOW - metFORMIN (GLUCOPHAGE) 500 MG tablet; Take 2 tablets (1,000 mg total) by mouth 2 (two) times daily with a meal.  Dispense: 120 tablet; Refill: 3 - insulin glargine (LANTUS) 100 UNIT/ML injection; Inject 0.3 mLs (30 Units total) into the skin at bedtime. Can stop Lantus if fasting blood sugars are 80 or less for 2 or more days  Dispense: 10 mL; Refill: 5  2. Noncompliance In AVS and discussed in person-patient verbalized understanding of all IT IS IMPERATIVE THAT YOU TAKE ALL OF YOUR MEDICATIONS AS DIRECTED AND DO NOT SKIP DOSES.  CHECK YOUR BLOOD SUGARS AT LEAST FASTING AND AT BEDTIME AND RECORD AND BRING TO NEXT VISIT.    DRINK 80-100 OUNCES WATER DAILY.  ELIMINATE SUGARS AND WHITE CARBOHYDRATES AS MUCH AS POSSIBLE FROM YOUR DIET.    3. Pregnancy, unspecified gestational age Has OB appt after this appt for first U/S to date pregnancy due to irregulare menses.   If they decide to take over diabetes care, please let us know  4. Encounter for examination following treatment at hospital   Patient have been counseled extensively about nutrition and exercise  Return in about 1 week (around 02/01/2020) for with [facility] for diabetes management.  patient is pregnant.  The patient was given clear instructions to go to ER or return to medical center if symptoms don't improve, worsen or new problems develop. The patient verbalized understanding. The patient was told to call to get lab results if they haven't heard anything in the next week.     Freeman Caldron, PA-C [facility] and [facility] [place], [place]   01/25/2020, 10:01 AM

## 2020-01-29 ENCOUNTER — Encounter: Payer: Self-pay | Admitting: *Deleted

## 2020-02-02 ENCOUNTER — Ambulatory Visit: Payer: Medicaid Other | Admitting: Pharmacist

## 2020-02-07 ENCOUNTER — Other Ambulatory Visit: Payer: Self-pay

## 2020-02-07 ENCOUNTER — Encounter: Payer: Self-pay | Admitting: Pharmacist

## 2020-02-07 ENCOUNTER — Other Ambulatory Visit: Payer: Self-pay | Admitting: Internal Medicine

## 2020-02-07 ENCOUNTER — Ambulatory Visit: Payer: Medicaid Other | Attending: Family Medicine | Admitting: Pharmacist

## 2020-02-07 ENCOUNTER — Telehealth: Payer: Self-pay

## 2020-02-07 DIAGNOSIS — O24111 Pre-existing diabetes mellitus, type 2, in pregnancy, first trimester: Secondary | ICD-10-CM | POA: Diagnosis not present

## 2020-02-07 DIAGNOSIS — E1110 Type 2 diabetes mellitus with ketoacidosis without coma: Secondary | ICD-10-CM

## 2020-02-07 DIAGNOSIS — E1165 Type 2 diabetes mellitus with hyperglycemia: Secondary | ICD-10-CM

## 2020-02-07 DIAGNOSIS — Z794 Long term (current) use of insulin: Secondary | ICD-10-CM | POA: Diagnosis not present

## 2020-02-07 DIAGNOSIS — Z3A01 Less than 8 weeks gestation of pregnancy: Secondary | ICD-10-CM | POA: Diagnosis not present

## 2020-02-07 MED ORDER — LEVEMIR FLEXTOUCH 100 UNIT/ML ~~LOC~~ SOPN: 18.0000 [IU] | PEN_INJECTOR | Freq: Two times a day (BID) | SUBCUTANEOUS | 2 refills | Status: DC

## 2020-02-07 NOTE — Progress Notes (Signed)
    S:    PCP: Dr. Chapman Fitch   No chief complaint on file.  Patient arrives today in good spirits. Presents for diabetes evaluation, education, and management at the request of Dr. Chapman Fitch. Patient was referred on 11/09/2019. I have seen her several times since. She was seen by Levada Dy on 01/25/2020 and revealed that she was pregnant. She is currently about [redacted] weeks pregnant.   Current diabetes medications include:  - Metformin  1000 mg BID - Lantus 20 units - Trulicity 1.5 mg weekly  Patient denies hypoglycemic events.  Patient reported dietary habits:  - Has switched to non-starchy vegetables and reports eliminating sugars.  - Admits that she recently has slipped in regards to her diet d/t stress at home. She is trying to get back on track.   Patient-reported exercise habits:  - Limited outside of childcare   Patient denies polyuria, polydipsia. Patient denies neuropathy. Patient denies visual changes.  O:  Home CBGs: Reports 200-300s.   Lab Results  Component Value Date   HGBA1C 11.5 (A) 01/25/2020   There were no vitals filed for this visit.  Lipid Panel  No results found for: CHOL, TRIG, HDL, CHOLHDL, VLDL, LDLCALC, LDLDIRECT  Clinical ASCVD: No  The ASCVD Risk score Mikey Bussing DC Jr., et al., 2013) failed to calculate for the following reasons:   The 2013 ASCVD risk score is only valid for ages 83 to 4   A/P: Diabetes longstanding currently uncontrolled. Patient is able to verbalize appropriate hypoglycemia management plan. Pt is newly pregnant. Insulin recommended in this setting. I have asked her to discontinue metformin, Lantus, and Trulicity. Will start BID Levemir instead. We will request the covering provider for Dr. Chapman Fitch to submit a referral to Endocrinology for further DM management.  -Stop current regimen.  -Start Levemir 18 units BID.  -Extensively discussed pathophysiology of DM, recommended lifestyle interventions, dietary effects on glycemic control -Counseled on  s/sx of and management of hypoglycemia -Next A1C anticipated 04/2020.   Benard Halsted, PharmD, [place] [phone]

## 2020-02-07 NOTE — Telephone Encounter (Addendum)
Pt request referral to Endocrinologist. Pt states [name] asked her to request the referral.  Also pt would likea referal to be seen sooner than August with OBGYN. Pt states she is pregnant and needs OB care & pt is diabetic. Pt ph [phone].

## 2020-02-07 NOTE — Telephone Encounter (Signed)
I placed the referral to endocrinology. I do not have any control over OB's scheduling. She is welcome to try to call to ask for an earlier visit. My guess is since she is only about [redacted] weeks pregnant currently that is why they are waiting until august because they typically have patients establish between 10-12 weeks of pregnancy.   Phill Myron, D.O. Primary Care at [facility]  02/07/2020, 4:09 PM

## 2020-02-08 NOTE — Telephone Encounter (Signed)
Please f/u  Copied from [place] [id number]. Topic: General - Other >> Feb 08, 2020  2:19 PM Hinda Lenis D wrote: Reason for CRM: PT is missing the needles for her medications, she asking for a prescription to be send / please advise

## 2020-02-09 MED ORDER — TRUEPLUS PEN NEEDLES 31G X 6 MM MISC: 6 refills | Status: DC

## 2020-02-09 NOTE — Telephone Encounter (Signed)
Please advise if prescription for needles.

## 2020-02-09 NOTE — Telephone Encounter (Signed)
Rx sent 

## 2020-02-14 ENCOUNTER — Telehealth: Payer: Self-pay | Admitting: Lactation Services

## 2020-02-14 DIAGNOSIS — Z3687 Encounter for antenatal screening for uncertain dates: Secondary | ICD-10-CM

## 2020-02-14 NOTE — Telephone Encounter (Addendum)
Scheduled Viabililty [place] for  7/21 at 8 am at [place]. Patient needs Pre Josem Kaufmann, will send message to [place].   Message to front office to schedule nurse appt afterwards.   Attempted to call patient, patient did not answer. LM for her to call the office. Will send My Chart message.   ----- Message from Chancy Milroy, MD sent at 02/12/2020 11:31 AM EDT ----- Please order follow up U/S to confirm viability. Thanks Legrand Como

## 2020-02-21 ENCOUNTER — Other Ambulatory Visit: Payer: Self-pay

## 2020-02-21 ENCOUNTER — Ambulatory Visit (INDEPENDENT_AMBULATORY_CARE_PROVIDER_SITE_OTHER): Payer: Medicaid Other | Admitting: General Practice

## 2020-02-21 ENCOUNTER — Encounter: Payer: Self-pay | Admitting: General Practice

## 2020-02-21 ENCOUNTER — Ambulatory Visit
Admission: RE | Admit: 2020-02-21 | Discharge: 2020-02-21 | Disposition: A | Discharge status: Home / Self Care | Payer: Medicaid Other | Source: Ambulatory Visit | Attending: Obstetrics and Gynecology | Admitting: Obstetrics and Gynecology

## 2020-02-21 DIAGNOSIS — O0990 Supervision of high risk pregnancy, unspecified, unspecified trimester: Secondary | ICD-10-CM

## 2020-02-21 DIAGNOSIS — O10919 Unspecified pre-existing hypertension complicating pregnancy, unspecified trimester: Secondary | ICD-10-CM | POA: Insufficient documentation

## 2020-02-21 DIAGNOSIS — Z3687 Encounter for antenatal screening for uncertain dates: Secondary | ICD-10-CM | POA: Diagnosis not present

## 2020-02-21 DIAGNOSIS — O24119 Pre-existing diabetes mellitus, type 2, in pregnancy, unspecified trimester: Secondary | ICD-10-CM | POA: Insufficient documentation

## 2020-02-21 DIAGNOSIS — O24111 Pre-existing diabetes mellitus, type 2, in pregnancy, first trimester: Secondary | ICD-10-CM

## 2020-02-21 DIAGNOSIS — O219 Vomiting of pregnancy, unspecified: Secondary | ICD-10-CM

## 2020-02-21 DIAGNOSIS — Z712 Person consulting for explanation of examination or test findings: Secondary | ICD-10-CM

## 2020-02-21 MED ORDER — PROMETHAZINE HCL 25 MG PO TABS: 25.0000 mg | ORAL_TABLET | Freq: Four times a day (QID) | ORAL | 0 refills | Status: DC | PRN

## 2020-02-21 MED ORDER — BLOOD PRESSURE KIT: 1.0000 | PACK | Freq: Once | 0 refills | Status: AC

## 2020-02-21 NOTE — Progress Notes (Signed)
Patient presents to office today for viability ultrasound results. Reviewed results with Dr Ilda Basset who finds single living IUP. Dr Ilda Basset states patient needs new OB appt next week given uncontrolled diabetes type 2 and appt with diabetes education asap as well.   Informed patient of results, provided pictures, & reviewed dating. Discussed follow up with diabetes education- this is her first pregnancy with having diabetes. Also discussed new OB appt next week.   OB history, med/surgical history, family history, & genetic/infectious history reviewed. Order placed for BRX & instructions of use given. Order placed for test strips, lancets, & meter as well as BP cuff. Advised patient bring BP cuff to office to ensure proper use and calibration. Medical history is significant for [facility] & type 2 diabetes.  Patient will need OB labs, Ob box completed, medicaid home form given, DV screen, & menstrual hx collected at new OB appt. Partial OB intake completed today.  Koren Bound RN BSN 02/22/20

## 2020-02-22 ENCOUNTER — Emergency Department (HOSPITAL_COMMUNITY)
Admission: EM | Admit: 2020-02-22 | Discharge: 2020-02-22 | Disposition: A | Discharge status: Home / Self Care | Payer: Medicaid Other | Attending: Emergency Medicine | Admitting: Emergency Medicine

## 2020-02-22 ENCOUNTER — Other Ambulatory Visit: Payer: Self-pay

## 2020-02-22 ENCOUNTER — Telehealth: Payer: Self-pay | Admitting: General Practice

## 2020-02-22 ENCOUNTER — Other Ambulatory Visit: Payer: Self-pay | Admitting: Obstetrics and Gynecology

## 2020-02-22 ENCOUNTER — Encounter: Payer: Self-pay | Admitting: General Practice

## 2020-02-22 DIAGNOSIS — Z5321 Procedure and treatment not carried out due to patient leaving prior to being seen by health care provider: Secondary | ICD-10-CM | POA: Diagnosis not present

## 2020-02-22 DIAGNOSIS — R739 Hyperglycemia, unspecified: Secondary | ICD-10-CM | POA: Diagnosis not present

## 2020-02-22 DIAGNOSIS — Z3A09 9 weeks gestation of pregnancy: Secondary | ICD-10-CM | POA: Diagnosis not present

## 2020-02-22 DIAGNOSIS — O26891 Other specified pregnancy related conditions, first trimester: Secondary | ICD-10-CM | POA: Insufficient documentation

## 2020-02-22 LAB — BASIC METABOLIC PANEL
Anion gap: 11 (ref 5–15)
BUN: 6 mg/dL (ref 6–20)
CO2: 24 mmol/L (ref 22–32)
Calcium: 9.5 mg/dL (ref 8.9–10.3)
Chloride: 89 mmol/L — ABNORMAL LOW (ref 98–111)
Creatinine, Ser: 0.85 mg/dL (ref 0.44–1.00)
GFR calc Af Amer: >60 mL/min (ref >60)
GFR calc non Af Amer: >60 mL/min (ref >60)
Glucose, Bld: 534 mg/dL (ref 70–99)
Potassium: 4.8 mmol/L (ref 3.5–5.1)
Sodium: 124 mmol/L — ABNORMAL LOW (ref 135–145)

## 2020-02-22 LAB — URINALYSIS, ROUTINE W REFLEX MICROSCOPIC
Bacteria, UA: NONE SEEN
Bilirubin Urine: NEGATIVE
Glucose, UA: >=500 mg/dL — AB
Hgb urine dipstick: NEGATIVE
Ketones, ur: 20 mg/dL — AB
Leukocytes,Ua: NEGATIVE
Nitrite: NEGATIVE
Protein, ur: NEGATIVE mg/dL
Specific Gravity, Urine: 1.024 (ref 1.005–1.030)
pH: 6 (ref 5.0–8.0)

## 2020-02-22 LAB — CBC
HCT: 41.7 % (ref 36.0–46.0)
Hemoglobin: 14.2 g/dL (ref 12.0–15.0)
MCH: 26.6 pg (ref 26.0–34.0)
MCHC: 34.1 g/dL (ref 30.0–36.0)
MCV: 78.2 fL — ABNORMAL LOW (ref 80.0–100.0)
Platelets: 325 10*3/uL (ref 150–400)
RBC: 5.33 MIL/uL — ABNORMAL HIGH (ref 3.87–5.11)
RDW: 13.3 % (ref 11.5–15.5)
WBC: 9 10*3/uL (ref 4.0–10.5)
nRBC: 0 % (ref 0.0–0.2)

## 2020-02-22 LAB — I-STAT BETA HCG BLOOD, ED (MC, WL, AP ONLY): I-stat hCG, quantitative: >2000 m[IU]/mL — ABNORMAL HIGH (ref <5)

## 2020-02-22 MED ORDER — ACCU-CHEK GUIDE W/DEVICE KIT: 1.0000 | PACK | Freq: Once | 0 refills | Status: AC

## 2020-02-22 MED ORDER — INSULIN ASPART 100 UNIT/ML ~~LOC~~ SOLN
15.0000 [IU] | Freq: Three times a day (TID) | SUBCUTANEOUS | 12 refills | Status: DC
Start: 2020-02-22 — End: 2020-03-04

## 2020-02-22 MED ORDER — INSULIN NPH (HUMAN) (ISOPHANE) 100 UNIT/ML ~~LOC~~ SUSP
39.0000 [IU] | Freq: Two times a day (BID) | SUBCUTANEOUS | 3 refills | Status: DC
Start: 2020-02-22 — End: 2020-03-04

## 2020-02-22 MED ORDER — ACCU-CHEK GUIDE VI STRP
ORAL_STRIP | 12 refills | Status: DC
Start: 2020-02-22 — End: 2020-07-01

## 2020-02-22 MED ORDER — ACCU-CHEK SOFTCLIX LANCETS MISC: 12 refills | Status: DC

## 2020-02-22 NOTE — Progress Notes (Signed)
Patient was assessed and managed by nursing staff during this encounter. I have reviewed the chart and agree with the documentation and plan. I have also made any necessary editorial changes.  Aletha Halim, MD 02/22/2020 7:38 PM

## 2020-02-22 NOTE — Progress Notes (Signed)
Pt on levemir 18 bid from her PCP but CBGs severely elevated per RN. Pt 120kg. Will do NPH 39/39 and aspart 20U tidac and d/c the levemir. Pt to inform pt. She has appt next week  Durene Romans MD Attending Center for [employer] (Faculty Practice) 02/22/2020 Time: 1148am

## 2020-02-22 NOTE — ED Notes (Signed)
Pt informed RN that she was leaving.  RN apologized for wait, encouraging pt to call primary provider in the morning.  Encouraged pt to take her prescribed medication and return if she needs to.

## 2020-02-22 NOTE — ED Triage Notes (Signed)
Pt just bought a new glucometer, and for two weeks prior to that had not checked her blood sugar. Readings today are "high." [redacted] weeks pregnant. Endorses n/v which she has had throughout the pregnancy.

## 2020-02-22 NOTE — Telephone Encounter (Signed)
Spoke with Dr Ilda Basset regarding patient's elevated BP and blood sugars at home. Dr Ilda Basset advises patient check BP once daily & upload into babyscripts at least until first appt. Dr Ilda Basset also recommends patient stop current insulin management and start new prescriptions sent to pharmacy.   Called patient, no answer- left message requesting she call me back regarding a change in her medications. mychart message also sent.   Attempted to call patient second time, no answer- left message asking to call me back to discuss change in medications.

## 2020-02-23 ENCOUNTER — Encounter: Payer: Self-pay | Admitting: General Practice

## 2020-02-29 ENCOUNTER — Other Ambulatory Visit (HOSPITAL_COMMUNITY)
Admission: RE | Admit: 2020-02-29 | Discharge: 2020-02-29 | Disposition: A | Discharge status: Home / Self Care | Payer: Medicaid Other | Source: Ambulatory Visit | Attending: Obstetrics and Gynecology | Admitting: Obstetrics and Gynecology

## 2020-02-29 ENCOUNTER — Encounter: Payer: Self-pay | Admitting: Obstetrics and Gynecology

## 2020-02-29 ENCOUNTER — Inpatient Hospital Stay (HOSPITAL_COMMUNITY)
Admission: AD | Admit: 2020-02-29 | Discharge: 2020-03-04 | DRG: 832 | Disposition: A | Discharge status: Home / Self Care | Payer: Medicaid Other | Attending: Obstetrics and Gynecology | Admitting: Obstetrics and Gynecology

## 2020-02-29 ENCOUNTER — Ambulatory Visit (INDEPENDENT_AMBULATORY_CARE_PROVIDER_SITE_OTHER): Payer: Medicaid Other | Admitting: Obstetrics and Gynecology

## 2020-02-29 ENCOUNTER — Other Ambulatory Visit: Payer: Self-pay

## 2020-02-29 VITALS — BP 130/75 | HR 98 | Wt 255.3 lb

## 2020-02-29 DIAGNOSIS — O99211 Obesity complicating pregnancy, first trimester: Secondary | ICD-10-CM | POA: Diagnosis present

## 2020-02-29 DIAGNOSIS — O10911 Unspecified pre-existing hypertension complicating pregnancy, first trimester: Secondary | ICD-10-CM | POA: Diagnosis not present

## 2020-02-29 DIAGNOSIS — D252 Subserosal leiomyoma of uterus: Secondary | ICD-10-CM | POA: Diagnosis present

## 2020-02-29 DIAGNOSIS — O99011 Anemia complicating pregnancy, first trimester: Secondary | ICD-10-CM | POA: Diagnosis present

## 2020-02-29 DIAGNOSIS — O24111 Pre-existing diabetes mellitus, type 2, in pregnancy, first trimester: Secondary | ICD-10-CM | POA: Diagnosis not present

## 2020-02-29 DIAGNOSIS — O99281 Endocrine, nutritional and metabolic diseases complicating pregnancy, first trimester: Secondary | ICD-10-CM | POA: Diagnosis present

## 2020-02-29 DIAGNOSIS — IMO0002 Reserved for concepts with insufficient information to code with codable children: Secondary | ICD-10-CM | POA: Diagnosis present

## 2020-02-29 DIAGNOSIS — O10011 Pre-existing essential hypertension complicating pregnancy, first trimester: Secondary | ICD-10-CM | POA: Diagnosis present

## 2020-02-29 DIAGNOSIS — Z98891 History of uterine scar from previous surgery: Secondary | ICD-10-CM | POA: Diagnosis not present

## 2020-02-29 DIAGNOSIS — E669 Obesity, unspecified: Secondary | ICD-10-CM | POA: Diagnosis not present

## 2020-02-29 DIAGNOSIS — O3411 Maternal care for benign tumor of corpus uteri, first trimester: Secondary | ICD-10-CM | POA: Diagnosis present

## 2020-02-29 DIAGNOSIS — Z794 Long term (current) use of insulin: Secondary | ICD-10-CM

## 2020-02-29 DIAGNOSIS — D250 Submucous leiomyoma of uterus: Secondary | ICD-10-CM | POA: Diagnosis present

## 2020-02-29 DIAGNOSIS — O34219 Maternal care for unspecified type scar from previous cesarean delivery: Secondary | ICD-10-CM | POA: Diagnosis present

## 2020-02-29 DIAGNOSIS — Z9114 Patient's other noncompliance with medication regimen: Secondary | ICD-10-CM

## 2020-02-29 DIAGNOSIS — E1165 Type 2 diabetes mellitus with hyperglycemia: Secondary | ICD-10-CM | POA: Diagnosis not present

## 2020-02-29 DIAGNOSIS — Z3A10 10 weeks gestation of pregnancy: Secondary | ICD-10-CM

## 2020-02-29 DIAGNOSIS — Z87891 Personal history of nicotine dependence: Secondary | ICD-10-CM | POA: Diagnosis not present

## 2020-02-29 DIAGNOSIS — D573 Sickle-cell trait: Secondary | ICD-10-CM | POA: Diagnosis present

## 2020-02-29 DIAGNOSIS — D5740 Sickle-cell thalassemia without crisis: Secondary | ICD-10-CM | POA: Diagnosis not present

## 2020-02-29 DIAGNOSIS — O0990 Supervision of high risk pregnancy, unspecified, unspecified trimester: Secondary | ICD-10-CM | POA: Insufficient documentation

## 2020-02-29 DIAGNOSIS — Z20822 Contact with and (suspected) exposure to covid-19: Secondary | ICD-10-CM | POA: Diagnosis present

## 2020-02-29 DIAGNOSIS — E871 Hypo-osmolality and hyponatremia: Secondary | ICD-10-CM | POA: Diagnosis present

## 2020-02-29 LAB — POCT URINALYSIS DIP (DEVICE)
Bilirubin Urine: NEGATIVE
Glucose, UA: 500 mg/dL — AB
Hgb urine dipstick: NEGATIVE
Ketones, ur: 15 mg/dL — AB
Leukocytes,Ua: NEGATIVE
Nitrite: NEGATIVE
Protein, ur: NEGATIVE mg/dL
Specific Gravity, Urine: <=1.005 (ref 1.005–1.030)
Urobilinogen, UA: 0.2 mg/dL (ref 0.0–1.0)
pH: 5.5 (ref 5.0–8.0)

## 2020-02-29 LAB — SARS CORONAVIRUS 2 BY RT PCR (HOSPITAL ORDER, PERFORMED IN ~~LOC~~ HOSPITAL LAB): SARS Coronavirus 2: NEGATIVE

## 2020-02-29 LAB — GLUCOSE, CAPILLARY: Glucose-Capillary: 348 mg/dL — ABNORMAL HIGH (ref 70–99)

## 2020-02-29 MED ORDER — ZOLPIDEM TARTRATE 5 MG PO TABS: 5.0000 mg | ORAL_TABLET | Freq: Every evening | ORAL | Status: DC | PRN

## 2020-02-29 MED ORDER — SODIUM CHLORIDE 0.9% FLUSH: 3.0000 mL | Freq: Two times a day (BID) | INTRAVENOUS | Status: DC

## 2020-02-29 MED ORDER — DOCUSATE SODIUM 100 MG PO CAPS
100.0000 mg | ORAL_CAPSULE | Freq: Every day | ORAL | Status: DC
  Administered 2020-03-02 – 2020-03-03 (×2): 100 mg via ORAL
  Filled 2020-02-29 (×3): qty 1

## 2020-02-29 MED ORDER — INSULIN DETEMIR 100 UNIT/ML ~~LOC~~ SOLN
18.0000 [IU] | Freq: Two times a day (BID) | SUBCUTANEOUS | Status: DC
  Administered 2020-02-29 – 2020-03-01 (×2): 18 [IU] via SUBCUTANEOUS
  Filled 2020-02-29 (×3): qty 0.18

## 2020-02-29 MED ORDER — INSULIN ASPART 100 UNIT/ML ~~LOC~~ SOLN
15.0000 [IU] | Freq: Three times a day (TID) | SUBCUTANEOUS | Status: DC
  Administered 2020-02-29 – 2020-03-01 (×2): 15 [IU] via SUBCUTANEOUS

## 2020-02-29 MED ORDER — SODIUM CHLORIDE 0.9 % IV SOLN: 250.0000 mL | INTRAVENOUS | Status: DC | PRN

## 2020-02-29 MED ORDER — SODIUM CHLORIDE 0.9% FLUSH: 3.0000 mL | INTRAVENOUS | Status: DC | PRN

## 2020-02-29 MED ORDER — PRENATAL MULTIVITAMIN CH
1.0000 | ORAL_TABLET | Freq: Every day | ORAL | Status: DC
  Filled 2020-02-29: qty 1

## 2020-02-29 MED ORDER — CALCIUM CARBONATE ANTACID 500 MG PO CHEW: 2.0000 | CHEWABLE_TABLET | ORAL | Status: DC | PRN

## 2020-02-29 MED ORDER — ACETAMINOPHEN 325 MG PO TABS: 650.0000 mg | ORAL_TABLET | ORAL | Status: DC | PRN

## 2020-02-29 NOTE — Progress Notes (Signed)
NEW OB packet given  Home Medicaid Form completed  

## 2020-02-29 NOTE — H&P (Signed)
FACULTY PRACTICE ANTEPARTUM ADMISSION HISTORY AND PHYSICAL NOTE   History of Present Illness: [name] is a 34 y.o. [id number] at [redacted]w[redacted]d admitted for diabetic control.    Pt seen today for initial OB visit. Known Class B DM, who has been noncompliant with her insulin regiment. CBG's  200-300's. Pt was amendable for admission for diabetic control.    Patient Active Problem List   Diagnosis Date Noted  . Uncontrolled diabetes mellitus ([place]) 02/29/2020  . Supervision of high risk pregnancy, antepartum 02/21/2020  . Chronic hypertension affecting pregnancy 02/21/2020  . Type 2 diabetes mellitus in pregnancy 02/21/2020  . Anxiety 08/27/2018  . New persistent daily headache 01/10/2018  . Uterine fibroid in pregnancy 01/10/2018  . Family history of diabetes mellitus in mother 01/10/2018  . Paresthesia of both feet 01/10/2018  . Visual field scotoma of both eyes 01/10/2018  . Other headache syndrome 01/10/2018  . History of cesarean delivery, currently pregnant 01/08/2016  . Asthma, mild intermittent 08/14/2015  . Obesity in pregnancy   . Sickle cell trait ([place]) 09/01/2014  . BMI 40.0-44.9, adult ([place]) 04/28/2012    Past Medical History:  Diagnosis Date  . Asthma   . Diabetes mellitus without complication ([place])   . DKA (diabetic ketoacidoses) ([place]) 08/27/2018  . 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 approx 3-4cm at time of 01/2016 c-section  . GBS (group B streptococcus) infection   . Herpes   . Hypertension   . Newly diagnosed diabetes ([place]) 01/10/2018   Diagnosed 01/10/18  A1c 7.5%  . Trichomonas vaginitis     Past Surgical History:  Procedure Laterality Date  . CESAREAN SECTION N/A 01/08/2016   Procedure: CESAREAN SECTION;  Surgeon: Aletha Halim, MD;  Location: [facility];  Service: Obstetrics;  Laterality: N/A;  . DILATION AND CURETTAGE OF UTERUS      OB History  Gravida Para Term Preterm AB Living  5 3 3  0 1 3  SAB TAB Ectopic Multiple Live  Births  1 0 0 0 3    # Outcome Date GA Lbr Len/2nd Weight Sex Delivery Anes PTL Lv  5 Current           4 Term 01/08/16 [redacted]w[redacted]d  3055 g F CS-LTranv EPI  LIV  3 Term 03/05/15 [redacted]w[redacted]d 10:44 / 00:04 2860 g F Vag-Spont EPI, Local  LIV  2 Term 11/02/12 [redacted]w[redacted]d  3317 g F Vag-Spont EPI N LIV  1 SAB 2014            Social History   Socioeconomic History  . Marital status: Legally Separated    Spouse name: Not on file  . Number of children: Not on file  . Years of education: Not on file  . Highest education level: Not on file  Occupational History  . Not on file  Tobacco Use  . Smoking status: Former Smoker    Types: Cigarettes  . Smokeless tobacco: Never Used  . Tobacco comment: quit 2013  Vaping Use  . Vaping Use: Never used  Substance and Sexual Activity  . Alcohol use: No  . Drug use: No  . Sexual activity: Yes    Birth control/protection: None  Other Topics Concern  . Not on file  Social History Narrative  . Not on file   Social Determinants of Health   Financial Resource Strain:   . Difficulty of Paying Living Expenses:   Food Insecurity: No Food Insecurity  . Worried About Charity fundraiser in  the Last Year: Never true  . Ran Out of Food in the Last Year: Never true  Transportation Needs: Unmet Transportation Needs  . Lack of Transportation (Medical): Yes  . Lack of Transportation (Non-Medical): Yes  Physical Activity:   . Days of Exercise per Week:   . Minutes of Exercise per Session:   Stress:   . Feeling of Stress :   Social Connections:   . Frequency of Communication with Friends and Family:   . Frequency of Social Gatherings with Friends and Family:   . Attends Religious Services:   . Active Member of Clubs or Organizations:   . Attends Archivist Meetings:   Marland Kitchen Marital Status:     Family History  Problem Relation Age of Onset  . Diabetes Mother   . Hypertension Mother   . Hyperlipidemia Mother   . Heart disease Mother   . Breast cancer  Mother        5  . Ovarian cancer Mother        Unknown age  . Diabetes Sister   . Diabetes Brother   . Breast cancer Cousin   . Heart disease Maternal Grandmother     No Known Allergies  Medications Prior to Admission  Medication Sig Dispense Refill Last Dose  . Accu-Chek Softclix Lancets lancets Use as instructed QID 100 each 12   . albuterol (PROVENTIL HFA;VENTOLIN HFA) 108 (90 Base) MCG/ACT inhaler Inhale 2 puffs into the lungs every 6 (six) hours as needed for wheezing or shortness of breath. 1 Inhaler 2   . glucose blood (ACCU-CHEK GUIDE) test strip Use as instructed QID 100 each 12   . insulin aspart (NOVOLOG) 100 UNIT/ML injection Inject 15 Units into the skin 3 (three) times daily before meals. 10 mL 12   . insulin NPH Human (NOVOLIN N) 100 UNIT/ML injection Inject 0.39 mLs (39 Units total) into the skin in the morning and at bedtime. With breakfast and qhs 10 mL 3   . Insulin Pen Needle (TRUEPLUS PEN NEEDLES) 31G X 6 MM MISC Use to inject Levemir BID. 100 each 6   . promethazine (PHENERGAN) 25 MG tablet Take 1 tablet (25 mg total) by mouth every 6 (six) hours as needed for nausea or vomiting. 30 tablet 0     Review of Systems - Negative  Vitals:  BP (!) 139/82   Pulse 98   Temp 98.3 F (36.8 C) (Oral)   Ht 5\' 8"  (1.727 m)   Wt (!) 115.8 kg   LMP 11/17/2019 (Exact Date)   BMI 38.82 kg/m  Physical Examination: CONSTITUTIONAL: Well-developed, well-nourished female in no acute distress.  HENT:  Normocephalic, atraumatic, External right and left ear normal. Oropharynx is clear and moist EYES: Conjunctivae and EOM are normal. Pupils are equal, round, and reactive to light. No scleral icterus.  NECK: Normal range of motion, supple, no masses SKIN: Skin is warm and dry. No rash noted. Not diaphoretic. No erythema. No pallor. [place]: Alert and oriented to person, place, and time. Normal reflexes, muscle tone coordination. No cranial nerve deficit noted. PSYCHIATRIC:  Normal mood and affect. Normal behavior. Normal judgment and thought content. CARDIOVASCULAR: Normal heart rate noted, regular rhythm RESPIRATORY: Effort and breath sounds normal, no problems with respiration noted ABDOMEN: Soft, nontender, nondistended, gravid. MUSCULOSKELETAL: Normal range of motion. No edema and no tenderness. 2+ distal pulses. GU deferred  Labs:  Results for orders placed or performed in visit on 02/29/20 (from the past 24 hour(s))  POCT  urinalysis dip (device)   Collection Time: 02/29/20  2:09 PM  Result Value Ref Range   Glucose, UA 500 (A) NEGATIVE mg/dL   Bilirubin Urine NEGATIVE NEGATIVE   Ketones, ur 15 (A) NEGATIVE mg/dL   Specific Gravity, Urine <=1.005 1.005 - 1.030   Hgb urine dipstick NEGATIVE NEGATIVE   pH 5.5 5.0 - 8.0   Protein, ur NEGATIVE NEGATIVE mg/dL   Urobilinogen, UA 0.2 0.0 - 1.0 mg/dL   Nitrite NEGATIVE NEGATIVE   Leukocytes,Ua NEGATIVE NEGATIVE    Imaging Studies: US OB LESS THAN 14 WEEKS WITH OB TRANSVAGINAL  Result Date: 02/21/2020 CLINICAL DATA:  First trimester pregnancy, uncertain LMP, assessment of dating and viability EXAM: OBSTETRIC <14 WK [place] AND TRANSVAGINAL OB US TECHNIQUE: Both transabdominal and transvaginal ultrasound examinations were performed for complete evaluation of the gestation as well as the maternal uterus, adnexal regions, and pelvic cul-de-sac. Transvaginal technique was performed to assess early pregnancy. COMPARISON:  01/25/2020 FINDINGS: Intrauterine gestational sac: Present, single Yolk sac:  Present Embryo:  Present Cardiac Activity: Present Heart Rate: 164 bpm CRL:  225 22.4 mm   8 w   6 d                  [place] EDC: 09/26/2020 Subchorionic hemorrhage:  None visualized. Maternal uterus/adnexae: Subserosal leiomyoma posterior LEFT uterus 3.5 x 2.3 x 2.3 cm. Additional submucosal leiomyoma anterior RIGHT uterus 2.3 x 2.3 x 1.2 cm. RIGHT ovary measures 2.1 x 2.3 x 2.3 cm and contains a small corpus luteum. LEFT ovary not  visualized, likely obscured by bowel loops. No free pelvic fluid or adnexal masses. IMPRESSION: Single live intrauterine gestation at 8 weeks 6 days EGA. Nonvisualization of LEFT ovary. Two uterine leiomyomata identified as above. Electronically Signed   By: Lavonia Dana M.D.   On: 02/21/2020 09:18     Assessment and Plan: IUP 10 0/7 weeks Uncontrolled Class B DM Sickle cell trait H/O C section H/O uterine fibriods  Admit to Antenatal Will start insulin regiment. DM educator consult. Dietician consult Prenatal labs drawn in office today  POC reviewed with pt. Pt verbalized understanding.   Amaiya Scruton L. Rip Harbour, MD, [place] Attending [place], [facility]

## 2020-02-29 NOTE — Progress Notes (Signed)
Prenatal Visit Note Date: 02/29/2020 Clinic: [facility]  New OB visit  Subjective:  [name] is a 34 y.o. [id number] at [redacted]w[redacted]d being seen today for ongoing prenatal care.  She is currently monitored for the following issues for this high-risk pregnancy and has Sickle cell trait ([place]); Obesity in pregnancy; BMI 40.0-44.9, adult ([place]); Asthma, mild intermittent; History of cesarean delivery, currently pregnant; New persistent daily headache; Uterine fibroid in pregnancy; Family history of diabetes mellitus in mother; Paresthesia of both feet; Visual field scotoma of both eyes; Other headache syndrome; Anxiety; Supervision of high risk pregnancy, antepartum; Chronic hypertension affecting pregnancy; and Type 2 diabetes mellitus in pregnancy on their problem list.  Patient reports lethargy.   Contractions: Not present. Vag. Bleeding: None.   . Denies leaking of fluid.   The following portions of the patient's history were reviewed and updated as appropriate: allergies, current medications, past family history, past medical history, past social history, past surgical history and problem list. Problem list updated.  Objective:   Vitals:   02/29/20 1335  BP: (!) 130/75  Pulse: 98  Weight: (!) 255 lb 4.8 oz (115.8 kg)    Fetal Status: Fetal Heart Rate (bpm): 172         General:  Alert, oriented and cooperative. Patient is in no acute distress.  Skin: Skin is warm and dry. No rash noted.   Cardiovascular: Normal heart rate noted  Respiratory: Normal respiratory effort, no problems with respiration noted  Abdomen: Soft, gravid, appropriate for gestational age. Pain/Pressure: Absent     Pelvic:  Cervical exam deferred        Extremities: Normal range of motion.  Edema: None  Mental Status: Normal mood and affect. Normal behavior. Normal judgment and thought content.   Urinalysis:      Assessment and Plan:  Pregnancy: G5P3013 at [redacted]w[redacted]d  1. Supervision of high  risk pregnancy, antepartum NOB labs today. See below. Recommend and will get NT scan due to high risk of fetal anomalies. Start asa in 3-4wks - Culture, OB Urine - GC/Chlamydia probe amp ([place])not at [facility] - Genetic Screening - US MFM OB DETAIL +14 WK - Hemoglobin A1c - CBC/D/Plt+RPR+Rh+ABO+Rub Ab... - Comprehensive metabolic panel - Protein / creatinine ratio, urine - [place] MFM Fetal Nuchal Translucency; Future - TSH  2. Pregnancy with type 2 diabetes mellitus in first trimester Last a1c via POCT was 11-12. Pt states she is checking her CBGs sometimes; she doesn't have her log book or meter but states lowest it has been is 230. She states that the only insulin she takes is sometimes she remembers to take novolog 15 units with meals. She states she doesn't take any other insulin; she was also prescribed NPH 39 and 39 the same day as the novolog. Long d/w her re: poor BS control in pregnancy including birth defects, HTN, FGR, LGA, etc. I told her I recommend inpatient admission for BS titration but when she ultimately goes home that she will need to take responsibility for CBG monitoring, diet and insulin. Pt amenable to admission today. [facility] called and bedspace available and attending aware.   Scan set up optho visit at later visits Needs fetal echo set up at later visits  3. Social Patient has court on Monday and Tuesday. I told her that we will hopefully be able to discharge her on Sunday. Recommend SW consult when admitted  4. cHTN  5. [place] trait  6. BMI 30s  7. H/o SVD  and then PLTCS for cord prolapse   Preterm labor symptoms and general obstetric precautions including but not limited to vaginal bleeding, contractions, leaking of fluid and fetal movement were reviewed in detail with the patient. Please refer to After Visit Summary for other counseling recommendations.  Return in about 2 weeks (around 03/14/2020) for high risk, in person.   Aletha Halim, MD

## 2020-03-01 ENCOUNTER — Encounter: Payer: Self-pay | Admitting: Obstetrics and Gynecology

## 2020-03-01 DIAGNOSIS — O10911 Unspecified pre-existing hypertension complicating pregnancy, first trimester: Secondary | ICD-10-CM

## 2020-03-01 DIAGNOSIS — Z98891 History of uterine scar from previous surgery: Secondary | ICD-10-CM

## 2020-03-01 DIAGNOSIS — E871 Hypo-osmolality and hyponatremia: Secondary | ICD-10-CM | POA: Insufficient documentation

## 2020-03-01 DIAGNOSIS — Z3A10 10 weeks gestation of pregnancy: Secondary | ICD-10-CM

## 2020-03-01 DIAGNOSIS — E669 Obesity, unspecified: Secondary | ICD-10-CM

## 2020-03-01 DIAGNOSIS — D573 Sickle-cell trait: Secondary | ICD-10-CM

## 2020-03-01 DIAGNOSIS — O24111 Pre-existing diabetes mellitus, type 2, in pregnancy, first trimester: Principal | ICD-10-CM

## 2020-03-01 DIAGNOSIS — Z9114 Patient's other noncompliance with medication regimen: Secondary | ICD-10-CM

## 2020-03-01 DIAGNOSIS — O99211 Obesity complicating pregnancy, first trimester: Secondary | ICD-10-CM

## 2020-03-01 DIAGNOSIS — Z794 Long term (current) use of insulin: Secondary | ICD-10-CM

## 2020-03-01 DIAGNOSIS — O1210 Gestational proteinuria, unspecified trimester: Secondary | ICD-10-CM | POA: Insufficient documentation

## 2020-03-01 DIAGNOSIS — E1165 Type 2 diabetes mellitus with hyperglycemia: Secondary | ICD-10-CM

## 2020-03-01 LAB — COMPREHENSIVE METABOLIC PANEL
ALT: 9 IU/L (ref 0–32)
ALT: 12 U/L (ref 0–44)
AST: 11 IU/L (ref 0–40)
AST: 10 U/L — ABNORMAL LOW (ref 15–41)
Albumin/Globulin Ratio: 1.6 (ref 1.2–2.2)
Albumin: 3.9 g/dL (ref 3.8–4.8)
Albumin: 3.1 g/dL — ABNORMAL LOW (ref 3.5–5.0)
Alkaline Phosphatase: 82 IU/L (ref 48–121)
Alkaline Phosphatase: 64 U/L (ref 38–126)
Anion gap: 8 (ref 5–15)
BUN/Creatinine Ratio: 9 (ref 9–23)
BUN: 7 mg/dL (ref 6–20)
BUN: 5 mg/dL — ABNORMAL LOW (ref 6–20)
Bilirubin Total: <0.2 mg/dL (ref 0.0–1.2)
CO2: 18 mmol/L — ABNORMAL LOW (ref 20–29)
CO2: 25 mmol/L (ref 22–32)
Calcium: 8.5 mg/dL — ABNORMAL LOW (ref 8.7–10.2)
Calcium: 8.7 mg/dL — ABNORMAL LOW (ref 8.9–10.3)
Chloride: 91 mmol/L — ABNORMAL LOW (ref 96–106)
Chloride: 95 mmol/L — ABNORMAL LOW (ref 98–111)
Creatinine, Ser: 0.77 mg/dL (ref 0.57–1.00)
Creatinine, Ser: 0.83 mg/dL (ref 0.44–1.00)
GFR calc Af Amer: 117 mL/min/{1.73_m2} (ref >59)
GFR calc Af Amer: >60 mL/min (ref >60)
GFR calc non Af Amer: 102 mL/min/{1.73_m2} (ref >59)
GFR calc non Af Amer: >60 mL/min (ref >60)
Globulin, Total: 2.4 g/dL (ref 1.5–4.5)
Glucose, Bld: 216 mg/dL — ABNORMAL HIGH (ref 70–99)
Glucose: 624 mg/dL (ref 65–99)
Potassium: 4.2 mmol/L (ref 3.5–5.2)
Potassium: 4 mmol/L (ref 3.5–5.1)
Sodium: 124 mmol/L — ABNORMAL LOW (ref 134–144)
Sodium: 128 mmol/L — ABNORMAL LOW (ref 135–145)
Total Bilirubin: 0.3 mg/dL (ref 0.3–1.2)
Total Protein: 6.3 g/dL (ref 6.0–8.5)
Total Protein: 6 g/dL — ABNORMAL LOW (ref 6.5–8.1)

## 2020-03-01 LAB — CBC/D/PLT+RPR+RH+ABO+RUB AB...
Antibody Screen: NEGATIVE
Basophils Absolute: 0 10*3/uL (ref 0.0–0.2)
Basos: 1 %
EOS (ABSOLUTE): 0 10*3/uL (ref 0.0–0.4)
Eos: 1 %
HCV Ab: <0.1 s/co ratio (ref 0.0–0.9)
HIV Screen 4th Generation wRfx: NONREACTIVE
Hematocrit: 37.9 % (ref 34.0–46.6)
Hemoglobin: 12.3 g/dL (ref 11.1–15.9)
Hepatitis B Surface Ag: NEGATIVE
Immature Grans (Abs): 0 10*3/uL (ref 0.0–0.1)
Immature Granulocytes: 0 %
Lymphocytes Absolute: 1.9 10*3/uL (ref 0.7–3.1)
Lymphs: 33 %
MCH: 26.9 pg (ref 26.6–33.0)
MCHC: 32.5 g/dL (ref 31.5–35.7)
MCV: 83 fL (ref 79–97)
Monocytes Absolute: 0.3 10*3/uL (ref 0.1–0.9)
Monocytes: 5 %
Neutrophils Absolute: 3.5 10*3/uL (ref 1.4–7.0)
Neutrophils: 60 %
Platelets: 267 10*3/uL (ref 150–450)
RBC: 4.57 x10E6/uL (ref 3.77–5.28)
RDW: 14.6 % (ref 11.7–15.4)
RPR Ser Ql: NONREACTIVE
Rh Factor: POSITIVE
Rubella Antibodies, IGG: <0.9 index — ABNORMAL LOW (ref >0.99)
WBC: 5.7 10*3/uL (ref 3.4–10.8)

## 2020-03-01 LAB — TYPE AND SCREEN
ABO/RH(D): A POS
Antibody Screen: NEGATIVE

## 2020-03-01 LAB — PROTEIN / CREATININE RATIO, URINE
Creatinine, Urine: 15 mg/dL
Protein, Ur: 9.4 mg/dL
Protein/Creat Ratio: 627 mg/g creat — ABNORMAL HIGH (ref 0–200)

## 2020-03-01 LAB — HEMOGLOBIN A1C
Est. average glucose Bld gHb Est-mCnc: 352 mg/dL
Hgb A1c MFr Bld: 13.9 % — ABNORMAL HIGH (ref 4.8–5.6)

## 2020-03-01 LAB — GLUCOSE, CAPILLARY: Glucose-Capillary: 259 mg/dL — ABNORMAL HIGH (ref 70–99)

## 2020-03-01 LAB — GC/CHLAMYDIA PROBE AMP (~~LOC~~) NOT AT ARMC
Chlamydia: NEGATIVE
Comment: NEGATIVE
Comment: NORMAL
Neisseria Gonorrhea: NEGATIVE

## 2020-03-01 LAB — HCV INTERPRETATION

## 2020-03-01 LAB — TSH: TSH: 1.29 u[IU]/mL (ref 0.450–4.500)

## 2020-03-01 LAB — MAGNESIUM: Magnesium: 1.8 mg/dL (ref 1.7–2.4)

## 2020-03-01 MED ORDER — SODIUM CHLORIDE 0.9 % IV SOLN: INTRAVENOUS | Status: DC

## 2020-03-01 MED ORDER — INSULIN ASPART 100 UNIT/ML ~~LOC~~ SOLN
0.0000 [IU] | Freq: Three times a day (TID) | SUBCUTANEOUS | Status: DC
  Administered 2020-03-01 (×2): 6 [IU] via SUBCUTANEOUS
  Administered 2020-03-02: 4 [IU] via SUBCUTANEOUS
  Administered 2020-03-02: 9 [IU] via SUBCUTANEOUS
  Administered 2020-03-02 – 2020-03-03 (×2): 4 [IU] via SUBCUTANEOUS
  Administered 2020-03-03 – 2020-03-04 (×2): 6 [IU] via SUBCUTANEOUS

## 2020-03-01 MED ORDER — NONFORMULARY OR COMPOUNDED ITEM
2.0000 | Freq: Every morning | Status: DC
  Administered 2020-03-01 – 2020-03-04 (×4): 2 via ORAL
  Filled 2020-03-01 (×7): qty 1

## 2020-03-01 MED ORDER — INSULIN ASPART 100 UNIT/ML ~~LOC~~ SOLN
18.0000 [IU] | Freq: Three times a day (TID) | SUBCUTANEOUS | Status: DC
  Administered 2020-03-01 (×2): 18 [IU] via SUBCUTANEOUS

## 2020-03-01 MED ORDER — PRENATAL ADULT GUMMY/DHA/FA 0.4-25 MG PO CHEW
2.0000 | CHEWABLE_TABLET | Freq: Every morning | ORAL | Status: DC
  Filled 2020-03-01 (×2): qty 1

## 2020-03-01 MED ORDER — INSULIN DETEMIR 100 UNIT/ML ~~LOC~~ SOLN
22.0000 [IU] | Freq: Two times a day (BID) | SUBCUTANEOUS | Status: DC
  Administered 2020-03-01 – 2020-03-02 (×2): 22 [IU] via SUBCUTANEOUS
  Filled 2020-03-01 (×3): qty 0.22

## 2020-03-01 NOTE — Progress Notes (Addendum)
CBGs 2 hours post prandial 2000: 176

## 2020-03-01 NOTE — Progress Notes (Signed)
2200 bedtime CBG: 110

## 2020-03-01 NOTE — Progress Notes (Signed)
Verified and counted pt's prenatal meds brought from home with pt and pharmacy tech.

## 2020-03-01 NOTE — Progress Notes (Signed)
[place] PROGRESS NOTE  [name] is a 34 y.o. [phone] at [redacted]w[redacted]d who is admitted for uncontrolled Type 2 DM.  Estimated Date of Delivery: 09/26/20 Fetal presentation is unsure.  Length of Stay:  1 Days. Admitted 02/29/2020  Subjective:  Patient reports she has been unable to check CBGs 2/2 to work schedule but that today was going to be her last day so she will be able to manage diabetes better. She denies uterine contractions, denies bleeding and leaking of fluid per vagina.  Vitals:  Blood pressure 121/78, pulse 90, temperature 98 F (36.7 C), temperature source Oral, resp. rate 16, height 5\' 8"  (1.727 m), weight (!) 115.8 kg, last menstrual period 11/17/2019, SpO2 100 %. Physical Examination: CONSTITUTIONAL: Well-developed, well-nourished female in no acute distress.  HENT:  Normocephalic, atraumatic, External right and left ear normal. Oropharynx is clear and moist EYES: Conjunctivae and EOM are normal. Pupils are equal, round, and reactive to light. No scleral icterus.  NECK: Normal range of motion, supple, no masses. SKIN: Skin is warm and dry. No rash noted. Not diaphoretic. No erythema. No pallor. [place]: Alert and oriented to person, place, and time. Normal reflexes, muscle tone coordination. No cranial nerve deficit noted. PSYCHIATRIC: Normal mood and affect. Normal behavior. Normal judgment and thought content. CARDIOVASCULAR: Normal heart rate noted RESPIRATORY: Effort normal, no problems with respiration noted MUSCULOSKELETAL: Normal range of motion. No edema and no tenderness. ABDOMEN: Soft, nontender, nondistended CERVIX: deferred  Results for orders placed or performed during the hospital encounter of 02/29/20 (from the past 48 hour(s))  SARS Coronavirus 2 by RT PCR (hospital order, performed in [facility] hospital lab) Nasopharyngeal Nasopharyngeal Swab     Status: None   Collection Time: 02/29/20  4:20 PM   Specimen: Nasopharyngeal Swab   Result Value Ref Range   SARS Coronavirus 2 NEGATIVE NEGATIVE    Comment: (NOTE) SARS-CoV-2 target nucleic acids are NOT DETECTED.  The SARS-CoV-2 RNA is generally detectable in upper and lower respiratory specimens during the acute phase of infection. The lowest concentration of SARS-CoV-2 viral copies this assay can detect is 250 copies / mL. A negative result does not preclude SARS-CoV-2 infection and should not be used as the sole basis for treatment or other patient management decisions.  A negative result may occur with improper specimen collection / handling, submission of specimen other than nasopharyngeal swab, presence of viral mutation(s) within the areas targeted by this assay, and inadequate number of viral copies (<250 copies / mL). A negative result must be combined with clinical observations, patient history, and epidemiological information.  Fact Sheet for Patients:   StrictlyIdeas.no  Fact Sheet for Healthcare Providers: BankingDealers.co.za  This test is not yet approved or  cleared by the [place] FDA and has been authorized for detection and/or diagnosis of SARS-CoV-2 by FDA under an Emergency Use Authorization (EUA).  This EUA will remain in effect (meaning this test can be used) for the duration of the COVID-19 declaration under Section 564(b)(1) of the Act, 21 U.S.C. section 360bbb-3(b)(1), unless the authorization is terminated or revoked sooner.  Performed at [facility], [place] [address], [place], [place] [address]   Glucose, capillary     Status: Abnormal   Collection Time: 02/29/20  8:15 PM  Result Value Ref Range   Glucose-Capillary 348 (H) 70 - 99 mg/dL    Comment: Glucose reference range applies only to samples taken after fasting for at least 8 hours.  Glucose, capillary  Status: Abnormal   Collection Time: 03/01/20  9:43 AM  Result Value Ref Range   Glucose-Capillary 259 (H) 70 -  99 mg/dL    Comment: Glucose reference range applies only to samples taken after fasting for at least 8 hours.    I have reviewed the patient's current medications.  ASSESSMENT: Active Problems:   Uncontrolled diabetes mellitus (HCC)   PLAN:  T2DM - levemir 18>22 units BID and novolog 15>18 units TID started yesterday (per medicine regimen), SSI - will increase as needed - pt forgot to get fasting CBG prior to eating this am - appreciate diabetic coordinator recommendations - start freestyle libre - 24 hr Cr collection  Hyponatremia - start NS 125 mL/hr - repeat CMP now  Continue routine antenatal care.   Feliz Beam, M.D. Attending Center for [employer] [facility] (Faculty Practice)  03/01/2020 12:33 PM

## 2020-03-01 NOTE — Progress Notes (Signed)
24 hour urine creatinine started on 03/01/20 at 1400.

## 2020-03-01 NOTE — Progress Notes (Signed)
Inpatient Diabetes Program Recommendations  AACE/ADA: New Consensus Statement on Inpatient Glycemic Control (2015)  Target Ranges:  Prepandial:   less than 140 mg/dL      Peak postprandial:   less than 180 mg/dL (1-2 hours)      Critically ill patients:  140 - 180 mg/dL   Lab Results  Component Value Date   GLUCAP 348 (H) 02/29/2020   HGBA1C 13.9 (H) 02/29/2020    Review of Glycemic Control Results for [name], [name] (MRN [id number]) as of 03/01/2020 08:41  Ref. Range 02/29/2020 20:15  Glucose-Capillary Latest Ref Range: 70 - 99 mg/dL 348 (H)   Diabetes history: Type 2 DM Outpatient Diabetes medications: Novolog 15 units TID, NPH 39 units BID Current orders for Inpatient glycemic control: Levemir 18 units BID, Novolog 15 units TID  Inpatient Diabetes Program Recommendations:    Consider increasing NPH to 22 units BID, Novolog to 18 units TID and adding Novolog 0-24 units TID for two hours post prandial coverage (under Diabetes Treatment in Pregnancy Order set).  Discussed plan of care with Dr Rosana Hoes. Orders provided. Additionally, provider in agreement with [facility] application. Applied at 1045.   Spoke with patient regarding outpatient diabetes management. Patient was diagnosed in 2019 and has been followed by [place] prior to pregnancy for diabetes. Admits to missing dosages due to work schedule and home stressors.  Patient's plan for the remainder of the pregnancy is to stay of work to help "better take care of my health". Reviewed patient's current A1c of 13.9%. Explained what a A1c is and what it measures. Also reviewed goal A1c with patient, importance of good glucose control @ home, and blood sugar goals. Reviewed patho of DM, need for insulin, role of pancreas, hormonal fluctuations contributing to insulin resistance, hyperinsulinemia in neonates, increased oxygenation metabolism in embryo in first trimester, increased risks of NICU admission, LGA,  stillbirth, vascular changes and other maternal commorbidities. Patient has a meter, but struggles to check blood sugars 4 times per day. Has needed supplies. Encouraged to increase frequency of CBGs, reviewed recommendations of postprandial measures and reviewed the importance of pro activity and when to call MD. Additionally, reviewed [employer], risks and benefits, cost, how to establish coverage with Medicaid and application. Patient is interested. Per MD order device applied at 1045.  Dietitian left paperwork. Will contact to further reinforce. Patient was in the bathroom during last attempt. Reviewed portion sizes, foods that are higher in carbohydrates, plate method, daily CHO allotment and importance of being mindful.  Patient has no further questions at this time. Will plan to reach back out to patient in AM and follow glucose trends.   Thanks, Bronson Curb, MSN, RNC-OB Diabetes Coordinator [phone] (8a-5p)

## 2020-03-01 NOTE — Progress Notes (Signed)
  Nutrition Consult: education  Nutrition Dx: Food and nutrition-related knowledge deficit r/t limited comprehension of previous education aeb pt report.  Nutrition education consult for Carbohydrate Modified Gestational Diabetic Diet completed.  "Meal  plan for gestational diabetics" handout given to patient.  Basic concepts reviewed: meal and snack pattern/ CHO limits, timing of meals, Increase protein intake to avoid hunger between meals, basic CHO counting.  Questions answered.  Patient verbalizes understanding. However, pt will need continued reinforcement of above as an outpt.  Weyman Rodney M.Fredderick Severance LDN Neonatal Nutrition Support Specialist/RD III

## 2020-03-01 NOTE — Progress Notes (Signed)
1354- 2 hours PP CBG: 182.

## 2020-03-01 NOTE — Progress Notes (Addendum)
Unable to obtain fasting CBG this am. Pt had already eaten 75% of her breakfast when RN entered room. Reminded pt to notify staff when she receives a meal tray. Pt verbalized understanding.

## 2020-03-01 NOTE — Progress Notes (Signed)
CSW consulted due to patient having court on Monday.   CSW met with patient at bedside and introduced self. CSW explained reason for consult and inquired about how patient was feeling, patient reported that she was feeling okay. Patient confirmed that she had divorce court on Monday and may need a letter. CSW provided letter. CSW inquired about any other needs/concerns, patient reported none. Patient thanked CSW.   Kimberly Long, LCSW Clinical Social Worker [facility] Cell#: [phone]  

## 2020-03-01 NOTE — Progress Notes (Signed)
2 RN's attempted to get fetal heart tones via doppler. Notified Dr. Rosana Hoes. En route to department to perform bedside u/s.

## 2020-03-02 ENCOUNTER — Encounter (HOSPITAL_COMMUNITY): Payer: Self-pay | Admitting: Obstetrics and Gynecology

## 2020-03-02 LAB — BASIC METABOLIC PANEL
Anion gap: 6 (ref 5–15)
BUN: 5 mg/dL — ABNORMAL LOW (ref 6–20)
CO2: 24 mmol/L (ref 22–32)
Calcium: 8.5 mg/dL — ABNORMAL LOW (ref 8.9–10.3)
Chloride: 100 mmol/L (ref 98–111)
Creatinine, Ser: 0.76 mg/dL (ref 0.44–1.00)
GFR calc Af Amer: >60 mL/min (ref >60)
GFR calc non Af Amer: >60 mL/min (ref >60)
Glucose, Bld: 166 mg/dL — ABNORMAL HIGH (ref 70–99)
Potassium: 3.8 mmol/L (ref 3.5–5.1)
Sodium: 130 mmol/L — ABNORMAL LOW (ref 135–145)

## 2020-03-02 LAB — CREATININE, URINE, 24 HOUR
Collection Interval-UCRE24: 24 hours
Creatinine, 24H Ur: 2212 mg/d — ABNORMAL HIGH (ref 600–1800)
Creatinine, Urine: 63.19 mg/dL
Urine Total Volume-UCRE24: 3500 mL

## 2020-03-02 LAB — CBG MONITORING, ED: CBG: 159

## 2020-03-02 MED ORDER — ONDANSETRON HCL 4 MG PO TABS
4.0000 mg | ORAL_TABLET | Freq: Three times a day (TID) | ORAL | Status: DC | PRN
  Administered 2020-03-02 – 2020-03-03 (×2): 4 mg via ORAL
  Filled 2020-03-02 (×2): qty 1

## 2020-03-02 MED ORDER — INSULIN DETEMIR 100 UNIT/ML ~~LOC~~ SOLN
26.0000 [IU] | Freq: Two times a day (BID) | SUBCUTANEOUS | Status: DC
  Administered 2020-03-02 – 2020-03-04 (×4): 26 [IU] via SUBCUTANEOUS
  Filled 2020-03-02 (×4): qty 0.26

## 2020-03-02 MED ORDER — CLOTRIMAZOLE 1 % VA CREA
1.0000 | TOPICAL_CREAM | Freq: Every day | VAGINAL | Status: DC
  Administered 2020-03-02 – 2020-03-03 (×3): 1 via VAGINAL
  Filled 2020-03-02: qty 45

## 2020-03-02 MED ORDER — INSULIN DETEMIR 100 UNIT/ML ~~LOC~~ SOLN
4.0000 [IU] | Freq: Once | SUBCUTANEOUS | Status: AC
  Administered 2020-03-02: 4 [IU] via SUBCUTANEOUS
  Filled 2020-03-02: qty 0.04

## 2020-03-02 MED ORDER — INSULIN ASPART 100 UNIT/ML ~~LOC~~ SOLN
22.0000 [IU] | Freq: Three times a day (TID) | SUBCUTANEOUS | Status: DC
  Administered 2020-03-02 – 2020-03-04 (×7): 22 [IU] via SUBCUTANEOUS

## 2020-03-02 NOTE — Progress Notes (Signed)
CBG prior to dinner = 173 via [place].

## 2020-03-02 NOTE — Progress Notes (Signed)
CBG prior lunch via Freestyle Libre = 165

## 2020-03-02 NOTE — Progress Notes (Signed)
CBG 2 hours after lunch = 209 via [place].

## 2020-03-02 NOTE — Progress Notes (Addendum)
Patient ID: [name], female   DOB: 30-Jan-1986, 34 y.o.   MRN: [id number] [place]) NOTE  [name] is a 34 y.o. [id number] at [redacted]w[redacted]d by LMP who is admitted for diabetic control.   Fetal presentation is n/a. Length of Stay:  2  Days  Subjective: Pt now on Freestyle Libre Monitor   Vitals:  Blood pressure 123/72, pulse 83, temperature 98.5 F (36.9 C), temperature source Oral, resp. rate 17, height 5\' 8"  (1.727 m), weight (!) 115.8 kg, last menstrual period 11/17/2019, SpO2 100 %. Physical Examination:  General appearance - alert, well appearing, and in no distress, oriented to person, place, and time and overweight Heart - normal rate and regular rhythm Abdomen - soft, nontender, nondistended Fundal Height:   Cervical Exam: not done extremities normal, atraumatic, no cyanosis or edema and Homans sign is negative, no sign of DVT with DTRs 2+ bilaterally Membranes:  Fetal Monitoring:  fhr seen on u;/s by me last pm. Good FM  Labs:  Results for orders placed or performed during the hospital encounter of 02/29/20 (from the past 24 hour(s))  Comprehensive metabolic panel   Collection Time: 03/01/20  1:35 PM  Result Value Ref Range   Sodium 128 (L) 135 - 145 mmol/L   Potassium 4.0 3.5 - 5.1 mmol/L   Chloride 95 (L) 98 - 111 mmol/L   CO2 25 22 - 32 mmol/L   Glucose, Bld 216 (H) 70 - 99 mg/dL   BUN 5 (L) 6 - 20 mg/dL   Creatinine, Ser 0.83 0.44 - 1.00 mg/dL   Calcium 8.7 (L) 8.9 - 10.3 mg/dL   Total Protein 6.0 (L) 6.5 - 8.1 g/dL   Albumin 3.1 (L) 3.5 - 5.0 g/dL   AST 10 (L) 15 - 41 U/L   ALT 12 0 - 44 U/L   Alkaline Phosphatase 64 38 - 126 U/L   Total Bilirubin 0.3 0.3 - 1.2 mg/dL   GFR calc non Af Amer >60 >60 mL/min   GFR calc Af Amer >60 >60 mL/min   Anion gap 8 5 - 15  Magnesium   Collection Time: 03/01/20  1:35 PM  Result Value Ref Range   Magnesium 1.8 1.7 - 2.4 mg/dL  Type and screen [place]    Collection Time: 03/01/20  1:38 PM  Result Value Ref Range   ABO/RH(D) A POS    Antibody Screen NEG    Sample Expiration      03/04/2020,2359 Performed at [facility], [address] [place], [place] [address]     Imaging Studies:     Currently EPIC will not allow sonographic studies to automatically populate into notes.  In the meantime, copy and paste results into note or free text.  Medications:  Scheduled . clotrimazole  1 Applicatorful Vaginal QHS  . docusate sodium  100 mg Oral Daily  . insulin aspart  0-24 Units Subcutaneous TID PC  . insulin aspart  22 Units Subcutaneous TID WC  . insulin detemir  22 Units Subcutaneous BID  . NONFORMULARY OR COMPOUNDED ITEM 2 each  2 each Oral q morning - 10a  . sodium chloride flush  3 mL Intravenous Q12H   I have reviewed the patient's current medications.  ASSESSMENT: Patient Active Problem List   Diagnosis Date Noted  . Proteinuria affecting pregnancy 03/01/2020  . Hyponatremia 03/01/2020  . Uncontrolled diabetes mellitus ([place]) 02/29/2020  . Supervision of high risk pregnancy, antepartum 02/21/2020  . Chronic  hypertension affecting pregnancy 02/21/2020  . Type 2 diabetes mellitus in pregnancy 02/21/2020  . Anxiety 08/27/2018  . New persistent daily headache 01/10/2018  . Uterine fibroid in pregnancy 01/10/2018  . Family history of diabetes mellitus in mother 01/10/2018  . Paresthesia of both feet 01/10/2018  . Visual field scotoma of both eyes 01/10/2018  . Other headache syndrome 01/10/2018  . History of cesarean delivery, currently pregnant 01/08/2016  . Asthma, mild intermittent 08/14/2015  . Obesity in pregnancy   . Sickle cell trait ([place]) 09/01/2014  . BMI 40.0-44.9, adult ([place]) 04/28/2012    PLAN: Continue insulin adjustmentsCurrently Novolog 22 u q meal Levemir 22 u bid increased to 26 u bid after diabetic Ed  text discussion. Complete 24 hr TP today at 2 pm. Probable d/c Monday , has court obligations  that morning.  Jonnie Kind 03/02/2020,10:09 AM

## 2020-03-02 NOTE — Progress Notes (Signed)
0600 fasting CBG: 166

## 2020-03-02 NOTE — Progress Notes (Signed)
2 hr. PP breaskfast CBG=159 per "Freestyle Libre."

## 2020-03-02 NOTE — Progress Notes (Signed)
Fasting CBG 158 per " Freestyle Libre."

## 2020-03-02 NOTE — Progress Notes (Addendum)
Inpatient Diabetes Program Recommendations  AACE/ADA: New Consensus Statement on Inpatient Glycemic Control (2015)  Target Ranges:  Prepandial:   less than 140 mg/dL      Peak postprandial:   less than 180 mg/dL (1-2 hours)      Critically ill patients:  140 - 180 mg/dL   Lab Results  Component Value Date   GLUCAP 259 (H) 03/01/2020   HGBA1C 13.9 (H) 02/29/2020    Review of Glycemic Control  Diabetes history: Type 2 DM Outpatient Diabetes medications: Novolog 15 units TID, NPH 39 units BID Current orders for Inpatient glycemic control: Levemir 22 units BID, Novolog 22 units TID, Novolog 0-24 units TID  Inpatient Diabetes Program Recommendations:    Consider increasing Levemir to 26 units BID.   Addendum: Spoke with patient to reinforce concepts discussed yesterday. Encouraged patient to follow up with endocrinology. Reviewed when to call MD, frequency of CBg checks, survival skills and interventions. Patient has no further questions at this time.  Thanks, Bronson Curb, MSN, RNC-OB Diabetes Coordinator [phone] (8a-5p)

## 2020-03-03 DIAGNOSIS — D5740 Sickle-cell thalassemia without crisis: Secondary | ICD-10-CM

## 2020-03-03 LAB — PROTEIN, URINE, 24 HOUR
Collection Interval-UPROT: 24 hours
Protein, 24H Urine: 210 mg/d — ABNORMAL HIGH (ref 50–100)
Protein, Urine: 6 mg/dL
Urine Total Volume-UPROT: 3500 mL

## 2020-03-03 MED ORDER — INSULIN ASPART 100 UNIT/ML ~~LOC~~ SOLN: 22.0000 [IU] | Freq: Once | SUBCUTANEOUS | Status: DC

## 2020-03-03 NOTE — Progress Notes (Signed)
Patient returned to floor from cafeteria with food, also had tray delivered at same time.

## 2020-03-03 NOTE — Progress Notes (Signed)
Patient ID: [name], female   DOB: March 21, 1986, 34 y.o.   MRN: [id number] [place]) NOTE  [name] is a 34 y.o. [id number] at [redacted]w[redacted]d by LMP who is admitted for glycemic control.   Fetal presentation is n/a. Length of Stay:  3  Days  Subjective: Patient reports feeling well and is without complaints. She denies vaginal bleeding, or persistent cramping pain   Vitals:  Blood pressure 112/70, pulse 82, temperature 98.3 F (36.8 C), temperature source Oral, resp. rate 17, height 5\' 8"  (1.727 m), weight (!) 115.8 kg, last menstrual period 11/17/2019, SpO2 100 %. Physical Examination:  General appearance - alert, well appearing, and in no distress, oriented to person, place, and time and overweight Heart - normal rate and regular rhythm Abdomen - soft, nontender, nondistended Cervical Exam: not done extremities normal, atraumatic, no cyanosis or edema and Homans sign is negative, no sign of DVT with DTRs 2+ bilaterally Membranes:  Fetal Monitoring:  Positive doppler 168  Labs:  Results for orders placed or performed during the hospital encounter of 02/29/20 (from the past 24 hour(s))  Basic metabolic panel   Collection Time: 03/02/20 10:14 AM  Result Value Ref Range   Sodium 130 (L) 135 - 145 mmol/L   Potassium 3.8 3.5 - 5.1 mmol/L   Chloride 100 98 - 111 mmol/L   CO2 24 22 - 32 mmol/L   Glucose, Bld 166 (H) 70 - 99 mg/dL   BUN 5 (L) 6 - 20 mg/dL   Creatinine, Ser 0.76 0.44 - 1.00 mg/dL   Calcium 8.5 (L) 8.9 - 10.3 mg/dL   GFR calc non Af Amer >60 >60 mL/min   GFR calc Af Amer >60 >60 mL/min   Anion gap 6 5 - 15  Results for orders placed or performed during the hospital encounter of 02/22/20 (from the past 24 hour(s))  CBG monitoring, ED   Collection Time: 03/02/20 10:00 AM  Result Value Ref Range   CBG 159     Imaging Studies:     Currently EPIC will not allow sonographic studies to automatically populate into notes.  In the  meantime, copy and paste results into note or free text.  Medications:  Scheduled . clotrimazole  1 Applicatorful Vaginal QHS  . docusate sodium  100 mg Oral Daily  . insulin aspart  0-24 Units Subcutaneous TID PC  . insulin aspart  22 Units Subcutaneous TID WC  . insulin detemir  26 Units Subcutaneous BID  . NONFORMULARY OR COMPOUNDED ITEM 2 each  2 each Oral q morning - 10a  . sodium chloride flush  3 mL Intravenous Q12H   I have reviewed the patient's current medications.  ASSESSMENT: Patient Active Problem List   Diagnosis Date Noted  . Proteinuria affecting pregnancy 03/01/2020  . Hyponatremia 03/01/2020  . Uncontrolled diabetes mellitus ([place]) 02/29/2020  . Supervision of high risk pregnancy, antepartum 02/21/2020  . Chronic hypertension affecting pregnancy 02/21/2020  . Type 2 diabetes mellitus in pregnancy 02/21/2020  . Anxiety 08/27/2018  . New persistent daily headache 01/10/2018  . Uterine fibroid in pregnancy 01/10/2018  . Family history of diabetes mellitus in mother 01/10/2018  . Paresthesia of both feet 01/10/2018  . Visual field scotoma of both eyes 01/10/2018  . Other headache syndrome 01/10/2018  . History of cesarean delivery, currently pregnant 01/08/2016  . Asthma, mild intermittent 08/14/2015  . Obesity in pregnancy   . Sickle cell trait ([place]) 09/01/2014  . BMI 40.0-44.9, adult ([place]) 04/28/2012  PLAN: CBGs reviewed and slowly approaching goal- fasting this morning 178 Continue insulin adjustments per diabetic educator. Currently on Novolog 22 u q meal Levemir increased from 26 to 28 u bid yesterday.  Continue inpatient management. Patient is hoping to be discharged by noon tomorrow due to a court obligation  Jermon Chalfant 03/03/2020,7:27 AM

## 2020-03-03 NOTE — Progress Notes (Signed)
BS=70, called Dr. Harolyn Rutherford regarding question for Novolog 22 units with dinner. Per MD, check BS 1 hour after meal then will re-evaluate. Continue to monitor.

## 2020-03-04 MED ORDER — CLOTRIMAZOLE 1 % VA CREA: 1.0000 | TOPICAL_CREAM | Freq: Every day | VAGINAL | 0 refills | Status: DC

## 2020-03-04 MED ORDER — LEVEMIR FLEXTOUCH 100 UNIT/ML ~~LOC~~ SOPN: 28.0000 [IU] | PEN_INJECTOR | Freq: Two times a day (BID) | SUBCUTANEOUS | 11 refills | Status: DC

## 2020-03-04 MED ORDER — FREESTYLE LIBRE 14 DAY SENSOR MISC: 1.0000 | 3 refills | Status: DC | PRN

## 2020-03-04 MED ORDER — FREESTYLE LIBRE 14 DAY READER DEVI: 1.0000 | 3 refills | Status: DC | PRN

## 2020-03-04 MED ORDER — INSULIN ASPART 100 UNIT/ML ~~LOC~~ SOLN
22.0000 [IU] | Freq: Three times a day (TID) | SUBCUTANEOUS | 12 refills | Status: DC
Start: 2020-03-04 — End: 2020-03-19

## 2020-03-04 NOTE — Plan of Care (Signed)
Patient made aware of changes to insulin dose and why. Observed giving self insulin and updated on plan of care.

## 2020-03-04 NOTE — Progress Notes (Signed)
Discharge to home °

## 2020-03-04 NOTE — Discharge Summary (Signed)
Antenatal Physician Discharge Summary  Patient ID: [name] MRN: [id number] DOB/AGE: 33-May-1987 33 y.o.  Admit date: 02/29/2020 Discharge date: 03/04/2020  Admission Diagnoses:Active Problems:   Uncontrolled diabetes mellitus ([place])   Discharge Diagnoses: Same  Prenatal Procedures: none  Consults: diabetes education and nutrition  Hospital Course:  [name] is a 34 y.o. [phone] with IUP at [redacted]w[redacted]d admitted for poorly controlled diabetes.  Initial hemoglobin A1c of 13.9.  Patient was admitted from the office due to ongoing poor glycemic control being in the first trimester.  She was admitted, diabetes education consulted, nutrition reviewed diet with her, she was placed on Levemir twice daily, NovoLog with meals, and sliding scale insulin.  Her CBGs were monitored, she had improved glycemic control.  Her insulins were adjusted upward accordingly.  Her CBGs prior to discharge were 129/293/194/124.  She did have a low blood sugar 69 and was given juice and then a and that resulted in a higher sugar.  Her Levemir was increased to 28 units twice daily following this.  She was deemed stable for discharge to home with outpatient follow up.  Consider OmniPod as outpatient.  Discharge Exam: Temp:  [98.2 F (36.8 C)-98.5 F (36.9 C)] 98.2 F (36.8 C) (08/02 0751) Pulse Rate:  [78-88] 78 (08/02 0751) Resp:  [16-18] 18 (08/02 0751) BP: (100-119)/(56-75) 119/75 (08/02 0751) SpO2:  [99 %-100 %] 99 % (08/02 0751) Physical Examination: CONSTITUTIONAL: Well-developed, well-nourished female in no acute distress.  HENT:  Normocephalic, atraumatic, External right and left ear normal. Oropharynx is clear and moist EYES: Conjunctivae and EOM are normal. Pupils are equal, round, and reactive to light. No scleral icterus.  NECK: Normal range of motion, supple, no masses SKIN: Skin is warm and dry. No rash noted. Not diaphoretic. No erythema. No pallor. [place]: Alert and oriented to person,  place, and time. Normal reflexes, muscle tone coordination. No cranial nerve deficit noted. PSYCHIATRIC: Normal mood and affect. Normal behavior. Normal judgment and thought content. CARDIOVASCULAR: Normal heart rate noted, regular rhythm RESPIRATORY: Effort and breath sounds normal, no problems with respiration noted MUSCULOSKELETAL: Normal range of motion. No edema and no tenderness. 2+ distal pulses. ABDOMEN: Soft, nontender, nondistended, gravid.  Fetal monitoring: Fetal heart tones by Doppler were positive throughout the hospitalization  Significant Diagnostic Studies:  Results for orders placed or performed during the hospital encounter of 02/29/20 (from the past 168 hour(s))  SARS Coronavirus 2 by RT PCR (hospital order, performed in [place] hospital lab) Nasopharyngeal Nasopharyngeal Swab   Collection Time: 02/29/20  4:20 PM   Specimen: Nasopharyngeal Swab  Result Value Ref Range   SARS Coronavirus 2 NEGATIVE NEGATIVE  Glucose, capillary   Collection Time: 02/29/20  8:15 PM  Result Value Ref Range   Glucose-Capillary 348 (H) 70 - 99 mg/dL  Glucose, capillary   Collection Time: 03/01/20  9:43 AM  Result Value Ref Range   Glucose-Capillary 259 (H) 70 - 99 mg/dL  Comprehensive metabolic panel   Collection Time: 03/01/20  1:35 PM  Result Value Ref Range   Sodium 128 (L) 135 - 145 mmol/L   Potassium 4.0 3.5 - 5.1 mmol/L   Chloride 95 (L) 98 - 111 mmol/L   CO2 25 22 - 32 mmol/L   Glucose, Bld 216 (H) 70 - 99 mg/dL   BUN 5 (L) 6 - 20 mg/dL   Creatinine, Ser 0.83 0.44 - 1.00 mg/dL   Calcium 8.7 (L) 8.9 - 10.3 mg/dL   Total Protein 6.0 (L) 6.5 -  8.1 g/dL   Albumin 3.1 (L) 3.5 - 5.0 g/dL   AST 10 (L) 15 - 41 U/L   ALT 12 0 - 44 U/L   Alkaline Phosphatase 64 38 - 126 U/L   Total Bilirubin 0.3 0.3 - 1.2 mg/dL   GFR calc non Af Amer >60 >60 mL/min   GFR calc Af Amer >60 >60 mL/min   Anion gap 8 5 - 15  Magnesium   Collection Time: 03/01/20  1:35 PM  Result Value Ref Range    Magnesium 1.8 1.7 - 2.4 mg/dL  Type and screen [place]   Collection Time: 03/01/20  1:38 PM  Result Value Ref Range   ABO/RH(D) A POS    Antibody Screen NEG    Sample Expiration      03/04/2020,2359 Performed at [facility], [place] [address], [place], [place] [address]   Creatinine, urine, 24 hour   Collection Time: 03/01/20  2:00 PM  Result Value Ref Range   Urine Total Volume-UCRE24 3,500 mL   Collection Interval-UCRE24 24 hours   Creatinine, Urine 63.19 mg/dL   Creatinine, 24H Ur 2,212 (H) 600 - 1,800 mg/day  Protein, urine, 24 hour   Collection Time: 03/01/20  2:00 PM  Result Value Ref Range   Urine Total Volume-UPROT 3,500 mL   Collection Interval-UPROT 24 hours   Protein, Urine 6 mg/dL   Protein, 24H Urine 210 (H) 50 - 100 mg/day  Basic metabolic panel   Collection Time: 03/02/20 10:14 AM  Result Value Ref Range   Sodium 130 (L) 135 - 145 mmol/L   Potassium 3.8 3.5 - 5.1 mmol/L   Chloride 100 98 - 111 mmol/L   CO2 24 22 - 32 mmol/L   Glucose, Bld 166 (H) 70 - 99 mg/dL   BUN 5 (L) 6 - 20 mg/dL   Creatinine, Ser 0.76 0.44 - 1.00 mg/dL   Calcium 8.5 (L) 8.9 - 10.3 mg/dL   GFR calc non Af Amer >60 >60 mL/min   GFR calc Af Amer >60 >60 mL/min   Anion gap 6 5 - 15  Results for orders placed or performed in visit on 02/29/20 (from the past 168 hour(s))  GC/Chlamydia probe amp ([place])not at [facility]   Collection Time: 02/29/20  1:44 PM  Result Value Ref Range   Neisseria Gonorrhea Negative    Chlamydia Negative    Comment Normal Reference [place] Chlamydia - Negative    Comment      Normal Reference Range Neisseria Gonorrhea - Negative  POCT urinalysis dip (device)   Collection Time: 02/29/20  2:09 PM  Result Value Ref Range   Glucose, UA 500 (A) NEGATIVE mg/dL   Bilirubin Urine NEGATIVE NEGATIVE   Ketones, ur 15 (A) NEGATIVE mg/dL   Specific Gravity, Urine <=1.005 1.005 - 1.030   Hgb urine dipstick NEGATIVE NEGATIVE   pH 5.5 5.0 -  8.0   Protein, ur NEGATIVE NEGATIVE mg/dL   Urobilinogen, UA 0.2 0.0 - 1.0 mg/dL   Nitrite NEGATIVE NEGATIVE   Leukocytes,Ua NEGATIVE NEGATIVE  Protein / creatinine ratio, urine   Collection Time: 02/29/20  2:23 PM  Result Value Ref Range   Creatinine, Urine 15.0 Not Estab. mg/dL   Protein, Ur 9.4 Not Estab. mg/dL   Protein/Creat Ratio 627 (H) 0 - 200 mg/g creat  Hemoglobin A1c   Collection Time: 02/29/20  2:26 PM  Result Value Ref Range   Hgb A1c MFr Bld 13.9 (H) 4.8 - 5.6 %  Est. average glucose Bld gHb Est-mCnc 352 mg/dL  CBC/D/Plt+RPR+Rh+ABO+Rub Ab...   Collection Time: 02/29/20  2:26 PM  Result Value Ref Range   Hepatitis B Surface Ag Negative Negative   HCV Ab <0.1 0.0 - 0.9 s/co ratio   RPR Ser Ql Non Reactive Non Reactive   Rubella Antibodies, IGG <0.90 (L) Immune >0.99 index   ABO Grouping A    Rh Factor Positive    Antibody Screen Negative Negative   HIV Screen 4th Generation wRfx Non Reactive Non Reactive   WBC 5.7 3.4 - 10.8 x10E3/uL   RBC 4.57 3.77 - 5.28 x10E6/uL   Hemoglobin 12.3 11.1 - 15.9 g/dL   Hematocrit 37.9 34.0 - 46.6 %   MCV 83 79 - 97 fL   MCH 26.9 26.6 - 33.0 pg   MCHC 32.5 31 - 35 g/dL   RDW 14.6 11.7 - 15.4 %   Platelets 267 150 - 450 x10E3/uL   Neutrophils 60 Not Estab. %   Lymphs 33 Not Estab. %   Monocytes 5 Not Estab. %   Eos 1 Not Estab. %   Basos 1 Not Estab. %   Neutrophils Absolute 3.5 1 - 7 x10E3/uL   Lymphocytes Absolute 1.9 0 - 3 x10E3/uL   Monocytes Absolute 0.3 0 - 0 x10E3/uL   EOS (ABSOLUTE) 0.0 0.0 - 0.4 x10E3/uL   Basophils Absolute 0.0 0 - 0 x10E3/uL   Immature Granulocytes 0 Not Estab. %   Immature Grans (Abs) 0.0 0.0 - 0.1 x10E3/uL  Comprehensive metabolic panel   Collection Time: 02/29/20  2:26 PM  Result Value Ref Range   Glucose 624 (HH) 65 - 99 mg/dL   BUN 7 6 - 20 mg/dL   Creatinine, Ser 0.77 0.57 - 1.00 mg/dL   GFR calc non Af Amer 102 >59 mL/min/1.73   GFR calc Af Amer 117 >59 mL/min/1.73   BUN/Creatinine  Ratio 9 9 - 23   Sodium 124 (L) 134 - 144 mmol/L   Potassium 4.2 3.5 - 5.2 mmol/L   Chloride 91 (L) 96 - 106 mmol/L   CO2 18 (L) 20 - 29 mmol/L   Calcium 8.5 (L) 8.7 - 10.2 mg/dL   Total Protein 6.3 6.0 - 8.5 g/dL   Albumin 3.9 3.8 - 4.8 g/dL   Globulin, Total 2.4 1.5 - 4.5 g/dL   Albumin/Globulin Ratio 1.6 1.2 - 2.2   Bilirubin Total <0.2 0.0 - 1.2 mg/dL   Alkaline Phosphatase 82 48 - 121 IU/L   AST 11 0 - 40 IU/L   ALT 9 0 - 32 IU/L  TSH   Collection Time: 02/29/20  2:26 PM  Result Value Ref Range   TSH 1.290 0.450 - 4.500 uIU/mL  Interpretation:   Collection Time: 02/29/20  2:26 PM  Result Value Ref Range   HCV Interp 1: Comment   Results for orders placed or performed during the hospital encounter of 02/22/20 (from the past 168 hour(s))  CBG monitoring, ED   Collection Time: 03/02/20 10:00 AM  Result Value Ref Range   CBG 159    US OB LESS THAN 14 WEEKS WITH OB TRANSVAGINAL  Result Date: 02/21/2020 CLINICAL DATA:  First trimester pregnancy, uncertain LMP, assessment of dating and viability EXAM: OBSTETRIC <14 WK [place] AND TRANSVAGINAL OB US TECHNIQUE: Both transabdominal and transvaginal ultrasound examinations were performed for complete evaluation of the gestation as well as the maternal uterus, adnexal regions, and pelvic cul-de-sac. Transvaginal technique was performed to assess early pregnancy. COMPARISON:  01/25/2020 FINDINGS: Intrauterine gestational sac: Present, single Yolk sac:  Present Embryo:  Present Cardiac Activity: Present Heart Rate: 164 bpm CRL:  225 22.4 mm   8 w   6 d                  [place] EDC: 09/26/2020 Subchorionic hemorrhage:  None visualized. Maternal uterus/adnexae: Subserosal leiomyoma posterior LEFT uterus 3.5 x 2.3 x 2.3 cm. Additional submucosal leiomyoma anterior RIGHT uterus 2.3 x 2.3 x 1.2 cm. RIGHT ovary measures 2.1 x 2.3 x 2.3 cm and contains a small corpus luteum. LEFT ovary not visualized, likely obscured by bowel loops. No free pelvic fluid or  adnexal masses. IMPRESSION: Single live intrauterine gestation at 8 weeks 6 days EGA. Nonvisualization of LEFT ovary. Two uterine leiomyomata identified as above. Electronically Signed   By: Lavonia Dana M.D.   On: 02/21/2020 09:18    Future Appointments  Date Time Provider [place]  03/05/2020  9:15 AM [facility]  03/15/2020  8:15 AM Aletha Halim, MD [facility]  03/21/2020 10:15 AM [facility] NURSE [facility]  03/21/2020 10:30 AM [facility] US3 [facility]    Discharge Condition: Stable  Discharge disposition: 01-Home or Self Care       Discharge Instructions    Discharge activity:  No Restrictions   Complete by: As directed    Discharge diet:   Complete by: As directed    OB/Carb modified   No sexual activity restrictions   Complete by: As directed      Allergies as of 03/04/2020   No Known Allergies     Medication List    STOP taking these medications   insulin NPH Human 100 UNIT/ML injection Commonly known as: NOVOLIN N     TAKE these medications   Accu-Chek Guide test strip Generic drug: glucose blood Use as instructed QID   Accu-Chek Softclix Lancets lancets Use as instructed QID   albuterol 108 (90 Base) MCG/ACT inhaler Commonly known as: VENTOLIN HFA Inhale 2 puffs into the lungs every 6 (six) hours as needed for wheezing or shortness of breath.   clotrimazole 1 % vaginal cream Commonly known as: GYNE-LOTRIMIN Place 1 Applicatorful vaginally at bedtime.   FreeStyle Libre 14 Day Reader Kerrin Mo 1 Device by Does not apply route as needed.   FreeStyle Libre 14 Day Sensor Misc 1 Device by Does not apply route as needed.   insulin aspart 100 UNIT/ML injection Commonly known as: novoLOG Inject 22 Units into the skin 3 (three) times daily before meals. What changed: how much to take   Levemir FlexTouch 100 UNIT/ML FlexPen Generic drug: insulin detemir Inject 28 Units into the skin 2 (two) times daily.   multivitamin-prenatal  27-0.8 MG Tabs tablet Take 1 tablet by mouth daily at 12 noon.   promethazine 25 MG tablet Commonly known as: PHENERGAN Take 1 tablet (25 mg total) by mouth every 6 (six) hours as needed for nausea or vomiting.   TRUEplus Pen Needles 31G X 6 MM Misc Generic drug: Insulin Pen Needle Use to inject Levemir BID.       Follow-up [place] [facility] Follow up.   Specialty: Obstetrics and Gynecology Contact information: [place] [id number] [phone]              Signed: Donnamae Jude M.D. 03/04/2020, 10:03 AM

## 2020-03-04 NOTE — Progress Notes (Signed)
Inpatient Diabetes Program Recommendations  AACE/ADA: New Consensus Statement on Inpatient Glycemic Control (2015)  Target Ranges:  Prepandial:   less than 140 mg/dL      Peak postprandial:   less than 180 mg/dL (1-2 hours)      Critically ill patients:  140 - 180 mg/dL   Lab Results  Component Value Date   GLUCAP 259 (H) 03/01/2020   HGBA1C 13.9 (H) 02/29/2020     Diabetes history:Type 2 DM Outpatient Diabetes medications:Novolog 15 units TID, NPH 39 units BID Current orders for Inpatient glycemic control:Levemir 26 units BID, Novolog 22 units TID, Novolog 0-24 units TID  Inpatient Diabetes Program Recommendations:  Consider increasing Levemir to 28 units BID.  Noted yesterday's CBG of 293 mg/dL. Meal coverage was held for one hour following the meal and intervention for hypoglycemia. Assuming patient was overcorrected and had delayed response to insulin.   Spoke with patient regarding event. Did not experience symptoms. Used experienced as opportunity for learning. Encouraged patient that if in the 80 mg/dL and near meal time to go ahead and eat meal vs hypoglycemia interventions. Expressed understanding and has no further questions at this time.  Thanks, Bronson Curb, MSN, RNC-OB Diabetes Coordinator [phone] (8a-5p)

## 2020-03-05 ENCOUNTER — Ambulatory Visit: Payer: Medicaid Other | Admitting: Registered"

## 2020-03-05 ENCOUNTER — Other Ambulatory Visit: Payer: Self-pay

## 2020-03-05 DIAGNOSIS — O24119 Pre-existing diabetes mellitus, type 2, in pregnancy, unspecified trimester: Secondary | ICD-10-CM

## 2020-03-05 LAB — CULTURE, OB URINE

## 2020-03-05 LAB — URINE CULTURE, OB REFLEX

## 2020-03-05 NOTE — Progress Notes (Signed)
Patient was seen on 03/05/20 for Type 2 Diabetes in Pregnancy self-management. EDD 09/26/20. Patient states no history of GDM. Diet history obtained. Patient eats variety of all food groups. Beverages include zero sugar beverages, 64 oz water daily.  Patient reports prior diabetes education in doctor's office and in hospital. Pt states her sister has diabetes and has been helping her with carb counting and other aspects of diabetes care.  Patient was provided an [employer] and contact information for the Omnipod rep.   The following learning objectives were met by the patient :   States the definition of Type 2 Diabetes vs Gestational Diabetes  States why dietary management is important in controlling blood glucose  Describes the effects of carbohydrates on blood glucose levels  Demonstrates ability to create a balanced meal plan  Demonstrates carbohydrate counting   States when to check blood glucose levels  Demonstrates proper blood glucose monitoring techniques  States the effect of stress and exercise on blood glucose levels  States the importance of limiting caffeine and abstaining from alcohol and smoking  States how to use rule of 15 to treat low blood sugar  Plan:  Aim for 3 Carbohydrate Choices per meal (45 grams) +/- 1 either way  Aim for 1-2 Carbohydrate Choices per snack Begin reading food labels for Total Carbohydrate of foods If OK with your MD, consider  increasing your activity level by walking, Arm Chair Exercises or other activity daily as tolerated Begin checking Blood Glucose before breakfast and 2 hours after first bite of breakfast, lunch and dinner as directed by MD  Bring Log Book/Sheet and meter to every medical appointment  Baby Scripts: MD asked patient not to use Baby Scripts due to frequent notifications during uncontrolled period while getting insulin Rx adjusted. Contact Omnipod Rep to start the process of getting started on this method of insulin  delivery.  Patient already has [employer] and is testing frequently. Pt reported readings FBS 106-137 mg/dL PPBG: low 100s-180, avg 160 mg/dL  Based on these limited data, RD believes she is at or close to ADA recommended control goal of an A1c 6.5%  Pt advised to set low blood sugar alarm on Libre for 70 mg/dL and do CBG test and treat with rule of 15.  Due to insulin use, patient instructed to monitor glucose levels: Fasting: 70-95 mg/dL; 2 hours after each meal: 100-120 mg/dL  Patient received the following handouts:  Pre-existing Diabetes and Pregnancy  Carbohydrate Counting List  Patient will be seen for follow-up as needed.

## 2020-03-07 ENCOUNTER — Encounter: Payer: Self-pay | Admitting: *Deleted

## 2020-03-08 ENCOUNTER — Encounter: Payer: Self-pay | Admitting: Obstetrics and Gynecology

## 2020-03-08 DIAGNOSIS — R8271 Bacteriuria: Secondary | ICD-10-CM | POA: Insufficient documentation

## 2020-03-08 MED ORDER — CEPHALEXIN 500 MG PO CAPS
500.0000 mg | ORAL_CAPSULE | Freq: Four times a day (QID) | ORAL | 0 refills | Status: AC
Start: 2020-03-08 — End: 2020-03-13

## 2020-03-08 NOTE — Addendum Note (Signed)
Addended by: Aletha Halim on: 03/08/2020 10:36 AM   Modules accepted: Orders

## 2020-03-13 ENCOUNTER — Telehealth (INDEPENDENT_AMBULATORY_CARE_PROVIDER_SITE_OTHER): Payer: Medicaid Other | Admitting: Lactation Services

## 2020-03-13 ENCOUNTER — Telehealth: Payer: Self-pay | Admitting: Lactation Services

## 2020-03-13 DIAGNOSIS — O0990 Supervision of high risk pregnancy, unspecified, unspecified trimester: Secondary | ICD-10-CM

## 2020-03-13 DIAGNOSIS — O24111 Pre-existing diabetes mellitus, type 2, in pregnancy, first trimester: Secondary | ICD-10-CM

## 2020-03-13 NOTE — Telephone Encounter (Signed)
Omnipod orders signed and faxed to Irven Baltimore, Omnipod Rep.

## 2020-03-13 NOTE — Telephone Encounter (Signed)
Called patient to give results of [facility] Genetic Screening.   Patient Silent Carrier for Alpha Thalassemia and Carrier for Sickle Cell Disease.   Patient did not answer. LM for patient to call the office. My Chart message sent.

## 2020-03-14 ENCOUNTER — Encounter: Payer: Self-pay | Admitting: Lactation Services

## 2020-03-14 ENCOUNTER — Other Ambulatory Visit: Payer: Self-pay | Admitting: Lactation Services

## 2020-03-14 MED ORDER — OMNIPOD CLASSIC PODS (GEN 3) MISC: 1.0000 | 6 refills | Status: DC

## 2020-03-14 MED ORDER — "SAFETY INSULIN SYRINGES 30G X 1/2"" 1 ML MISC": 1.0000 | Freq: Four times a day (QID) | 1 refills | Status: DC

## 2020-03-14 NOTE — Progress Notes (Signed)
Omnipods ordered as patient to meet with Diabetes Education next week. Insulin syringes ordered as patient out.

## 2020-03-14 NOTE — Telephone Encounter (Signed)
Opened in error

## 2020-03-15 ENCOUNTER — Ambulatory Visit (INDEPENDENT_AMBULATORY_CARE_PROVIDER_SITE_OTHER): Payer: Medicaid Other | Admitting: Obstetrics and Gynecology

## 2020-03-15 ENCOUNTER — Other Ambulatory Visit: Payer: Self-pay

## 2020-03-15 VITALS — BP 114/80 | HR 82 | Wt 257.0 lb

## 2020-03-15 DIAGNOSIS — Z3A12 12 weeks gestation of pregnancy: Secondary | ICD-10-CM

## 2020-03-15 DIAGNOSIS — J4520 Mild intermittent asthma, uncomplicated: Secondary | ICD-10-CM

## 2020-03-15 DIAGNOSIS — O99210 Obesity complicating pregnancy, unspecified trimester: Secondary | ICD-10-CM

## 2020-03-15 DIAGNOSIS — O1211 Gestational proteinuria, first trimester: Secondary | ICD-10-CM

## 2020-03-15 DIAGNOSIS — O24111 Pre-existing diabetes mellitus, type 2, in pregnancy, first trimester: Secondary | ICD-10-CM

## 2020-03-15 DIAGNOSIS — O34219 Maternal care for unspecified type scar from previous cesarean delivery: Secondary | ICD-10-CM

## 2020-03-15 DIAGNOSIS — R8271 Bacteriuria: Secondary | ICD-10-CM

## 2020-03-15 DIAGNOSIS — D573 Sickle-cell trait: Secondary | ICD-10-CM

## 2020-03-15 DIAGNOSIS — O0990 Supervision of high risk pregnancy, unspecified, unspecified trimester: Secondary | ICD-10-CM

## 2020-03-15 DIAGNOSIS — O10919 Unspecified pre-existing hypertension complicating pregnancy, unspecified trimester: Secondary | ICD-10-CM

## 2020-03-15 DIAGNOSIS — Z6841 Body Mass Index (BMI) 40.0 and over, adult: Secondary | ICD-10-CM

## 2020-03-15 DIAGNOSIS — E871 Hypo-osmolality and hyponatremia: Secondary | ICD-10-CM

## 2020-03-15 LAB — POCT URINALYSIS DIP (DEVICE)
Bilirubin Urine: NEGATIVE
Glucose, UA: 250 mg/dL — AB
Hgb urine dipstick: NEGATIVE
Ketones, ur: NEGATIVE mg/dL
Leukocytes,Ua: NEGATIVE
Nitrite: NEGATIVE
Protein, ur: NEGATIVE mg/dL
Specific Gravity, Urine: >=1.03 (ref 1.005–1.030)
Urobilinogen, UA: 0.2 mg/dL (ref 0.0–1.0)
pH: 6.5 (ref 5.0–8.0)

## 2020-03-15 MED ORDER — LEVEMIR FLEXTOUCH 100 UNIT/ML ~~LOC~~ SOPN: PEN_INJECTOR | SUBCUTANEOUS | 11 refills | Status: DC

## 2020-03-15 NOTE — Progress Notes (Signed)
Prenatal Visit Note Date: 03/15/2020 Clinic: [facility] for [facility] [place]  Subjective:  [name] is a 34 y.o. [id number] at [redacted]w[redacted]d being seen today for ongoing prenatal care.  She is currently monitored for the following issues for this high-risk pregnancy and has Sickle cell trait ([place]); Obesity in pregnancy; BMI 40.0-44.9, adult ([place]); Asthma, mild intermittent; History of cesarean delivery, currently pregnant; Uterine fibroid in pregnancy; Family history of diabetes mellitus in mother; Paresthesia of both feet; Visual field scotoma of both eyes; Other headache syndrome; Anxiety; Supervision of high risk pregnancy, antepartum; Chronic hypertension affecting pregnancy; Type 2 diabetes mellitus in pregnancy; Uncontrolled diabetes mellitus ([place]); Proteinuria affecting pregnancy; Hyponatremia; and GBS bacteriuria on their problem list.  Patient reports having some sugars low with morning fasting.   Contractions: Not present. Vag. Bleeding: None.  Movement: Absent. Denies leaking of fluid.   The following portions of the patient's history were reviewed and updated as appropriate: allergies, current medications, past family history, past medical history, past social history, past surgical history and problem list. Problem list updated.  Objective:   Vitals:   03/15/20 0821  BP: 114/80  Pulse: 82  Weight: 257 lb (116.6 kg)    Fetal Status: Fetal Heart Rate (bpm): 162   Movement: Absent     General:  Alert, oriented and cooperative. Patient is in no acute distress.  Skin: Skin is warm and dry. No rash noted.   Cardiovascular: Normal heart rate noted  Respiratory: Normal respiratory effort, no problems with respiration noted  Abdomen: Soft, gravid, appropriate for gestational age. Pain/Pressure: Absent     Pelvic:  Cervical exam deferred        Extremities: Normal range of motion.  Edema: None  Mental Status: Normal mood and affect. Normal behavior. Normal judgment and thought  content.   Urinalysis:      Assessment and Plan:  Pregnancy: [id number] at [redacted]w[redacted]d  1. Supervision of high risk pregnancy, antepartum Routine care. Follow up NT next week. cffdna still pending.  - Basic metabolic panel - Ambulatory referral to Ophthalmology  2. Proteinuria affecting pregnancy in first trimester Normal 24h urine while inpatient  3. Hyponatremia Asymptomatic. Follow up today - Basic metabolic panel  4. Chronic hypertension affecting pregnancy Doing well on no meds. Start low dose asa next visit  5. Mild intermittent asthma without complication Doing well on albuterol prn  6. Pregnancy with type 2 diabetes mellitus in first trimester Patient has skin CBG meter in place and omnipod faxed on 8/11. Patient congratulated on improved CBGs  Patient currently on levemir 28 with breakfast and 28 qhs and on aspart 22 with meals. AM fasting in the 60s-70s and patient notices. 2h postprandials in the 120s and 130s mostly. Recommend leaving everything as is except go down on qhs levemir to 24. Pt thought she was to see angela next week but nothing in the system. Number given to call her to see if she is to await to hear from her once the omnipod is in.   Referral made to optho  7. Sickle cell trait ([place]) Needs toc ucx late sept  8. Obesity in pregnancy  9. BMI 40.0-44.9, adult ([place])  10. GBS bacteriuria  11. History of cesarean delivery, currently pregnant  Preterm labor symptoms and general obstetric precautions including but not limited to vaginal bleeding, contractions, leaking of fluid and fetal movement were reviewed in detail with the patient. Please refer to After Visit Summary for other counseling recommendations.  Return in about 6 days (  around 03/21/2020) for same day as u/s, high risk, in person.   Aletha Halim, MD

## 2020-03-15 NOTE — Patient Instructions (Signed)
Compression stockings or TED hoses   Try for pressure of 10-17mmHg

## 2020-03-16 LAB — BASIC METABOLIC PANEL
BUN/Creatinine Ratio: 6 — ABNORMAL LOW (ref 9–23)
BUN: 4 mg/dL — ABNORMAL LOW (ref 6–20)
CO2: 23 mmol/L (ref 20–29)
Calcium: 8.9 mg/dL (ref 8.7–10.2)
Chloride: 99 mmol/L (ref 96–106)
Creatinine, Ser: 0.63 mg/dL (ref 0.57–1.00)
GFR calc Af Amer: 136 mL/min/{1.73_m2} (ref >59)
GFR calc non Af Amer: 118 mL/min/{1.73_m2} (ref >59)
Glucose: 165 mg/dL — ABNORMAL HIGH (ref 65–99)
Potassium: 4.2 mmol/L (ref 3.5–5.2)
Sodium: 134 mmol/L (ref 134–144)

## 2020-03-19 ENCOUNTER — Telehealth: Payer: Self-pay

## 2020-03-19 ENCOUNTER — Encounter: Payer: Self-pay | Admitting: Lactation Services

## 2020-03-19 ENCOUNTER — Other Ambulatory Visit: Payer: Self-pay | Admitting: Lactation Services

## 2020-03-19 MED ORDER — FREESTYLE LIBRE 2 READER DEVI: 1.0000 | Freq: Once | 0 refills | Status: AC

## 2020-03-19 MED ORDER — INSULIN ASPART 100 UNIT/ML ~~LOC~~ SOLN: SUBCUTANEOUS | 6 refills | Status: DC

## 2020-03-19 MED ORDER — FREESTYLE LIBRE 2 SENSOR MISC: 1.0000 | 8 refills | Status: DC

## 2020-03-19 NOTE — Progress Notes (Signed)
Insulin orders placed per Jeanie Sewer, RD

## 2020-03-19 NOTE — Progress Notes (Signed)
Changed Orders for Freestyle Libre to # 2 per Rayburn Go, Omnipod Rep. Reviewed with Levie Heritage, RD, Diabetes Educator.

## 2020-03-19 NOTE — Telephone Encounter (Signed)
Pt called Nurse line 03/18/20 @ 3:10p stating that her scanner is about to expire the day after tomorrow, she stated Celebrate 1. Tried to contact her but no answer, left VM for call back.

## 2020-03-20 ENCOUNTER — Encounter: Payer: Self-pay | Admitting: Family Medicine

## 2020-03-20 NOTE — Progress Notes (Signed)
Patient did not keep appointment today. She will be called to reschedule.  

## 2020-03-21 ENCOUNTER — Other Ambulatory Visit: Payer: Self-pay | Admitting: *Deleted

## 2020-03-21 ENCOUNTER — Other Ambulatory Visit: Payer: Self-pay

## 2020-03-21 ENCOUNTER — Encounter: Payer: Self-pay | Admitting: *Deleted

## 2020-03-21 ENCOUNTER — Ambulatory Visit: Payer: Medicaid Other

## 2020-03-21 ENCOUNTER — Ambulatory Visit: Payer: Medicaid Other | Attending: Obstetrics and Gynecology

## 2020-03-21 ENCOUNTER — Ambulatory Visit: Payer: Medicaid Other | Admitting: *Deleted

## 2020-03-21 ENCOUNTER — Encounter: Payer: Self-pay | Admitting: General Practice

## 2020-03-21 DIAGNOSIS — O34219 Maternal care for unspecified type scar from previous cesarean delivery: Secondary | ICD-10-CM

## 2020-03-21 DIAGNOSIS — O10011 Pre-existing essential hypertension complicating pregnancy, first trimester: Secondary | ICD-10-CM

## 2020-03-21 DIAGNOSIS — Z3A13 13 weeks gestation of pregnancy: Secondary | ICD-10-CM

## 2020-03-21 DIAGNOSIS — O24111 Pre-existing diabetes mellitus, type 2, in pregnancy, first trimester: Secondary | ICD-10-CM

## 2020-03-21 DIAGNOSIS — O10919 Unspecified pre-existing hypertension complicating pregnancy, unspecified trimester: Secondary | ICD-10-CM

## 2020-03-21 DIAGNOSIS — O99211 Obesity complicating pregnancy, first trimester: Secondary | ICD-10-CM | POA: Diagnosis not present

## 2020-03-21 DIAGNOSIS — O99891 Other specified diseases and conditions complicating pregnancy: Secondary | ICD-10-CM

## 2020-03-21 DIAGNOSIS — J45909 Unspecified asthma, uncomplicated: Secondary | ICD-10-CM

## 2020-03-21 DIAGNOSIS — O0990 Supervision of high risk pregnancy, unspecified, unspecified trimester: Secondary | ICD-10-CM | POA: Insufficient documentation

## 2020-03-21 DIAGNOSIS — O1211 Gestational proteinuria, first trimester: Secondary | ICD-10-CM

## 2020-03-21 DIAGNOSIS — E669 Obesity, unspecified: Secondary | ICD-10-CM | POA: Diagnosis not present

## 2020-03-21 DIAGNOSIS — Z862 Personal history of diseases of the blood and blood-forming organs and certain disorders involving the immune mechanism: Secondary | ICD-10-CM

## 2020-03-21 DIAGNOSIS — O3411 Maternal care for benign tumor of corpus uteri, first trimester: Secondary | ICD-10-CM

## 2020-03-22 ENCOUNTER — Encounter: Payer: Self-pay | Admitting: Obstetrics and Gynecology

## 2020-03-22 DIAGNOSIS — D563 Thalassemia minor: Secondary | ICD-10-CM | POA: Insufficient documentation

## 2020-03-22 NOTE — Telephone Encounter (Signed)
Called patient and LVM to call the office back to clarify some requested information.

## 2020-03-25 ENCOUNTER — Encounter: Payer: Self-pay | Admitting: Lactation Services

## 2020-03-26 ENCOUNTER — Telehealth: Payer: Self-pay | Admitting: Lactation Services

## 2020-03-26 NOTE — Telephone Encounter (Signed)
PA sent to Pharmacy for [facility].

## 2020-04-03 ENCOUNTER — Ambulatory Visit (INDEPENDENT_AMBULATORY_CARE_PROVIDER_SITE_OTHER): Payer: Medicaid Other | Admitting: Obstetrics & Gynecology

## 2020-04-03 ENCOUNTER — Encounter: Payer: Self-pay | Admitting: Obstetrics & Gynecology

## 2020-04-03 ENCOUNTER — Other Ambulatory Visit: Payer: Self-pay

## 2020-04-03 VITALS — BP 108/75 | HR 85 | Wt 259.6 lb

## 2020-04-03 DIAGNOSIS — O99210 Obesity complicating pregnancy, unspecified trimester: Secondary | ICD-10-CM

## 2020-04-03 DIAGNOSIS — O0990 Supervision of high risk pregnancy, unspecified, unspecified trimester: Secondary | ICD-10-CM

## 2020-04-03 DIAGNOSIS — O24112 Pre-existing diabetes mellitus, type 2, in pregnancy, second trimester: Secondary | ICD-10-CM

## 2020-04-03 NOTE — Progress Notes (Signed)
   PRENATAL VISIT NOTE  Subjective:  [name] is a 34 y.o. [phone] at [redacted]w[redacted]d being seen today for ongoing prenatal care.  She is currently monitored for the following issues for this high-risk pregnancy and has Sickle cell trait ([place]); Obesity in pregnancy; BMI 40.0-44.9, adult ([place]); Asthma, mild intermittent; History of cesarean delivery, currently pregnant; Uterine fibroid in pregnancy; Family history of diabetes mellitus in mother; Paresthesia of both feet; Visual field scotoma of both eyes; Other headache syndrome; Anxiety; Supervision of high risk pregnancy, antepartum; Chronic hypertension affecting pregnancy; Type 2 diabetes mellitus in pregnancy; Uncontrolled diabetes mellitus ([place]); Proteinuria affecting pregnancy; GBS bacteriuria; and Alpha thalassemia silent carrier on their problem list.  Patient reports no complaints.  Contractions: Not present. Vag. Bleeding: None.  Movement: Absent. Denies leaking of fluid.   The following portions of the patient's history were reviewed and updated as appropriate: allergies, current medications, past family history, past medical history, past social history, past surgical history and problem list.   Objective:   Vitals:   04/03/20 1046  BP: 108/75  Pulse: 85  Weight: 259 lb 9.6 oz (117.8 kg)    Fetal Status: Fetal Heart Rate (bpm): 154   Movement: Absent     General:  Alert, oriented and cooperative. Patient is in no acute distress.  Skin: Skin is warm and dry. No rash noted.   Cardiovascular: Normal heart rate noted  Respiratory: Normal respiratory effort, no problems with respiration noted  Abdomen: Soft, gravid, appropriate for gestational age.  Pain/Pressure: Present     Pelvic: Cervical exam deferred        Extremities: Normal range of motion.  Edema: None  Mental Status: Normal mood and affect. Normal behavior. Normal judgment and thought content.   Assessment and Plan:  Pregnancy: [id number] at [redacted]w[redacted]d 1. Supervision of high  risk pregnancy, antepartum [place] f/u 18 weeks  2. Obesity in pregnancy Body mass index is 39.47 kg/m.   3. Pregnancy with type 2 diabetes mellitus in second trimester FBS most in range as are PP using omnipod - US Fetal Echocardiography; Future  Preterm labor symptoms and general obstetric precautions including but not limited to vaginal bleeding, contractions, leaking of fluid and fetal movement were reviewed in detail with the patient. Please refer to After Visit Summary for other counseling recommendations.   Return in about 4 weeks (around 05/01/2020).  Future Appointments  Date Time Provider [place]  05/01/2020 10:15 AM Woodroe Mode, MD [facility]  05/03/2020 10:15 AM [facility] NURSE [facility]  05/03/2020 10:15 AM [facility] US2 [facility]    Emeterio Reeve, MD

## 2020-04-03 NOTE — Patient Instructions (Signed)

## 2020-04-09 ENCOUNTER — Telehealth: Payer: Self-pay | Admitting: Registered"

## 2020-04-09 NOTE — Telephone Encounter (Signed)
Bev Paddock, [place], provided documentation for final training follow-up   Date: 04/08/2020 Patient name: [name] DOB: 1986-05-12  I met with patient via phone/text to discuss their progress since starting on DASH OmniPod insulin delivery system on 04/08/2020  Patient states they are comfortable using the Omnipod system and they have successfully changed the pods out several times. They report no more history of hypoglycemia since I lowered her Basal Rate to 1.5 last Friday, 3 days ago.  Blood sugar history reveals FBG running typically under 100 mg/dL and post meal blood sugars @ under 140 md/dl with most under 120 mg/dl.  Current Settings:      Basal rate: 1.5 units /hour from 12:00 am to 12:00 am for TDD from Basal @ 36 units      Bolus is set up as pre set blouses@: 12 units/meal and 6 units/snack for TDD Bolus @ 48  TDD (Total Daily Dose) is currently 84 / day x 30 days = 2520/month or 3 vials per month. Please evaluate insulin Rx as needed as insulin doses typically increase.  Patient has completed the first 2 weeks since starting on the OmniPod DASH and will be followed by MD's going forward.  Harvie Bridge, RD, CDE Omnipod DASH Trainer

## 2020-04-23 ENCOUNTER — Telehealth: Payer: Self-pay | Admitting: Lactation Services

## 2020-04-23 NOTE — Telephone Encounter (Signed)
Patient called and LM that she is out of Novolog and needed to know what to do. Per Chart review. Patient should have enough. Attempted to call patient yesterday and sent My Chart Message.   Called patient and she did not answer. LM for patient to call the office or send a My Chart message if she still has concerns.

## 2020-05-01 ENCOUNTER — Ambulatory Visit (INDEPENDENT_AMBULATORY_CARE_PROVIDER_SITE_OTHER): Payer: Medicaid Other | Admitting: Obstetrics & Gynecology

## 2020-05-01 ENCOUNTER — Other Ambulatory Visit: Payer: Self-pay

## 2020-05-01 VITALS — BP 110/76 | HR 92 | Wt 267.8 lb

## 2020-05-01 DIAGNOSIS — O24112 Pre-existing diabetes mellitus, type 2, in pregnancy, second trimester: Secondary | ICD-10-CM

## 2020-05-01 DIAGNOSIS — O99210 Obesity complicating pregnancy, unspecified trimester: Secondary | ICD-10-CM

## 2020-05-01 DIAGNOSIS — O0990 Supervision of high risk pregnancy, unspecified, unspecified trimester: Secondary | ICD-10-CM

## 2020-05-01 MED ORDER — INSULIN ASPART 100 UNIT/ML ~~LOC~~ SOLN: SUBCUTANEOUS | 6 refills | Status: DC

## 2020-05-01 NOTE — Progress Notes (Signed)
   PRENATAL VISIT NOTE  Subjective:  [name] is a 34 y.o. [phone] at [redacted]w[redacted]d being seen today for ongoing prenatal care.  She is currently monitored for the following issues for this high-risk pregnancy and has Sickle cell trait ([place]); Obesity in pregnancy; BMI 40.0-44.9, adult ([place]); Asthma, mild intermittent; History of cesarean delivery, currently pregnant; Uterine fibroid in pregnancy; Family history of diabetes mellitus in mother; Paresthesia of both feet; Visual field scotoma of both eyes; Other headache syndrome; Anxiety; Supervision of high risk pregnancy, antepartum; Chronic hypertension affecting pregnancy; Type 2 diabetes mellitus in pregnancy; Uncontrolled diabetes mellitus ([place]); Proteinuria affecting pregnancy; GBS bacteriuria; and Alpha thalassemia silent carrier on their problem list.  Patient reports no complaints.  Contractions: Not present. Vag. Bleeding: None.  Movement: Present. Denies leaking of fluid.   The following portions of the patient's history were reviewed and updated as appropriate: allergies, current medications, past family history, past medical history, past social history, past surgical history and problem list.   Objective:   Vitals:   05/01/20 1034  BP: 110/76  Pulse: 92  Weight: 267 lb 12.8 oz (121.5 kg)    Fetal Status: Fetal Heart Rate (bpm): 154 Fundal Height: 20 cm Movement: Present     General:  Alert, oriented and cooperative. Patient is in no acute distress.  Skin: Skin is warm and dry. No rash noted.   Cardiovascular: Normal heart rate noted  Respiratory: Normal respiratory effort, no problems with respiration noted  Abdomen: Soft, gravid, appropriate for gestational age.  Pain/Pressure: Present     Pelvic: Cervical exam deferred        Extremities: Normal range of motion.  Edema: None  Mental Status: Normal mood and affect. Normal behavior. Normal judgment and thought content.   Assessment and Plan:  Pregnancy: [id number] at  [redacted]w[redacted]d 1. Supervision of high risk pregnancy, antepartum  - AFP, Serum, Open Spina Bifida - insulin aspart (NOVOLOG) 100 UNIT/ML injection; For use on Omnipod with continuous dosing. Basal rate 1.8 units per hour. Bolus of 11 units per meal and 5 units for snacks.  Dispense: 40 mL; Refill: 6 - US Fetal Echocardiography; Future  2. Pregnancy with type 2 diabetes mellitus in second trimester BS via omnipod still with values <80 and >150 up to 240, management per nurse coordinator  3. Obesity in pregnancy   Preterm labor symptoms and general obstetric precautions including but not limited to vaginal bleeding, contractions, leaking of fluid and fetal movement were reviewed in detail with the patient. Please refer to After Visit Summary for other counseling recommendations.   Return in about 3 weeks (around 05/22/2020).  Future Appointments  Date Time Provider [place]  05/03/2020 10:15 AM [facility] NURSE [facility]  05/03/2020 10:15 AM [facility] US2 [facility]    Emeterio Reeve, MD

## 2020-05-02 LAB — AFP, SERUM, OPEN SPINA BIFIDA
AFP MoM: 1.58
AFP Value: 46.9 ng/mL
Gest. Age on Collection Date: 18.6 weeks
Maternal Age At EDD: 33.6 yr
OSBR Risk 1 IN: 1320
Test Results:: NEGATIVE
Weight: 268 [lb_av]

## 2020-05-03 ENCOUNTER — Ambulatory Visit: Payer: Medicaid Other

## 2020-05-06 ENCOUNTER — Encounter: Payer: Self-pay | Admitting: *Deleted

## 2020-05-20 ENCOUNTER — Ambulatory Visit: Payer: Medicaid Other | Attending: Obstetrics

## 2020-05-20 ENCOUNTER — Encounter: Payer: Self-pay | Admitting: *Deleted

## 2020-05-20 ENCOUNTER — Other Ambulatory Visit: Payer: Self-pay

## 2020-05-20 ENCOUNTER — Ambulatory Visit: Payer: Medicaid Other | Admitting: *Deleted

## 2020-05-20 ENCOUNTER — Other Ambulatory Visit: Payer: Self-pay | Admitting: *Deleted

## 2020-05-20 DIAGNOSIS — O1212 Gestational proteinuria, second trimester: Secondary | ICD-10-CM

## 2020-05-20 DIAGNOSIS — O10919 Unspecified pre-existing hypertension complicating pregnancy, unspecified trimester: Secondary | ICD-10-CM | POA: Insufficient documentation

## 2020-05-20 DIAGNOSIS — E119 Type 2 diabetes mellitus without complications: Secondary | ICD-10-CM

## 2020-05-20 DIAGNOSIS — O99212 Obesity complicating pregnancy, second trimester: Secondary | ICD-10-CM | POA: Diagnosis not present

## 2020-05-20 DIAGNOSIS — E669 Obesity, unspecified: Secondary | ICD-10-CM

## 2020-05-20 DIAGNOSIS — O24112 Pre-existing diabetes mellitus, type 2, in pregnancy, second trimester: Secondary | ICD-10-CM

## 2020-05-20 DIAGNOSIS — O34219 Maternal care for unspecified type scar from previous cesarean delivery: Secondary | ICD-10-CM

## 2020-05-20 DIAGNOSIS — O0990 Supervision of high risk pregnancy, unspecified, unspecified trimester: Secondary | ICD-10-CM | POA: Diagnosis present

## 2020-05-20 DIAGNOSIS — Z3A21 21 weeks gestation of pregnancy: Secondary | ICD-10-CM

## 2020-05-20 DIAGNOSIS — Z148 Genetic carrier of other disease: Secondary | ICD-10-CM

## 2020-05-20 DIAGNOSIS — J45909 Unspecified asthma, uncomplicated: Secondary | ICD-10-CM

## 2020-05-20 DIAGNOSIS — O99512 Diseases of the respiratory system complicating pregnancy, second trimester: Secondary | ICD-10-CM

## 2020-05-20 DIAGNOSIS — O10012 Pre-existing essential hypertension complicating pregnancy, second trimester: Secondary | ICD-10-CM | POA: Diagnosis not present

## 2020-05-20 DIAGNOSIS — Z363 Encounter for antenatal screening for malformations: Secondary | ICD-10-CM

## 2020-05-22 ENCOUNTER — Ambulatory Visit (INDEPENDENT_AMBULATORY_CARE_PROVIDER_SITE_OTHER): Payer: Medicaid Other | Admitting: Obstetrics & Gynecology

## 2020-05-22 VITALS — BP 118/77 | HR 93 | Wt 270.6 lb

## 2020-05-22 DIAGNOSIS — O34219 Maternal care for unspecified type scar from previous cesarean delivery: Secondary | ICD-10-CM

## 2020-05-22 DIAGNOSIS — O10919 Unspecified pre-existing hypertension complicating pregnancy, unspecified trimester: Secondary | ICD-10-CM

## 2020-05-22 DIAGNOSIS — O24112 Pre-existing diabetes mellitus, type 2, in pregnancy, second trimester: Secondary | ICD-10-CM

## 2020-05-22 DIAGNOSIS — O0990 Supervision of high risk pregnancy, unspecified, unspecified trimester: Secondary | ICD-10-CM

## 2020-05-22 NOTE — Progress Notes (Signed)
   PRENATAL VISIT NOTE  Subjective:  [name] is a 34 y.o. [phone] at [redacted]w[redacted]d being seen today for ongoing prenatal care.  She is currently monitored for the following issues for this high-risk pregnancy and has Sickle cell trait ([place]); Obesity in pregnancy; BMI 40.0-44.9, adult ([place]); Asthma, mild intermittent; History of cesarean delivery, currently pregnant; Uterine fibroid in pregnancy; Family history of diabetes mellitus in mother; Paresthesia of both feet; Visual field scotoma of both eyes; Other headache syndrome; Anxiety; Supervision of high risk pregnancy, antepartum; Chronic hypertension affecting pregnancy; Type 2 diabetes mellitus in pregnancy; Uncontrolled diabetes mellitus ([place]); Proteinuria affecting pregnancy; GBS bacteriuria; and Alpha thalassemia silent carrier on their problem list.  Patient reports no complaints.  Contractions: Not present. Vag. Bleeding: None.  Movement: Present. Denies leaking of fluid.   The following portions of the patient's history were reviewed and updated as appropriate: allergies, current medications, past family history, past medical history, past social history, past surgical history and problem list.   Objective:   Vitals:   05/22/20 1356  BP: 118/77  Pulse: 93  Weight: 270 lb 9.6 oz (122.7 kg)    Fetal Status:     Movement: Present     General:  Alert, oriented and cooperative. Patient is in no acute distress.  Skin: Skin is warm and dry. No rash noted.   Cardiovascular: Normal heart rate noted  Respiratory: Normal respiratory effort, no problems with respiration noted  Abdomen: Soft, gravid, appropriate for gestational age.  Pain/Pressure: Present     Pelvic: Cervical exam deferred        Extremities: Normal range of motion.  Edema: None  Mental Status: Normal mood and affect. Normal behavior. Normal judgment and thought content.   Assessment and Plan:  Pregnancy: [id number] at [redacted]w[redacted]d 1. Pregnancy with type 2 diabetes mellitus in  second trimester Uses Omnipod, in contact with DM management for control, states control is improved  2. Chronic hypertension affecting pregnancy 118/77  3. History of cesarean delivery, currently pregnant Considering VBAC, has prevoius VD X2  4. Supervision of high risk pregnancy, antepartum Fetal echo in 2 weeks  Preterm labor symptoms and general obstetric precautions including but not limited to vaginal bleeding, contractions, leaking of fluid and fetal movement were reviewed in detail with the patient. Please refer to After Visit Summary for other counseling recommendations.   Return in about 4 weeks (around 06/19/2020) for HROB.  Future Appointments  Date Time Provider [place]  06/17/2020  8:30 AM [facility] NURSE [facility]  06/17/2020  8:45 AM [facility] US4 [facility] [place]    Emeterio Reeve, MD

## 2020-05-22 NOTE — Patient Instructions (Signed)
Vaginal Birth After Cesarean Delivery  Vaginal birth after cesarean delivery (VBAC) is giving birth vaginally after previously delivering a baby through a cesarean section (C-section). A VBAC may be a safe option for you, depending on your health and other factors. It is important to discuss VBAC with your health care provider early in your pregnancy so you can understand the risks, benefits, and options. Having these discussions early will give you time to make your birth plan. Who are the best candidates for VBAC? The best candidates for VBAC are women who:  Have had one or two prior cesarean deliveries, and the incision made during the delivery was horizontal (low transverse).  Do not have a vertical (classical) scar on their uterus.  Have not had a tear in the wall of their uterus (uterine rupture).  Plan to have more pregnancies. A VBAC is also more likely to be successful:  In women who have previously given birth vaginally.  When labor starts by itself (spontaneously) before the due date. What are the benefits of VBAC? The benefits of delivering your baby vaginally instead of by a cesarean delivery include:  A shorter hospital stay.  A faster recovery time.  Less pain.  Avoiding risks associated with major surgery, such as infection and blood clots.  Less blood loss and less need for donated blood (transfusions). What are the risks of VBAC? The main risk of attempting a VBAC is that it may fail, forcing your health care provider to deliver your baby by a C-section. Other risks are rare and include:  Tearing (rupture) of the scar from a past cesarean delivery.  Other risks associated with vaginal deliveries. If a repeat cesarean delivery is needed, the risks include:  Blood loss.  Infection.  Blood clot.  Damage to surrounding organs.  Removal of the uterus (hysterectomy), if it is damaged.  Placenta problems in future pregnancies. What else should I know  about my options? Delivering a baby through a VBAC is similar to having a normal spontaneous vaginal delivery. Therefore, it is safe:  To try with twins.  For your health care provider to try to turn the baby from a breech position (external cephalic version) during labor.  With epidural analgesia for pain relief. Consider where you would like to deliver your baby. VBAC should be attempted in facilities where an emergency cesarean delivery can be performed. VBAC is not recommended for home births. Any changes in your health or your baby's health during your pregnancy may make it necessary to change your initial decision about VBAC. Your health care provider may recommend that you do not attempt a VBAC if:  Your baby's suspected weight is 8.8 lb (4 kg) or more.  You have preeclampsia. This is a condition that causes high blood pressure along with other symptoms, such as swelling and headaches.  You will have VBAC less than 19 months after your cesarean delivery.  You are past your due date.  You need to have labor started (induced) because your cervix is not ready for labor (unfavorable). Where to find more information  American Pregnancy Association: americanpregnancy.org  [employer] of Obstetricians and Gynecologists: acog.org Summary  Vaginal birth after cesarean delivery (VBAC) is giving birth vaginally after previously delivering a baby through a cesarean section (C-section). A VBAC may be a safe option for you, depending on your health and other factors.  Discuss VBAC with your health care provider early in your pregnancy so you can understand the risks, benefits, options, and  have plenty of time to make your birth plan.  The main risk of attempting a VBAC is that it may fail, forcing your health care provider to deliver your baby by a C-section. Other risks are rare. This information is not intended to replace advice given to you by your health care provider. Make sure  you discuss any questions you have with your health care provider. Document Revised: 11/15/2018 Document Reviewed: 10/27/2016 Elsevier Patient Education  [place] of Pregnancy  The second trimester is from week 14 through week 27 (month 4 through 6). This is often the time in pregnancy that you feel your best. Often times, morning sickness has lessened or quit. You may have more energy, and you may get hungry more often. Your unborn baby is growing rapidly. At the end of the sixth month, he or she is about 9 inches long and weighs about 1 pounds. You will likely feel the baby move between 18 and 20 weeks of pregnancy. Follow these instructions at home: Medicines  Take over-the-counter and prescription medicines only as told by your doctor. Some medicines are safe and some medicines are not safe during pregnancy.  Take a prenatal vitamin that contains at least 600 micrograms (mcg) of folic acid.  If you have trouble pooping (constipation), take medicine that will make your stool soft (stool softener) if your doctor approves. Eating and drinking   Eat regular, healthy meals.  Avoid raw meat and uncooked cheese.  If you get low calcium from the food you eat, talk to your doctor about taking a daily calcium supplement.  Avoid foods that are high in fat and sugars, such as fried and sweet foods.  If you feel sick to your stomach (nauseous) or throw up (vomit): ? Eat 4 or 5 small meals a day instead of 3 large meals. ? Try eating a few soda crackers. ? Drink liquids between meals instead of during meals.  To prevent constipation: ? Eat foods that are high in fiber, like fresh fruits and vegetables, whole grains, and beans. ? Drink enough fluids to keep your pee (urine) clear or pale yellow. Activity  Exercise only as told by your doctor. Stop exercising if you start to have cramps.  Do not exercise if it is too hot, too humid, or if you are in a place of  great height (high altitude).  Avoid heavy lifting.  Wear low-heeled shoes. Sit and stand up straight.  You can continue to have sex unless your doctor tells you not to. Relieving pain and discomfort  Wear a good support bra if your breasts are tender.  Take warm water baths (sitz baths) to soothe pain or discomfort caused by hemorrhoids. Use hemorrhoid cream if your doctor approves.  Rest with your legs raised if you have leg cramps or low back pain.  If you develop puffy, bulging veins (varicose veins) in your legs: ? Wear support hose or compression stockings as told by your doctor. ? Raise (elevate) your feet for 15 minutes, 3-4 times a day. ? Limit salt in your food. Prenatal care  Write down your questions. Take them to your prenatal visits.  Keep all your prenatal visits as told by your doctor. This is important. Safety  Wear your seat belt when driving.  Make a list of emergency phone numbers, including numbers for family, friends, the hospital, and police and fire departments. General instructions  Ask your doctor about the right foods to eat or for help  finding a Social worker, if you need these services.  Ask your doctor about local prenatal classes. Begin classes before month 6 of your pregnancy.  Do not use hot tubs, steam rooms, or saunas.  Do not douche or use tampons or scented sanitary pads.  Do not cross your legs for long periods of time.  Visit your dentist if you have not done so. Use a soft toothbrush to brush your teeth. Floss gently.  Avoid all smoking, herbs, and alcohol. Avoid drugs that are not approved by your doctor.  Do not use any products that contain nicotine or tobacco, such as cigarettes and e-cigarettes. If you need help quitting, ask your doctor.  Avoid cat litter boxes and soil used by cats. These carry germs that can cause birth defects in the baby and can cause a loss of your baby (miscarriage) or stillbirth. Contact a doctor  if:  You have mild cramps or pressure in your lower belly.  You have pain when you pee (urinate).  You have bad smelling fluid coming from your vagina.  You continue to feel sick to your stomach (nauseous), throw up (vomit), or have watery poop (diarrhea).  You have a nagging pain in your belly area.  You feel dizzy. Get help right away if:  You have a fever.  You are leaking fluid from your vagina.  You have spotting or bleeding from your vagina.  You have severe belly cramping or pain.  You lose or gain weight rapidly.  You have trouble catching your breath and have chest pain.  You notice sudden or extreme puffiness (swelling) of your face, hands, ankles, feet, or legs.  You have not felt the baby move in over an hour.  You have severe headaches that do not go away when you take medicine.  You have trouble seeing. Summary  The second trimester is from week 14 through week 27 (months 4 through 6). This is often the time in pregnancy that you feel your best.  To take care of yourself and your unborn baby, you will need to eat healthy meals, take medicines only if your doctor tells you to do so, and do activities that are safe for you and your baby.  Call your doctor if you get sick or if you notice anything unusual about your pregnancy. Also, call your doctor if you need help with the right food to eat, or if you want to know what activities are safe for you. This information is not intended to replace advice given to you by your health care provider. Make sure you discuss any questions you have with your health care provider. Document Revised: 11/11/2018 Document Reviewed: 08/25/2016 Elsevier Patient Education  [place].

## 2020-06-04 ENCOUNTER — Other Ambulatory Visit: Payer: Self-pay | Admitting: Lactation Services

## 2020-06-04 DIAGNOSIS — J4520 Mild intermittent asthma, uncomplicated: Secondary | ICD-10-CM

## 2020-06-04 MED ORDER — ALBUTEROL SULFATE HFA 108 (90 BASE) MCG/ACT IN AERS: 2.0000 | INHALATION_SPRAY | Freq: Four times a day (QID) | RESPIRATORY_TRACT | 1 refills | Status: DC | PRN

## 2020-06-10 ENCOUNTER — Encounter: Payer: Self-pay | Admitting: Obstetrics and Gynecology

## 2020-06-11 ENCOUNTER — Encounter: Payer: Self-pay | Admitting: *Deleted

## 2020-06-17 ENCOUNTER — Other Ambulatory Visit: Payer: Self-pay | Admitting: *Deleted

## 2020-06-17 ENCOUNTER — Other Ambulatory Visit: Payer: Self-pay

## 2020-06-17 ENCOUNTER — Ambulatory Visit: Payer: Medicaid Other | Attending: Obstetrics and Gynecology

## 2020-06-17 ENCOUNTER — Ambulatory Visit: Payer: Medicaid Other | Admitting: *Deleted

## 2020-06-17 DIAGNOSIS — O24112 Pre-existing diabetes mellitus, type 2, in pregnancy, second trimester: Secondary | ICD-10-CM

## 2020-06-17 DIAGNOSIS — O0990 Supervision of high risk pregnancy, unspecified, unspecified trimester: Secondary | ICD-10-CM | POA: Insufficient documentation

## 2020-06-17 DIAGNOSIS — O10012 Pre-existing essential hypertension complicating pregnancy, second trimester: Secondary | ICD-10-CM | POA: Diagnosis not present

## 2020-06-17 DIAGNOSIS — O34219 Maternal care for unspecified type scar from previous cesarean delivery: Secondary | ICD-10-CM

## 2020-06-17 DIAGNOSIS — Z3A25 25 weeks gestation of pregnancy: Secondary | ICD-10-CM

## 2020-06-17 DIAGNOSIS — Z148 Genetic carrier of other disease: Secondary | ICD-10-CM

## 2020-06-17 DIAGNOSIS — O3412 Maternal care for benign tumor of corpus uteri, second trimester: Secondary | ICD-10-CM

## 2020-06-17 DIAGNOSIS — O10919 Unspecified pre-existing hypertension complicating pregnancy, unspecified trimester: Secondary | ICD-10-CM | POA: Diagnosis not present

## 2020-06-17 DIAGNOSIS — Z362 Encounter for other antenatal screening follow-up: Secondary | ICD-10-CM

## 2020-06-17 DIAGNOSIS — D259 Leiomyoma of uterus, unspecified: Secondary | ICD-10-CM

## 2020-06-17 DIAGNOSIS — O1212 Gestational proteinuria, second trimester: Secondary | ICD-10-CM

## 2020-06-17 DIAGNOSIS — E119 Type 2 diabetes mellitus without complications: Secondary | ICD-10-CM | POA: Diagnosis not present

## 2020-06-17 DIAGNOSIS — O99512 Diseases of the respiratory system complicating pregnancy, second trimester: Secondary | ICD-10-CM

## 2020-06-17 DIAGNOSIS — O99212 Obesity complicating pregnancy, second trimester: Secondary | ICD-10-CM | POA: Diagnosis not present

## 2020-06-17 DIAGNOSIS — J45909 Unspecified asthma, uncomplicated: Secondary | ICD-10-CM

## 2020-06-19 ENCOUNTER — Other Ambulatory Visit: Payer: Self-pay

## 2020-06-19 ENCOUNTER — Ambulatory Visit (INDEPENDENT_AMBULATORY_CARE_PROVIDER_SITE_OTHER): Payer: Medicaid Other | Admitting: Obstetrics & Gynecology

## 2020-06-19 VITALS — BP 117/79 | HR 98 | Wt 275.0 lb

## 2020-06-19 DIAGNOSIS — O34219 Maternal care for unspecified type scar from previous cesarean delivery: Secondary | ICD-10-CM

## 2020-06-19 DIAGNOSIS — O0990 Supervision of high risk pregnancy, unspecified, unspecified trimester: Secondary | ICD-10-CM

## 2020-06-19 DIAGNOSIS — O10919 Unspecified pre-existing hypertension complicating pregnancy, unspecified trimester: Secondary | ICD-10-CM

## 2020-06-19 DIAGNOSIS — O24112 Pre-existing diabetes mellitus, type 2, in pregnancy, second trimester: Secondary | ICD-10-CM

## 2020-06-19 LAB — POCT URINALYSIS DIP (DEVICE)
Bilirubin Urine: NEGATIVE
Glucose, UA: NEGATIVE mg/dL
Hgb urine dipstick: NEGATIVE
Ketones, ur: 15 mg/dL — AB
Nitrite: NEGATIVE
Protein, ur: NEGATIVE mg/dL
Specific Gravity, Urine: 1.025 (ref 1.005–1.030)
Urobilinogen, UA: 1 mg/dL (ref 0.0–1.0)
pH: 6 (ref 5.0–8.0)

## 2020-06-19 NOTE — Patient Instructions (Signed)

## 2020-06-19 NOTE — Progress Notes (Signed)
   PRENATAL VISIT NOTE  Subjective:  [name] is a 34 y.o. [phone] at [redacted]w[redacted]d being seen today for ongoing prenatal care.  She is currently monitored for the following issues for this high-risk pregnancy and has Sickle cell trait ([place]); Obesity in pregnancy; BMI 40.0-44.9, adult ([place]); Asthma, mild intermittent; History of cesarean delivery, currently pregnant; Uterine fibroid in pregnancy; Family history of diabetes mellitus in mother; Paresthesia of both feet; Visual field scotoma of both eyes; Other headache syndrome; Anxiety; Supervision of high risk pregnancy, antepartum; Chronic hypertension affecting pregnancy; Type 2 diabetes mellitus in pregnancy; Uncontrolled diabetes mellitus ([place]); Proteinuria affecting pregnancy; GBS bacteriuria; and Alpha thalassemia silent carrier on their problem list.  Patient reports no complaints.  Contractions: Irritability. Vag. Bleeding: None.  Movement: Present. Denies leaking of fluid.   The following portions of the patient's history were reviewed and updated as appropriate: allergies, current medications, past family history, past medical history, past social history, past surgical history and problem list.   Objective:   Vitals:   06/19/20 1336  BP: 117/79  Pulse: 98  Weight: 275 lb (124.7 kg)    Fetal Status: Fetal Heart Rate (bpm): 156 Fundal Height: 26 cm Movement: Present     General:  Alert, oriented and cooperative. Patient is in no acute distress.  Skin: Skin is warm and dry. No rash noted.   Cardiovascular: Normal heart rate noted  Respiratory: Normal respiratory effort, no problems with respiration noted  Abdomen: Soft, gravid, appropriate for gestational age.  Pain/Pressure: Present     Pelvic: Cervical exam deferred        Extremities: Normal range of motion.  Edema: None  Mental Status: Normal mood and affect. Normal behavior. Normal judgment and thought content.   Assessment and Plan:  Pregnancy: [id number] at [redacted]w[redacted]d 1.  Chronic hypertension affecting pregnancy BP is normal  2. Pregnancy with type 2 diabetes mellitus in second trimester Has had problem following her diet due to availability and has had values above 300 after eating pasta. She has cut back on portions and adjusted her insulin dosing with improvement. Will touch base with diabetes coordinator  3. Supervision of high risk pregnancy, antepartum F/u fetal echo and growth [place] as scheduled  4. History of cesarean delivery, currently pregnant For cord prolapse, needs to sign TOLAC vs RCS consent  Preterm labor symptoms and general obstetric precautions including but not limited to vaginal bleeding, contractions, leaking of fluid and fetal movement were reviewed in detail with the patient. Please refer to After Visit Summary for other counseling recommendations.   Return in about 2 weeks (around 07/03/2020).  Future Appointments  Date Time Provider [place]  07/15/2020  9:00 AM [facility] NURSE [facility]  07/15/2020  9:15 AM [facility] US2 [facility] [place]    Emeterio Reeve, MD

## 2020-06-26 ENCOUNTER — Other Ambulatory Visit: Payer: Self-pay

## 2020-06-26 ENCOUNTER — Encounter: Payer: Medicaid Other | Attending: Family | Admitting: Registered"

## 2020-06-26 ENCOUNTER — Ambulatory Visit: Payer: Medicaid Other | Admitting: Registered"

## 2020-06-26 DIAGNOSIS — E1165 Type 2 diabetes mellitus with hyperglycemia: Secondary | ICD-10-CM | POA: Diagnosis present

## 2020-06-26 DIAGNOSIS — O24119 Pre-existing diabetes mellitus, type 2, in pregnancy, unspecified trimester: Secondary | ICD-10-CM

## 2020-06-26 NOTE — Patient Instructions (Signed)
Continue working on reducing sweetened beverages To avoid low blood sugar work on eating structured meals and bolus for the meal you are eating.  When your Elenor Legato gives you low blood sugar alarm, prick your finger to verify number. If low or if you are having symptoms use the rule of 15 (15 g carbs/15 min) to treat low blood sugar. When eating only protein you can bolus less insulin Scan your Elenor Legato before meals and use the pencil (edit) to mark food and reminder in 2 hours to check your blood sugar.

## 2020-06-26 NOTE — Progress Notes (Signed)
This patient is accompanied in the office by her significant other.   Patient was seen on 06/26/20 for follow-up assessment and education for Gestational Diabetes. EDD 11/16/20, [redacted]w[redacted]d. Patient states changes to diet/lifestyle including reduced sweetened beverages from 6-12 sodas per day to 1-2 sodas.   Patient uses CGM, Freestyle libre2. Patient is not able to upload data to [place]  With CGM hard to make the distinction of FBS and postprandial numbers. Range of scanned readings x1 week 67-247 mg/dL  Per CGM blood sugar is mostly above range with occasional readings below 70. Pt states she had one episode in the 48s.  The following learning objectives reviewed during follow-up visit:   Elenor Legato functions to help mark meals and set reminder to check blood sugar in 2 hours.  Importance of structured meals with consistent carbohydrates and limiting sweetened beverages  How to make decisions for amount of bolus based on meals.  Future potential of using correction insulin for pre-meal high blood sugar  Potential causes of blood sugar excursions  Lag time with CGM and using finger sticks before treating low blood sugar  Use 70 instead of 60 for the threshhold to treat low blood sugar.  Plan:   Continue working on reducing sweetened beverages  To avoid low blood sugar work on eating structured meals and bolus for the meal you are eating.   When your Elenor Legato gives you low blood sugar alarm, prick your finger to verify number. If low or if you are having symptoms use the rule of 15 (15 g carbs/15 min) to treat low blood sugar.  When eating only protein you can bolus less insulin  Scan your Elenor Legato before meals and use the pencil (edit) to mark food and reminder in 2 hours to check your blood sugar.  Patient instructed to monitor glucose levels: FBS: 80 - 95 mg/dl 2 hour: 100<120 mg/dl  Patient received the following handouts:  none  Patient will be seen for follow-up within 2 weeks  or as needed.

## 2020-07-01 ENCOUNTER — Inpatient Hospital Stay (HOSPITAL_COMMUNITY)
Admission: AD | Admit: 2020-07-01 | Discharge: 2020-07-01 | Disposition: A | Discharge status: Home / Self Care | Payer: Medicaid Other | Attending: Family Medicine | Admitting: Family Medicine

## 2020-07-01 ENCOUNTER — Encounter (HOSPITAL_COMMUNITY): Payer: Self-pay | Admitting: Obstetrics and Gynecology

## 2020-07-01 ENCOUNTER — Other Ambulatory Visit: Payer: Self-pay

## 2020-07-01 DIAGNOSIS — O4702 False labor before 37 completed weeks of gestation, second trimester: Secondary | ICD-10-CM | POA: Insufficient documentation

## 2020-07-01 DIAGNOSIS — O0990 Supervision of high risk pregnancy, unspecified, unspecified trimester: Secondary | ICD-10-CM

## 2020-07-01 DIAGNOSIS — J45909 Unspecified asthma, uncomplicated: Secondary | ICD-10-CM | POA: Diagnosis not present

## 2020-07-01 DIAGNOSIS — Z3A27 27 weeks gestation of pregnancy: Secondary | ICD-10-CM

## 2020-07-01 DIAGNOSIS — Z8619 Personal history of other infectious and parasitic diseases: Secondary | ICD-10-CM | POA: Insufficient documentation

## 2020-07-01 DIAGNOSIS — Z79899 Other long term (current) drug therapy: Secondary | ICD-10-CM | POA: Insufficient documentation

## 2020-07-01 DIAGNOSIS — O24112 Pre-existing diabetes mellitus, type 2, in pregnancy, second trimester: Secondary | ICD-10-CM

## 2020-07-01 DIAGNOSIS — R8271 Bacteriuria: Secondary | ICD-10-CM

## 2020-07-01 DIAGNOSIS — Z87891 Personal history of nicotine dependence: Secondary | ICD-10-CM | POA: Insufficient documentation

## 2020-07-01 DIAGNOSIS — O10912 Unspecified pre-existing hypertension complicating pregnancy, second trimester: Secondary | ICD-10-CM | POA: Insufficient documentation

## 2020-07-01 DIAGNOSIS — O99512 Diseases of the respiratory system complicating pregnancy, second trimester: Secondary | ICD-10-CM | POA: Insufficient documentation

## 2020-07-01 DIAGNOSIS — O98512 Other viral diseases complicating pregnancy, second trimester: Secondary | ICD-10-CM | POA: Diagnosis not present

## 2020-07-01 DIAGNOSIS — Z833 Family history of diabetes mellitus: Secondary | ICD-10-CM | POA: Diagnosis not present

## 2020-07-01 DIAGNOSIS — Z8249 Family history of ischemic heart disease and other diseases of the circulatory system: Secondary | ICD-10-CM | POA: Insufficient documentation

## 2020-07-01 DIAGNOSIS — Z794 Long term (current) use of insulin: Secondary | ICD-10-CM | POA: Insufficient documentation

## 2020-07-01 DIAGNOSIS — E1165 Type 2 diabetes mellitus with hyperglycemia: Secondary | ICD-10-CM

## 2020-07-01 DIAGNOSIS — O99820 Streptococcus B carrier state complicating pregnancy: Secondary | ICD-10-CM | POA: Diagnosis not present

## 2020-07-01 DIAGNOSIS — O4700 False labor before 37 completed weeks of gestation, unspecified trimester: Secondary | ICD-10-CM

## 2020-07-01 DIAGNOSIS — O10919 Unspecified pre-existing hypertension complicating pregnancy, unspecified trimester: Secondary | ICD-10-CM

## 2020-07-01 HISTORY — DX: Unspecified hearing loss, unspecified ear: H91.90

## 2020-07-01 LAB — WET PREP, GENITAL
Clue Cells Wet Prep HPF POC: NONE SEEN
Sperm: NONE SEEN
Trich, Wet Prep: NONE SEEN
Yeast Wet Prep HPF POC: NONE SEEN

## 2020-07-01 LAB — URINALYSIS, ROUTINE W REFLEX MICROSCOPIC
Bilirubin Urine: NEGATIVE
Glucose, UA: >=500 mg/dL — AB
Hgb urine dipstick: NEGATIVE
Ketones, ur: NEGATIVE mg/dL
Leukocytes,Ua: NEGATIVE
Nitrite: NEGATIVE
Protein, ur: NEGATIVE mg/dL
Specific Gravity, Urine: 1.015 (ref 1.005–1.030)
pH: 7 (ref 5.0–8.0)

## 2020-07-01 LAB — FETAL FIBRONECTIN: Fetal Fibronectin: NEGATIVE

## 2020-07-01 MED ORDER — LACTATED RINGERS IV BOLUS
1000.0000 mL | Freq: Once | INTRAVENOUS | Status: AC
  Administered 2020-07-01: 1000 mL via INTRAVENOUS

## 2020-07-01 NOTE — MAU Note (Signed)
Pt discharge completed-finishing sandwich tray prior to discharge. Pt reports BS 108. States abdominal cramping, contraction pain resolved. Lower back pain bed but states continues due to uncomfortable stretcher. SO remains at bedside  And is supportive.

## 2020-07-01 NOTE — MAU Provider Note (Signed)
History     CSN: [id number]  Arrival date and time: 07/01/20 1717   First Provider Initiated Contact with Patient 07/01/20 1834       Chief Complaint  Patient presents with  . Contractions   HPI This is a 34yo [id number] at [redacted]w[redacted]d with pregnancy complicated by HTN, DM2, GBS. Patient seen for contractions that started last night and has continued for the past 24 hours. Contractions have been consistent throughout the day, but not at regular intervals. Feels that these are labor like contractions. Denies sex over the past 48 hours, denies leaking fluid, decreased fetal activity, vaginal discharge. Appetite normal.    OB History    Gravida  5   Para  3   Term  3   Preterm  0   AB  1   Living  3     SAB  1   TAB  0   Ectopic  0   Multiple  0   Live Births  3           Past Medical History:  Diagnosis Date  . Asthma   . Diabetes mellitus without complication ([place])   . DKA (diabetic ketoacidoses) 08/27/2018  . 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 approx 3-4cm at time of 01/2016 c-section  . GBS (group B streptococcus) infection   . Hearing loss    pt states she was tested in high school and told it was selective she says she continues to have a deficit and does not feel it is selective  . Herpes   . Hypertension   . Newly diagnosed diabetes ([place]) 01/10/2018   Diagnosed 01/10/18  A1c 7.5%  . Trichomonas vaginitis     Past Surgical History:  Procedure Laterality Date  . CESAREAN SECTION N/A 01/08/2016   Procedure: CESAREAN SECTION;  Surgeon: Aletha Halim, MD;  Location: [facility];  Service: Obstetrics;  Laterality: N/A;  . DILATION AND CURETTAGE OF UTERUS    . WISDOM TOOTH EXTRACTION      Family History  Problem Relation Age of Onset  . Diabetes Mother   . Hypertension Mother   . Hyperlipidemia Mother   . Heart disease Mother   . Breast cancer Mother        19  . Ovarian cancer Mother        Unknown age  . Diabetes Sister    . Diabetes Brother   . Breast cancer Cousin   . Heart disease Maternal Grandmother     Social History   Tobacco Use  . Smoking status: Former Smoker    Types: Cigarettes  . Smokeless tobacco: Never Used  . Tobacco comment: quit 2013  Vaping Use  . Vaping Use: Never used  Substance Use Topics  . Alcohol use: No  . Drug use: No    Allergies: No Known Allergies  Medications Prior to Admission  Medication Sig Dispense Refill Last Dose  . albuterol (VENTOLIN HFA) 108 (90 Base) MCG/ACT inhaler Inhale 2 puffs into the lungs every 6 (six) hours as needed for wheezing or shortness of breath. 1 each 1 Past Week at Unknown time  . insulin aspart (NOVOLOG) 100 UNIT/ML injection For use on Omnipod with continuous dosing. Basal rate 1.8 units per hour. Bolus of 11 units per meal and 5 units for snacks. 40 mL 6 07/01/2020 at Unknown time  . Insulin Disposable Pump (OMNIPOD 5 PACK) MISC 1 Device by Does not apply route every other day. 3 each  6 07/01/2020 at Unknown time  . Insulin Syringe-Needle U-100 (SAFETY INSULIN SYRINGES) 30G X 1/2" 1 ML MISC 1 Device by Does not apply route in the morning, at noon, in the evening, and at bedtime. 100 each 1 07/01/2020 at Unknown time  . Prenatal Vit-Fe Fumarate-FA (MULTIVITAMIN-PRENATAL) 27-0.8 MG TABS tablet Take 1 tablet by mouth daily at 12 noon.   06/30/2020 at Unknown time  . Accu-Chek Softclix Lancets lancets Use as instructed QID (Patient not taking: Reported on 03/15/2020) 100 each 12   . Continuous Blood Gluc Sensor (FREESTYLE LIBRE 2 SENSOR) MISC 1 Device by Does not apply route every 14 (fourteen) days. To check blood sugar levels at least 4 times a day. 2 each 8   . glucose blood (ACCU-CHEK GUIDE) test strip Use as instructed QID (Patient not taking: Reported on 03/15/2020) 100 each 12   . insulin detemir (LEVEMIR FLEXTOUCH) 100 UNIT/ML FlexPen 28 units with breakfast and 24 units qhs (Patient not taking: Reported on 04/03/2020) 15 mL 11   . Insulin  Pen Needle (TRUEPLUS PEN NEEDLES) 31G X 6 MM MISC Use to inject Levemir BID. (Patient not taking: Reported on 04/03/2020) 100 each 6   . promethazine (PHENERGAN) 25 MG tablet Take 1 tablet (25 mg total) by mouth every 6 (six) hours as needed for nausea or vomiting. (Patient not taking: Reported on 05/20/2020) 30 tablet 0     Review of Systems Physical Exam   Blood pressure 133/64, pulse 99, temperature 98.4 F (36.9 C), temperature source Oral, resp. rate 18, height 5\' 8"  (1.727 m), weight 125.6 kg, last menstrual period 11/17/2019, SpO2 100 %.  Physical Exam Vitals reviewed.  Constitutional:      Appearance: Normal appearance.  Cardiovascular:     Rate and Rhythm: Normal rate.     Pulses: Normal pulses.  Pulmonary:     Effort: Pulmonary effort is normal.  Abdominal:     General: Abdomen is flat. There is no distension.     Palpations: There is no mass.     Tenderness: There is no abdominal tenderness. There is no guarding or rebound.     Hernia: No hernia is present.  Skin:    Capillary Refill: Capillary refill takes less than 2 seconds.  Neurological:     General: No focal deficit present.     Mental Status: She is alert.  Psychiatric:        Mood and Affect: Mood normal.        Behavior: Behavior normal.        Thought Content: Thought content normal.        Judgment: Judgment normal.    Dilation: Closed Effacement (%): Thick Station: Ballotable Exam by:: Dr.Shakyia Bosso    MAU Course  Procedures NST nonreactive, but reassuring and appropriate for GA  MDM Start IVF bolus FFN, wet prep, GC/CT  Assessment and Plan     ICD-10-CM   1. [redacted] weeks gestation of pregnancy  Z3A.27   2. Supervision of high risk pregnancy, antepartum  O09.90   3. Chronic hypertension affecting pregnancy  O10.919   4. Pregnancy with type 2 diabetes mellitus in second trimester  O24.112   5. GBS bacteriuria  R82.71   6. Uncontrolled type 2 diabetes mellitus with hyperglycemia (HCC)  E11.65    7. Preterm uterine contractions  O47.00    Contractions gone after IVF bolus. Negative FFN.  Wet prep negative No evidence of UTI. D/c to home and f/u in office. Return precautions given.  Truett Mainland 07/01/2020, 6:33 PM

## 2020-07-01 NOTE — MAU Note (Signed)
Presents with c/o ctxs "all day".  Reports ctxs are consistent, but hasn't been keeping track.  Denies VB or LOF.  Endorses +FM.

## 2020-07-01 NOTE — Discharge Instructions (Signed)
Braxton Hicks Contractions °Contractions of the uterus can occur throughout pregnancy, but they are not always a sign that you are in labor. You may have practice contractions called Braxton Hicks contractions. These false labor contractions are sometimes confused with true labor. °What are Braxton Hicks contractions? °Braxton Hicks contractions are tightening movements that occur in the muscles of the uterus before labor. Unlike true labor contractions, these contractions do not result in opening (dilation) and thinning of the cervix. Toward the end of pregnancy (32-34 weeks), Braxton Hicks contractions can happen more often and may become stronger. These contractions are sometimes difficult to tell apart from true labor because they can be very uncomfortable. You should not feel embarrassed if you go to the hospital with false labor. °Sometimes, the only way to tell if you are in true labor is for your health care provider to look for changes in the cervix. The health care provider will do a physical exam and may monitor your contractions. If you are not in true labor, the exam should show that your cervix is not dilating and your water has not broken. °If there are no other health problems associated with your pregnancy, it is completely safe for you to be sent home with false labor. You may continue to have Braxton Hicks contractions until you go into true labor. °How to tell the difference between true labor and false labor °True labor °· Contractions last 30-70 seconds. °· Contractions become very regular. °· Discomfort is usually felt in the top of the uterus, and it spreads to the lower abdomen and low back. °· Contractions do not go away with walking. °· Contractions usually become more intense and increase in frequency. °· The cervix dilates and gets thinner. °False labor °· Contractions are usually shorter and not as strong as true labor contractions. °· Contractions are usually irregular. °· Contractions  are often felt in the front of the lower abdomen and in the groin. °· Contractions may go away when you walk around or change positions while lying down. °· Contractions get weaker and are shorter-lasting as time goes on. °· The cervix usually does not dilate or become thin. °Follow these instructions at home: ° °· Take over-the-counter and prescription medicines only as told by your health care provider. °· Keep up with your usual exercises and follow other instructions from your health care provider. °· Eat and drink lightly if you think you are going into labor. °· If Braxton Hicks contractions are making you uncomfortable: °? Change your position from lying down or resting to walking, or change from walking to resting. °? Sit and rest in a tub of warm water. °? Drink enough fluid to keep your urine pale yellow. Dehydration may cause these contractions. °? Do slow and deep breathing several times an hour. °· Keep all follow-up prenatal visits as told by your health care provider. This is important. °Contact a health care provider if: °· You have a fever. °· You have continuous pain in your abdomen. °Get help right away if: °· Your contractions become stronger, more regular, and closer together. °· You have fluid leaking or gushing from your vagina. °· You pass blood-tinged mucus (bloody show). °· You have bleeding from your vagina. °· You have low back pain that you never had before. °· You feel your baby’s head pushing down and causing pelvic pressure. °· Your baby is not moving inside you as much as it used to. °Summary °· Contractions that occur before labor are   called Braxton Hicks contractions, false labor, or practice contractions. °· Braxton Hicks contractions are usually shorter, weaker, farther apart, and less regular than true labor contractions. True labor contractions usually become progressively stronger and regular, and they become more frequent. °· Manage discomfort from Braxton Hicks contractions  by changing position, resting in a warm bath, drinking plenty of water, or practicing deep breathing. °This information is not intended to replace advice given to you by your health care provider. Make sure you discuss any questions you have with your health care provider. °Document Revised: 07/02/2017 Document Reviewed: 12/03/2016 °Elsevier Patient Education © 2020 [employer] ° °

## 2020-07-02 ENCOUNTER — Telehealth: Payer: Self-pay | Admitting: Lactation Services

## 2020-07-02 LAB — GC/CHLAMYDIA PROBE AMP (~~LOC~~) NOT AT ARMC
Chlamydia: NEGATIVE
Comment: NEGATIVE
Comment: NORMAL
Neisseria Gonorrhea: NEGATIVE

## 2020-07-02 NOTE — Telephone Encounter (Signed)
Called patient in regards to concerns of PTL. She went to the hospital last night and was checked out. She reports she is having intermittent contractions at home and is monitoring. She reports she is aware of when to return to the hospital if needed. She has no other questions or concerns at this time. She is to follow up in the office on 12/02.

## 2020-07-04 ENCOUNTER — Other Ambulatory Visit: Payer: Self-pay

## 2020-07-04 ENCOUNTER — Encounter: Payer: Self-pay | Admitting: Obstetrics and Gynecology

## 2020-07-04 ENCOUNTER — Ambulatory Visit (INDEPENDENT_AMBULATORY_CARE_PROVIDER_SITE_OTHER): Payer: Medicaid Other | Admitting: Obstetrics and Gynecology

## 2020-07-04 VITALS — BP 121/81 | HR 103 | Wt 278.5 lb

## 2020-07-04 DIAGNOSIS — Z23 Encounter for immunization: Secondary | ICD-10-CM | POA: Diagnosis not present

## 2020-07-04 DIAGNOSIS — O34219 Maternal care for unspecified type scar from previous cesarean delivery: Secondary | ICD-10-CM

## 2020-07-04 DIAGNOSIS — Z3A28 28 weeks gestation of pregnancy: Secondary | ICD-10-CM

## 2020-07-04 DIAGNOSIS — U071 COVID-19: Secondary | ICD-10-CM | POA: Insufficient documentation

## 2020-07-04 DIAGNOSIS — Z6841 Body Mass Index (BMI) 40.0 and over, adult: Secondary | ICD-10-CM

## 2020-07-04 DIAGNOSIS — O10919 Unspecified pre-existing hypertension complicating pregnancy, unspecified trimester: Secondary | ICD-10-CM

## 2020-07-04 DIAGNOSIS — O99210 Obesity complicating pregnancy, unspecified trimester: Secondary | ICD-10-CM

## 2020-07-04 DIAGNOSIS — O0990 Supervision of high risk pregnancy, unspecified, unspecified trimester: Secondary | ICD-10-CM

## 2020-07-04 DIAGNOSIS — R8271 Bacteriuria: Secondary | ICD-10-CM

## 2020-07-04 DIAGNOSIS — O24113 Pre-existing diabetes mellitus, type 2, in pregnancy, third trimester: Secondary | ICD-10-CM

## 2020-07-04 HISTORY — DX: COVID-19: U07.1

## 2020-07-04 MED ORDER — ASPIRIN EC 81 MG PO TBEC: 81.0000 mg | DELAYED_RELEASE_TABLET | Freq: Every day | ORAL | 10 refills | Status: DC

## 2020-07-04 NOTE — Progress Notes (Signed)
Prenatal Visit Note Date: 07/04/2020 Clinic: [facility]  Subjective:  [name] is a 34 y.o. [id number] at [redacted]w[redacted]d being seen today for ongoing prenatal care.  She is currently monitored for the following issues for this high-risk pregnancy and has Sickle cell trait ([place]); Obesity in pregnancy; BMI 40.0-44.9, adult ([place]); Asthma, mild intermittent; History of cesarean delivery, currently pregnant; Uterine fibroid in pregnancy; Paresthesia of both feet; Visual field scotoma of both eyes; Other headache syndrome; Anxiety; Supervision of high risk pregnancy, antepartum; Chronic hypertension affecting pregnancy; Type 2 diabetes mellitus in pregnancy; Uncontrolled diabetes mellitus ([place]); Proteinuria affecting pregnancy; GBS bacteriuria; Alpha thalassemia silent carrier; and COVID-19 on their problem list.  Patient reports no complaints.   Contractions: Irritability. Vag. Bleeding: None.  Movement: Present. Denies leaking of fluid.   The following portions of the patient's history were reviewed and updated as appropriate: allergies, current medications, past family history, past medical history, past social history, past surgical history and problem list. Problem list updated.  Objective:   Vitals:   07/04/20 0945  BP: 121/81  Pulse: (!) 103  Weight: 278 lb 8 oz (126.3 kg)    Fetal Status: Fetal Heart Rate (bpm): 152   Movement: Present     General:  Alert, oriented and cooperative. Patient is in no acute distress.  Skin: Skin is warm and dry. No rash noted.   Cardiovascular: Normal heart rate noted  Respiratory: Normal respiratory effort, no problems with respiration noted  Abdomen: Soft, gravid, appropriate for gestational age. Pain/Pressure: Present     Pelvic:  Cervical exam deferred        Extremities: Normal range of motion.  Edema: None  Mental Status: Normal mood and affect. Normal behavior. Normal judgment and thought content.   Urinalysis:       Assessment and Plan:  Pregnancy: [id number] at [redacted]w[redacted]d  1. [redacted] weeks gestation of pregnancy  2. Supervision of high risk pregnancy, antepartum D/w pt re: BC today. Leaning towards nexplanon.  - CBC - HIV antibody (with reflex) - RPR - Hemoglobin A1c - Culture, OB Urine  3. GBS bacteriuria toc today  4. Chronic hypertension affecting pregnancy Doing well on no meds. Pt not aware of low dose asa. I told it may provide some benefit at this point and would still recommend it  5. Pregnancy with type 2 diabetes mellitus in third trimester On omnipod. Pt unsure of basal dose but gives herself 11-12u with snacks and meals. Thanksgiving values very high but before and after are normal to slightly above normal. She meets with DM management in one week  Follow up 12/21 f/u fetal echo  Pt has rpt growth on 12/13. Start qwk testing at 32wks  6. History of cesarean delivery, currently pregnant 2017 pLTCS due to cord prolapse. D/w pt nv re: tolac  7. BMI 40.0-44.9, adult ([place])  8. Obesity in pregnancy  Preterm labor symptoms and general obstetric precautions including but not limited to vaginal bleeding, contractions, leaking of fluid and fetal movement were reviewed in detail with the patient. Please refer to After Visit Summary for other counseling recommendations.  Return in about 11 days (around 07/15/2020) for in person, md visit.   Aletha Halim, MD

## 2020-07-05 LAB — POCT URINALYSIS DIP (DEVICE)
Bilirubin Urine: NEGATIVE
Glucose, UA: 500 mg/dL — AB
Hgb urine dipstick: NEGATIVE
Ketones, ur: NEGATIVE mg/dL
Leukocytes,Ua: NEGATIVE
Nitrite: NEGATIVE
Protein, ur: NEGATIVE mg/dL
Specific Gravity, Urine: 1.02 (ref 1.005–1.030)
Urobilinogen, UA: 0.2 mg/dL (ref 0.0–1.0)
pH: 6 (ref 5.0–8.0)

## 2020-07-05 LAB — CBC
Hematocrit: 29.6 % — ABNORMAL LOW (ref 34.0–46.6)
Hemoglobin: 9.9 g/dL — ABNORMAL LOW (ref 11.1–15.9)
MCH: 26 pg — ABNORMAL LOW (ref 26.6–33.0)
MCHC: 33.4 g/dL (ref 31.5–35.7)
MCV: 78 fL — ABNORMAL LOW (ref 79–97)
Platelets: 237 10*3/uL (ref 150–450)
RBC: 3.81 x10E6/uL (ref 3.77–5.28)
RDW: 14.3 % (ref 11.7–15.4)
WBC: 7.5 10*3/uL (ref 3.4–10.8)

## 2020-07-05 LAB — HIV ANTIBODY (ROUTINE TESTING W REFLEX): HIV Screen 4th Generation wRfx: NONREACTIVE

## 2020-07-05 LAB — HEMOGLOBIN A1C
Est. average glucose Bld gHb Est-mCnc: 148 mg/dL
Hgb A1c MFr Bld: 6.8 % — ABNORMAL HIGH (ref 4.8–5.6)

## 2020-07-05 LAB — RPR: RPR Ser Ql: NONREACTIVE

## 2020-07-06 LAB — CULTURE, OB URINE

## 2020-07-06 LAB — URINE CULTURE, OB REFLEX

## 2020-07-07 ENCOUNTER — Other Ambulatory Visit: Payer: Self-pay | Admitting: Obstetrics and Gynecology

## 2020-07-07 ENCOUNTER — Encounter: Payer: Self-pay | Admitting: Obstetrics and Gynecology

## 2020-07-07 MED ORDER — FERROUS SULFATE 325 (65 FE) MG PO TABS: 325.0000 mg | ORAL_TABLET | Freq: Every day | ORAL | 1 refills | Status: DC

## 2020-07-07 MED ORDER — DOCUSATE SODIUM 100 MG PO CAPS: 100.0000 mg | ORAL_CAPSULE | Freq: Two times a day (BID) | ORAL | 1 refills | Status: DC | PRN

## 2020-07-08 ENCOUNTER — Encounter: Payer: Self-pay | Admitting: *Deleted

## 2020-07-11 ENCOUNTER — Other Ambulatory Visit: Payer: Medicaid Other | Admitting: Registered"

## 2020-07-11 ENCOUNTER — Other Ambulatory Visit: Payer: Self-pay

## 2020-07-11 ENCOUNTER — Encounter: Payer: Medicaid Other | Attending: Family | Admitting: Registered"

## 2020-07-11 ENCOUNTER — Encounter: Payer: Medicaid Other | Admitting: Registered"

## 2020-07-11 DIAGNOSIS — E1165 Type 2 diabetes mellitus with hyperglycemia: Secondary | ICD-10-CM | POA: Diagnosis not present

## 2020-07-15 ENCOUNTER — Ambulatory Visit (INDEPENDENT_AMBULATORY_CARE_PROVIDER_SITE_OTHER): Payer: Medicaid Other | Admitting: Obstetrics and Gynecology

## 2020-07-15 ENCOUNTER — Ambulatory Visit: Payer: Medicaid Other | Admitting: *Deleted

## 2020-07-15 ENCOUNTER — Encounter: Payer: Self-pay | Admitting: *Deleted

## 2020-07-15 ENCOUNTER — Encounter: Payer: Self-pay | Admitting: Obstetrics and Gynecology

## 2020-07-15 ENCOUNTER — Other Ambulatory Visit: Payer: Self-pay | Admitting: *Deleted

## 2020-07-15 ENCOUNTER — Ambulatory Visit: Payer: Medicaid Other | Attending: Obstetrics

## 2020-07-15 ENCOUNTER — Other Ambulatory Visit: Payer: Self-pay

## 2020-07-15 VITALS — BP 126/62 | HR 97 | Wt 277.9 lb

## 2020-07-15 DIAGNOSIS — O3413 Maternal care for benign tumor of corpus uteri, third trimester: Secondary | ICD-10-CM

## 2020-07-15 DIAGNOSIS — O0990 Supervision of high risk pregnancy, unspecified, unspecified trimester: Secondary | ICD-10-CM

## 2020-07-15 DIAGNOSIS — O34219 Maternal care for unspecified type scar from previous cesarean delivery: Secondary | ICD-10-CM

## 2020-07-15 DIAGNOSIS — O10919 Unspecified pre-existing hypertension complicating pregnancy, unspecified trimester: Secondary | ICD-10-CM

## 2020-07-15 DIAGNOSIS — O10013 Pre-existing essential hypertension complicating pregnancy, third trimester: Secondary | ICD-10-CM

## 2020-07-15 DIAGNOSIS — E109 Type 1 diabetes mellitus without complications: Secondary | ICD-10-CM

## 2020-07-15 DIAGNOSIS — O1213 Gestational proteinuria, third trimester: Secondary | ICD-10-CM

## 2020-07-15 DIAGNOSIS — Z362 Encounter for other antenatal screening follow-up: Secondary | ICD-10-CM

## 2020-07-15 DIAGNOSIS — D259 Leiomyoma of uterus, unspecified: Secondary | ICD-10-CM

## 2020-07-15 DIAGNOSIS — O24013 Pre-existing diabetes mellitus, type 1, in pregnancy, third trimester: Secondary | ICD-10-CM

## 2020-07-15 DIAGNOSIS — O99213 Obesity complicating pregnancy, third trimester: Secondary | ICD-10-CM

## 2020-07-15 DIAGNOSIS — Z3A29 29 weeks gestation of pregnancy: Secondary | ICD-10-CM

## 2020-07-15 DIAGNOSIS — R8271 Bacteriuria: Secondary | ICD-10-CM

## 2020-07-15 DIAGNOSIS — O24112 Pre-existing diabetes mellitus, type 2, in pregnancy, second trimester: Secondary | ICD-10-CM

## 2020-07-15 DIAGNOSIS — O99513 Diseases of the respiratory system complicating pregnancy, third trimester: Secondary | ICD-10-CM | POA: Diagnosis not present

## 2020-07-15 DIAGNOSIS — Z148 Genetic carrier of other disease: Secondary | ICD-10-CM

## 2020-07-15 DIAGNOSIS — J45909 Unspecified asthma, uncomplicated: Secondary | ICD-10-CM | POA: Diagnosis not present

## 2020-07-15 DIAGNOSIS — E669 Obesity, unspecified: Secondary | ICD-10-CM

## 2020-07-15 DIAGNOSIS — O24113 Pre-existing diabetes mellitus, type 2, in pregnancy, third trimester: Secondary | ICD-10-CM

## 2020-07-15 DIAGNOSIS — O99013 Anemia complicating pregnancy, third trimester: Secondary | ICD-10-CM

## 2020-07-15 MED ORDER — FERROUS SULFATE 325 (65 FE) MG PO TABS: 325.0000 mg | ORAL_TABLET | Freq: Every day | ORAL | 1 refills | Status: DC

## 2020-07-15 MED ORDER — PRENATAL GUMMIES/DHA & FA 0.4-32.5 MG PO CHEW: 1.0000 | CHEWABLE_TABLET | Freq: Every day | ORAL | 11 refills | Status: DC

## 2020-07-15 MED ORDER — ASPIRIN EC 81 MG PO TBEC: 81.0000 mg | DELAYED_RELEASE_TABLET | Freq: Every day | ORAL | 10 refills | Status: DC

## 2020-07-15 NOTE — Progress Notes (Signed)
Subjective:  [name] is a 34 y.o. [phone] at [redacted]w[redacted]d being seen today for ongoing prenatal care.  She is currently monitored for the following issues for this high-risk pregnancy and has Sickle cell trait ([place]); Obesity in pregnancy; BMI 40.0-44.9, adult ([place]); Asthma, mild intermittent; Anemia in pregnancy; History of cesarean delivery, currently pregnant; Uterine fibroid in pregnancy; Paresthesia of both feet; Visual field scotoma of both eyes; Other headache syndrome; Anxiety; Supervision of high risk pregnancy, antepartum; Chronic hypertension affecting pregnancy; Type 2 diabetes mellitus in pregnancy; Uncontrolled diabetes mellitus ([place]); Proteinuria affecting pregnancy; GBS bacteriuria; and Alpha thalassemia silent carrier on their problem list.  Patient reports no complaints.  Contractions: Irritability. Vag. Bleeding: None.  Movement: Present. Denies leaking of fluid.   The following portions of the patient's history were reviewed and updated as appropriate: allergies, current medications, past family history, past medical history, past social history, past surgical history and problem list. Problem list updated.  Objective:   Vitals:   07/15/20 1109  BP: 126/62  Pulse: 97  Weight: 277 lb 14.4 oz (126.1 kg)    Fetal Status: Fetal Heart Rate (bpm): 154   Movement: Present     General:  Alert, oriented and cooperative. Patient is in no acute distress.  Skin: Skin is warm and dry. No rash noted.   Cardiovascular: Normal heart rate noted  Respiratory: Normal respiratory effort, no problems with respiration noted  Abdomen: Soft, gravid, appropriate for gestational age. Pain/Pressure: Absent     Pelvic:  Cervical exam deferred        Extremities: Normal range of motion.     Mental Status: Normal mood and affect. Normal behavior. Normal judgment and thought content.   Urinalysis:      Assessment and Plan:  Pregnancy: [id number] at [redacted]w[redacted]d  1. Supervision of high risk pregnancy,  antepartum Stable  2. Chronic hypertension affecting pregnancy BP stable No meds Encouraged to take BASA qd Growth scan today Antenatal testing starting at 32 weeks  3. Pregnancy with type 2 diabetes mellitus in second trimester CBG's stable Saw DM educator last week U/S today, see above Continue with Omnipod  4. GBS bacteriuria TOC negative, Tx while in labor  5. History of cesarean delivery, currently pregnant Desires TOLAC, consent signed today  6. Anemia during pregnancy in third trimester Iron supplement encouraged  Preterm labor symptoms and general obstetric precautions including but not limited to vaginal bleeding, contractions, leaking of fluid and fetal movement were reviewed in detail with the patient. Please refer to After Visit Summary for other counseling recommendations.  Return in about 2 weeks (around 07/29/2020) for OB visit, face to face, MD only.   Chancy Milroy, MD

## 2020-07-15 NOTE — Patient Instructions (Signed)
Third Trimester of Pregnancy The third trimester is from week 28 through week 40 (months 7 through 9). The third trimester is a time when the unborn baby (fetus) is growing rapidly. At the end of the ninth month, the fetus is about 20 inches in length and weighs 6-10 pounds. Body changes during your third trimester Your body will continue to go through many changes during pregnancy. The changes vary from woman to woman. During the third trimester:  Your weight will continue to increase. You can expect to gain 25-35 pounds (11-16 kg) by the end of the pregnancy.  You may begin to get stretch marks on your hips, abdomen, and breasts.  You may urinate more often because the fetus is moving lower into your pelvis and pressing on your bladder.  You may develop or continue to have heartburn. This is caused by increased hormones that slow down muscles in the digestive tract.  You may develop or continue to have constipation because increased hormones slow digestion and cause the muscles that push waste through your intestines to relax.  You may develop hemorrhoids. These are swollen veins (varicose veins) in the rectum that can itch or be painful.  You may develop swollen, bulging veins (varicose veins) in your legs.  You may have increased body aches in the pelvis, back, or thighs. This is due to weight gain and increased hormones that are relaxing your joints.  You may have changes in your hair. These can include thickening of your hair, rapid growth, and changes in texture. Some women also have hair loss during or after pregnancy, or hair that feels dry or thin. Your hair will most likely return to normal after your baby is born.  Your breasts will continue to grow and they will continue to become tender. A yellow fluid (colostrum) may leak from your breasts. This is the first milk you are producing for your baby.  Your belly button may stick out.  You may notice more swelling in your hands,  face, or ankles.  You may have increased tingling or numbness in your hands, arms, and legs. The skin on your belly may also feel numb.  You may feel short of breath because of your expanding uterus.  You may have more problems sleeping. This can be caused by the size of your belly, increased need to urinate, and an increase in your body's metabolism.  You may notice the fetus "dropping," or moving lower in your abdomen (lightening).  You may have increased vaginal discharge.  You may notice your joints feel loose and you may have pain around your pelvic bone. What to expect at prenatal visits You will have prenatal exams every 2 weeks until week 36. Then you will have weekly prenatal exams. During a routine prenatal visit:  You will be weighed to make sure you and the baby are growing normally.  Your blood pressure will be taken.  Your abdomen will be measured to track your baby's growth.  The fetal heartbeat will be listened to.  Any test results from the previous visit will be discussed.  You may have a cervical check near your due date to see if your cervix has softened or thinned (effaced).  You will be tested for Group B streptococcus. This happens between 35 and 37 weeks. Your health care provider may ask you:  What your birth plan is.  How you are feeling.  If you are feeling the baby move.  If you have had any abnormal   symptoms, such as leaking fluid, bleeding, severe headaches, or abdominal cramping.  If you are using any tobacco products, including cigarettes, chewing tobacco, and electronic cigarettes.  If you have any questions. Other tests or screenings that may be performed during your third trimester include:  Blood tests that check for low iron levels (anemia).  Fetal testing to check the health, activity level, and growth of the fetus. Testing is done if you have certain medical conditions or if there are problems during the pregnancy.  Nonstress test  (NST). This test checks the health of your baby to make sure there are no signs of problems, such as the baby not getting enough oxygen. During this test, a belt is placed around your belly. The baby is made to move, and its heart rate is monitored during movement. What is false labor? False labor is a condition in which you feel small, irregular tightenings of the muscles in the womb (contractions) that usually go away with rest, changing position, or drinking water. These are called Braxton Hicks contractions. Contractions may last for hours, days, or even weeks before true labor sets in. If contractions come at regular intervals, become more frequent, increase in intensity, or become painful, you should see your health care provider. What are the signs of labor?  Abdominal cramps.  Regular contractions that start at 10 minutes apart and become stronger and more frequent with time.  Contractions that start on the top of the uterus and spread down to the lower abdomen and back.  Increased pelvic pressure and dull back pain.  A watery or bloody mucus discharge that comes from the vagina.  Leaking of amniotic fluid. This is also known as your "water breaking." It could be a slow trickle or a gush. Let your health care provider know if it has a color or strange odor. If you have any of these signs, call your health care provider right away, even if it is before your due date. Follow these instructions at home: Medicines  Follow your health care provider's instructions regarding medicine use. Specific medicines may be either safe or unsafe to take during pregnancy.  Take a prenatal vitamin that contains at least 600 micrograms (mcg) of folic acid.  If you develop constipation, try taking a stool softener if your health care provider approves. Eating and drinking   Eat a balanced diet that includes fresh fruits and vegetables, whole grains, good sources of protein such as meat, eggs, or tofu,  and low-fat dairy. Your health care provider will help you determine the amount of weight gain that is right for you.  Avoid raw meat and uncooked cheese. These carry germs that can cause birth defects in the baby.  If you have low calcium intake from food, talk to your health care provider about whether you should take a daily calcium supplement.  Eat four or five small meals rather than three large meals a day.  Limit foods that are high in fat and processed sugars, such as fried and sweet foods.  To prevent constipation: ? Drink enough fluid to keep your urine clear or pale yellow. ? Eat foods that are high in fiber, such as fresh fruits and vegetables, whole grains, and beans. Activity  Exercise only as directed by your health care provider. Most women can continue their usual exercise routine during pregnancy. Try to exercise for 30 minutes at least 5 days a week. Stop exercising if you experience uterine contractions.  Avoid heavy lifting.  Do   not exercise in extreme heat or humidity, or at high altitudes.  Wear low-heel, comfortable shoes.  Practice good posture.  You may continue to have sex unless your health care provider tells you otherwise. Relieving pain and discomfort  Take frequent breaks and rest with your legs elevated if you have leg cramps or low back pain.  Take warm sitz baths to soothe any pain or discomfort caused by hemorrhoids. Use hemorrhoid cream if your health care provider approves.  Wear a good support bra to prevent discomfort from breast tenderness.  If you develop varicose veins: ? Wear support pantyhose or compression stockings as told by your healthcare provider. ? Elevate your feet for 15 minutes, 3-4 times a day. Prenatal care  Write down your questions. Take them to your prenatal visits.  Keep all your prenatal visits as told by your health care provider. This is important. Safety  Wear your seat belt at all times when driving.  Make  a list of emergency phone numbers, including numbers for family, friends, the hospital, and police and fire departments. General instructions  Avoid cat litter boxes and soil used by cats. These carry germs that can cause birth defects in the baby. If you have a cat, ask someone to clean the litter box for you.  Do not travel far distances unless it is absolutely necessary and only with the approval of your health care provider.  Do not use hot tubs, steam rooms, or saunas.  Do not drink alcohol.  Do not use any products that contain nicotine or tobacco, such as cigarettes and e-cigarettes. If you need help quitting, ask your health care provider.  Do not use any medicinal herbs or unprescribed drugs. These chemicals affect the formation and growth of the baby.  Do not douche or use tampons or scented sanitary pads.  Do not cross your legs for long periods of time.  To prepare for the arrival of your baby: ? Take prenatal classes to understand, practice, and ask questions about labor and delivery. ? Make a trial run to the hospital. ? Visit the hospital and tour the maternity area. ? Arrange for maternity or paternity leave through employers. ? Arrange for family and friends to take care of pets while you are in the hospital. ? Purchase a rear-facing car seat and make sure you know how to install it in your car. ? Pack your hospital bag. ? Prepare the baby's nursery. Make sure to remove all pillows and stuffed animals from the baby's crib to prevent suffocation.  Visit your dentist if you have not gone during your pregnancy. Use a soft toothbrush to brush your teeth and be gentle when you floss. Contact a health care provider if:  You are unsure if you are in labor or if your water has broken.  You become dizzy.  You have mild pelvic cramps, pelvic pressure, or nagging pain in your abdominal area.  You have lower back pain.  You have persistent nausea, vomiting, or  diarrhea.  You have an unusual or bad smelling vaginal discharge.  You have pain when you urinate. Get help right away if:  Your water breaks before 37 weeks.  You have regular contractions less than 5 minutes apart before 37 weeks.  You have a fever.  You are leaking fluid from your vagina.  You have spotting or bleeding from your vagina.  You have severe abdominal pain or cramping.  You have rapid weight loss or weight gain.  You have   shortness of breath with chest pain.  You notice sudden or extreme swelling of your face, hands, ankles, feet, or legs.  Your baby makes fewer than 10 movements in 2 hours.  You have severe headaches that do not go away when you take medicine.  You have vision changes. Summary  The third trimester is from week 28 through week 40, months 7 through 9. The third trimester is a time when the unborn baby (fetus) is growing rapidly.  During the third trimester, your discomfort may increase as you and your baby continue to gain weight. You may have abdominal, leg, and back pain, sleeping problems, and an increased need to urinate.  During the third trimester your breasts will keep growing and they will continue to become tender. A yellow fluid (colostrum) may leak from your breasts. This is the first milk you are producing for your baby.  False labor is a condition in which you feel small, irregular tightenings of the muscles in the womb (contractions) that eventually go away. These are called Braxton Hicks contractions. Contractions may last for hours, days, or even weeks before true labor sets in.  Signs of labor can include: abdominal cramps; regular contractions that start at 10 minutes apart and become stronger and more frequent with time; watery or bloody mucus discharge that comes from the vagina; increased pelvic pressure and dull back pain; and leaking of amniotic fluid. This information is not intended to replace advice given to you by your  health care provider. Make sure you discuss any questions you have with your health care provider. Document Revised: 11/10/2018 Document Reviewed: 08/25/2016 Elsevier Patient Education  2020 [employer]  

## 2020-07-16 ENCOUNTER — Other Ambulatory Visit: Payer: Self-pay

## 2020-07-29 ENCOUNTER — Other Ambulatory Visit: Payer: Self-pay

## 2020-07-29 ENCOUNTER — Ambulatory Visit (INDEPENDENT_AMBULATORY_CARE_PROVIDER_SITE_OTHER): Payer: Medicaid Other | Admitting: Obstetrics & Gynecology

## 2020-07-29 VITALS — BP 128/82 | HR 105 | Wt 276.0 lb

## 2020-07-29 DIAGNOSIS — Z23 Encounter for immunization: Secondary | ICD-10-CM

## 2020-07-29 DIAGNOSIS — Z3A31 31 weeks gestation of pregnancy: Secondary | ICD-10-CM

## 2020-07-29 DIAGNOSIS — O10919 Unspecified pre-existing hypertension complicating pregnancy, unspecified trimester: Secondary | ICD-10-CM

## 2020-07-29 DIAGNOSIS — O0990 Supervision of high risk pregnancy, unspecified, unspecified trimester: Secondary | ICD-10-CM

## 2020-07-29 DIAGNOSIS — O34219 Maternal care for unspecified type scar from previous cesarean delivery: Secondary | ICD-10-CM

## 2020-07-29 DIAGNOSIS — O99013 Anemia complicating pregnancy, third trimester: Secondary | ICD-10-CM

## 2020-07-29 LAB — POCT URINALYSIS DIP (DEVICE)
Bilirubin Urine: NEGATIVE
Glucose, UA: NEGATIVE mg/dL
Hgb urine dipstick: NEGATIVE
Ketones, ur: 15 mg/dL — AB
Nitrite: NEGATIVE
Protein, ur: NEGATIVE mg/dL
Specific Gravity, Urine: 1.025 (ref 1.005–1.030)
Urobilinogen, UA: 0.2 mg/dL (ref 0.0–1.0)
pH: 6 (ref 5.0–8.0)

## 2020-07-29 NOTE — Progress Notes (Addendum)
° °  PRENATAL VISIT NOTE  Subjective:  [name] is a 34 y.o. [phone] at [redacted]w[redacted]d being seen today for ongoing prenatal care.  She is currently monitored for the following issues for this high-risk pregnancy and has Sickle cell trait (HCC); Obesity in pregnancy; BMI 40.0-44.9, adult (HCC); Asthma, mild intermittent; Anemia in pregnancy; History of cesarean delivery, currently pregnant; Uterine fibroid in pregnancy; Paresthesia of both feet; Visual field scotoma of both eyes; Other headache syndrome; Anxiety; Supervision of high risk pregnancy, antepartum; Chronic hypertension affecting pregnancy; Type 2 diabetes mellitus in pregnancy; Uncontrolled diabetes mellitus (HCC); Proteinuria affecting pregnancy; GBS bacteriuria; and Alpha thalassemia silent carrier on their problem list.  Patient reports no complaints.  Contractions: Irritability. Vag. Bleeding: None.  Movement: Present. Denies leaking of fluid.   She did not bring blood sugars with her but partner states fastings are in the 90s and evening blood is 120 and post prandial BS are <120.  HBA1c 07/04/2020 was 6.8 (down from 13.9).  The following portions of the patient's history were reviewed and updated as appropriate: allergies, current medications, past family history, past medical history, past social history, past surgical history and problem list.   Objective:   Vitals:   07/29/20 1418  BP: 128/82  Pulse: (!) 105  Weight: 276 lb (125.2 kg)    Fetal Status: Fetal Heart Rate (bpm): 143 Fundal Height: 31 cm Movement: Present     General:  Alert, oriented and cooperative. Patient is in no acute distress.  Skin: Skin is warm and dry. No rash noted.   Cardiovascular: Normal heart rate noted  Respiratory: Normal respiratory effort, no problems with respiration noted  Abdomen: Soft, gravid, appropriate for gestational age.  Pain/Pressure: Present     Pelvic: Cervical exam deferred        Extremities: Normal range of motion.  Edema:  None  Mental Status: Normal mood and affect. Normal behavior. Normal judgment and thought content.   Assessment and Plan:  Pregnancy: [id number] at [redacted]w[redacted]d 1. Supervision of high risk pregnancy, antepartum - starting weekly BPPs - Flu Vaccine QUAD 36+ mos IM - continue monthly growth scans.  Next appt 1/62022.  2. [redacted] weeks gestation of pregnancy  3. Chronic hypertension affecting pregnancy - on no medications.  BPs are good.  Values at home at normal as well.  4. Anemia during pregnancy in third trimester - encouraged every other day iron.  5. History of cesarean delivery, currently pregnant - planning TOL  Consent signed.    7. Fetal pericardial effusion.   - f/u echo 07/23/2020 stable.  Follow up recommended if indicated by ob/gyn.  Preterm labor symptoms and general obstetric precautions including but not limited to vaginal bleeding, contractions, leaking of fluid and fetal movement were reviewed in detail with the patient. Please refer to After Visit Summary for other counseling recommendations.   Return in about 2 weeks (around 08/12/2020) for Office Ob visit (MD only).  Future Appointments  Date Time Provider Department Center  08/08/2020  9:15 AM [facility] NURSE [facility]  08/08/2020  9:30 AM [facility] US3 [facility]  08/15/2020  7:30 AM [facility] NURSE [facility]  08/15/2020  7:45 AM [facility] US5 [facility]    Jerene Bears, MD

## 2020-08-08 ENCOUNTER — Encounter: Payer: Self-pay | Admitting: *Deleted

## 2020-08-08 ENCOUNTER — Ambulatory Visit: Payer: Medicaid Other | Admitting: *Deleted

## 2020-08-08 ENCOUNTER — Other Ambulatory Visit: Payer: Self-pay

## 2020-08-08 ENCOUNTER — Ambulatory Visit: Payer: Medicaid Other | Attending: Obstetrics and Gynecology

## 2020-08-08 DIAGNOSIS — O0990 Supervision of high risk pregnancy, unspecified, unspecified trimester: Secondary | ICD-10-CM | POA: Diagnosis present

## 2020-08-08 DIAGNOSIS — E669 Obesity, unspecified: Secondary | ICD-10-CM

## 2020-08-08 DIAGNOSIS — O34219 Maternal care for unspecified type scar from previous cesarean delivery: Secondary | ICD-10-CM

## 2020-08-08 DIAGNOSIS — O24013 Pre-existing diabetes mellitus, type 1, in pregnancy, third trimester: Secondary | ICD-10-CM

## 2020-08-08 DIAGNOSIS — O3413 Maternal care for benign tumor of corpus uteri, third trimester: Secondary | ICD-10-CM

## 2020-08-08 DIAGNOSIS — O10919 Unspecified pre-existing hypertension complicating pregnancy, unspecified trimester: Secondary | ICD-10-CM

## 2020-08-08 DIAGNOSIS — O10913 Unspecified pre-existing hypertension complicating pregnancy, third trimester: Secondary | ICD-10-CM

## 2020-08-08 DIAGNOSIS — Z362 Encounter for other antenatal screening follow-up: Secondary | ICD-10-CM

## 2020-08-08 DIAGNOSIS — O1213 Gestational proteinuria, third trimester: Secondary | ICD-10-CM

## 2020-08-08 DIAGNOSIS — O99213 Obesity complicating pregnancy, third trimester: Secondary | ICD-10-CM | POA: Diagnosis not present

## 2020-08-08 DIAGNOSIS — O24113 Pre-existing diabetes mellitus, type 2, in pregnancy, third trimester: Secondary | ICD-10-CM | POA: Diagnosis not present

## 2020-08-08 DIAGNOSIS — O99513 Diseases of the respiratory system complicating pregnancy, third trimester: Secondary | ICD-10-CM

## 2020-08-08 DIAGNOSIS — O24112 Pre-existing diabetes mellitus, type 2, in pregnancy, second trimester: Secondary | ICD-10-CM | POA: Insufficient documentation

## 2020-08-08 DIAGNOSIS — Z3A33 33 weeks gestation of pregnancy: Secondary | ICD-10-CM

## 2020-08-08 DIAGNOSIS — J45909 Unspecified asthma, uncomplicated: Secondary | ICD-10-CM

## 2020-08-08 DIAGNOSIS — Z148 Genetic carrier of other disease: Secondary | ICD-10-CM

## 2020-08-09 ENCOUNTER — Other Ambulatory Visit: Payer: Self-pay | Admitting: *Deleted

## 2020-08-09 DIAGNOSIS — O24113 Pre-existing diabetes mellitus, type 2, in pregnancy, third trimester: Secondary | ICD-10-CM

## 2020-08-15 ENCOUNTER — Ambulatory Visit: Payer: Medicaid Other | Attending: Obstetrics and Gynecology

## 2020-08-15 ENCOUNTER — Encounter: Payer: Self-pay | Admitting: *Deleted

## 2020-08-15 ENCOUNTER — Encounter: Payer: Self-pay | Admitting: Registered"

## 2020-08-15 ENCOUNTER — Other Ambulatory Visit: Payer: Self-pay

## 2020-08-15 ENCOUNTER — Ambulatory Visit (INDEPENDENT_AMBULATORY_CARE_PROVIDER_SITE_OTHER): Payer: Medicaid Other | Admitting: Obstetrics and Gynecology

## 2020-08-15 ENCOUNTER — Other Ambulatory Visit: Payer: Self-pay | Admitting: *Deleted

## 2020-08-15 ENCOUNTER — Ambulatory Visit: Payer: Medicaid Other | Admitting: *Deleted

## 2020-08-15 VITALS — BP 137/75 | HR 96 | Wt 273.0 lb

## 2020-08-15 DIAGNOSIS — O1213 Gestational proteinuria, third trimester: Secondary | ICD-10-CM

## 2020-08-15 DIAGNOSIS — Z362 Encounter for other antenatal screening follow-up: Secondary | ICD-10-CM

## 2020-08-15 DIAGNOSIS — O34219 Maternal care for unspecified type scar from previous cesarean delivery: Secondary | ICD-10-CM

## 2020-08-15 DIAGNOSIS — O10919 Unspecified pre-existing hypertension complicating pregnancy, unspecified trimester: Secondary | ICD-10-CM | POA: Insufficient documentation

## 2020-08-15 DIAGNOSIS — O3410 Maternal care for benign tumor of corpus uteri, unspecified trimester: Secondary | ICD-10-CM

## 2020-08-15 DIAGNOSIS — J45909 Unspecified asthma, uncomplicated: Secondary | ICD-10-CM

## 2020-08-15 DIAGNOSIS — E109 Type 1 diabetes mellitus without complications: Secondary | ICD-10-CM

## 2020-08-15 DIAGNOSIS — O24113 Pre-existing diabetes mellitus, type 2, in pregnancy, third trimester: Secondary | ICD-10-CM | POA: Diagnosis present

## 2020-08-15 DIAGNOSIS — D259 Leiomyoma of uterus, unspecified: Secondary | ICD-10-CM

## 2020-08-15 DIAGNOSIS — O99210 Obesity complicating pregnancy, unspecified trimester: Secondary | ICD-10-CM

## 2020-08-15 DIAGNOSIS — O99213 Obesity complicating pregnancy, third trimester: Secondary | ICD-10-CM | POA: Diagnosis not present

## 2020-08-15 DIAGNOSIS — O0990 Supervision of high risk pregnancy, unspecified, unspecified trimester: Secondary | ICD-10-CM | POA: Diagnosis present

## 2020-08-15 DIAGNOSIS — O10913 Unspecified pre-existing hypertension complicating pregnancy, third trimester: Secondary | ICD-10-CM

## 2020-08-15 DIAGNOSIS — R8271 Bacteriuria: Secondary | ICD-10-CM

## 2020-08-15 DIAGNOSIS — D573 Sickle-cell trait: Secondary | ICD-10-CM

## 2020-08-15 DIAGNOSIS — O24013 Pre-existing diabetes mellitus, type 1, in pregnancy, third trimester: Secondary | ICD-10-CM

## 2020-08-15 DIAGNOSIS — Z3A34 34 weeks gestation of pregnancy: Secondary | ICD-10-CM

## 2020-08-15 DIAGNOSIS — J4520 Mild intermittent asthma, uncomplicated: Secondary | ICD-10-CM

## 2020-08-15 DIAGNOSIS — Z148 Genetic carrier of other disease: Secondary | ICD-10-CM

## 2020-08-15 DIAGNOSIS — Z6841 Body Mass Index (BMI) 40.0 and over, adult: Secondary | ICD-10-CM

## 2020-08-15 DIAGNOSIS — O10013 Pre-existing essential hypertension complicating pregnancy, third trimester: Secondary | ICD-10-CM

## 2020-08-15 DIAGNOSIS — O99513 Diseases of the respiratory system complicating pregnancy, third trimester: Secondary | ICD-10-CM

## 2020-08-15 DIAGNOSIS — E669 Obesity, unspecified: Secondary | ICD-10-CM

## 2020-08-15 DIAGNOSIS — D563 Thalassemia minor: Secondary | ICD-10-CM

## 2020-08-15 DIAGNOSIS — O3413 Maternal care for benign tumor of corpus uteri, third trimester: Secondary | ICD-10-CM

## 2020-08-15 DIAGNOSIS — O99013 Anemia complicating pregnancy, third trimester: Secondary | ICD-10-CM

## 2020-08-15 NOTE — Progress Notes (Signed)
RD checked in with Patient while she was here for MD visit. Patient states she stopped using [employer] because she was paying out of pocket and too expensive. Pt reports she is using [employer] now. Pt has not been writing CBGs on log, Dr. Elgie Congo provided paper for her to track. Pt states her phone needs to be replaced so downloading app to track is not possible. Pt reports FBS: avg 90 and PPBG 114 with one recent high of 150 mg/dL.  Pt states no questions or concerns for RD to address.

## 2020-08-15 NOTE — Progress Notes (Signed)
   PRENATAL VISIT NOTE  Subjective:  [name] is a 35 y.o. [phone] at 100w0d being seen today for ongoing prenatal care.  She is currently monitored for the following issues for this high-risk pregnancy and has Sickle cell trait ([place]); Obesity in pregnancy; BMI 40.0-44.9, adult ([place]); Asthma, mild intermittent; Anemia in pregnancy; History of cesarean delivery, currently pregnant; Uterine fibroid in pregnancy; Paresthesia of both feet; Visual field scotoma of both eyes; Other headache syndrome; Anxiety; Supervision of high risk pregnancy, antepartum; Chronic hypertension affecting pregnancy; Type 2 diabetes mellitus in pregnancy; Uncontrolled diabetes mellitus ([place]); Proteinuria affecting pregnancy; GBS bacteriuria; and Alpha thalassemia silent carrier on their problem list.  Patient doing well with no acute concerns today. She reports no complaints.  Contractions: Irritability. Vag. Bleeding: None.  Movement: Present. Denies leaking of fluid.   The following portions of the patient's history were reviewed and updated as appropriate: allergies, current medications, past family history, past medical history, past social history, past surgical history and problem list. Problem list updated.  Objective:   Vitals:   08/15/20 0932  BP: 137/75  Pulse: 96  Weight: 273 lb (123.8 kg)    Fetal Status: Fetal Heart Rate (bpm): 140   Movement: Present     General:  Alert, oriented and cooperative. Patient is in no acute distress.  Skin: Skin is warm and dry. No rash noted.   Cardiovascular: Normal heart rate noted  Respiratory: Normal respiratory effort, no problems with respiration noted  Abdomen: Soft, gravid, appropriate for gestational age.  Pain/Pressure: Present     Pelvic: Cervical exam deferred        Extremities: Normal range of motion.  Edema: None  Mental Status:  Normal mood and affect. Normal behavior. Normal judgment and thought content.  FBS: 106 today, per pt avg is 90 PPBS:  per pt avg is 114, she did not bring values today Assessment and Plan:  Pregnancy: G5P3013 at [redacted]w[redacted]d  1. Supervision of high risk pregnancy, antepartum Continue weekly testing  2. Chronic hypertension affecting pregnancy BP within normal range  3. Mild intermittent asthma without complication No issue  4. Pregnancy with type 2 diabetes mellitus in third trimester Pt advised to continue blood glucose measurements, per values given decent control  5. Uterine fibroid in pregnancy   6. Sickle cell trait ([place])   7. Obesity in pregnancy   8. BMI 40.0-44.9, adult ([place])   9. Anemia during pregnancy in third trimester   10. History of cesarean delivery, currently pregnant Pt still desires TOLAC  11. GBS bacteriuria Treat in labor  12. Alpha thalassemia silent carrier   Preterm labor symptoms and general obstetric precautions including but not limited to vaginal bleeding, contractions, leaking of fluid and fetal movement were reviewed in detail with the patient.  Please refer to After Visit Summary for other counseling recommendations.   Return in about 2 weeks (around 08/29/2020) for [facility], in person, 36 weeks swabs.   Lynnda Shields, MD

## 2020-08-22 ENCOUNTER — Other Ambulatory Visit: Payer: Self-pay

## 2020-08-22 ENCOUNTER — Ambulatory Visit: Payer: Medicaid Other | Admitting: *Deleted

## 2020-08-22 ENCOUNTER — Ambulatory Visit: Payer: Medicaid Other | Attending: Obstetrics and Gynecology

## 2020-08-22 ENCOUNTER — Encounter: Payer: Self-pay | Admitting: *Deleted

## 2020-08-22 DIAGNOSIS — O99213 Obesity complicating pregnancy, third trimester: Secondary | ICD-10-CM | POA: Diagnosis not present

## 2020-08-22 DIAGNOSIS — E669 Obesity, unspecified: Secondary | ICD-10-CM

## 2020-08-22 DIAGNOSIS — O0990 Supervision of high risk pregnancy, unspecified, unspecified trimester: Secondary | ICD-10-CM | POA: Insufficient documentation

## 2020-08-22 DIAGNOSIS — O10919 Unspecified pre-existing hypertension complicating pregnancy, unspecified trimester: Secondary | ICD-10-CM

## 2020-08-22 DIAGNOSIS — O34219 Maternal care for unspecified type scar from previous cesarean delivery: Secondary | ICD-10-CM | POA: Diagnosis not present

## 2020-08-22 DIAGNOSIS — O10913 Unspecified pre-existing hypertension complicating pregnancy, third trimester: Secondary | ICD-10-CM

## 2020-08-22 DIAGNOSIS — Z3A35 35 weeks gestation of pregnancy: Secondary | ICD-10-CM

## 2020-08-22 DIAGNOSIS — O24113 Pre-existing diabetes mellitus, type 2, in pregnancy, third trimester: Secondary | ICD-10-CM | POA: Diagnosis not present

## 2020-08-26 ENCOUNTER — Other Ambulatory Visit: Payer: Self-pay | Admitting: *Deleted

## 2020-08-26 MED ORDER — VALACYCLOVIR HCL 500 MG PO TABS: 1000.0000 mg | ORAL_TABLET | Freq: Two times a day (BID) | ORAL | 0 refills | Status: DC

## 2020-08-26 MED ORDER — VALACYCLOVIR HCL 500 MG PO TABS: 1000.0000 mg | ORAL_TABLET | Freq: Every day | ORAL | 0 refills | Status: DC

## 2020-08-29 ENCOUNTER — Other Ambulatory Visit: Payer: Self-pay

## 2020-08-29 ENCOUNTER — Ambulatory Visit: Payer: Medicaid Other | Attending: Obstetrics and Gynecology

## 2020-08-29 ENCOUNTER — Encounter: Payer: Self-pay | Admitting: *Deleted

## 2020-08-29 ENCOUNTER — Other Ambulatory Visit (HOSPITAL_COMMUNITY)
Admission: RE | Admit: 2020-08-29 | Discharge: 2020-08-29 | Disposition: A | Discharge status: Home / Self Care | Payer: Medicaid Other | Source: Ambulatory Visit | Attending: Obstetrics and Gynecology | Admitting: Obstetrics and Gynecology

## 2020-08-29 ENCOUNTER — Ambulatory Visit: Payer: Medicaid Other | Admitting: *Deleted

## 2020-08-29 ENCOUNTER — Ambulatory Visit (INDEPENDENT_AMBULATORY_CARE_PROVIDER_SITE_OTHER): Payer: Medicaid Other | Admitting: Obstetrics and Gynecology

## 2020-08-29 VITALS — BP 125/77 | HR 96 | Wt 278.3 lb

## 2020-08-29 DIAGNOSIS — Z362 Encounter for other antenatal screening follow-up: Secondary | ICD-10-CM

## 2020-08-29 DIAGNOSIS — O1213 Gestational proteinuria, third trimester: Secondary | ICD-10-CM

## 2020-08-29 DIAGNOSIS — O10919 Unspecified pre-existing hypertension complicating pregnancy, unspecified trimester: Secondary | ICD-10-CM | POA: Insufficient documentation

## 2020-08-29 DIAGNOSIS — D259 Leiomyoma of uterus, unspecified: Secondary | ICD-10-CM

## 2020-08-29 DIAGNOSIS — O0990 Supervision of high risk pregnancy, unspecified, unspecified trimester: Secondary | ICD-10-CM | POA: Diagnosis not present

## 2020-08-29 DIAGNOSIS — O24113 Pre-existing diabetes mellitus, type 2, in pregnancy, third trimester: Secondary | ICD-10-CM | POA: Diagnosis not present

## 2020-08-29 DIAGNOSIS — D563 Thalassemia minor: Secondary | ICD-10-CM

## 2020-08-29 DIAGNOSIS — O24013 Pre-existing diabetes mellitus, type 1, in pregnancy, third trimester: Secondary | ICD-10-CM

## 2020-08-29 DIAGNOSIS — O3413 Maternal care for benign tumor of corpus uteri, third trimester: Secondary | ICD-10-CM | POA: Diagnosis not present

## 2020-08-29 DIAGNOSIS — O99013 Anemia complicating pregnancy, third trimester: Secondary | ICD-10-CM

## 2020-08-29 DIAGNOSIS — J45909 Unspecified asthma, uncomplicated: Secondary | ICD-10-CM

## 2020-08-29 DIAGNOSIS — E669 Obesity, unspecified: Secondary | ICD-10-CM

## 2020-08-29 DIAGNOSIS — O10013 Pre-existing essential hypertension complicating pregnancy, third trimester: Secondary | ICD-10-CM

## 2020-08-29 DIAGNOSIS — O99513 Diseases of the respiratory system complicating pregnancy, third trimester: Secondary | ICD-10-CM

## 2020-08-29 DIAGNOSIS — O34219 Maternal care for unspecified type scar from previous cesarean delivery: Secondary | ICD-10-CM

## 2020-08-29 DIAGNOSIS — O99210 Obesity complicating pregnancy, unspecified trimester: Secondary | ICD-10-CM

## 2020-08-29 DIAGNOSIS — Z148 Genetic carrier of other disease: Secondary | ICD-10-CM

## 2020-08-29 DIAGNOSIS — O99213 Obesity complicating pregnancy, third trimester: Secondary | ICD-10-CM

## 2020-08-29 DIAGNOSIS — J4520 Mild intermittent asthma, uncomplicated: Secondary | ICD-10-CM

## 2020-08-29 DIAGNOSIS — O352XX0 Maternal care for (suspected) hereditary disease in fetus, not applicable or unspecified: Secondary | ICD-10-CM

## 2020-08-29 DIAGNOSIS — D573 Sickle-cell trait: Secondary | ICD-10-CM

## 2020-08-29 DIAGNOSIS — R8271 Bacteriuria: Secondary | ICD-10-CM

## 2020-08-29 DIAGNOSIS — O3410 Maternal care for benign tumor of corpus uteri, unspecified trimester: Secondary | ICD-10-CM

## 2020-08-29 DIAGNOSIS — Z3A36 36 weeks gestation of pregnancy: Secondary | ICD-10-CM

## 2020-08-29 DIAGNOSIS — Z6841 Body Mass Index (BMI) 40.0 and over, adult: Secondary | ICD-10-CM

## 2020-08-29 NOTE — Progress Notes (Signed)
   PRENATAL VISIT NOTE  Subjective:  [name] is a 35 y.o. [phone] at [redacted]w[redacted]d being seen today for ongoing prenatal care.  She is currently monitored for the following issues for this high-risk pregnancy and has Sickle cell trait ([name]); Obesity in pregnancy; BMI 40.0-44.9, adult ([name]); Asthma, mild intermittent; Anemia in pregnancy; History of cesarean delivery, currently pregnant; Uterine fibroid in pregnancy; Paresthesia of both feet; Visual field scotoma of both eyes; Other headache syndrome; Anxiety; Supervision of high risk pregnancy, antepartum; Chronic hypertension affecting pregnancy; Type 2 diabetes mellitus in pregnancy; Uncontrolled diabetes mellitus ([name]); Proteinuria affecting pregnancy; GBS bacteriuria; and Alpha thalassemia silent carrier on their problem list.  Patient doing well with no acute concerns today. She reports nausea and vaginal lesion.  Contractions: Irregular. Vag. Bleeding: None.  Movement: Present. Denies leaking of fluid.   FBS:87-122 PPBS: 91-180  The following portions of the patient's history were reviewed and updated as appropriate: allergies, current medications, past family history, past medical history, past social history, past surgical history and problem list. Problem list updated.  Objective:   Vitals:   08/29/20 1054  BP: 125/77  Pulse: 96  Weight: 278 lb 4.8 oz (126.2 kg)    Fetal Status:     Movement: Present     General:  Alert, oriented and cooperative. Patient is in no acute distress.  Skin: Skin is warm and dry. No rash noted.   Cardiovascular: Normal heart rate noted  Respiratory: Normal respiratory effort, no problems with respiration noted  Abdomen: Soft, gravid, appropriate for gestational age.  Pain/Pressure: Present     Pelvic: Cervical exam performed Dilation: Fingertip Effacement (%): 50 Station: Ballotable  Extremities: Normal range of motion.  Edema: None  Mental Status:  Normal mood and affect. Normal behavior. Normal  judgment and thought content.  Checked vulva and did not see obvious HSV lesion, but pt is clear that it feels as if she is starting one Assessment and Plan:  Pregnancy: G5P3013 at [redacted]w[redacted]d  1. Supervision of high risk pregnancy, antepartum No GBS taken due to GBS in urine. - Culture, beta strep (group b only) - GC/Chlamydia probe amp ([place])not at [facility]  2. Chronic hypertension affecting pregnancy Normal BP  3. Mild intermittent asthma without complication   4. Pregnancy with type 2 diabetes mellitus in third trimester fastings slightly worse, pt notes some diet noncompliance, will schedule repeat c section at ? 37.4 weeks  5. Uterine fibroid in pregnancy   6. Sickle cell trait ([name])   7. Obesity in pregnancy   8. BMI 40.0-44.9, adult ([name])   9. Anemia during pregnancy in third trimester   10. 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 GBS bacteriuria   12. Alpha thalassemia silent carrier   13. Proteinuria affecting pregnancy in third trimester   Preterm labor symptoms and general obstetric precautions including but not limited to vaginal bleeding, contractions, leaking of fluid and fetal movement were reviewed in detail with the patient.  Please refer to After Visit Summary for other counseling recommendations.   Return in about 1 week (around 09/05/2020) for [facility], in person.   Lynnda Shields, MD

## 2020-08-30 ENCOUNTER — Other Ambulatory Visit: Payer: Self-pay | Admitting: Family Medicine

## 2020-08-30 LAB — GC/CHLAMYDIA PROBE AMP (~~LOC~~) NOT AT ARMC
Chlamydia: NEGATIVE
Comment: NEGATIVE
Comment: NORMAL
Neisseria Gonorrhea: NEGATIVE

## 2020-09-04 ENCOUNTER — Encounter (HOSPITAL_COMMUNITY): Payer: Self-pay

## 2020-09-04 NOTE — Patient Instructions (Addendum)
ANGIE PIERCEY  09/04/2020   Your procedure is scheduled on:  09/10/2020  Arrive at 45 at Entrance C on [address] at [facility]  and [address]. You are invited to use the FREE valet parking or use the Visitor's parking deck.  Pick up the phone at the desk and dial [phone].  Call this number if you have problems the morning of surgery: [phone]  Remember:   Do not eat food:(After Midnight) Desps de medianoche.  Do not drink clear liquids: (After Midnight) Desps de medianoche.  Take these medicines the morning of surgery with A SIP OF WATER:  Bring inhaler to the hospital.  Take valtrex as prescribed.  Use insulin pump as instructed by MD.  Plan is to stop insulin pump immediately before surgery.     Do not wear jewelry, make-up or nail polish.  Do not wear lotions, powders, or perfumes. Do not wear deodorant.  Do not shave 48 hours prior to surgery.  Do not bring valuables to the hospital.  [facility] is not   responsible for any belongings or valuables brought to the hospital.  Contacts, dentures or bridgework may not be worn into surgery.  Leave suitcase in the car. After surgery it may be brought to your room.  For patients admitted to the hospital, checkout time is 11:00 AM the day of              discharge.      Please read over the following fact sheets that you were given:     Preparing for Surgery

## 2020-09-05 ENCOUNTER — Telehealth: Payer: Medicaid Other | Admitting: Family Medicine

## 2020-09-05 ENCOUNTER — Other Ambulatory Visit: Payer: Self-pay

## 2020-09-05 ENCOUNTER — Encounter: Payer: Self-pay | Admitting: *Deleted

## 2020-09-05 ENCOUNTER — Ambulatory Visit: Payer: Medicaid Other | Attending: Obstetrics and Gynecology

## 2020-09-05 ENCOUNTER — Ambulatory Visit: Payer: Medicaid Other | Admitting: *Deleted

## 2020-09-05 DIAGNOSIS — Z148 Genetic carrier of other disease: Secondary | ICD-10-CM

## 2020-09-05 DIAGNOSIS — O24013 Pre-existing diabetes mellitus, type 1, in pregnancy, third trimester: Secondary | ICD-10-CM | POA: Diagnosis not present

## 2020-09-05 DIAGNOSIS — O0990 Supervision of high risk pregnancy, unspecified, unspecified trimester: Secondary | ICD-10-CM | POA: Insufficient documentation

## 2020-09-05 DIAGNOSIS — O3413 Maternal care for benign tumor of corpus uteri, third trimester: Secondary | ICD-10-CM

## 2020-09-05 DIAGNOSIS — O10913 Unspecified pre-existing hypertension complicating pregnancy, third trimester: Secondary | ICD-10-CM | POA: Insufficient documentation

## 2020-09-05 DIAGNOSIS — Z3A37 37 weeks gestation of pregnancy: Secondary | ICD-10-CM

## 2020-09-05 DIAGNOSIS — O10919 Unspecified pre-existing hypertension complicating pregnancy, unspecified trimester: Secondary | ICD-10-CM | POA: Insufficient documentation

## 2020-09-05 DIAGNOSIS — O99513 Diseases of the respiratory system complicating pregnancy, third trimester: Secondary | ICD-10-CM

## 2020-09-05 DIAGNOSIS — O99213 Obesity complicating pregnancy, third trimester: Secondary | ICD-10-CM

## 2020-09-05 DIAGNOSIS — O1213 Gestational proteinuria, third trimester: Secondary | ICD-10-CM

## 2020-09-05 DIAGNOSIS — Z362 Encounter for other antenatal screening follow-up: Secondary | ICD-10-CM | POA: Diagnosis not present

## 2020-09-05 DIAGNOSIS — O34219 Maternal care for unspecified type scar from previous cesarean delivery: Secondary | ICD-10-CM

## 2020-09-05 DIAGNOSIS — J45909 Unspecified asthma, uncomplicated: Secondary | ICD-10-CM

## 2020-09-06 ENCOUNTER — Telehealth (INDEPENDENT_AMBULATORY_CARE_PROVIDER_SITE_OTHER): Payer: Medicaid Other | Admitting: Family Medicine

## 2020-09-06 VITALS — BP 133/76 | HR 101 | Wt 275.0 lb

## 2020-09-06 DIAGNOSIS — E119 Type 2 diabetes mellitus without complications: Secondary | ICD-10-CM | POA: Diagnosis not present

## 2020-09-06 DIAGNOSIS — O98313 Other infections with a predominantly sexual mode of transmission complicating pregnancy, third trimester: Secondary | ICD-10-CM

## 2020-09-06 DIAGNOSIS — O10913 Unspecified pre-existing hypertension complicating pregnancy, third trimester: Secondary | ICD-10-CM

## 2020-09-06 DIAGNOSIS — J4520 Mild intermittent asthma, uncomplicated: Secondary | ICD-10-CM

## 2020-09-06 DIAGNOSIS — O99513 Diseases of the respiratory system complicating pregnancy, third trimester: Secondary | ICD-10-CM

## 2020-09-06 DIAGNOSIS — O24113 Pre-existing diabetes mellitus, type 2, in pregnancy, third trimester: Secondary | ICD-10-CM

## 2020-09-06 DIAGNOSIS — O34219 Maternal care for unspecified type scar from previous cesarean delivery: Secondary | ICD-10-CM

## 2020-09-06 DIAGNOSIS — A6004 Herpesviral vulvovaginitis: Secondary | ICD-10-CM

## 2020-09-06 DIAGNOSIS — Z3A37 37 weeks gestation of pregnancy: Secondary | ICD-10-CM

## 2020-09-06 DIAGNOSIS — O0990 Supervision of high risk pregnancy, unspecified, unspecified trimester: Secondary | ICD-10-CM

## 2020-09-06 DIAGNOSIS — O99891 Other specified diseases and conditions complicating pregnancy: Secondary | ICD-10-CM

## 2020-09-06 DIAGNOSIS — O10919 Unspecified pre-existing hypertension complicating pregnancy, unspecified trimester: Secondary | ICD-10-CM

## 2020-09-06 DIAGNOSIS — D259 Leiomyoma of uterus, unspecified: Secondary | ICD-10-CM

## 2020-09-06 DIAGNOSIS — O3413 Maternal care for benign tumor of corpus uteri, third trimester: Secondary | ICD-10-CM

## 2020-09-06 DIAGNOSIS — O24119 Pre-existing diabetes mellitus, type 2, in pregnancy, unspecified trimester: Secondary | ICD-10-CM

## 2020-09-06 DIAGNOSIS — R8271 Bacteriuria: Secondary | ICD-10-CM

## 2020-09-06 HISTORY — DX: Herpesviral vulvovaginitis: A60.04

## 2020-09-06 NOTE — Progress Notes (Signed)
TELEHEALTH OBSTETRICS VISIT ENCOUNTER NOTE  Provider location: [facility] for [facility]   I connected with Nunzio Cory on 09/06/20 at  8:35 AM EST by telephone at home and verified that I am speaking with the correct person using two identifiers. Of note, unable to do video encounter due to technical difficulties.    I discussed the limitations, risks, security and privacy concerns of performing an evaluation and management service by telephone and the availability of in person appointments. I also discussed with the patient that there may be a patient responsible charge related to this service. The patient expressed understanding and agreed to proceed.  Subjective:  [name] is a 35 y.o. [phone] at [redacted]w[redacted]d being followed for ongoing prenatal care.  She is currently monitored for the following issues for this high-risk pregnancy and has Sickle cell trait ([place]); Obesity in pregnancy; BMI 40.0-44.9, adult ([place]); Asthma, mild intermittent; Anemia in pregnancy; History of cesarean delivery, currently pregnant; Uterine fibroid in pregnancy; Paresthesia of both feet; Visual field scotoma of both eyes; Other headache syndrome; Anxiety; Supervision of high risk pregnancy, antepartum; Chronic hypertension affecting pregnancy; Type 2 diabetes mellitus in pregnancy; Uncontrolled diabetes mellitus ([place]); Proteinuria affecting pregnancy; GBS bacteriuria; Alpha thalassemia silent carrier; and Herpes simplex vulvovaginitis on their problem list.  Patient reports no complaints. Reports fetal movement. Denies any contractions, bleeding or leaking of fluid.   The following portions of the patient's history were reviewed and updated as appropriate: allergies, current medications, past family history, past medical history, past social history, past surgical history and problem list.   Objective:  Blood pressure 133/76, pulse (!) 101, weight 275 lb (124.7 kg), last menstrual  period 11/17/2019. General:  Alert, oriented and cooperative.   Mental Status: Normal mood and affect perceived. Normal judgment and thought content.  Rest of physical exam deferred due to type of encounter  Assessment and Plan:  Pregnancy: [id number] at [redacted]w[redacted]d 1. Supervision of high risk pregnancy, antepartum Good fetal movement  2. Chronic hypertension affecting pregnancy BP normal  3. Pregnancy with type 2 diabetes mellitus in third trimester Patient will leave omnipod on until AM and take it off in AM. Fastings fairly well controlled, as are her postprandial.  Growth normal  4. Mild intermittent asthma without complication  5. Uterine fibroid in pregnancy  6. History of cesarean delivery, currently pregnant Desires repeat c/s (some concerns of HSV outbreak) Discussed possible BTL at time of c/s. Patient to think about this. Would still want nexplanon for bleeding control.  7. GBS bacteriuria  8. Type 2 diabetes mellitus during pregnancy, antepartum  9. Herpes simplex vulvovaginitis On valtrex Had recent outbreak. Desires RLTCS for prevention.  Term labor symptoms and general obstetric precautions including but not limited to vaginal bleeding, contractions, leaking of fluid and fetal movement were reviewed in detail with the patient.  I discussed the assessment and treatment plan with the patient. The patient was provided an opportunity to ask questions and all were answered. The patient agreed with the plan and demonstrated an understanding of the instructions. The patient was advised to call back or seek an in-person office evaluation/go to [facility]U at [facility] for any urgent or concerning symptoms. Please refer to After Visit Summary for other counseling recommendations.   I provided 15 minutes of non-face-to-face time during this encounter.  No follow-ups on file.  Future Appointments  Date Time Provider [place]  09/09/2020  9:30 AM [facility]-LD PAT 1  [facility]-INDC None  09/09/2020 10:30 AM [facility]-SCREENING [facility]-SDSC None    [place] for [facility] [employer] [facility], [place]

## 2020-09-06 NOTE — Progress Notes (Signed)
I connected with  [name] on 09/06/20 at  8:35 AM EST by telephone and verified that I am speaking with the correct person using two identifiers.   I discussed the limitations, risks, security and privacy concerns of performing an evaluation and management service by telephone and the availability of in person appointments. I also discussed with the patient that there may be a patient responsible charge related to this service. The patient expressed understanding and agreed to proceed.  Derinda Late, RN 09/06/2020  8:32 AM

## 2020-09-09 ENCOUNTER — Other Ambulatory Visit (HOSPITAL_COMMUNITY)
Admission: RE | Admit: 2020-09-09 | Discharge: 2020-09-09 | Disposition: A | Discharge status: Home / Self Care | Payer: Medicaid Other | Source: Ambulatory Visit | Attending: Family Medicine | Admitting: Family Medicine

## 2020-09-09 ENCOUNTER — Encounter (HOSPITAL_COMMUNITY): Payer: Self-pay | Admitting: Family Medicine

## 2020-09-09 ENCOUNTER — Other Ambulatory Visit: Payer: Self-pay

## 2020-09-09 ENCOUNTER — Encounter (HOSPITAL_COMMUNITY)
Admission: RE | Admit: 2020-09-09 | Discharge: 2020-09-09 | Disposition: A | Discharge status: Home / Self Care | Payer: Medicaid Other | Source: Ambulatory Visit | Attending: Family Medicine | Admitting: Family Medicine

## 2020-09-09 DIAGNOSIS — Z01812 Encounter for preprocedural laboratory examination: Secondary | ICD-10-CM | POA: Insufficient documentation

## 2020-09-09 DIAGNOSIS — Z20822 Contact with and (suspected) exposure to covid-19: Secondary | ICD-10-CM | POA: Insufficient documentation

## 2020-09-09 LAB — RAPID HIV SCREEN (HIV 1/2 AB+AG)
HIV 1/2 Antibodies: NONREACTIVE
HIV-1 P24 Antigen - HIV24: NONREACTIVE
Interpretation (HIV Ag Ab): NONREACTIVE

## 2020-09-09 LAB — CBC
HCT: 31.4 % — ABNORMAL LOW (ref 36.0–46.0)
Hemoglobin: 9.7 g/dL — ABNORMAL LOW (ref 12.0–15.0)
MCH: 22.2 pg — ABNORMAL LOW (ref 26.0–34.0)
MCHC: 30.9 g/dL (ref 30.0–36.0)
MCV: 72 fL — ABNORMAL LOW (ref 80.0–100.0)
Platelets: 237 10*3/uL (ref 150–400)
RBC: 4.36 MIL/uL (ref 3.87–5.11)
RDW: 16.2 % — ABNORMAL HIGH (ref 11.5–15.5)
WBC: 5.7 10*3/uL (ref 4.0–10.5)
nRBC: 0 % (ref 0.0–0.2)

## 2020-09-09 LAB — TYPE AND SCREEN
ABO/RH(D): A POS
Antibody Screen: NEGATIVE

## 2020-09-09 NOTE — Anesthesia Preprocedure Evaluation (Addendum)
Anesthesia Evaluation  Patient identified by MRN, date of birth, ID band Patient awake    Reviewed: Allergy & Precautions, NPO status , Patient's Chart, lab work & pertinent test results  History of Anesthesia Complications Negative for: history of anesthetic complications  Airway Mallampati: II  TM Distance: >3 FB Neck ROM: Full    Dental  (+) Missing,    Pulmonary asthma , former smoker,    Pulmonary exam normal        Cardiovascular hypertension, Normal cardiovascular exam     Neuro/Psych  Headaches, Anxiety    GI/Hepatic negative GI ROS, Neg liver ROS,   Endo/Other  diabetes, Type 2, Insulin DependentMorbid obesity  Renal/GU negative Renal ROS  negative genitourinary   Musculoskeletal negative musculoskeletal ROS (+)   Abdominal   Peds  Hematology  (+) anemia , Hgb 9.7   Anesthesia Other Findings Day of surgery medications reviewed with patient.  Reproductive/Obstetrics (+) Pregnancy (hx of C/Sx1)                           Anesthesia Physical Anesthesia Plan  ASA: III  Anesthesia Plan: Spinal   Post-op Pain Management:    Induction:   PONV Risk Score and Plan: 4 or greater and Treatment may vary due to age or medical condition, Ondansetron and Dexamethasone  Airway Management Planned: Natural Airway  Additional Equipment: None  Intra-op Plan:   Post-operative Plan:   Informed Consent: I have reviewed the patients History and Physical, chart, labs and discussed the procedure including the risks, benefits and alternatives for the proposed anesthesia with the patient or authorized representative who has indicated his/her understanding and acceptance.       Plan Discussed with: CRNA  Anesthesia Plan Comments:        Anesthesia Quick Evaluation

## 2020-09-09 NOTE — Pre-Procedure Instructions (Signed)
Per Dr Royce Macadamia, pt is to continue insulin pump at basal rate until the start of surgery.

## 2020-09-10 ENCOUNTER — Inpatient Hospital Stay (HOSPITAL_COMMUNITY): Payer: Medicaid Other | Admitting: Anesthesiology

## 2020-09-10 ENCOUNTER — Inpatient Hospital Stay (HOSPITAL_COMMUNITY)
Admission: RE | Admit: 2020-09-10 | Discharge: 2020-09-13 | DRG: 783 | Disposition: A | Discharge status: Home / Self Care | Payer: Medicaid Other | Attending: Family Medicine | Admitting: Family Medicine

## 2020-09-10 ENCOUNTER — Other Ambulatory Visit: Payer: Self-pay

## 2020-09-10 ENCOUNTER — Encounter (HOSPITAL_COMMUNITY): Payer: Self-pay | Admitting: Family Medicine

## 2020-09-10 ENCOUNTER — Encounter (HOSPITAL_COMMUNITY)
Admission: RE | Disposition: A | Discharge status: Home / Self Care | Payer: Self-pay | Source: Home / Self Care | Attending: Family Medicine

## 2020-09-10 DIAGNOSIS — O99824 Streptococcus B carrier state complicating childbirth: Secondary | ICD-10-CM | POA: Diagnosis present

## 2020-09-10 DIAGNOSIS — O99019 Anemia complicating pregnancy, unspecified trimester: Secondary | ICD-10-CM | POA: Diagnosis present

## 2020-09-10 DIAGNOSIS — O403XX0 Polyhydramnios, third trimester, not applicable or unspecified: Secondary | ICD-10-CM | POA: Diagnosis present

## 2020-09-10 DIAGNOSIS — Z6841 Body Mass Index (BMI) 40.0 and over, adult: Secondary | ICD-10-CM

## 2020-09-10 DIAGNOSIS — A6004 Herpesviral vulvovaginitis: Secondary | ICD-10-CM | POA: Diagnosis present

## 2020-09-10 DIAGNOSIS — O9952 Diseases of the respiratory system complicating childbirth: Secondary | ICD-10-CM | POA: Diagnosis present

## 2020-09-10 DIAGNOSIS — O409XX0 Polyhydramnios, unspecified trimester, not applicable or unspecified: Secondary | ICD-10-CM | POA: Diagnosis present

## 2020-09-10 DIAGNOSIS — Z87891 Personal history of nicotine dependence: Secondary | ICD-10-CM

## 2020-09-10 DIAGNOSIS — Z20822 Contact with and (suspected) exposure to covid-19: Secondary | ICD-10-CM | POA: Diagnosis present

## 2020-09-10 DIAGNOSIS — O2412 Pre-existing diabetes mellitus, type 2, in childbirth: Secondary | ICD-10-CM | POA: Diagnosis present

## 2020-09-10 DIAGNOSIS — O26893 Other specified pregnancy related conditions, third trimester: Secondary | ICD-10-CM | POA: Diagnosis present

## 2020-09-10 DIAGNOSIS — O99214 Obesity complicating childbirth: Secondary | ICD-10-CM | POA: Diagnosis present

## 2020-09-10 DIAGNOSIS — Z3A37 37 weeks gestation of pregnancy: Secondary | ICD-10-CM

## 2020-09-10 DIAGNOSIS — A6000 Herpesviral infection of urogenital system, unspecified: Secondary | ICD-10-CM | POA: Diagnosis present

## 2020-09-10 DIAGNOSIS — D62 Acute posthemorrhagic anemia: Secondary | ICD-10-CM | POA: Diagnosis not present

## 2020-09-10 DIAGNOSIS — O9852 Other viral diseases complicating childbirth: Secondary | ICD-10-CM | POA: Diagnosis not present

## 2020-09-10 DIAGNOSIS — J4520 Mild intermittent asthma, uncomplicated: Secondary | ICD-10-CM | POA: Diagnosis present

## 2020-09-10 DIAGNOSIS — Z23 Encounter for immunization: Secondary | ICD-10-CM

## 2020-09-10 DIAGNOSIS — D573 Sickle-cell trait: Secondary | ICD-10-CM | POA: Diagnosis present

## 2020-09-10 DIAGNOSIS — E119 Type 2 diabetes mellitus without complications: Secondary | ICD-10-CM | POA: Diagnosis present

## 2020-09-10 DIAGNOSIS — Z8616 Personal history of COVID-19: Secondary | ICD-10-CM | POA: Diagnosis not present

## 2020-09-10 DIAGNOSIS — O9832 Other infections with a predominantly sexual mode of transmission complicating childbirth: Principal | ICD-10-CM | POA: Diagnosis present

## 2020-09-10 DIAGNOSIS — Z6791 Unspecified blood type, Rh negative: Secondary | ICD-10-CM | POA: Diagnosis not present

## 2020-09-10 DIAGNOSIS — D563 Thalassemia minor: Secondary | ICD-10-CM | POA: Diagnosis present

## 2020-09-10 DIAGNOSIS — R8271 Bacteriuria: Secondary | ICD-10-CM | POA: Diagnosis present

## 2020-09-10 DIAGNOSIS — B009 Herpesviral infection, unspecified: Secondary | ICD-10-CM | POA: Diagnosis not present

## 2020-09-10 DIAGNOSIS — O34219 Maternal care for unspecified type scar from previous cesarean delivery: Secondary | ICD-10-CM | POA: Diagnosis present

## 2020-09-10 DIAGNOSIS — O0990 Supervision of high risk pregnancy, unspecified, unspecified trimester: Secondary | ICD-10-CM

## 2020-09-10 DIAGNOSIS — O3413 Maternal care for benign tumor of corpus uteri, third trimester: Secondary | ICD-10-CM | POA: Diagnosis present

## 2020-09-10 DIAGNOSIS — O34211 Maternal care for low transverse scar from previous cesarean delivery: Secondary | ICD-10-CM | POA: Diagnosis present

## 2020-09-10 DIAGNOSIS — O9081 Anemia of the puerperium: Secondary | ICD-10-CM | POA: Diagnosis not present

## 2020-09-10 DIAGNOSIS — Z302 Encounter for sterilization: Secondary | ICD-10-CM | POA: Diagnosis not present

## 2020-09-10 DIAGNOSIS — D259 Leiomyoma of uterus, unspecified: Secondary | ICD-10-CM | POA: Diagnosis present

## 2020-09-10 DIAGNOSIS — Z9079 Acquired absence of other genital organ(s): Secondary | ICD-10-CM

## 2020-09-10 DIAGNOSIS — Z794 Long term (current) use of insulin: Secondary | ICD-10-CM

## 2020-09-10 DIAGNOSIS — F419 Anxiety disorder, unspecified: Secondary | ICD-10-CM | POA: Diagnosis present

## 2020-09-10 DIAGNOSIS — O1002 Pre-existing essential hypertension complicating childbirth: Secondary | ICD-10-CM | POA: Diagnosis present

## 2020-09-10 HISTORY — PX: TUBAL LIGATION: SHX77

## 2020-09-10 LAB — HEMOGLOBIN A1C
Hgb A1c MFr Bld: 7.3 % — ABNORMAL HIGH (ref 4.8–5.6)
Mean Plasma Glucose: 162.81 mg/dL

## 2020-09-10 LAB — GLUCOSE, CAPILLARY
Glucose-Capillary: 102 mg/dL — ABNORMAL HIGH (ref 70–99)
Glucose-Capillary: 90 mg/dL (ref 70–99)
Glucose-Capillary: 150 mg/dL — ABNORMAL HIGH (ref 70–99)
Glucose-Capillary: 133 mg/dL — ABNORMAL HIGH (ref 70–99)
Glucose-Capillary: 148 mg/dL — ABNORMAL HIGH (ref 70–99)

## 2020-09-10 LAB — RPR: RPR Ser Ql: NONREACTIVE

## 2020-09-10 LAB — SARS CORONAVIRUS 2 (TAT 6-24 HRS): SARS Coronavirus 2: NEGATIVE

## 2020-09-10 SURGERY — Surgical Case
Anesthesia: Spinal

## 2020-09-10 MED ORDER — DEXTROSE 5 % IV SOLN
3.0000 g | INTRAVENOUS | Status: AC
  Administered 2020-09-10: 3 g via INTRAVENOUS

## 2020-09-10 MED ORDER — ONDANSETRON HCL 4 MG/2ML IJ SOLN: 4.0000 mg | Freq: Three times a day (TID) | INTRAMUSCULAR | Status: DC | PRN

## 2020-09-10 MED ORDER — DIPHENHYDRAMINE HCL 25 MG PO CAPS: 25.0000 mg | ORAL_CAPSULE | Freq: Four times a day (QID) | ORAL | Status: DC | PRN

## 2020-09-10 MED ORDER — ACETAMINOPHEN 160 MG/5ML PO SOLN: 1000.0000 mg | Freq: Once | ORAL | Status: DC

## 2020-09-10 MED ORDER — OXYTOCIN-SODIUM CHLORIDE 30-0.9 UT/500ML-% IV SOLN: 2.5000 [IU]/h | INTRAVENOUS | Status: AC

## 2020-09-10 MED ORDER — PHENYLEPHRINE 40 MCG/ML (10ML) SYRINGE FOR IV PUSH (FOR BLOOD PRESSURE SUPPORT)
PREFILLED_SYRINGE | INTRAVENOUS | Status: AC
  Filled 2020-09-10: qty 10

## 2020-09-10 MED ORDER — PHENYLEPHRINE HCL-NACL 20-0.9 MG/250ML-% IV SOLN
INTRAVENOUS | Status: AC
  Filled 2020-09-10: qty 250

## 2020-09-10 MED ORDER — OXYTOCIN-SODIUM CHLORIDE 30-0.9 UT/500ML-% IV SOLN
INTRAVENOUS | Status: AC
  Filled 2020-09-10: qty 500

## 2020-09-10 MED ORDER — NALBUPHINE HCL 10 MG/ML IJ SOLN: 5.0000 mg | INTRAMUSCULAR | Status: DC | PRN

## 2020-09-10 MED ORDER — IBUPROFEN 800 MG PO TABS
800.0000 mg | ORAL_TABLET | Freq: Four times a day (QID) | ORAL | Status: DC
  Administered 2020-09-11 – 2020-09-13 (×7): 800 mg via ORAL
  Filled 2020-09-10 (×7): qty 1

## 2020-09-10 MED ORDER — COVID-19 MRNA VACC (MODERNA) 100 MCG/0.5ML IM SUSP
0.5000 mL | Freq: Once | INTRAMUSCULAR | Status: AC
  Administered 2020-09-13: 0.5 mL via INTRAMUSCULAR
  Filled 2020-09-10 (×2): qty 0.5

## 2020-09-10 MED ORDER — SIMETHICONE 80 MG PO CHEW
80.0000 mg | CHEWABLE_TABLET | Freq: Three times a day (TID) | ORAL | Status: DC
  Administered 2020-09-10 – 2020-09-13 (×7): 80 mg via ORAL
  Filled 2020-09-10 (×8): qty 1

## 2020-09-10 MED ORDER — VALACYCLOVIR HCL 500 MG PO TABS
1000.0000 mg | ORAL_TABLET | Freq: Every day | ORAL | Status: DC
  Administered 2020-09-11 – 2020-09-13 (×3): 1000 mg via ORAL
  Filled 2020-09-10 (×3): qty 2

## 2020-09-10 MED ORDER — LACTATED RINGERS IV SOLN: 125.0000 mL/h | INTRAVENOUS | Status: DC

## 2020-09-10 MED ORDER — NALBUPHINE HCL 10 MG/ML IJ SOLN
5.0000 mg | INTRAMUSCULAR | Status: DC | PRN
Start: 2020-09-10 — End: 2020-09-13

## 2020-09-10 MED ORDER — METFORMIN HCL 500 MG PO TABS
1000.0000 mg | ORAL_TABLET | Freq: Two times a day (BID) | ORAL | Status: DC
  Administered 2020-09-10 – 2020-09-13 (×6): 1000 mg via ORAL
  Filled 2020-09-10 (×6): qty 2

## 2020-09-10 MED ORDER — PHENYLEPHRINE HCL-NACL 20-0.9 MG/250ML-% IV SOLN
INTRAVENOUS | Status: DC | PRN
  Administered 2020-09-10: 60 ug/min via INTRAVENOUS

## 2020-09-10 MED ORDER — NALOXONE HCL 4 MG/10ML IJ SOLN
1.0000 ug/kg/h | INTRAVENOUS | Status: DC | PRN
  Filled 2020-09-10: qty 5

## 2020-09-10 MED ORDER — NALBUPHINE HCL 10 MG/ML IJ SOLN: 5.0000 mg | Freq: Once | INTRAMUSCULAR | Status: DC | PRN

## 2020-09-10 MED ORDER — SENNOSIDES-DOCUSATE SODIUM 8.6-50 MG PO TABS
2.0000 | ORAL_TABLET | ORAL | Status: DC
  Administered 2020-09-11 – 2020-09-12 (×2): 2 via ORAL
  Filled 2020-09-10 (×2): qty 2

## 2020-09-10 MED ORDER — PHENYLEPHRINE 40 MCG/ML (10ML) SYRINGE FOR IV PUSH (FOR BLOOD PRESSURE SUPPORT)
PREFILLED_SYRINGE | INTRAVENOUS | Status: DC | PRN
  Administered 2020-09-10 (×2): 40 ug via INTRAVENOUS

## 2020-09-10 MED ORDER — LACTATED RINGERS IV SOLN: INTRAVENOUS | Status: DC

## 2020-09-10 MED ORDER — COCONUT OIL OIL: 1.0000 "application " | TOPICAL_OIL | Status: DC | PRN

## 2020-09-10 MED ORDER — DEXAMETHASONE SODIUM PHOSPHATE 10 MG/ML IJ SOLN
INTRAMUSCULAR | Status: DC | PRN
  Administered 2020-09-10: 10 mg via INTRAVENOUS

## 2020-09-10 MED ORDER — FENTANYL CITRATE (PF) 100 MCG/2ML IJ SOLN: 25.0000 ug | INTRAMUSCULAR | Status: DC | PRN

## 2020-09-10 MED ORDER — POVIDONE-IODINE 10 % EX SWAB
2.0000 "application " | Freq: Once | CUTANEOUS | Status: AC
  Administered 2020-09-10: 2 via TOPICAL

## 2020-09-10 MED ORDER — ACETAMINOPHEN 325 MG PO TABS
650.0000 mg | ORAL_TABLET | ORAL | Status: DC | PRN
  Administered 2020-09-11: 650 mg via ORAL
  Filled 2020-09-10: qty 2

## 2020-09-10 MED ORDER — PRENATAL MULTIVITAMIN CH
1.0000 | ORAL_TABLET | Freq: Every day | ORAL | Status: DC
  Administered 2020-09-11 – 2020-09-13 (×3): 1 via ORAL
  Filled 2020-09-10 (×3): qty 1

## 2020-09-10 MED ORDER — ONDANSETRON HCL 4 MG/2ML IJ SOLN
INTRAMUSCULAR | Status: DC | PRN
  Administered 2020-09-10: 4 mg via INTRAVENOUS

## 2020-09-10 MED ORDER — ACETAMINOPHEN 500 MG PO TABS: 1000.0000 mg | ORAL_TABLET | Freq: Once | ORAL | Status: DC

## 2020-09-10 MED ORDER — ACETAMINOPHEN 500 MG PO TABS
1000.0000 mg | ORAL_TABLET | Freq: Four times a day (QID) | ORAL | Status: AC
  Administered 2020-09-10 – 2020-09-11 (×3): 1000 mg via ORAL
  Filled 2020-09-10 (×3): qty 2

## 2020-09-10 MED ORDER — DIBUCAINE (PERIANAL) 1 % EX OINT
1.0000 | TOPICAL_OINTMENT | CUTANEOUS | Status: DC | PRN
Start: 2020-09-10 — End: 2020-09-13

## 2020-09-10 MED ORDER — INSULIN ASPART 100 UNIT/ML ~~LOC~~ SOLN
0.0000 [IU] | Freq: Three times a day (TID) | SUBCUTANEOUS | Status: DC
  Administered 2020-09-10: 2 [IU] via SUBCUTANEOUS
  Administered 2020-09-11 (×2): 3 [IU] via SUBCUTANEOUS

## 2020-09-10 MED ORDER — KETOROLAC TROMETHAMINE 30 MG/ML IJ SOLN: 30.0000 mg | Freq: Four times a day (QID) | INTRAMUSCULAR | Status: DC | PRN

## 2020-09-10 MED ORDER — MEASLES, MUMPS & RUBELLA VAC IJ SOLR
0.5000 mL | Freq: Once | INTRAMUSCULAR | Status: AC
  Administered 2020-09-11: 0.5 mL via SUBCUTANEOUS
  Filled 2020-09-10: qty 0.5

## 2020-09-10 MED ORDER — SIMETHICONE 80 MG PO CHEW: 80.0000 mg | CHEWABLE_TABLET | ORAL | Status: DC | PRN

## 2020-09-10 MED ORDER — VITAMIN C 250 MG PO TABS
250.0000 mg | ORAL_TABLET | ORAL | Status: DC
  Administered 2020-09-12: 250 mg via ORAL
  Filled 2020-09-10 (×2): qty 1

## 2020-09-10 MED ORDER — TETANUS-DIPHTH-ACELL PERTUSSIS 5-2.5-18.5 LF-MCG/0.5 IM SUSY: 0.5000 mL | PREFILLED_SYRINGE | Freq: Once | INTRAMUSCULAR | Status: DC

## 2020-09-10 MED ORDER — OXYTOCIN-SODIUM CHLORIDE 30-0.9 UT/500ML-% IV SOLN
INTRAVENOUS | Status: DC | PRN
  Administered 2020-09-10: 30 [IU] via INTRAVENOUS

## 2020-09-10 MED ORDER — FENTANYL CITRATE (PF) 100 MCG/2ML IJ SOLN
INTRAMUSCULAR | Status: DC | PRN
  Administered 2020-09-10: 15 ug via INTRATHECAL

## 2020-09-10 MED ORDER — ENOXAPARIN SODIUM 60 MG/0.6ML ~~LOC~~ SOLN
60.0000 mg | SUBCUTANEOUS | Status: DC
  Administered 2020-09-11 – 2020-09-13 (×3): 60 mg via SUBCUTANEOUS
  Filled 2020-09-10 (×3): qty 0.6

## 2020-09-10 MED ORDER — NALOXONE HCL 0.4 MG/ML IJ SOLN
0.4000 mg | INTRAMUSCULAR | Status: DC | PRN
Start: 2020-09-10 — End: 2020-09-13

## 2020-09-10 MED ORDER — KETOROLAC TROMETHAMINE 30 MG/ML IJ SOLN
30.0000 mg | Freq: Four times a day (QID) | INTRAMUSCULAR | Status: AC
  Administered 2020-09-10 – 2020-09-11 (×3): 30 mg via INTRAVENOUS
  Filled 2020-09-10 (×3): qty 1

## 2020-09-10 MED ORDER — FERROUS SULFATE 325 (65 FE) MG PO TABS
325.0000 mg | ORAL_TABLET | ORAL | Status: DC
  Administered 2020-09-12: 325 mg via ORAL
  Filled 2020-09-10: qty 1

## 2020-09-10 MED ORDER — DEXTROSE 5 % IV SOLN
INTRAVENOUS | Status: AC
  Filled 2020-09-10: qty 3000

## 2020-09-10 MED ORDER — ONDANSETRON HCL 4 MG/2ML IJ SOLN
INTRAMUSCULAR | Status: AC
  Filled 2020-09-10: qty 2

## 2020-09-10 MED ORDER — MORPHINE SULFATE (PF) 0.5 MG/ML IJ SOLN
INTRAMUSCULAR | Status: AC
  Filled 2020-09-10: qty 10

## 2020-09-10 MED ORDER — FENTANYL CITRATE (PF) 100 MCG/2ML IJ SOLN
INTRAMUSCULAR | Status: AC
  Filled 2020-09-10: qty 2

## 2020-09-10 MED ORDER — KETOROLAC TROMETHAMINE 30 MG/ML IJ SOLN
30.0000 mg | Freq: Once | INTRAMUSCULAR | Status: AC
  Administered 2020-09-10: 30 mg via INTRAVENOUS

## 2020-09-10 MED ORDER — PROMETHAZINE HCL 25 MG/ML IJ SOLN: 6.2500 mg | INTRAMUSCULAR | Status: DC | PRN

## 2020-09-10 MED ORDER — MENTHOL 3 MG MT LOZG: 1.0000 | LOZENGE | OROMUCOSAL | Status: DC | PRN

## 2020-09-10 MED ORDER — SODIUM CHLORIDE 0.9% FLUSH: 3.0000 mL | INTRAVENOUS | Status: DC | PRN

## 2020-09-10 MED ORDER — DEXAMETHASONE SODIUM PHOSPHATE 10 MG/ML IJ SOLN
INTRAMUSCULAR | Status: AC
  Filled 2020-09-10: qty 1

## 2020-09-10 MED ORDER — INSULIN ASPART 100 UNIT/ML ~~LOC~~ SOLN: 0.0000 [IU] | Freq: Every day | SUBCUTANEOUS | Status: DC

## 2020-09-10 MED ORDER — OXYCODONE HCL 5 MG PO TABS
5.0000 mg | ORAL_TABLET | ORAL | Status: DC | PRN
  Administered 2020-09-11 – 2020-09-12 (×2): 5 mg via ORAL
  Filled 2020-09-10 (×2): qty 1

## 2020-09-10 MED ORDER — KETOROLAC TROMETHAMINE 30 MG/ML IJ SOLN
INTRAMUSCULAR | Status: AC
  Filled 2020-09-10: qty 1

## 2020-09-10 MED ORDER — DIPHENHYDRAMINE HCL 50 MG/ML IJ SOLN: 12.5000 mg | Freq: Four times a day (QID) | INTRAMUSCULAR | Status: DC | PRN

## 2020-09-10 MED ORDER — WITCH HAZEL-GLYCERIN EX PADS
1.0000 "application " | MEDICATED_PAD | CUTANEOUS | Status: DC | PRN
  Administered 2020-09-13: 1 via TOPICAL

## 2020-09-10 MED ORDER — KETOROLAC TROMETHAMINE 30 MG/ML IJ SOLN
30.0000 mg | Freq: Once | INTRAMUSCULAR | Status: DC
  Filled 2020-09-10: qty 1

## 2020-09-10 MED ORDER — MORPHINE SULFATE (PF) 0.5 MG/ML IJ SOLN
INTRAMUSCULAR | Status: DC | PRN
  Administered 2020-09-10: .15 mg via INTRATHECAL

## 2020-09-10 MED ORDER — BUPIVACAINE IN DEXTROSE 0.75-8.25 % IT SOLN
INTRATHECAL | Status: DC | PRN
  Administered 2020-09-10: 1.8 mL via INTRATHECAL

## 2020-09-10 SURGICAL SUPPLY — 34 items
BENZOIN TINCTURE PRP APPL 2/3 (GAUZE/BANDAGES/DRESSINGS) ×3 IMPLANT
CHLORAPREP W/TINT 26ML (MISCELLANEOUS) ×3 IMPLANT
CLAMP CORD UMBIL (MISCELLANEOUS) IMPLANT
CLOTH BEACON ORANGE TIMEOUT ST (SAFETY) ×3 IMPLANT
DRSG OPSITE POSTOP 4X10 (GAUZE/BANDAGES/DRESSINGS) ×3 IMPLANT
ELECT REM PT RETURN 9FT ADLT (ELECTROSURGICAL) ×3
ELECTRODE REM PT RTRN 9FT ADLT (ELECTROSURGICAL) ×2 IMPLANT
EXTRACTOR VACUUM M CUP 4 TUBE (SUCTIONS) IMPLANT
GLOVE BIOGEL PI IND STRL 7.0 (GLOVE) ×4 IMPLANT
GLOVE BIOGEL PI IND STRL 7.5 (GLOVE) ×4 IMPLANT
GLOVE BIOGEL PI INDICATOR 7.0 (GLOVE) ×2
GLOVE BIOGEL PI INDICATOR 7.5 (GLOVE) ×2
GLOVE ECLIPSE 7.5 STRL STRAW (GLOVE) ×3 IMPLANT
GOWN STRL REUS W/TWL LRG LVL3 (GOWN DISPOSABLE) ×9 IMPLANT
KIT ABG SYR 3ML LUER SLIP (SYRINGE) IMPLANT
NEEDLE HYPO 25X5/8 SAFETYGLIDE (NEEDLE) IMPLANT
NS IRRIG 1000ML POUR BTL (IV SOLUTION) ×3 IMPLANT
PACK C SECTION WH (CUSTOM PROCEDURE TRAY) ×3 IMPLANT
PAD OB MATERNITY 4.3X12.25 (PERSONAL CARE ITEMS) ×3 IMPLANT
PENCIL SMOKE EVAC W/HOLSTER (ELECTROSURGICAL) ×3 IMPLANT
RETRACTOR TRAXI PANNICULUS (MISCELLANEOUS) ×2 IMPLANT
RTRCTR C-SECT PINK 25CM LRG (MISCELLANEOUS) ×3 IMPLANT
STRIP CLOSURE SKIN 1/2X4 (GAUZE/BANDAGES/DRESSINGS) ×3 IMPLANT
SUT PLAIN 2 0 (SUTURE) ×3
SUT PLAIN ABS 2-0 CT1 27XMFL (SUTURE) ×6 IMPLANT
SUT VIC AB 0 CTX 36 (SUTURE) ×3
SUT VIC AB 0 CTX36XBRD ANBCTRL (SUTURE) ×6 IMPLANT
SUT VIC AB 2-0 CT1 27 (SUTURE) ×1
SUT VIC AB 2-0 CT1 TAPERPNT 27 (SUTURE) ×2 IMPLANT
SUT VIC AB 4-0 KS 27 (SUTURE) ×3 IMPLANT
TOWEL OR 17X24 6PK STRL BLUE (TOWEL DISPOSABLE) ×3 IMPLANT
TRAXI PANNICULUS RETRACTOR (MISCELLANEOUS) ×1
TRAY FOLEY W/BAG SLVR 14FR LF (SET/KITS/TRAYS/PACK) ×3 IMPLANT
WATER STERILE IRR 1000ML POUR (IV SOLUTION) ×3 IMPLANT

## 2020-09-10 NOTE — Anesthesia Postprocedure Evaluation (Signed)
Anesthesia Post Note  Patient: JANESHA BRISSETTE  Procedure(s) Performed: CESAREAN SECTION (N/A ) BILATERAL TUBAL LIGATION     Patient location during evaluation: PACU Anesthesia Type: Spinal Level of consciousness: awake and alert and oriented Pain management: pain level controlled Vital Signs Assessment: post-procedure vital signs reviewed and stable Respiratory status: spontaneous breathing, nonlabored ventilation and respiratory function stable Cardiovascular status: blood pressure returned to baseline Postop Assessment: no apparent nausea or vomiting, spinal receding, no headache and no backache Anesthetic complications: no   No complications documented.  Last Vitals:  Vitals:   09/10/20 1200 09/10/20 1217  BP: 114/76 123/70  Pulse: 85 83  Resp: (!) 28   Temp: 36.6 C 36.7 C  SpO2: 100% 100%    Last Pain:  Vitals:   09/10/20 0808  TempSrc: Oral   Pain Goal:    LLE Motor Response: Purposeful movement (09/10/20 1200)   RLE Motor Response: Purposeful movement (09/10/20 1200)       Epidural/Spinal Function Cutaneous sensation: Able to Discern Pressure (09/10/20 1200), Patient able to flex knees: Yes (09/10/20 1200), Patient able to lift hips off bed: No (09/10/20 1200), Back pain beyond tenderness at insertion site: No (09/10/20 1200), Progressively worsening motor and/or sensory loss: No (09/10/20 1200), Bowel and/or bladder incontinence post epidural: No (09/10/20 1200)  Brennan Bailey

## 2020-09-10 NOTE — Lactation Note (Signed)
This note was copied from a baby's chart. Lactation Consultation Note  Patient Name: [name] [id number] Date: 09/10/2020   Age:34 hours  Attempted to visit with mom, but RN Vania Rea was working with her and asked LC to come back at a later time. LC will come back later tonight to do initial assessment. Baby is currently in the NICU.  Maternal Data    Feeding    LATCH Score                    Lactation Tools Discussed/Used    Interventions    Discharge    Consult Status      [name] [name] 09/10/2020, 6:34 PM

## 2020-09-10 NOTE — Lactation Note (Signed)
This note was copied from a baby's chart. Lactation Consultation Note  Patient Name: [name] [id number] Date: 09/10/2020 Reason for consult: Initial assessment;Maternal endocrine disorder;NICU baby;Early term 76-38.6wks Age:34 hours  Visited with mom of 17 hours old ETI NICU female, she's a P4 but not very experienced BF. She reported lack of support with her previous babies as her main obstacle, she wasn't aware of all the BF support that is available now a days (her last baby is 55 y.o).  LC reviewed hand expression with mom and she was able to get droplets of colostrum, praised her for her efforts. RN had already set up a DEBP but mom hasn't started pumping yet. LC assisted with pumping, she started pumping during [facility] consultation, praised her for her efforts. Reviewed pumping schedule, breastmilk storage guidelines and lactogenesis II.  Feeding plan  1. Encouraged mom to pump every 2-3 hours, at least 8 pumping sessions/24 hours 2. Hand expression and breast massage were also encouraged prior pumping  BF brochure, BF resources and NICU booklet were reviewed. No support person in mom's room at the time of [facility] consultation. Mom reported all questions and concerns were answered, she's aware of [facility] OP services and will call PRN.   Maternal Data Has patient been taught Hand Expression?: Yes Does the patient have breastfeeding experience prior to this delivery?: Yes How long did the patient breastfeed?: 1 month  Feeding Mother's Current Feeding Choice: Breast Milk and Formula  LATCH Score    Lactation Tools Discussed/Used Tools: Pump;Flanges Flange Size: 24 Breast pump type: Double-Electric Breast Pump Pump Education: Setup, frequency, and cleaning;Milk Storage Reason for Pumping: ETI in NICU Pumping frequency: q 2-3 hours  Interventions Interventions: Breast feeding basics reviewed;Breast massage;Hand express;DEBP  Discharge Pump: DEBP;Personal (DEBP at home) [facility] Program:  Yes  Consult Status Consult Status: Follow-up Date: 09/11/20 Follow-up type: In-patient    [name] 09/10/2020, 7:27 PM

## 2020-09-10 NOTE — Progress Notes (Signed)
Inpatient Diabetes Program Recommendations  AACE/ADA: New Consensus Statement on Inpatient Glycemic Control (2015)  Target Ranges:  Prepandial:   less than 140 mg/dL      Peak postprandial:   less than 180 mg/dL (1-2 hours)      Critically ill patients:  140 - 180 mg/dL   Lab Results  Component Value Date   GLUCAP 102 (H) 09/10/2020   HGBA1C 6.8 (H) 07/04/2020    Review of Glycemic Control Results for [name], [name] (MRN [id number]) as of 09/10/2020 09:24  Ref. Range 09/10/2020 08:16  Glucose-Capillary Latest Ref Range: 70 - 99 mg/dL 102 (H)   Diabetes history: Type 2 DM Outpatient Diabetes medications: Omnipod/Novolog; inpt doses from 06/2020: Novolog 15 units TID, NPH 39 units BID Current orders for Inpatient glycemic control: none, intra-op  Inpatient Diabetes Program Recommendations:    Noted plan for Metformin post partum.  Given A1C on 02/20/2020 was 13.9% would anticipate need for insulin.   Could also consider adding Novolog 0-15 units TID & HS and NPH 8 units BID (to start tonight).   Thanks, Bronson Curb, MSN, RNC-OB Diabetes Coordinator [phone] (8a-5p)

## 2020-09-10 NOTE — Plan of Care (Signed)
  Problem: Education: Goal: Knowledge of General Education information will improve Description: Including pain rating scale, medication(s)/side effects and non-pharmacologic comfort measures 09/10/2020 2135 by Harriett Sine, RN Outcome: Progressing 09/10/2020 2134 by Harriett Sine, RN Outcome: Progressing   Problem: Health Behavior/Discharge Planning: Goal: Ability to manage health-related needs will improve 09/10/2020 2135 by Harriett Sine, RN Outcome: Progressing 09/10/2020 2134 by Harriett Sine, RN Outcome: Progressing   Problem: Clinical Measurements: Goal: Ability to maintain clinical measurements within normal limits will improve 09/10/2020 2135 by Harriett Sine, RN Outcome: Progressing 09/10/2020 2134 by Harriett Sine, RN Outcome: Progressing Goal: Respiratory complications will improve Outcome: Progressing Goal: Cardiovascular complication will be avoided Outcome: Progressing   Problem: Activity: Goal: Risk for activity intolerance will decrease Outcome: Progressing   Problem: Coping: Goal: Level of anxiety will decrease Outcome: Progressing   Problem: Education: Goal: Knowledge of condition will improve Outcome: Progressing

## 2020-09-10 NOTE — Anesthesia Procedure Notes (Signed)
Spinal  Patient location during procedure: OR Start time: 09/10/2020 9:51 AM End time: 09/10/2020 9:55 AM Staffing Performed: anesthesiologist  Anesthesiologist: Brennan Bailey, MD Preanesthetic Checklist Completed: patient identified, IV checked, risks and benefits discussed, monitors and equipment checked, pre-op evaluation and timeout performed Spinal Block Patient position: sitting Prep: DuraPrep and site prepped and draped Patient monitoring: heart rate, continuous pulse ox and blood pressure Approach: midline Location: L3-4 Injection technique: single-shot Needle Needle type: Pencan  Needle gauge: 24 G Needle length: 10 cm Assessment Sensory level: T4 Additional Notes Risks, benefits, and alternative discussed. Patient gave consent to procedure. Prepped and draped in sitting position. Clear CSF obtained after two needle redirections. Positive terminal aspiration. No pain or paraesthesias with injection. Patient tolerated procedure well. Vital signs stable. Tawny Asal, MD

## 2020-09-10 NOTE — Op Note (Signed)
Nunzio Cory PROCEDURE DATE: 09/10/2020  PREOPERATIVE DIAGNOSIS: Intrauterine pregnancy at  [redacted]w[redacted]d weeks gestation; active herpes outbreak, elective repeat cesarean, desire for permanent sterilization  POSTOPERATIVE DIAGNOSIS: The same  PROCEDURE: repeat Low Transverse Cesarean Section  SURGEON:  Dr. Loma Boston  ASSISTANT: Dr. Corliss Blacker  INDICATIONS: [name] is a 35 y.o. [phone] at [redacted]w[redacted]d scheduled for cesarean section secondary to elective repeat in the setting of active herpes outbreak.  The risks of cesarean section discussed with the patient included but were not limited to: bleeding which may require transfusion or reoperation; infection which may require antibiotics; injury to bowel, bladder, ureters or other surrounding organs; injury to the fetus; need for additional procedures including hysterectomy in the event of a life-threatening hemorrhage; placental abnormalities wth subsequent pregnancies, incisional problems, thromboembolic phenomenon and other postoperative/anesthesia complications. Patient also desires permanent sterilization.  Other reversible forms of contraception were discussed with patient; she declines all other modalities. Risks of procedure discussed with patient including but not limited to: risk of regret, permanence of method, bleeding, infection, injury to surrounding organs and need for additional procedures.  Failure risk of about 1% with increased risk of ectopic gestation if pregnancy occurs was also discussed with patient.  Also discussed possibility of post-tubal pain syndrome.The patient concurred with the proposed plan, giving informed written consent for the procedure.    FINDINGS:  Viable female infant in cephalic presentation.  Apgars 8 and 9, weight pending. Clear amniotic fluid.  Intact placenta, three vessel cord.  Normal uterus, fallopian tubes and ovaries bilaterally.  ANESTHESIA:    Spinal INTRAVENOUS FLUIDS: 2200 ml ESTIMATED  BLOOD LOSS: 137 ml URINE OUTPUT:  200  ml SPECIMENS: Placenta sent to L&D, bilateral fallopian tubes to pathology COMPLICATIONS: None immediate  PROCEDURE IN DETAIL:  The patient received intravenous antibiotics and had sequential compression devices applied to her lower extremities while in the preoperative area.  She was then taken to the operating room where spinal anesthesia was administered and was found to be adequate. She was then placed in a dorsal supine position with a leftward tilt, and prepped and draped in a sterile manner.  A foley catheter was placed into her bladder and attached to constant gravity, which drained clear fluid throughout.  After an adequate timeout was performed, a Pfannenstiel skin incision was made with scalpel and carried through to the underlying layer of fascia. The fascia was incised in the midline and this incision was extended bilaterally using the Mayo scissors. Kocher clamps were applied to the superior aspect of the fascial incision and the underlying rectus muscles were dissected off bluntly and sharply. A similar process was carried out on the inferior aspect of the facial incision. The rectus muscles were separated in the midline bluntly and the peritoneum was entered bluntly and spread with kelly clamp. An Alexis retractor was placed to aid in visualization of the uterus.  Attention was turned to the lower uterine segment where a transverse hysterotomy was made with a scalpel and extended bilaterally bluntly. The infant was successfully delivered, and cord was clamped and cut and infant was handed over to awaiting neonatology team. Uterine massage was then administered and the placenta delivered intact with three-vessel cord. The uterus was then cleared of clot and debris.  The hysterotomy was closed with 0 Vicryl in a running locked fashion, and an imbricating layer was also placed with a 0 Vicryl. Overall, excellent hemostasis was noted.   Attention was then  turned to the right  fallopian tube. Kelly forceps x2 were placed on the mesosalpinx underneath most of the tube.  The tube was cut above the kelly clamps with metzenbaum scissors. This pedicle was ligated with chromic gut. Hemostasis was noted. The left fallopian tube was then identified, and a kelly clamp was placed on the mesosalpinx underneath most of the tube. The tube was cut above the kelly clamp with Metzenbaum scissors. The tube was then doubly ligated, allowing for bilateral tubal sterilization via bilateral salpingectomy.  Good hemostasis was noted overall.  The abdomen and the pelvis were cleared of all clot and debris and the Ubaldo Glassing was removed. Hemostasis was confirmed on all surfaces.  The peritoneum was reapproximated using 2-0 vicryl running stitches. The fascia was then closed using 0 Vicryl in a running fashion. The subcutaneous layer was reapproximated with plain gut and the skin was closed with 4-0 vicryl. The patient tolerated the procedure well. Sponge, lap, instrument and needle counts were correct x 2. She was taken to the recovery room in stable condition.    Arrie Senate, MD 12/10/1026 11:08 AM

## 2020-09-10 NOTE — H&P (Signed)
OBSTETRIC ADMISSION HISTORY AND PHYSICAL  [name] is a 35 y.o. female [phone] with IUP at [redacted]w[redacted]d by early u/s presenting for scheduled rLTCS in the setting of active HSV outbreak. She reports +FMs, No LOF, no VB, no blurry vision, headaches or peripheral edema, and RUQ pain.  She plans on breast feeding. She request BTL for birth control if possible, if not elects for IP nexplanon. 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 Asthma   . COVID-19 07/04/2020   05/04/20  . Diabetes mellitus without complication ([place])   . DKA (diabetic ketoacidoses) 08/27/2018  . Family history of diabetes mellitus in mother 01/10/2018  . 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 approx 3-4cm at time of 01/2016 c-section  . GBS (group B streptococcus) infection   . Hearing loss    pt states she was tested in high school and told it was selective she says she continues to have a deficit and does not feel it is selective  . Herpes   . Hypertension   . Newly diagnosed diabetes ([place]) 01/10/2018   Diagnosed 01/10/18  A1c 7.5%  . Trichomonas vaginitis     Past Surgical History: Past Surgical History:  Procedure Laterality Date  . CESAREAN SECTION N/A 01/08/2016   Procedure: CESAREAN SECTION;  Surgeon: Aletha Halim, MD;  Location: [place];  Service: Obstetrics;  Laterality: N/A;  . DILATION AND CURETTAGE OF UTERUS     . WISDOM TOOTH EXTRACTION      Obstetrical History: OB History    Gravida  5   Para  3   Term  3   Preterm  0   AB  1   Living  3     SAB  1   IAB  0   Ectopic  0   Multiple  0   Live Births  3           Social History Social History   Socioeconomic History  . Marital status: Legally Separated    Spouse name: Not on file  . Number of children: Not on file  . Years of education: Not on file  . Highest education level: Not on file  Occupational History  . Not on file  Tobacco Use  . Smoking status: Former Smoker    Types: Cigarettes  . Smokeless tobacco: Never Used  . Tobacco comment: quit 2013  Vaping Use  . Vaping Use: Never used  Substance and Sexual Activity  . Alcohol use: No  . Drug use: No  . Sexual activity: Yes    Birth control/protection: None  Other Topics Concern  . Not on file  Social History Narrative  . Not on file   Social Determinants of Health   Financial Resource Strain: Not on file  Food Insecurity: Food Insecurity Present  . Worried About Charity fundraiser in the Last Year: Sometimes true  . Ran Out of Food in the Last Year:  Sometimes true  Transportation Needs: No Transportation Needs  . Lack of Transportation (Medical): No  . Lack of Transportation (Non-Medical): No  Physical Activity: Not on file  Stress: Not on file  Social Connections: Not on file    Family History: Family History  Problem Relation Age of Onset  . Diabetes Mother   . Hypertension Mother   . Hyperlipidemia Mother   . Heart disease Mother   . Breast cancer Mother        74  . Ovarian cancer Mother        Unknown age  . Diabetes Sister   . Diabetes Brother   . Breast cancer Cousin   . Heart disease Maternal Grandmother     Allergies: No Known Allergies  Medications Prior to Admission  Medication Sig Dispense Refill Last Dose  . acetaminophen (TYLENOL) 500 MG tablet Take 1,000 mg by mouth every 8 (eight) hours as needed for  moderate pain.   Past Month at Unknown time  . albuterol (VENTOLIN HFA) 108 (90 Base) MCG/ACT inhaler Inhale 2 puffs into the lungs every 6 (six) hours as needed for wheezing or shortness of breath. 1 each 1 Past Month at Unknown time  . ferrous sulfate (FERROUSUL) 325 (65 FE) MG tablet Take 1 tablet (325 mg total) by mouth daily with breakfast. Take with vitamin C 60 tablet 1   . insulin aspart (NOVOLOG) 100 UNIT/ML injection For use on Omnipod with continuous dosing. Basal rate 1.8 units per hour. Bolus of 11 units per meal and 5 units for snacks. 40 mL 6   . Insulin Disposable Pump (OMNIPOD 5 PACK) MISC 1 Device by Does not apply route every other day. 3 each 6   . Insulin Syringe-Needle U-100 (SAFETY INSULIN SYRINGES) 30G X 1/2" 1 ML MISC 1 Device by Does not apply route in the morning, at noon, in the evening, and at bedtime. 100 each 1 09/09/2020 at Unknown time  . Prenatal MV-Min-FA-Omega-3 (PRENATAL GUMMIES/DHA & FA) 0.4-32.5 MG CHEW Chew 1 tablet by mouth daily. 30 tablet 11 09/09/2020 at Unknown time  . valACYclovir (VALTREX) 500 MG tablet Take 2 tablets (1,000 mg total) by mouth daily. Take this dose for prevention of future outbreak after treatment dose is completed. 30 tablet 0 Past Month at Unknown time     Review of Systems   All systems reviewed and negative except as stated in HPI  Blood pressure 124/86, pulse (!) 112, temperature 98.2 F (36.8 C), temperature source Oral, resp. rate 18, height $RemoveBe'5\' 8"'vhWNvuKkb$  (1.727 m), weight 125 kg, last menstrual period 11/17/2019, SpO2 100 %. General appearance: alert, cooperative and no distress Lungs: normal respiratory effort Heart: regular rate and rhythm Abdomen: soft, non-tender; gravid Pelvic: not indicated Extremities: Homans sign is negative, no sign of DVT   Prenatal labs: ABO, Rh: --/--/A POS (02/07 7673) Antibody: NEG (02/07 0954) Rubella: <0.90 (07/29 1426) RPR: Non Reactive (12/02 1034)  HBsAg: Negative (07/29 1426)  HIV: NON  REACTIVE (02/07 0953)  GBS:   positive urine 1 hr Glucola not done, known T2DM, last a1c 07/04/20 6.8 Genetic screening silent alpha thal and sicle cell carrier Anatomy US normal  Prenatal Transfer Tool  Maternal Diabetes: Yes:  Diabetes Type:  Pre-pregnancy Genetic Screening: Abnormal:  Results: Other:as noted above Maternal Ultrasounds/Referrals: Normal Fetal Ultrasounds or other Referrals:  Fetal echo, Referred to Materal Fetal Medicine  Maternal Substance Abuse:  No Significant Maternal Medications:  Meds include: Other: valtrex, omnipod Significant Maternal Lab Results: Group  B Strep positive and Rh negative  Results for orders placed or performed during the hospital encounter of 09/10/20 (from the past 24 hour(s))  Glucose, capillary   Collection Time: 09/10/20  8:16 AM  Result Value Ref Range   Glucose-Capillary 102 (H) 70 - 99 mg/dL  Results for orders placed or performed during the hospital encounter of 09/09/20 (from the past 24 hour(s))  SARS CORONAVIRUS 2 (TAT 6-24 HRS) Nasopharyngeal Nasopharyngeal Swab   Collection Time: 09/09/20 10:46 AM   Specimen: Nasopharyngeal Swab  Result Value Ref Range   SARS Coronavirus 2 NEGATIVE NEGATIVE  Results for orders placed or performed during the hospital encounter of 09/09/20 (from the past 24 hour(s))  CBC   Collection Time: 09/09/20  9:53 AM  Result Value Ref Range   WBC 5.7 4.0 - 10.5 K/uL   RBC 4.36 3.87 - 5.11 MIL/uL   Hemoglobin 9.7 (L) 12.0 - 15.0 g/dL   HCT 31.4 (L) 36.0 - 46.0 %   MCV 72.0 (L) 80.0 - 100.0 fL   MCH 22.2 (L) 26.0 - 34.0 pg   MCHC 30.9 30.0 - 36.0 g/dL   RDW 16.2 (H) 11.5 - 15.5 %   Platelets 237 150 - 400 K/uL   nRBC 0.0 0.0 - 0.2 %  Rapid HIV screen (HIV 1/2 Ab+Ag)   Collection Time: 09/09/20  9:53 AM  Result Value Ref Range   HIV-1 P24 Antigen - HIV24 NON REACTIVE NON REACTIVE   HIV 1/2 Antibodies NON REACTIVE NON REACTIVE   Interpretation (HIV Ag Ab) NON REACTIVE   Type and screen   Collection  Time: 09/09/20  9:54 AM  Result Value Ref Range   ABO/RH(D) A POS    Antibody Screen NEG    Sample Expiration      09/12/2020,2359 Performed at [facility], [address], [place],  [address]     Patient Active Problem List   Diagnosis Date Noted  . Herpes simplex vulvovaginitis 09/06/2020  . Alpha thalassemia silent carrier 03/22/2020  . GBS bacteriuria 03/08/2020  . Proteinuria affecting pregnancy 03/01/2020  . Uncontrolled diabetes mellitus ([place]) 02/29/2020  . Supervision of high risk pregnancy, antepartum 02/21/2020  . Chronic hypertension affecting pregnancy 02/21/2020  . Type 2 diabetes mellitus in pregnancy 02/21/2020  . Anxiety 08/27/2018  . Uterine fibroid in pregnancy 01/10/2018  . Paresthesia of both feet 01/10/2018  . Visual field scotoma of both eyes 01/10/2018  . Other headache syndrome 01/10/2018  . History of cesarean delivery, currently pregnant 01/08/2016  . Anemia in pregnancy 10/21/2015  . Asthma, mild intermittent 08/14/2015  . Obesity in pregnancy   . Sickle cell trait ([place]) 09/01/2014  . BMI 40.0-44.9, adult ([place]) 04/28/2012    Assessment/Plan:  [name] is a 35 y.o. [phone] at [redacted]w[redacted]d here for scheduled rLTCS in the setting of active HSV outbreak.  #rLTCS/BTL if possible  The risks of cesarean section were discussed with the patient including but were not limited to: bleeding which may require transfusion or reoperation; infection which may require antibiotics; injury to bowel, bladder, ureters or other surrounding organs; injury to the fetus; need for additional procedures including hysterectomy in the event of a life-threatening hemorrhage; placental abnormalities wth subsequent pregnancies, incisional problems, thromboembolic phenomenon and other postoperative/anesthesia complications.  Patient also desires permanent sterilization.  Other reversible forms of contraception were discussed with patient; she declines all other  modalities. Risks of procedure discussed with patient including but not limited to: risk of regret, permanence of  method, bleeding, infection, injury to surrounding organs and need for additional procedures.  Failure risk of about 1% with increased risk of ectopic gestation if pregnancy occurs was also discussed with patient.  Also discussed possibility of post-tubal pain syndrome. The patient concurred with the proposed plan, giving informed written consent for the procedures.  Patient has been NPO since 0000 she will remain NPO for procedure. Anesthesia and OR aware.  Preoperative prophylactic antibiotics and SCDs ordered on call to the OR.  To OR when ready.  #Pain: spinal #ID: GBS +, no PCN given mechanism of delivery. Intra-op ancef for surgical prophylaxis. #MOF: breast #MOC: BTL if able, if not IP nexplanon  #T2DM: metformin postpartum, will likely also need insulin.  #cHTN: monitor BP  #Rubella NI: MMR postpartum  #HSV: treat will valtrex postpartum in the setting of current outbreak.  #Anemia of pregnancy: pre-op hgb 9.7, postpartum IV vs PO iron pending EBL.  Arrie Senate, MD  09/10/2020, 9:08 AM

## 2020-09-10 NOTE — Transfer of Care (Signed)
Immediate Anesthesia Transfer of Care Note  Patient: [name]  Procedure(s) Performed: CESAREAN SECTION (N/A ) BILATERAL TUBAL LIGATION  Patient Location: PACU  Anesthesia Type:Spinal  Level of Consciousness: awake  Airway & Oxygen Therapy: Patient Spontanous Breathing  Post-op Assessment: Report given to RN and Post -op Vital signs reviewed and stable  Post vital signs: Reviewed and stable  Last Vitals:  Vitals Value Taken Time  BP 123/72 09/10/20 1115  Temp 36.9 C 09/10/20 1108  Pulse 87 09/10/20 1127  Resp 21 09/10/20 1127  SpO2 100 % 09/10/20 1127  Vitals shown include unvalidated device data.  Last Pain:  Vitals:   09/10/20 0808  TempSrc: Oral         Complications: No complications documented.

## 2020-09-10 NOTE — Discharge Summary (Signed)
Postpartum Discharge Summary     Patient Name: [name] DOB: 1986-03-13 MRN: [id number]  Date of admission: 09/10/2020 Delivery date:09/10/2020  Delivering provider: Truett Mainland  Date of discharge: 09/13/2020  Admitting diagnosis: Cesarean delivery delivered [O82] Intrauterine pregnancy: [redacted]w[redacted]d     Secondary diagnosis:  Active Problems:   Sickle cell trait (HCC)   BMI 40.0-44.9, adult (HCC)   Asthma, mild intermittent   Anemia of mother in pregnancy, postpartum condition   History of cesarean delivery, currently pregnant   Uterine fibroid in pregnancy   Anxiety   Supervision of high risk pregnancy, antepartum   GBS bacteriuria   Alpha thalassemia silent carrier   Herpes simplex vulvovaginitis   Polyhydramnios   Cesarean delivery delivered   History of bilateral salpingectomy  Additional problems: none    Discharge diagnosis: Term Pregnancy Delivered, CHTN, Type 2 DM and Anemia                                              Post partum procedures:postpartum tubal ligation Augmentation: N/A Complications: None  Hospital course: Sceduled C/S   35 y.o. yo [id number] at [redacted]w[redacted]d was admitted to the hospital 09/10/2020 for scheduled cesarean section with the following indication:Elective Repeat in the setting of active genital herpes infection.Delivery details are as follows:  Membrane Rupture Time/Date: 10:17 AM ,09/10/2020   Delivery Method:C-Section, Low Transverse  Details of operation can be found in separate operative note.  Patient had a postpartum course significant for acute blood loss anemia.  She received Venofer x1.  She was begun on Metformin 1000 mg twice daily and she had decent CBG control.  She did receive [place] Covid vaccine at discharge.  On the day of discharge her blood pressures were creeping up and were in the 140s over 90 range.  For this reason she was begun on Procardia 30 mg XL daily at discharge.  Her meds were all [facility] and were delivered to her room prior to discharge.  She is ambulating, tolerating a regular diet, passing flatus, and urinating well. Patient is discharged home in stable condition on  09/13/20        Newborn Data: Birth date:09/10/2020  Birth time:10:18 AM  Gender:Female  Living status:Living  Apgars:8 ,9  Weight:4225 g     Magnesium Sulfate received: No BMZ received: No Rhophylac:N/A MMR:Yes T-DaP:Given prenatally Flu: Yes Transfusion:No  Physical exam  Vitals:   09/13/20 0602 09/13/20 0607 09/13/20 0723 09/13/20 1102  BP: (!) 144/85 123/76 126/78 128/67  Pulse: 81 85 87 87  Resp: $Remo'18  18 19  'rvPzW$ Temp:   98 F (36.7 C) 98.1 F (36.7 C)  TempSrc:   Oral Oral  SpO2:   100% 100%  Weight:      Height:       General: alert, cooperative and no distress Lochia: appropriate Uterine Fundus: firm Incision: Healing well with no significant drainage DVT Evaluation: No evidence of DVT seen on physical exam. Labs: Lab Results  Component Value Date   WBC 12.9 (H) 09/11/2020   HGB 9.2 (L) 09/11/2020   HCT 30.2 (L) 09/11/2020   MCV 72.6 (L) 09/11/2020   PLT 246 09/11/2020   CMP Latest Ref Rng & Units 03/15/2020  Glucose 65 - 99 mg/dL 165(H)  BUN 6 - 20 mg/dL 4(L)  Creatinine 0.57 - 1.00 mg/dL 0.63  Sodium 134 - 144 mmol/L 134  Potassium 3.5 - 5.2 mmol/L 4.2  Chloride 96 - 106 mmol/L 99  CO2 20 - 29 mmol/L 23  Calcium 8.7 - 10.2 mg/dL 8.9  Total Protein 6.5 - 8.1 g/dL -  Total Bilirubin 0.3 - 1.2 mg/dL -  Alkaline Phos 38 - 126 U/L -  AST 15 - 41 U/L -  ALT 0 - 44 U/L -   Edinburgh Score: Edinburgh Postnatal Depression Scale Screening Tool 09/11/2020  I have been able to laugh and see the funny side of things. 0  I have looked forward with enjoyment to things. 0  I have blamed myself unnecessarily when things went wrong. 2  I have been anxious or worried for no good reason. 0  I have felt scared or panicky for no good reason. 1  Things have been getting on top of  me. 1  I have been so unhappy that I have had difficulty sleeping. 1  I have felt sad or miserable. 1  I have been so unhappy that I have been crying. 1  The thought of harming myself has occurred to me. 0  Edinburgh Postnatal Depression Scale Total 7     After visit meds:  Allergies as of 09/13/2020   No Known Allergies     Medication List    STOP taking these medications   insulin aspart 100 UNIT/ML injection Commonly known as: novoLOG     TAKE these medications   acetaminophen 500 MG tablet Commonly known as: TYLENOL Take 1,000 mg by mouth every 8 (eight) hours as needed for moderate pain.   albuterol 108 (90 Base) MCG/ACT inhaler Commonly known as: VENTOLIN HFA Inhale 2 puffs into the lungs every 6 (six) hours as needed for wheezing or shortness of breath.   ferrous sulfate 325 (65 FE) MG tablet Commonly known as: FerrouSul Take 1 tablet (325 mg total) by mouth daily with breakfast. Take with vitamin C What changed: Another medication with the same name was added. Make sure you understand how and when to take each.   ferrous sulfate 325 (65 FE) MG tablet Take 1 tablet (325 mg total) by mouth every other day. Start taking on: September 14, 2020 What changed: You were already taking a medication with the same name, and this prescription was added. Make sure you understand how and when to take each.   metFORMIN 1000 MG tablet Commonly known as: GLUCOPHAGE Take 1 tablet (1,000 mg total) by mouth 2 (two) times daily with a meal.   NIFEdipine 30 MG 24 hr tablet Commonly known as: Procardia XL Take 1 tablet (30 mg total) by mouth daily.   OmniPod 5 Pack Misc 1 Device by Does not apply route every other day.   oxyCODONE-acetaminophen 5-325 MG tablet Commonly known as: PERCOCET/ROXICET Take 1-2 tablets by mouth every 6 (six) hours as needed.   Prenatal Gummies/DHA & FA 0.4-32.5 MG Chew Chew 1 tablet by mouth daily.   Safety Insulin Syringes 30G X 1/2" 1 ML  Misc Generic drug: Insulin Syringe-Needle U-100 1 Device by Does not apply route in the morning, at noon, in the evening, and at bedtime.   valACYclovir 1000 MG tablet Commonly known as: Valtrex Take 0.5 tablets (500 mg total) by mouth daily. If outbreak increase to 2 tablets daily x 5 days What changed:   medication strength  how much to take  additional instructions  Discharge home in stable condition Infant Feeding: Breast Infant Disposition:home with mother Discharge instruction: per After Visit Summary and Postpartum booklet. Activity: Advance as tolerated. Pelvic rest for 6 weeks.  Diet: carb modified diet Future Appointments: Future Appointments  Date Time Provider [place]  09/17/2020  9:20 AM [facility] NURSE [facility]  10/11/2020  8:35 AM Aletha Halim, MD [facility]   Follow up Visit:  [place] [facility] Follow up in 1 week(s).   Specialty: Obstetrics and Gynecology Contact information: [place] [phone]             Message sent to [facility] 09/10/20 by Sylvester Harder.   Please schedule this patient for a In person postpartum visit in 4 weeks with the following provider: Any provider. Additional Postpartum F/U:Incision check 1 week and BP check 1 week  High risk pregnancy complicated by: GDM and HTN Delivery mode:  C-Section, Low Transverse  Anticipated Birth Control:  BTL done [facility]   09/13/2020 Donnamae Jude, MD

## 2020-09-11 ENCOUNTER — Encounter (HOSPITAL_COMMUNITY): Payer: Self-pay | Admitting: Family Medicine

## 2020-09-11 LAB — CBC
HCT: 30.2 % — ABNORMAL LOW (ref 36.0–46.0)
Hemoglobin: 9.2 g/dL — ABNORMAL LOW (ref 12.0–15.0)
MCH: 22.1 pg — ABNORMAL LOW (ref 26.0–34.0)
MCHC: 30.5 g/dL (ref 30.0–36.0)
MCV: 72.6 fL — ABNORMAL LOW (ref 80.0–100.0)
Platelets: 246 10*3/uL (ref 150–400)
RBC: 4.16 MIL/uL (ref 3.87–5.11)
RDW: 16.2 % — ABNORMAL HIGH (ref 11.5–15.5)
WBC: 12.9 10*3/uL — ABNORMAL HIGH (ref 4.0–10.5)
nRBC: 0 % (ref 0.0–0.2)

## 2020-09-11 LAB — GLUCOSE, CAPILLARY
Glucose-Capillary: 101 mg/dL — ABNORMAL HIGH (ref 70–99)
Glucose-Capillary: 116 mg/dL — ABNORMAL HIGH (ref 70–99)
Glucose-Capillary: 151 mg/dL — ABNORMAL HIGH (ref 70–99)
Glucose-Capillary: 166 mg/dL — ABNORMAL HIGH (ref 70–99)

## 2020-09-11 NOTE — Progress Notes (Signed)
Subjective: Postpartum Day 1: Cesarean Delivery Patient reports feeling well without some incisional pain. She is ambulating, voiding and tolerating a regular diet. She denies chest pain, shortness of breath, or lightheadedness    Objective: Vital signs in last 24 hours: Temp:  [97.6 F (36.4 C)-98.4 F (36.9 C)] 97.8 F (36.6 C) (02/09 0446) Pulse Rate:  [78-112] 78 (02/09 0446) Resp:  [15-30] 16 (02/09 0446) BP: (99-125)/(56-86) 99/63 (02/09 0446) SpO2:  [99 %-100 %] 100 % (02/09 0446) Weight:  [943 kg] 125 kg (02/08 2003)  Physical Exam:  General: alert, cooperative and no distress Lochia: appropriate Uterine Fundus: firm Incision: pressure dressing in place as patient was reluctant to lay back down in bed after just sitting up for breakfast DVT Evaluation: No evidence of DVT seen on physical exam. No cords or calf tenderness.  Recent Labs    09/09/20 0953 09/11/20 0450  HGB 9.7* 9.2*  HCT 31.4* 30.2*    Assessment/Plan: Status post Cesarean section. Doing well postoperatively.  CBG this am of 171 not true fasting. Currently on metformin and SS Awaiting diabetic coordinator recommnedation. Continue routine postpartum care  [name] 09/11/2020, 7:44 AM

## 2020-09-11 NOTE — Plan of Care (Signed)
  Problem: Clinical Measurements: Goal: Ability to maintain clinical measurements within normal limits will improve Outcome: Progressing   Problem: Activity: Goal: Risk for activity intolerance will decrease Outcome: Progressing   Problem: Nutrition: Goal: Adequate nutrition will be maintained Outcome: Progressing   Problem: Coping: Goal: Level of anxiety will decrease Outcome: Progressing   Problem: Pain Managment: Goal: General experience of comfort will improve Outcome: Progressing   Problem: Education: Goal: Knowledge of condition will improve Outcome: Progressing   Problem: Activity: Goal: Will verbalize the importance of balancing activity with adequate rest periods Outcome: Progressing Goal: Ability to tolerate increased activity will improve Outcome: Progressing   Problem: Life Cycle: Goal: Chance of risk for complications during the postpartum period will decrease Outcome: Progressing   Problem: Role Relationship: Goal: Ability to demonstrate positive interaction with newborn will improve Outcome: Progressing

## 2020-09-11 NOTE — Lactation Note (Signed)
This note was copied from a baby's chart. Lactation Consultation Note  Patient Name: [name] [id number] Date: 09/11/2020 Reason for consult: Follow-up assessment;NICU baby Age:34 hours     Feeding Mother's Current Feeding Choice: Breast Milk and Formula   Consult with mother holding infant in lap. Mother states she is concerned about her milk supply because when she pumped last (yesterday) she only expressed drops.  Provided education about milk supply, stimulation and stressed the importance of pumping/hand expressing at least 8 x a day.     Lactation Tools Discussed/Used Tools: Pump Breast pump type: Double-Electric Breast Pump Pump Education: Milk Storage Reason for Pumping: NICU Pumping frequency: q 3 hours  Interventions Interventions: Education;DEBP  Discharge Pump: Personal (unsure of brand)  Consult Status Consult Status: Follow-up Date: 09/12/20 Follow-up type: In-patient    [name] [facility] 09/11/2020, 11:01 AM

## 2020-09-11 NOTE — Progress Notes (Addendum)
Inpatient Diabetes Program Recommendations  AACE/ADA: New Consensus Statement on Inpatient Glycemic Control (2015)  Target Ranges:  Prepandial:   less than 140 mg/dL      Peak postprandial:   less than 180 mg/dL (1-2 hours)      Critically ill patients:  140 - 180 mg/dL   Lab Results  Component Value Date   GLUCAP 151 (H) 09/11/2020   HGBA1C 7.3 (H) 09/10/2020    Review of Glycemic Control Results for [name], [name] (MRN [id number]) as of 09/11/2020 08:46  Ref. Range 09/10/2020 14:49 09/10/2020 17:41 09/10/2020 22:28 09/11/2020 07:02  Glucose-Capillary Latest Ref Range: 70 - 99 mg/dL 150 (H) 133 (H) 148 (H) 151 (H)    Diabetes history: Type 2 DM Outpatient Diabetes medications: Omnipod/Novolog; inpt doses from 06/2020: Novolog 15 units TID, NPH 39 units BID Current orders for Inpatient glycemic control: Novolog 0-15 units TID, Novolog 0-5 units QHS, Metformin 1000 mg BID Decadron 10 mg x 1 intra-op on 2/8  Inpatient Diabetes Program Recommendations:    Noted plan for Metformin post partum.  Given A1C on 02/20/2020 was 13.9% would anticipate need for insulin.   Addendum: Spoke with patient regarding inpatient insulin needs and increase may be result of Decadron 10 mg intra-op. Reviewed previous A1C prior to pregnancy of >13% with great improvement to 7.3%. Reviewed recommended frequency for continued monitoring postpartum and the need to see [facility] for follow up on diabetes.  Discussed possible need for insulin vs oral agents at time of discharge. At this time, patient has no further questions. Information attached to DC summary.   Thanks, Bronson Curb, MSN, RNC-OB Diabetes Coordinator [phone] (8a-5p)

## 2020-09-11 NOTE — Lactation Note (Signed)
This note was copied from a baby's chart. Lactation Consultation Note  Patient Name: [name] [id number] Date: 09/11/2020 Reason for consult: Follow-up assessment;NICU baby Age:34 hours  Follow up to P4 mother of 1 hours old infant currently in the NICU. Infant has 2.01% weight loss at the time of visit.  Mother states she has not pump since yesterday or hand expressing. LC reinforced the importance of stimulating breasts. Discussed milk supply at this point is consistent with infant gestational age. Mother is planning to bring pump parts to infant's room to use pump at bedside. Reinforced the importance of pumping 8-12 times in 24h.  Promoted self-care as part of mother's healing process.  LC encouraged mother  to call for assistance with pumping or any other needs.    All questions answered at this time.   Feeding Mother's Current Feeding Choice: Breast Milk and Formula  Lactation Tools Discussed/Used Tools: Pump Breast pump type: Double-Electric Breast Pump Reason for Pumping: Martenal infant separation Pumping frequency: 8-12 x 24h  Interventions Interventions: DEBP;Expressed milk;Breast feeding basics reviewed  Consult Status Consult Status: Follow-up Date: 09/12/20 Follow-up type: In-patient    [name] 09/11/2020, 4:54 PM

## 2020-09-12 LAB — GLUCOSE, CAPILLARY
Glucose-Capillary: 93 mg/dL (ref 70–99)
Glucose-Capillary: 79 mg/dL (ref 70–99)
Glucose-Capillary: 90 mg/dL (ref 70–99)
Glucose-Capillary: 163 mg/dL — ABNORMAL HIGH (ref 70–99)

## 2020-09-12 LAB — SURGICAL PATHOLOGY

## 2020-09-12 MED ORDER — SODIUM CHLORIDE 0.9 % IV SOLN
500.0000 mg | Freq: Once | INTRAVENOUS | Status: AC
  Administered 2020-09-12: 500 mg via INTRAVENOUS
  Filled 2020-09-12: qty 25

## 2020-09-12 NOTE — Plan of Care (Signed)
  Problem: Pain Managment: Goal: General experience of comfort will improve Outcome: Progressing   Problem: Education: Goal: Knowledge of condition will improve Outcome: Progressing   Problem: Activity: Goal: Will verbalize the importance of balancing activity with adequate rest periods Outcome: Progressing Goal: Ability to tolerate increased activity will improve Outcome: Progressing   Problem: Life Cycle: Goal: Chance of risk for complications during the postpartum period will decrease Outcome: Progressing   Problem: Role Relationship: Goal: Ability to demonstrate positive interaction with newborn will improve Outcome: Progressing   Problem: Skin Integrity: Goal: Demonstration of wound healing without infection will improve Outcome: Progressing

## 2020-09-12 NOTE — Clinical Social Work Maternal (Signed)
CLINICAL SOCIAL WORK MATERNAL/CHILD NOTE  Patient Details  Name: [name] MRN: [id number] Date of Birth: [birth year]  Date:  09/12/2020  Clinical Social Worker Initiating Note:  Abundio Miu, [place] Date/Time: Initiated:  09/12/20/1241     Child's Name:  [name]   Biological Parents:  Mother,Father (Father: [name])   Need for Interpreter:  None   Reason for Referral:  [place] (Comment) (NICU admission)   Address:  [address] [place] [address]    Phone number:  [phone] (home)     Additional phone number:   Household Members/Support Persons (HM/SP):   Household Member/Support Person 1,Household Member/Support Person 2,Household Member/Support Person 3   HM/SP Name Relationship DOB or Age  HM/SP -1 Tommas Olp daughter 11/02/12  HM/SP -2 Nia Moore daughter 03/05/15  HM/SP -3 [name] Moore daughter 01/08/16  HM/SP -4        HM/SP -5        HM/SP -6        HM/SP -7        HM/SP -8          Natural Supports (not living in the home):  Immediate Family,Spouse/significant other   Professional Supports: Therapist   Employment: Unemployed   Type of Work:     Education:  [place] arranged:    Museum/gallery curator Resources:  [employer]   Other Resources:  [place] Considerations Which May Impact Care:    Strengths:  Ability to meet basic needs ,Pediatrician chosen,Home prepared for child ,Understanding of illness   Psychotropic Medications:         Pediatrician:    [place]  Pediatrician List:   [facility] ([address])  [place]      Pediatrician Fax Number:    Risk Factors/Current Problems:  Mental Health Concerns    Cognitive State:  Able to Concentrate ,Alert ,Insightful ,Goal Oriented ,Linear Thinking    Mood/Affect:   Calm ,Interested ,Comfortable    CSW Assessment: CSW introduced self and explained reason for visit. MOB was welcoming, pleasant, open and remained engaged during assessment. MOB reported that she resides with her three older children. MOB reported that she receives both [facility] and food stamps. MOB reported that she has all the items needed to care for infant including car seat and crib. CSW inquired about MOB's support system, MOB reported that her sister and fiance are supports.   CSW inquired about MOB's mental health history. MOB reported that she was diagnosed with depression in 2019. MOB reported that her depression was situational and denied any current symptoms. MOB shared that she is currently in therapy at [facility], noting it is helpful. CSW inquired about MOB's coping skills, MOB reported that she likes to sketch, write and have a positive attitude. CSW positively affirmed MOB's healthy coping skills. 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 MOB reported that she had postpartum depression with her last pregnancy in 2017. MOB attributed her postpartum depression to relationship issues and reported that it lasted for about a month. CSW inquired about how MOB was feeling emotionally after giving birth, MOB reported that she was excited. MOB spoke about infant's NICU admission and birth certificate issues being frustrating. MOB became tearful. CSW acknowledged and validated MOB's experience and feelings surrounding the  experience. CSW encouraged MOB to try to have a positive perspective knowing that both situations were temporary. MOB agreed. MOB presented calm and did not demonstrate any acute mental health signs/symptoms. CSW assessed for safety, MOB denied SI, HI and domestic violence.   CSW provided education regarding the baby blues period vs. perinatal mood disorders, discussed treatment and gave resources for mental health follow up if concerns arise.  CSW recommends  self-evaluation during the postpartum time period using the New Mom Checklist from Postpartum Progress and encouraged MOB to contact a medical professional if symptoms are noted at any time.    CSW provided review of Sudden Infant Death Syndrome (SIDS) precautions.    CSW and MOB discussed infant's NICU admission, what to expect and resources/supports available while infant is admitted to the NICU. MOB reported that she feels well informed about infant's care. MOB is hopeful that infant will not have to stay in the NICU [facility]ie [facility]. MOB reported that meal vouchers would be helpful. CSW agreed to leave at infant's bedside. CSW inquired about any transportation barriers to visiting infant in the NICU. MOB reported that gas may become a concern. CSW agreed to check in with MOB tomorrow to see if any gas resources are needed. MOB denied any questions/concerns regarding the NICU.   CSW will continue to offer resources/supports while infant is admitted to the NICU.    CSW Plan/Description:  Sudden Infant Death Syndrome (SIDS) Education,Perinatal Mood and Anxiety Disorder (PMADs) Education,Other Patient/Family Education,Other Information/Referral to [employer] and Ongoing Assessment of Needs    Burnis Medin, LCSW 09/12/2020, 12:43 PM

## 2020-09-12 NOTE — Progress Notes (Signed)
Postpartum Day 2: Cesarean Delivery Subjective: Patient reports feeling well without some incisional pain. She is ambulating, voiding and tolerating a regular diet. She denies chest pain, shortness of breath, or lightheadedness. Passing flatus.   Objective: Vital signs in last 24 hours: Temp:  [97.8 F (36.6 C)-98.2 F (36.8 C)] 98.1 F (36.7 C) (02/10 0454) Pulse Rate:  [85-107] 96 (02/10 0718) Resp:  [17-18] 18 (02/10 0454) BP: (119-129)/(62-79) 121/67 (02/10 0718) SpO2:  [99 %-100 %] 100 % (02/10 0454)  Physical Exam:  General: alert, cooperative and no distress Lochia: appropriate Uterine Fundus: firm Incision: pressure dressing in place  DVT Evaluation: No evidence of DVT seen on physical exam. No cords or calf tenderness.  Recent Labs    09/09/20 0953 09/11/20 0450  HGB 9.7* 9.2*  HCT 31.4* 30.2*    CBG (last 3)  Recent Labs    09/11/20 1754 09/11/20 2332 09/12/20 0615  GLUCAP 166* 116* 93    Assessment/Plan: Status post Cesarean section. Doing well postoperatively.  CBG this am of 93, currently on metformin and SS Breastfeeding, had BTL for contraception MMR ordered for rubella non immune status Counseled about Venofer for anemia, she agreed. This was ordered. Continue routine postpartum care   Verita Schneiders, MD 09/12/2020, 7:24 AM

## 2020-09-13 ENCOUNTER — Other Ambulatory Visit: Payer: Self-pay | Admitting: Family Medicine

## 2020-09-13 LAB — GLUCOSE, CAPILLARY
Glucose-Capillary: 102 mg/dL — ABNORMAL HIGH (ref 70–99)
Glucose-Capillary: 89 mg/dL (ref 70–99)

## 2020-09-13 MED ORDER — METFORMIN HCL 1000 MG PO TABS: 1000.0000 mg | ORAL_TABLET | Freq: Two times a day (BID) | ORAL | 1 refills | Status: DC

## 2020-09-13 MED ORDER — FERROUS SULFATE 325 (65 FE) MG PO TABS: 325.0000 mg | ORAL_TABLET | ORAL | 3 refills | Status: DC

## 2020-09-13 MED ORDER — NIFEDIPINE ER OSMOTIC RELEASE 30 MG PO TB24: 30.0000 mg | ORAL_TABLET | Freq: Every day | ORAL | 0 refills | Status: DC

## 2020-09-13 MED ORDER — VALACYCLOVIR HCL 1 G PO TABS: 500.0000 mg | ORAL_TABLET | Freq: Every day | ORAL | 2 refills | Status: DC

## 2020-09-13 MED ORDER — OXYCODONE-ACETAMINOPHEN 5-325 MG PO TABS: 1.0000 | ORAL_TABLET | Freq: Four times a day (QID) | ORAL | 0 refills | Status: DC | PRN

## 2020-09-13 NOTE — Plan of Care (Signed)
Pt to be discharged home with printed instructions. Willena Jeancharles L Jovonte Commins, RN  

## 2020-09-13 NOTE — Lactation Note (Signed)
This note was copied from a baby's chart. Lactation Consultation Note  Patient Name: Girl Del Wiseman [id number] Date: 09/13/2020 Reason for consult: NICU baby;Follow-up assessment Age:34 hours  LC to mom's patient room. Mom to d/c today. Per mom, she has pumped 1x since delivery. She is leaking today and plans to pump p she eats lunch. LC encouraged frequent pumping (8xday). Mom to take pumping pieces to NICU with her today. Will plan f/u in NICU prn.  Feeding Nipple Type: Nfant Extra Slow Flow (gold)  Discharge Discharge Education: Engorgement and breast care;Other (comment)  Consult Status Consult Status: Follow-up Follow-up type: In-patient   Gwynne Edinger, MA IBCLC 09/13/2020, 11:46 AM

## 2020-09-13 NOTE — Discharge Instructions (Signed)
Postpartum Hypertension Postpartum hypertension is high blood pressure that is higher than normal after childbirth. It usually starts within 1 to 2 days after delivery, but it can happen at any time for up to 6 weeks after delivery. For some women, medical treatment is required to prevent serious complications, such as seizures or stroke. What are the causes? The cause of this condition is not well understood. In some cases, the cause may not be known. Certain conditions may increase your risk. These include:  Hypertension that existed before pregnancy (chronic hypertension).  Hypertension that comes as a result of pregnancy (gestational hypertension).  Hypertensive disorders during pregnancy or seizures in women who have high blood pressure during pregnancy. These conditions are called preeclampsia and eclampsia.  A condition in which the liver, platelets, and red blood cells are damaged during pregnancy (HELLP syndrome).  Obesity.  Diabetes. What are the signs or symptoms? As with all types of hypertension, postpartum hypertension may not have any symptoms. Depending on how high your blood pressure is, you may experience:  Headaches. These may be mild, moderate, or severe. They may also be steady, constant, or sudden in onset (thunderclap headache).  Vision changes, such as blurry vision, flashing lights, or seeing spots.  Nausea and vomiting.  Pain in the upper right side of your abdomen.  Shortness of breath.  Difficulty breathing while lying down.  A decrease in the amount of urine that you pass. How is this diagnosed? This condition may be diagnosed based on the results of a physical exam, blood pressure measurements, and blood and urine tests. You may also have other tests, such as a CT scan or an MRI, to check for other problems of postpartum hypertension. How is this treated? If blood pressure is high enough to require treatment, your options may include:  Medicines to  reduce blood pressure (antihypertensives). Tell your health care provider if you are breastfeeding or if you plan to breastfeed. There are many antihypertensive medicines that are safe to take while breastfeeding.  Treating medical conditions that are causing hypertension.  Treating the complications of hypertension, such as seizures, stroke, or kidney problems. Your health care provider will also continue to monitor your blood pressure closely until it is within a safe range for you. Follow these instructions at home: Learn your goal blood pressure Two numbers make up your blood pressure. The first number is called systolic pressure. The second is called diastolic pressure. An example of a blood pressure reading is "120 over 80" (or 120/80). For most people, goal blood pressure is:  First number: below 140.  Second number: below 90. Your blood pressure is above normal even if only the top or bottom number is above normal. Know what to do before you take your blood pressure 30 minutes before you check your blood pressure:  Do not drink caffeine.  Do not drink alcohol.  Avoid food and drink.  Do not smoke.  Do not exercise. 5 minutes before you check your blood pressure:  Use the bathroom and urinate so that you have an empty bladder.  Sit quietly in a dining room chair. Do not sit in a soft couch or an armchair. Do not talk. Know how to take your blood pressure To check your blood pressure, follow the instructions in the manual that came with your blood pressure monitor. If you have a digital blood pressure monitor, the instructions may be as follows: 1. Sit up straight. 2. Place your feet on the floor. Do   not cross your ankles or legs. 3. Rest your left arm at the level of your heart. You may rest it on a table, desk, or chair. 4. Pull up your shirt sleeve. 5. Wrap the blood pressure cuff around the upper part of your left arm. The cuff should be 1 inch (2.5 cm) above your  elbow. It is best to wrap the cuff around bare skin. 6. Fit the cuff snugly around your arm. You should be able to place only one finger between the cuff and your arm. 7. Put the cord inside the groove of your elbow. 8. Press the power button. 9. Sit quietly while the cuff fills with air and loses air. 10. Write down the numbers on the screen. These are your blood pressure readings. 11. Wait 1-2 minutes and then repeat steps 1-10.   Record your blood pressure readings Follow your health care provider's instructions on how to record your blood pressure readings. If you were asked to use this form, follow these instructions:  Get one reading in the morning (a.m.) before you take any medicines.  Get one reading in the evening (p.m.) before supper.  Take at least 2 readings with each blood pressure check. This makes sure the results are correct. Wait 1-2 minutes between measurements.  Write down the results in the spaces on this form. Date: _______________________  a.m. _____________________(1st reading) _____________________(2nd reading)  p.m. _____________________(1st reading) _____________________(2nd reading) Date: _______________________  a.m. _____________________(1st reading) _____________________(2nd reading)  p.m. _____________________(1st reading) _____________________(2nd reading) Date: _______________________  a.m. _____________________(1st reading) _____________________(2nd reading)  p.m. _____________________(1st reading) _____________________(2nd reading) Date: _______________________  a.m. _____________________(1st reading) _____________________(2nd reading)  p.m. _____________________(1st reading) _____________________(2nd reading) Date: _______________________  a.m. _____________________(1st reading) _____________________(2nd reading)  p.m. _____________________(1st reading) _____________________(2nd reading) General instructions  Take over-the-counter and  prescription medicines only as told by your health care provider.  Do not use any products that contain nicotine or tobacco. These products include cigarettes, chewing tobacco, and vaping devices, such as e-cigarettes. If you need help quitting, ask your health care provider.  Check your blood pressure as often as recommended by your health care provider.  Return to your normal activities as told by your health care provider. Ask your health care provider what activities are safe for you.  Keep all follow-up visits. This is important. Contact a health care provider if:  You have new symptoms, such as: ? A headache that does not get better. ? Dizziness. ? Visual changes. ? Nausea and vomiting. Get help right away if:  You develop difficulty breathing.  You have chest pain.  You faint.  You have any symptoms of a stroke. "BE FAST" is an easy way to remember the main warning signs of a stroke: ? B - Balance. Signs are dizziness, sudden trouble walking, or loss of balance. ? E - Eyes. Signs are trouble seeing or a sudden change in vision. ? F - Face. Signs are sudden weakness or numbness of the face, or the face or eyelid drooping on one side. ? A - Arms. Signs are weakness or numbness in an arm. This happens suddenly and usually on one side of the body. ? S - Speech. Signs are sudden trouble speaking, slurred speech, or trouble understanding what people say. ? T - Time. Time to call emergency services. Write down what time symptoms started.  You have other signs of a stroke, such as: ? A sudden, severe headache with no known cause. ? Nausea or vomiting. ? Seizure. These symptoms   may represent a serious problem that is an emergency. Do not wait to see if the symptoms will go away. Get medical help right away. Call your local emergency services (911 in the [place] Do not drive yourself to the hospital. Summary  Postpartum hypertension is high blood pressure that remains higher than  normal after childbirth.  For some women, medical treatment is required to prevent serious complications, such as seizures or stroke.  Follow your health care provider's instructions on how to record your blood pressure readings.  Keep all follow-up visits. This is important. This information is not intended to replace advice given to you by your health care provider. Make sure you discuss any questions you have with your health care provider. Document Revised: 04/16/2020 Document Reviewed: 04/16/2020 Elsevier Patient Education  2021 [employer]. -take tylenol 1000 mg every 6 hours as needed for pain, alternate with ibuprofen 600 mg every 6 hours -take oxycodone as needed if tylenol and ibuprofen aren't working -drink plenty of water to help with breastfeeding -continue prenatal vitamins while you are breastfeeding -take iron pills every other day with vitamin c, this will help healing as well as breast feeding -ensure you are taking metformin 1000 mg twice daily, follow up your diabetes with primary care provider- [place] -ensure you are taking valtrex 1000 mg daily for your herpes, stay on this dose until you are lesion free and follow up with PCP   Postpartum Care After Cesarean Delivery This sheet gives you information about how to care for yourself from the time you deliver your baby to up to 6-12 weeks after delivery (postpartum period). Your health care provider may also give you more specific instructions. If you have problems or questions, contact your health care provider. Follow these instructions at home: Medicines  Take over-the-counter and prescription medicines only as told by your health care provider.  If you were prescribed an antibiotic medicine, take it as told by your health care provider. Do not stop taking the antibiotic even if you start to feel better.  Ask your health care provider if the medicine prescribed to you: ? Requires you to avoid  driving or using heavy machinery. ? Can cause constipation. You may need to take actions to prevent or treat constipation, such as:  Drink enough fluid to keep your urine pale yellow.  Take over-the-counter or prescription medicines.  Eat foods that are high in fiber, such as beans, whole grains, and fresh fruits and vegetables.  Limit foods that are high in fat and processed sugars, such as fried or sweet foods. Activity  Gradually return to your normal activities as told by your health care provider.  Avoid activities that take a lot of effort and energy (are strenuous) until approved by your health care provider. Walking at a slow to moderate pace is usually safe. Ask your health care provider what activities are safe for you. ? Do not lift anything that is heavier than your baby or 10 lb (4.5 kg) as told by your health care provider. ? Do not vacuum, climb stairs, or drive a car for as long as told by your health care provider.  If possible, have someone help you at home until you are able to do your usual activities yourself.  Rest as much as possible. Try to rest or take naps while your baby is sleeping. Vaginal bleeding  It is normal to have vaginal bleeding (lochia) after delivery. Wear a sanitary pad to absorb vaginal bleeding  and discharge. ? During the first week after delivery, the amount and appearance of lochia is often similar to a menstrual period. ? Over the next few weeks, it will gradually decrease to a dry, yellow-brown discharge. ? For most women, lochia stops completely by 4-6 weeks after delivery. Vaginal bleeding can vary from woman to woman.  Change your sanitary pads frequently. Watch for any changes in your flow, such as: ? A sudden increase in volume. ? A change in color. ? Large blood clots.  If you pass a blood clot, save it and call your health care provider to discuss. Do not flush blood clots down the toilet before you get instructions from your  health care provider.  Do not use tampons or douches until your health care provider says this is safe.  If you are not breastfeeding, your period should return 6-8 weeks after delivery. If you are breastfeeding, your period may return anytime between 8 weeks after delivery and the time that you stop breastfeeding. Perineal care  If your C-section (Cesarean section) was unplanned, and you were allowed to labor and push before delivery, you may have pain, swelling, and discomfort of the tissue between your vaginal opening and your anus (perineum). You may also have an incision in the tissue (episiotomy) or the tissue may have torn during delivery. Follow these instructions as told by your health care provider: ? Keep your perineum clean and dry as told by your health care provider. Use medicated pads and pain-relieving sprays and creams as directed. ? If you have an episiotomy or vaginal tear, check the area every day for signs of infection. Check for:  Redness, swelling, or pain.  Fluid or blood.  Warmth.  Pus or a bad smell. ? You may be given a squirt bottle to use instead of wiping to clean the perineum area after you go to the bathroom. As you start healing, you may use the squirt bottle before wiping yourself. Make sure to wipe gently. ? To relieve pain caused by an episiotomy, vaginal tear, or hemorrhoids, try taking a warm sitz bath 2-3 times a day. A sitz bath is a warm water bath that is taken while you are sitting down. The water should only come up to your hips and should cover your buttocks.   Breast care  Within the first few days after delivery, your breasts may feel heavy, full, and uncomfortable (breast engorgement). You may also have milk leaking from your breasts. Your health care provider can suggest ways to help relieve breast discomfort. Breast engorgement should go away within a few days.  If you are breastfeeding: ? Wear a bra that supports your breasts and fits you  well. ? Keep your nipples clean and dry. Apply creams and ointments as told by your health care provider. ? You may need to use breast pads to absorb milk leakage. ? You may have uterine contractions every time you breastfeed for several weeks after delivery. Uterine contractions help your uterus return to its normal size. ? If you have any problems with breastfeeding, work with your health care provider or a Science writer.  If you are not breastfeeding: ? Avoid touching your breasts as this can make your breasts produce more milk. ? Wear a well-fitting bra and use cold packs to help with swelling. ? Do not squeeze out (express) milk. This causes you to make more milk. Intimacy and sexuality  Ask your health care provider when you can engage in sexual activity.  This may depend on your: ? Risk of infection. ? Healing rate. ? Comfort and desire to engage in sexual activity.  You are able to get pregnant after delivery, even if you have not had your period. If desired, talk with your health care provider about methods of family planning or birth control (contraception). Lifestyle  Do not use any products that contain nicotine or tobacco, such as cigarettes, e-cigarettes, and chewing tobacco. If you need help quitting, ask your health care provider.  Do not drink alcohol, especially if you are breastfeeding. Eating and drinking  Drink enough fluid to keep your urine pale yellow.  Eat high-fiber foods every day. These may help prevent or relieve constipation. High-fiber foods include: ? Whole grain cereals and breads. ? Brown rice. ? Beans. ? Fresh fruits and vegetables.  Take your prenatal vitamins until your postpartum checkup or until your health care provider tells you it is okay to stop.   General instructions  Keep all follow-up visits for you and your baby as told by your health care provider. Most women visit their health care provider for a postpartum checkup within the  first 3-6 weeks after delivery. Contact a health care provider if you:  Feel unable to cope with the changes that a new baby brings to your life, and these feelings do not go away.  Feel unusually sad or worried.  Have breasts that are painful, hard, or turn red.  Have a fever.  Have trouble holding urine or keeping urine from leaking.  Have little or no interest in activities you used to enjoy.  Have not breastfed at all and you have not had a menstrual period for 12 weeks after delivery.  Have stopped breastfeeding and you have not had a menstrual period for 12 weeks after you stopped breastfeeding.  Have questions about caring for yourself or your baby.  Pass a blood clot from your vagina. Get help right away if you:  Have chest pain.  Have difficulty breathing.  Have sudden, severe leg pain.  Have severe pain or cramping in your abdomen.  Bleed from your vagina so much that you fill more than one sanitary pad in one hour. Bleeding should not be heavier than your heaviest period.  Develop a severe headache.  Faint.  Have blurred vision or spots in your vision.  Have a bad-smelling vaginal discharge.  Have thoughts about hurting yourself or your baby. If you ever feel like you may hurt yourself or others, or have thoughts about taking your own life, get help right away. You can go to your nearest emergency department or call:  Your local emergency services (911 in the [place]  A suicide crisis helpline, such as the [facility] at (978)170-8070. This is open 24 hours a day. Summary  The period of time from when you deliver your baby to up to 6-12 weeks after delivery is called the postpartum period.  Gradually return to your normal activities as told by your health care provider.  Keep all follow-up visits for you and your baby as told by your health care provider. This information is not intended to replace advice given to you by your  health care provider. Make sure you discuss any questions you have with your health care provider. Document Revised: 03/09/2018 Document Reviewed: 03/09/2018 Elsevier Patient Education  [employer].

## 2020-09-17 ENCOUNTER — Ambulatory Visit: Payer: Self-pay

## 2020-09-18 ENCOUNTER — Other Ambulatory Visit: Payer: Self-pay

## 2020-09-18 ENCOUNTER — Ambulatory Visit (INDEPENDENT_AMBULATORY_CARE_PROVIDER_SITE_OTHER): Payer: Medicaid Other

## 2020-09-18 VITALS — BP 136/85 | Wt 264.4 lb

## 2020-09-18 DIAGNOSIS — Z0130 Encounter for examination of blood pressure without abnormal findings: Secondary | ICD-10-CM

## 2020-09-18 DIAGNOSIS — Z5189 Encounter for other specified aftercare: Secondary | ICD-10-CM

## 2020-09-18 NOTE — Progress Notes (Signed)
Here today for incision check following repeat c-section on 09/10/20. Pt reports removing honeycomb dressing yesterday with no issue. Removed soiled steri strips. Incision is clean and dry with pink, open healing area to right of incision. Reviewed good wound care with patient.  Pt has history of chronic hypertension. Currently taking 30 mg nifedipine daily. BP today is 136/85.   Pt reports limited mobility and pain 9/10 in left hand; visible swelling to left hand and forearm. Pt states IV iron infusion infiltrated into her hand the evening of 09/12/20 while in hospital. States she was told to continue cold compresses at home. Pt reports taking Tylenol, ibuprofen, and Percocet for pain. [place], DO to bedside for assessment, who states he will follow up with patient via MyChart message for treatment options.   Pt escorted to food market for shopping experience prior to check out.   Apolonio Schneiders RN 09/18/20

## 2020-09-20 MED ORDER — CEFADROXIL 500 MG PO CAPS: 500.0000 mg | ORAL_CAPSULE | Freq: Two times a day (BID) | ORAL | 0 refills | Status: DC

## 2020-09-20 MED ORDER — PREDNISONE 20 MG PO TABS
40.0000 mg | ORAL_TABLET | Freq: Every day | ORAL | 0 refills | Status: AC
Start: 2020-09-20 — End: 2020-09-25

## 2020-09-20 NOTE — Addendum Note (Signed)
Addended by: Truett Mainland on: 09/20/2020 11:44 AM   Modules accepted: Orders

## 2020-09-20 NOTE — Progress Notes (Signed)
Patient about a week postpartum and got a Venofer infusion because of anemia after delivery. During the iron infusion, she had extravasation of the Venofer. At the time, they just treated her with ice and pain medicine.However, a week later, she's still having a lot of pain and has a lot of difficulty with flexion of her middle finger (IV site was in her hand). She has a lot of tenderness along the extensor tendon, mainly the middle digit. There is some tenderness past the wrist joint, but not much into the muscle itself.   Will give course of steroids and antibiotic. Recheck next week.

## 2020-09-27 ENCOUNTER — Encounter: Payer: Self-pay | Admitting: Family Medicine

## 2020-09-27 ENCOUNTER — Ambulatory Visit (INDEPENDENT_AMBULATORY_CARE_PROVIDER_SITE_OTHER): Payer: Medicaid Other | Admitting: Family Medicine

## 2020-09-27 ENCOUNTER — Other Ambulatory Visit: Payer: Self-pay

## 2020-09-27 VITALS — BP 139/89 | HR 74 | Wt 260.5 lb

## 2020-09-27 DIAGNOSIS — T148XXA Other injury of unspecified body region, initial encounter: Secondary | ICD-10-CM

## 2020-09-27 NOTE — Progress Notes (Signed)
° °  Subjective:    Patient ID: [name], female    DOB: Mar 04, 1986, 35 y.o.   MRN: [id number]  HPI Patient seen for follow up of extravasation of iron infusion while she was hospitalized with having the baby. She was prescribed antibiotics and steroids, which she used for about 3 days. She also used a remedy of essential oils. She has been moving it and exercising it. She report dramatic decrease in pain and dramatic improvement in mobility. Has some residual pain in MCP joint of 3rd digit of her left hand and along extensor tendon of that digit.   Review of Systems     Objective:   Physical Exam Vitals reviewed.  Constitutional:      Appearance: Normal appearance.  Skin:    Capillary Refill: Capillary refill takes less than 2 seconds.  Neurological:     General: No focal deficit present.     Mental Status: She is alert.     Comments: Grip strength equal. Pincher strength equal. Some tenderness on MCP joint and along extensor tendon of 3rd digit on left hand.  Psychiatric:        Mood and Affect: Mood normal.        Behavior: Behavior normal.        Thought Content: Thought content normal.        Judgment: Judgment normal.        Assessment & Plan:  1. Extravasation injury Improved. Continue current regimen.

## 2020-10-11 ENCOUNTER — Other Ambulatory Visit: Payer: Self-pay

## 2020-10-11 ENCOUNTER — Encounter: Payer: Self-pay | Admitting: Obstetrics and Gynecology

## 2020-10-11 ENCOUNTER — Ambulatory Visit (INDEPENDENT_AMBULATORY_CARE_PROVIDER_SITE_OTHER): Payer: Medicaid Other | Admitting: Obstetrics and Gynecology

## 2020-10-11 MED ORDER — NIFEDIPINE ER OSMOTIC RELEASE 30 MG PO TB24: 30.0000 mg | ORAL_TABLET | Freq: Every day | ORAL | 0 refills | Status: DC

## 2020-10-11 NOTE — Progress Notes (Signed)
    [name]  [name] is a 35 y.o. 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  Anesthesia: epidural and spinal. Postpartum course has been uncomplicated. Baby is doing well. Baby is feeding by bottle - [name] Start. Bleeding: occasional spotting, no period yet. Bowel function is normal. Bladder function is normal. Patient is sexually active and no issues with that.   Pap negative 2021.    Edinburgh Postnatal Depression Scale - 10/11/20 0854      Edinburgh Postnatal Depression Scale:  In the Past 7 Days   I have been able to laugh and see the funny side of things. 0    I have looked forward with enjoyment to things. 0    I have blamed myself unnecessarily when things went wrong. 0    I have been anxious or worried for no good reason. 0    I have felt scared or panicky for no good reason. 0    Things have been getting on top of me. 0    I have been so unhappy that I have had difficulty sleeping. 0    I have felt sad or miserable. 0    I have been so unhappy that I have been crying. 0    The thought of harming myself has occurred to me. 0    Edinburgh Postnatal Depression Scale Total 0           Review of Systems Pertinent items noted in HPI and remainder of comprehensive ROS otherwise negative.    Objective:  BP 127/84   Pulse (!) 101   LMP 11/17/2019 (Exact Date)    NAD Abdomen: obese, soft, nttp, nd. C/d/i incision Assessment:    Normal postpartum exam.  Plan:   *PP: routine care. Patient told to let us know any problems with her period and they should resume sometime in the next month or two and if not to let us know. *cHTN: patient on procardia xl 30 qday. Pt told to stay on this for her now and [facility] I told her to call them for a physical in the next month or two.  *DM2:pt not taking metformin due to GI issues and sporadic cBGs are acceptable. I told her to check them more often a few  days before her PCP visit  RTC: PRN  Aletha Halim, [place] for [employer], [place]

## 2020-10-14 ENCOUNTER — Encounter: Payer: Self-pay | Admitting: Obstetrics and Gynecology

## 2020-10-16 ENCOUNTER — Encounter: Payer: Self-pay | Admitting: Lactation Services

## 2020-11-28 ENCOUNTER — Ambulatory Visit: Payer: Medicaid Other | Admitting: Physician Assistant

## 2020-11-28 ENCOUNTER — Other Ambulatory Visit: Payer: Self-pay

## 2020-11-28 VITALS — BP 127/76 | HR 87 | Temp 98.2°F | Resp 18 | Ht 68.0 in | Wt 268.0 lb

## 2020-11-28 DIAGNOSIS — E119 Type 2 diabetes mellitus without complications: Secondary | ICD-10-CM | POA: Diagnosis not present

## 2020-11-28 DIAGNOSIS — I10 Essential (primary) hypertension: Secondary | ICD-10-CM | POA: Diagnosis not present

## 2020-11-28 DIAGNOSIS — D649 Anemia, unspecified: Secondary | ICD-10-CM

## 2020-11-28 DIAGNOSIS — J4520 Mild intermittent asthma, uncomplicated: Secondary | ICD-10-CM

## 2020-11-28 DIAGNOSIS — E559 Vitamin D deficiency, unspecified: Secondary | ICD-10-CM

## 2020-11-28 NOTE — Progress Notes (Signed)
Patient has taken medication today. Patient denies pain at this time.

## 2020-11-28 NOTE — Patient Instructions (Addendum)
We will call you with your lab results and let you know if you should resume your metformin at this time.  Please make sure to return to the mobile medicine unit next week to complete your fasting lab.  I do encourage you to check your blood sugar levels on a daily basis, keep a written log and have available for all office visits.  Please let us know if there is anything else we can do for you  Kennieth Rad, PA-C Physician Assistant [facility] [email or web address]    Blood Glucose Monitoring, Adult Monitoring your blood sugar (glucose) is an important part of managing your diabetes. Blood glucose monitoring involves checking your blood glucose as often as directed and keeping a log or record of your results over time. Checking your blood glucose regularly and keeping a blood glucose log can:  Help you and your health care provider adjust your diabetes management plan as needed, including your medicines or insulin.  Help you understand how food, exercise, illnesses, and medicines affect your blood glucose.  Let you know what your blood glucose is at any time. You can quickly find out if you have low blood glucose (hypoglycemia) or high blood glucose (hyperglycemia). Your health care provider will set individualized treatment goals for you. Your goals will be based on your age, other medical conditions you have, and how you respond to diabetes treatment. Generally, the goal of treatment is to maintain the following blood glucose levels:  Before meals (preprandial): 80-130 mg/dL (4.4-7.2 mmol/L).  After meals (postprandial): below 180 mg/dL (10 mmol/L).  A1C level: less than 7%. Supplies needed:  Blood glucose meter.  Test strips for your meter. Each meter has its own strips. You must use the strips that came with your meter.  A needle to prick your finger (lancet). Do not use a lancet more than one time.  A device that  holds the lancet (lancing device).  A journal or log book to write down your results. How to check your blood glucose Checking your blood glucose 1. Wash your hands for at least 20 seconds with soap and water. 2. Prick the side of your finger (not the tip) with the lancet. Do not use the same finger consecutively. 3. Gently rub the finger until a small drop of blood appears. 4. Follow instructions that come with your meter for inserting the test strip, applying blood to the strip, and using your blood glucose meter. 5. Write down your result and any notes in your log.   Using alternative sites Some meters allow you to use areas of your body other than your finger (alternative sites) to test your blood. The most common alternative sites are the forearm, the thigh, and the palm of your hand. Alternative sites may not be as accurate as the fingers because blood flow is slower in those areas. This means that the result you get may be delayed, and it may be different from the result that you would get from your finger. Use the finger only, and do not use alternative sites, if:  You think you have hypoglycemia.  You sometimes do not know that your blood glucose is getting low (hypoglycemia unawareness). General tips and recommendations Blood glucose log  Every time you check your blood glucose, write down your result. Also write down any notes about things that may be affecting your blood glucose, such as your diet and exercise for the day. This information can help you and your health care  provider: ? Look for patterns in your blood glucose over time. ? Adjust your diabetes management plan as needed.  Check if your meter allows you to download your records to a computer or if there is an app for the meter. Most glucose meters store a record of glucose readings in the meter.   If you have type 1 diabetes:  Check your blood glucose 4 or more times a day if you are on intensive insulin therapy  with multiple daily injections (MDI) or if you are using an insulin pump. Check your blood glucose: ? Before every meal and snack. ? Before bedtime.  Also check your blood glucose: ? If you have symptoms of hypoglycemia. ? After treating low blood glucose. ? Before doing activities that create a risk for injury, like driving or using machinery. ? Before and after exercise. ? Two hours after a meal. ? Occasionally between 2:00 a.m. and 3:00 a.m., as directed.  You may need to check your blood glucose more often, 6-10 times per day, if: ? You have diabetes that is not well controlled. ? You are ill. ? You have a history of severe hypoglycemia. ? You have hypoglycemia unawareness. If you have type 2 diabetes:  Check your blood glucose 2 or more times a day if you take insulin or other diabetes medicines.  Check your blood glucose 4 or more times a day if you are on intensive insulin therapy. Occasionally, you may also need to check your glucose between 2:00 a.m. and 3:00 a.m., as directed.  Also check your blood glucose: ? Before and after exercise. ? Before doing activities that create a risk for injury, like driving or using machinery.  You may need to check your blood glucose more often if: ? Your medicine is being adjusted. ? Your diabetes is not well controlled. ? You are ill. General tips  Make sure you always have your supplies with you.  After you use a few boxes of test strips, adjust (calibrate) your blood glucose meter by following instructions that came with your meter.  If you have questions or need help, all blood glucose meters have a 24-hour hotline phone number available that you can call. Also contact your health care provider with questions or concerns you may have. Where to find more information  The American Diabetes Association: www.diabetes.org  The Association of Diabetes Care & Education Specialists: www.diabeteseducator.org Contact a health care  provider if:  Your blood glucose is at or above 240 mg/dL (13.3 mmol/L) for 2 days in a row.  You have been sick or have had a fever for 2 days or longer, and you are not getting better.  You have any of the following problems for more than 6 hours: ? You cannot eat or drink. ? You have nausea or vomiting. ? You have diarrhea. Get help right away if:  Your blood glucose is lower than 54 mg/dL (3 mmol/L).  You become confused, or you have trouble thinking clearly.  You have difficulty breathing.  You have moderate or large ketone levels in your urine. These symptoms may represent a serious problem that is an emergency. Do not wait to see if the symptoms will go away. Get medical help right away. Call your local emergency services (911 in the [place] Do not drive yourself to the hospital. Summary  Monitoring your blood glucose is an important part of managing your diabetes.  Blood glucose monitoring involves checking your blood glucose as often as directed and  keeping a log or record of your results over time.  Your health care provider will set individualized treatment goals for you. Your goals will be based on your age, other medical conditions you have, and how you respond to diabetes treatment.  Every time you check your blood glucose, write down your result. Also, write down any notes about things that may be affecting your blood glucose, such as your diet and exercise for the day. This information is not intended to replace advice given to you by your health care provider. Make sure you discuss any questions you have with your health care provider. Document Revised: 04/17/2020 Document Reviewed: 04/17/2020 Elsevier Patient Education  2021 [employer].

## 2020-11-28 NOTE — Progress Notes (Signed)
Established Patient Office Visit  Subjective:  Patient ID: [name], female    DOB: 11-Dec-1985  Age: 35 y.o. MRN: [id number]  CC:  Chief Complaint  Patient presents with  . Annual Exam    HPI [name] presents to reestablish care, states that she gave birth via cesarean section to a healthy baby girl in February 2022.  Reports at her postpartum visit she was told that she could stop her metformin because "her numbers were normal.  States that she believes her A1c was 7.9.  Does endorse that she had diabetes prior to her pregnancy.  Reports that she was taking 1000 mg of metformin twice a day.  Does not check blood glucose levels at home.  Reports that she is no longer taking blood pressure medication, does not check blood pressure at home, blood pressure is well controlled during today's office visit.  Reports that she was given an iron infusion prior to leaving the hospital, states no previous history of anemia.   Past Medical History:  Diagnosis Date  . Asthma   . COVID-19 07/04/2020   05/04/20  . Diabetes mellitus without complication ([place])   . DKA (diabetic ketoacidoses) 08/27/2018  . Family history of diabetes mellitus in mother 01/10/2018  . 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 approx 3-4cm at time of 01/2016 c-section  . GBS (group B streptococcus) infection   . Hearing loss    pt states she was tested in high school and told it was selective she says she continues to have a deficit and does not feel it is selective  . Herpes   . Herpes simplex vulvovaginitis 09/06/2020  . Hypertension   . Newly diagnosed diabetes ([place]) 01/10/2018   Diagnosed 01/10/18  A1c 7.5%  . Sickle cell trait ([place]) 09/01/2014   [ ]  ucx q trimester  . Trichomonas vaginitis     Past Surgical History:  Procedure Laterality Date  . CESAREAN SECTION N/A 01/08/2016   Procedure: CESAREAN SECTION;  Surgeon: Aletha Halim, MD;  Location: [facility];  Service:  Obstetrics;  Laterality: N/A;  . CESAREAN SECTION N/A 09/10/2020   Procedure: CESAREAN SECTION;  Surgeon: Truett Mainland, DO;  Location: [facility]S;  Service: Obstetrics;  Laterality: N/A;  . DILATION AND CURETTAGE OF UTERUS    . TUBAL LIGATION  09/10/2020   Procedure: BILATERAL TUBAL LIGATION;  Surgeon: Truett Mainland, DO;  Location: [facility];  Service: Obstetrics;;  . Arnetha Courser TOOTH EXTRACTION      Family History  Problem Relation Age of Onset  . Diabetes Mother   . Hypertension Mother   . Hyperlipidemia Mother   . Heart disease Mother   . Breast cancer Mother        75  . Ovarian cancer Mother        Unknown age  . Diabetes Sister   . Diabetes Brother   . Breast cancer Cousin   . Heart disease Maternal Grandmother     Social History   Socioeconomic History  . Marital status: Divorced    Spouse name: Not on file  . Number of children: Not on file  . Years of education: Not on file  . Highest education level: Not on file  Occupational History  . Not on file  Tobacco Use  . Smoking status: Former Smoker    Types: Cigarettes  . Smokeless tobacco: Never Used  . Tobacco comment: quit 2013  Vaping Use  .  Vaping Use: Never used  Substance and Sexual Activity  . Alcohol use: No  . Drug use: No  . Sexual activity: Yes    Birth control/protection: None  Other Topics Concern  . Not on file  Social History Narrative  . Not on file   Social Determinants of Health   Financial Resource Strain: Not on file  Food Insecurity: Food Insecurity Present  . Worried About Charity fundraiser in the Last Year: Sometimes true  . Ran Out of Food in the Last Year: Sometimes true  Transportation Needs: Unmet Transportation Needs  . Lack of Transportation (Medical): Yes  . Lack of Transportation (Non-Medical): Yes  Physical Activity: Not on file  Stress: Not on file  Social Connections: Not on file  Intimate Partner Violence: Not on file    Outpatient Medications Prior to Visit   Medication Sig Dispense Refill  . acetaminophen (TYLENOL) 500 MG tablet Take 1,000 mg by mouth every 8 (eight) hours as needed for moderate pain.    Marland Kitchen albuterol (VENTOLIN HFA) 108 (90 Base) MCG/ACT inhaler Inhale 2 puffs into the lungs every 6 (six) hours as needed for wheezing or shortness of breath. 1 each 1  . valACYclovir (VALTREX) 500 MG tablet TAKE 1 TABLET (500 MG TOTAL) BY MOUTH DAILY. IF OUTBREAK INCREASE TO 4 TABLETS DAILY X 5 DAYS 90 tablet 2  . NIFEdipine (ADALAT CC) 30 MG 24 hr tablet TAKE 1 TABLET (30 MG TOTAL) BY MOUTH DAILY. (Patient not taking: Reported on 11/28/2020) 30 tablet 0  . NIFEdipine (PROCARDIA XL) 30 MG 24 hr tablet Take 1 tablet (30 mg total) by mouth daily. (Patient not taking: Reported on 11/28/2020) 60 tablet 0   No facility-administered medications prior to visit.    No Known Allergies  ROS Review of Systems  Constitutional: Negative for chills, fatigue and fever.  HENT: Negative.   Eyes: Negative.   Respiratory: Negative for shortness of breath.   Cardiovascular: Negative for chest pain.  Gastrointestinal: Negative.   Endocrine: Negative.   Genitourinary: Negative.   Musculoskeletal: Negative.   Skin: Negative.   Allergic/Immunologic: Negative.   Neurological: Negative.   Hematological: Negative.   Psychiatric/Behavioral: Negative.       Objective:    Physical Exam Vitals and nursing note reviewed.  Constitutional:      General: She is not in acute distress.    Appearance: Normal appearance. She is obese. She is not ill-appearing.  HENT:     Head: Normocephalic and atraumatic.     Right Ear: External ear normal.     Left Ear: External ear normal.     Nose: Nose normal.     Mouth/Throat:     Mouth: Mucous membranes are moist.     Pharynx: Oropharynx is clear.  Eyes:     Extraocular Movements: Extraocular movements intact.     Conjunctiva/sclera: Conjunctivae normal.     Pupils: Pupils are equal, round, and reactive to light.   Cardiovascular:     Rate and Rhythm: Normal rate and regular rhythm.     Pulses: Normal pulses.     Heart sounds: Normal heart sounds.  Pulmonary:     Effort: Pulmonary effort is normal.     Breath sounds: Normal breath sounds.  Musculoskeletal:        General: Normal range of motion.     Cervical back: Normal range of motion and neck supple.  Skin:    General: Skin is warm and dry.  Neurological:  Mental Status: She is alert and oriented to person, place, and time.  Psychiatric:        Mood and Affect: Mood normal.        Behavior: Behavior normal.        Thought Content: Thought content normal.        Judgment: Judgment normal.     BP 127/76 (BP Location: Left Arm, Patient Position: Sitting, Cuff Size: Normal)   Pulse 87   Temp 98.2 F (36.8 C) (Oral)   Resp 18   Ht 5\' 8"  (1.727 m)   Wt 268 lb (121.6 kg)   SpO2 99%   BMI 40.75 kg/m  Wt Readings from Last 3 Encounters:  11/28/20 268 lb (121.6 kg)  09/27/20 260 lb 8 oz (118.2 kg)  09/18/20 264 lb 6.4 oz (119.9 kg)     Health Maintenance Due  Topic Date Due  . OPHTHALMOLOGY EXAM  Never done  . FOOT EXAM  01/11/2019  . URINE MICROALBUMIN  02/03/2019    There are no preventive care reminders to display for this patient.  Lab Results  Component Value Date   TSH 1.290 02/29/2020   Lab Results  Component Value Date   WBC 12.9 (H) 09/11/2020   HGB 9.2 (L) 09/11/2020   HCT 30.2 (L) 09/11/2020   MCV 72.6 (L) 09/11/2020   PLT 246 09/11/2020   Lab Results  Component Value Date   NA 134 03/15/2020   K 4.2 03/15/2020   CO2 23 03/15/2020   GLUCOSE 165 (H) 03/15/2020   BUN 4 (L) 03/15/2020   CREATININE 0.63 03/15/2020   BILITOT 0.3 03/01/2020   ALKPHOS 64 03/01/2020   AST 10 (L) 03/01/2020   ALT 12 03/01/2020   PROT 6.0 (L) 03/01/2020   ALBUMIN 3.1 (L) 03/01/2020   CALCIUM 8.9 03/15/2020   ANIONGAP 6 03/02/2020   No results found for: CHOL No results found for: HDL No results found for: LDLCALC No  results found for: TRIG No results found for: CHOLHDL Lab Results  Component Value Date   HGBA1C 7.3 (H) 09/10/2020      Assessment & Plan:   Problem List Items Addressed This Visit   None   1. Type 2 diabetes mellitus without complication, without long-term current use of insulin (HCC) Last A1c in February 2022 was 7.3, patient states her last postpartum check was the beginning of March 2022, unable to locate that record in her medical chart.  Will repeat A1c today, patient encouraged to check blood glucose levels at home on a daily basis, keep a written log and have available at all office visits.  Will have patient resume metformin based on A1c numbers.  Patient understands and agrees.  Patient to return to mobile unit next week for fasting lipid panel.  Patient was given appointment to reestablish care at [facility] on February 04, 2021. - Comp. Metabolic Panel (12) - Lipid panel; Future - TSH - Microalbumin / creatinine urine ratio - Vitamin D, 25-hydroxy - Hemoglobin A1c  2. Mild intermittent asthma without complication Rare use of inhaler, continue current regimen  3. Essential hypertension Well-controlled without medication, continue to check blood pressure on a regular basis  4. Anemia, unspecified type  - CBC with Differential/Platelet - Iron, TIBC and Ferritin Panel    I have reviewed the patient's medical history (PMH, PSH, Social History, Family History, Medications, and allergies) , and have been updated if relevant. I spent 36 minutes reviewing chart and  face to face time with patient.      No orders of the defined types were placed in this encounter.   Follow-up: No follow-ups on file.    Loraine Grip Mayers, PA-C

## 2020-11-29 DIAGNOSIS — I10 Essential (primary) hypertension: Secondary | ICD-10-CM | POA: Insufficient documentation

## 2020-11-29 DIAGNOSIS — E559 Vitamin D deficiency, unspecified: Secondary | ICD-10-CM | POA: Insufficient documentation

## 2020-11-29 LAB — CBC WITH DIFFERENTIAL/PLATELET
Basophils Absolute: 0 10*3/uL (ref 0.0–0.2)
Basos: 1 %
EOS (ABSOLUTE): 0 10*3/uL (ref 0.0–0.4)
Eos: 1 %
Hematocrit: 35.5 % (ref 34.0–46.6)
Hemoglobin: 11.6 g/dL (ref 11.1–15.9)
Immature Grans (Abs): 0 10*3/uL (ref 0.0–0.1)
Immature Granulocytes: 0 %
Lymphocytes Absolute: 2.1 10*3/uL (ref 0.7–3.1)
Lymphs: 41 %
MCH: 25.7 pg — ABNORMAL LOW (ref 26.6–33.0)
MCHC: 32.7 g/dL (ref 31.5–35.7)
MCV: 79 fL (ref 79–97)
Monocytes Absolute: 0.3 10*3/uL (ref 0.1–0.9)
Monocytes: 6 %
Neutrophils Absolute: 2.7 10*3/uL (ref 1.4–7.0)
Neutrophils: 51 %
Platelets: 344 10*3/uL (ref 150–450)
RBC: 4.52 x10E6/uL (ref 3.77–5.28)
RDW: 19.6 % — ABNORMAL HIGH (ref 11.7–15.4)
WBC: 5.1 10*3/uL (ref 3.4–10.8)

## 2020-11-29 LAB — VITAMIN D 25 HYDROXY (VIT D DEFICIENCY, FRACTURES): Vit D, 25-Hydroxy: 9.3 ng/mL — ABNORMAL LOW (ref 30.0–100.0)

## 2020-11-29 LAB — IRON,TIBC AND FERRITIN PANEL
Ferritin: 39 ng/mL (ref 15–150)
Iron Saturation: 18 % (ref 15–55)
Iron: 63 ug/dL (ref 27–159)
Total Iron Binding Capacity: 357 ug/dL (ref 250–450)
UIBC: 294 ug/dL (ref 131–425)

## 2020-11-29 LAB — HEMOGLOBIN A1C
Est. average glucose Bld gHb Est-mCnc: 220 mg/dL
Hgb A1c MFr Bld: 9.3 % — ABNORMAL HIGH (ref 4.8–5.6)

## 2020-11-29 LAB — COMP. METABOLIC PANEL (12)
AST: 9 IU/L (ref 0–40)
Albumin/Globulin Ratio: 1.9 (ref 1.2–2.2)
Albumin: 4.6 g/dL (ref 3.8–4.8)
Alkaline Phosphatase: 117 IU/L (ref 44–121)
BUN/Creatinine Ratio: 15 (ref 9–23)
BUN: 13 mg/dL (ref 6–20)
Bilirubin Total: 0.3 mg/dL (ref 0.0–1.2)
Calcium: 9.4 mg/dL (ref 8.7–10.2)
Chloride: 96 mmol/L (ref 96–106)
Creatinine, Ser: 0.87 mg/dL (ref 0.57–1.00)
Globulin, Total: 2.4 g/dL (ref 1.5–4.5)
Glucose: 389 mg/dL — ABNORMAL HIGH (ref 65–99)
Potassium: 4.5 mmol/L (ref 3.5–5.2)
Sodium: 133 mmol/L — ABNORMAL LOW (ref 134–144)
Total Protein: 7 g/dL (ref 6.0–8.5)
eGFR: 90 mL/min/{1.73_m2} (ref >59)

## 2020-11-29 LAB — MICROALBUMIN / CREATININE URINE RATIO
Creatinine, Urine: 30.1 mg/dL
Microalb/Creat Ratio: 10 mg/g creat (ref 0–29)
Microalbumin, Urine: 3.1 ug/mL

## 2020-11-29 LAB — TSH: TSH: 1.35 u[IU]/mL (ref 0.450–4.500)

## 2020-11-29 MED ORDER — VITAMIN D (ERGOCALCIFEROL) 1.25 MG (50000 UNIT) PO CAPS: 50000.0000 [IU] | ORAL_CAPSULE | ORAL | 2 refills | Status: DC

## 2020-12-03 ENCOUNTER — Telehealth: Payer: Self-pay | Admitting: *Deleted

## 2020-12-03 NOTE — Telephone Encounter (Signed)
MA UTR patient due to contact being a text now number and patient not answering x2.

## 2020-12-03 NOTE — Telephone Encounter (Signed)
-----   Message from Kennieth Rad, [place] sent at 11/29/2020 10:13 AM EDT ----- Please call patient and let her know that her A1c is 9.3.  She does need to resume metformin at this time.  Her vitamin D is low, she needs to take 50,000 units once a week for the next 12 weeks and have it rechecked at that time.  She does not show signs of anemia, thyroid function, kidney and liver function are within normal limits.  Vitamin D sent to pharmacy.  Please encourage her to return to the mobile unit next week to complete her fasting lab and also in 3 to 4 weeks for follow-up prior to establishing with her new PCP

## 2020-12-05 ENCOUNTER — Other Ambulatory Visit: Payer: Medicaid Other

## 2020-12-05 ENCOUNTER — Other Ambulatory Visit: Payer: Self-pay

## 2020-12-05 DIAGNOSIS — E119 Type 2 diabetes mellitus without complications: Secondary | ICD-10-CM

## 2020-12-05 NOTE — Addendum Note (Signed)
Addended by: Trecia Rogers on: 12/05/2020 11:42 AM   Modules accepted: Orders

## 2020-12-05 NOTE — Progress Notes (Signed)
Patient received a copy of recent blood work and understood the results and treatment. Patient tolerated blood well today today for fasting lipid. Patient shares she had coffee 4 hrs prior to the visit with no sugar creamer.

## 2020-12-06 ENCOUNTER — Encounter: Payer: Self-pay | Admitting: Nurse Practitioner

## 2020-12-06 LAB — LIPID PANEL
Chol/HDL Ratio: 4.6 ratio — ABNORMAL HIGH (ref 0.0–4.4)
Cholesterol, Total: 212 mg/dL — ABNORMAL HIGH (ref 100–199)
HDL: 46 mg/dL (ref >39)
LDL Chol Calc (NIH): 138 mg/dL — ABNORMAL HIGH (ref 0–99)
Triglycerides: 154 mg/dL — ABNORMAL HIGH (ref 0–149)
VLDL Cholesterol Cal: 28 mg/dL (ref 5–40)

## 2020-12-10 ENCOUNTER — Telehealth: Payer: Self-pay | Admitting: *Deleted

## 2020-12-10 NOTE — Telephone Encounter (Signed)
Patient verified DOB Patient is aware of overall cholesterol level being elevated and needing to begin OTC omega 3 fish oil daily along with adhering to a low cholesterol diet. Patient had no further concerns.

## 2020-12-10 NOTE — Telephone Encounter (Signed)
-----   Message from Kennieth Rad, [place] sent at 12/09/2020  1:09 PM EDT ----- Please call patient and let her know that her cholesterol overall is elevated, due to her being treated for diabetes, I recommend that she start  over-the-counter fish oil and follow a low-cholesterol diet.  She should have this level rechecke in 6 mo- 1 year

## 2020-12-17 ENCOUNTER — Telehealth: Payer: Medicaid Other | Admitting: Nurse Practitioner

## 2020-12-18 ENCOUNTER — Ambulatory Visit: Payer: Medicaid Other | Attending: Nurse Practitioner | Admitting: Nurse Practitioner

## 2020-12-18 ENCOUNTER — Other Ambulatory Visit: Payer: Self-pay

## 2021-02-02 ENCOUNTER — Encounter: Payer: Self-pay | Admitting: Nurse Practitioner

## 2021-02-04 ENCOUNTER — Telehealth: Payer: Self-pay | Admitting: Nurse Practitioner

## 2021-02-04 ENCOUNTER — Other Ambulatory Visit: Payer: Self-pay

## 2021-02-04 ENCOUNTER — Ambulatory Visit: Payer: Medicaid Other | Attending: Nurse Practitioner | Admitting: Nurse Practitioner

## 2021-02-04 NOTE — Telephone Encounter (Signed)
No answer. LVM to return call to office

## 2021-02-10 ENCOUNTER — Ambulatory Visit: Payer: Medicaid Other | Admitting: Nurse Practitioner

## 2021-03-25 ENCOUNTER — Telehealth: Payer: Medicaid Other | Admitting: Nurse Practitioner

## 2022-08-12 ENCOUNTER — Encounter: Payer: Self-pay | Admitting: *Deleted

## 2022-08-12 NOTE — Congregational Nurse Program (Signed)
  Dept: [phone]   Congregational Nurse Program Note  Date of Encounter: 08/12/2022  Past Medical History: No past medical history on file.  Encounter Details:  CNP Questionnaire - 08/12/22 1313       Questionnaire   Ask client: Do you give verbal consent for me to treat you today? Yes    Student Assistance N/A    Location Patient Served  [facility]    Visit Setting with Client Organization    Patient Status Unhoused    Insurance Uninsured ([place]s/Self-Pay/Medicaid Family Planning)    Insurance/Financial Assistance Referral N/A    Medication N/A    Medical Provider No    Screening Referrals Made N/A    Medical Referrals Made N/A    Medical Appointment Made Other   [facility] referral   Recently w/o PCP, now 1st time PCP visit completed due to CNs referral or appointment made N/A    Food Have Food Insecurities   see CN note   Transportation N/A    Housing/Utilities No permanent housing    Interpersonal Safety N/A    Interventions Advocate/Support    Abnormal to Normal Screening Since Last CN Visit N/A    Screenings CN Performed N/A    Sent Client to Lab for: N/A    Did client attend any of the following based off CNs referral or appointments made? N/A    ED Visit Averted N/A    Life-Saving Intervention Made N/A           Client seen at [facility] shelter with several children. Asked client is she was receiving help with housing as she reports staying the past two nights at [facility] flag. Client reports she has five children, 23, 9, 6,7,and 1. She reports being from [place] and coming to [facility] Dec 2 along with husband and children. She reports staying in motel 4 nights and money ran out. Referred to [facility] and gave phone number with clients verbal approval [id number].She says she has checked [facility] and they could not house entire family. Garrie Elenes W RN CN

## 2022-08-20 ENCOUNTER — Encounter (HOSPITAL_COMMUNITY): Payer: Self-pay | Admitting: Emergency Medicine

## 2022-08-20 ENCOUNTER — Emergency Department (HOSPITAL_COMMUNITY): Payer: Medicaid Other

## 2022-08-20 ENCOUNTER — Emergency Department (HOSPITAL_COMMUNITY)
Admission: EM | Admit: 2022-08-20 | Discharge: 2022-08-21 | Disposition: A | Discharge status: Home / Self Care | Payer: Medicaid Other | Attending: Emergency Medicine | Admitting: Emergency Medicine

## 2022-08-20 ENCOUNTER — Other Ambulatory Visit: Payer: Self-pay

## 2022-08-20 DIAGNOSIS — D500 Iron deficiency anemia secondary to blood loss (chronic): Secondary | ICD-10-CM | POA: Insufficient documentation

## 2022-08-20 DIAGNOSIS — J45909 Unspecified asthma, uncomplicated: Secondary | ICD-10-CM | POA: Insufficient documentation

## 2022-08-20 DIAGNOSIS — Z7951 Long term (current) use of inhaled steroids: Secondary | ICD-10-CM | POA: Insufficient documentation

## 2022-08-20 DIAGNOSIS — E1165 Type 2 diabetes mellitus with hyperglycemia: Secondary | ICD-10-CM | POA: Insufficient documentation

## 2022-08-20 DIAGNOSIS — Z5900 Homelessness unspecified: Secondary | ICD-10-CM | POA: Diagnosis not present

## 2022-08-20 DIAGNOSIS — Z79899 Other long term (current) drug therapy: Secondary | ICD-10-CM | POA: Diagnosis not present

## 2022-08-20 DIAGNOSIS — Z1152 Encounter for screening for COVID-19: Secondary | ICD-10-CM | POA: Diagnosis not present

## 2022-08-20 DIAGNOSIS — Z7984 Long term (current) use of oral hypoglycemic drugs: Secondary | ICD-10-CM | POA: Insufficient documentation

## 2022-08-20 DIAGNOSIS — R0789 Other chest pain: Secondary | ICD-10-CM | POA: Insufficient documentation

## 2022-08-20 DIAGNOSIS — Z794 Long term (current) use of insulin: Secondary | ICD-10-CM | POA: Insufficient documentation

## 2022-08-20 DIAGNOSIS — I10 Essential (primary) hypertension: Secondary | ICD-10-CM | POA: Insufficient documentation

## 2022-08-20 DIAGNOSIS — D72829 Elevated white blood cell count, unspecified: Secondary | ICD-10-CM | POA: Diagnosis not present

## 2022-08-20 DIAGNOSIS — D649 Anemia, unspecified: Secondary | ICD-10-CM

## 2022-08-20 DIAGNOSIS — R739 Hyperglycemia, unspecified: Secondary | ICD-10-CM

## 2022-08-20 LAB — COMPREHENSIVE METABOLIC PANEL
ALT: 13 U/L (ref 0–44)
AST: 13 U/L — ABNORMAL LOW (ref 15–41)
Albumin: 3.6 g/dL (ref 3.5–5.0)
Alkaline Phosphatase: 91 U/L (ref 38–126)
Anion gap: 11 (ref 5–15)
BUN: 6 mg/dL (ref 6–20)
CO2: 22 mmol/L (ref 22–32)
Calcium: 8.7 mg/dL — ABNORMAL LOW (ref 8.9–10.3)
Chloride: 99 mmol/L (ref 98–111)
Creatinine, Ser: 0.74 mg/dL (ref 0.44–1.00)
GFR, Estimated: >60 mL/min (ref >60)
Glucose, Bld: 471 mg/dL — ABNORMAL HIGH (ref 70–99)
Potassium: 3.9 mmol/L (ref 3.5–5.1)
Sodium: 132 mmol/L — ABNORMAL LOW (ref 135–145)
Total Bilirubin: <0.1 mg/dL — ABNORMAL LOW (ref 0.3–1.2)
Total Protein: 6.9 g/dL (ref 6.5–8.1)

## 2022-08-20 LAB — RESP PANEL BY RT-PCR (RSV, FLU A&B, COVID)  RVPGX2
Influenza A by PCR: NEGATIVE
Influenza B by PCR: NEGATIVE
Resp Syncytial Virus by PCR: NEGATIVE
SARS Coronavirus 2 by RT PCR: NEGATIVE

## 2022-08-20 LAB — CBC WITH DIFFERENTIAL/PLATELET
Abs Immature Granulocytes: 0.01 10*3/uL (ref 0.00–0.07)
Basophils Absolute: 0 10*3/uL (ref 0.0–0.1)
Basophils Relative: 0 %
Eosinophils Absolute: 0 10*3/uL (ref 0.0–0.5)
Eosinophils Relative: 1 %
HCT: 27.8 % — ABNORMAL LOW (ref 36.0–46.0)
Hemoglobin: 8.6 g/dL — ABNORMAL LOW (ref 12.0–15.0)
Immature Granulocytes: 0 %
Lymphocytes Relative: 40 %
Lymphs Abs: 2 10*3/uL (ref 0.7–4.0)
MCH: 20.6 pg — ABNORMAL LOW (ref 26.0–34.0)
MCHC: 30.9 g/dL (ref 30.0–36.0)
MCV: 66.5 fL — ABNORMAL LOW (ref 80.0–100.0)
Monocytes Absolute: 0.2 10*3/uL (ref 0.1–1.0)
Monocytes Relative: 4 %
Neutro Abs: 2.7 10*3/uL (ref 1.7–7.7)
Neutrophils Relative %: 55 %
Platelets: 323 10*3/uL (ref 150–400)
RBC: 4.18 MIL/uL (ref 3.87–5.11)
RDW: 15.7 % — ABNORMAL HIGH (ref 11.5–15.5)
WBC: 5.1 10*3/uL (ref 4.0–10.5)
nRBC: 0 % (ref 0.0–0.2)

## 2022-08-20 LAB — URINALYSIS, ROUTINE W REFLEX MICROSCOPIC
Bacteria, UA: NONE SEEN
Bilirubin Urine: NEGATIVE
Glucose, UA: >=500 mg/dL — AB
Ketones, ur: NEGATIVE mg/dL
Nitrite: NEGATIVE
Protein, ur: NEGATIVE mg/dL
Specific Gravity, Urine: 1.023 (ref 1.005–1.030)
pH: 6 (ref 5.0–8.0)

## 2022-08-20 LAB — CBG MONITORING, ED: Glucose-Capillary: 402 mg/dL — ABNORMAL HIGH (ref 70–99)

## 2022-08-20 LAB — I-STAT BETA HCG BLOOD, ED (MC, WL, AP ONLY): I-stat hCG, quantitative: <5 m[IU]/mL (ref <5)

## 2022-08-20 LAB — LIPASE, BLOOD: Lipase: 37 U/L (ref 11–51)

## 2022-08-20 LAB — TROPONIN I (HIGH SENSITIVITY)
Troponin I (High Sensitivity): <2 ng/L (ref <18)
Troponin I (High Sensitivity): <2 ng/L (ref <18)

## 2022-08-20 MED ORDER — INSULIN GLARGINE 100 UNIT/ML ~~LOC~~ SOLN: 15.0000 [IU] | Freq: Every day | SUBCUTANEOUS | 2 refills | Status: DC

## 2022-08-20 MED ORDER — FREESTYLE LIBRE 3 SENSOR MISC: 1.0000 | Freq: Once | 1 refills | Status: DC

## 2022-08-20 MED ORDER — INSULIN ASPART 100 UNIT/ML IJ SOLN
5.0000 [IU] | Freq: Once | INTRAMUSCULAR | Status: AC
  Administered 2022-08-21: 5 [IU] via SUBCUTANEOUS

## 2022-08-20 NOTE — ED Triage Notes (Signed)
Pt c/o generalized chest tightness, shob, nausea, and weakness x 1 week. Denies abdominal pain, fever, and cold sx. Endorses heavy menstrual bleeding since December. Pt endorses "tingling feeling in head" for approx 1 month.

## 2022-08-20 NOTE — ED Notes (Signed)
Called for vs x 3

## 2022-08-20 NOTE — ED Provider Triage Note (Signed)
Emergency Medicine Provider Triage Evaluation Note  [name] , [name]  was evaluated in triage.  Pt complains of chest tightness, shortness of breath, nausea, vomiting, fatigue for the past week.  She reports that she is also been having some vaginal bleeding for the past month but was more clots, and is subsided today.  She denies any fevers, cough, rhinorrhea, nasal ingestion, abdominal pain, or urinary symptoms..  Review of Systems  Positive:  Negative:   Physical Exam  BP 127/84 (BP Location: Left Arm)   Pulse 89   Temp 99.1 F (37.3 C)   Resp 14   Ht '5\' 9"'$  (1.753 m)   Wt 117.9 kg   SpO2 100%   BMI 38.40 kg/m  Gen:   Awake, no distress   Resp:  Normal effort  MSK:   Moves extremities without difficulty  Other:  Diffuse anterior chest tenderness palpation.  No crepitus.  Lung sounds are clear to auscultation.  Medical Decision Making  Medically screening exam initiated at 4:20 PM.  Appropriate orders placed.  [name] was informed that the remainder of the evaluation will be completed by another provider, this initial triage assessment does not replace that evaluation, and the importance of remaining in the ED until their evaluation is complete.  Chest labs ordered. CXR. EKG. Vitals stable.   Raven Puller, PA-C 08/20/22 1624

## 2022-08-20 NOTE — ED Notes (Signed)
Pt is not responding and cannot be found in the lobby. Called x3

## 2022-08-20 NOTE — ED Provider Notes (Signed)
[facility] EMERGENCY DEPARTMENT Provider Note   CSN: [id number] Arrival date & time: 08/20/22  1613     History  Chief Complaint  Patient presents with   Chest Pain    [name] is a 37 y.o. female.  HPI   Patient with medical history including asthma, diabetes, hypertension, presents to the Emergency Department with complaints of chest tightness.  Patient states chest tightness been going on for about 2 weeks time, states pain was intermittent but this has become more consistent, describes pain as chest pressure across her chest, she states occasionally feel short of breath, she denies any pleuritic chest pain no cough no congestion, she denies any limb swelling, she has no history of PEs or DVTs she is currently not oral birth control.  No recent surgeries no long immobilization, she has no cardiac history, but she is being treated for diabetes hypertension, she does note that she has some family history of heart issues.  She does not smoke.  She notes that she is homeless at the moment and has been without her diabetes medication.  She states that she takes Lantus at nighttime daily and just needs a refill on this.    Home Medications Prior to Admission medications   Medication Sig Start Date End Date Taking? Authorizing Provider  ferrous sulfate 325 (65 FE) MG tablet Take 1 tablet (325 mg total) by mouth daily. 08/20/22 09/19/22 Yes Marcello Fennel, PA-C  acetaminophen (TYLENOL) 500 MG tablet Take 1,000 mg by mouth every 8 (eight) hours as needed for moderate pain.    [provider]  albuterol (VENTOLIN HFA) 108 (90 Base) MCG/ACT inhaler Inhale 2 puffs into the lungs every 6 (six) hours as needed for wheezing or shortness of breath. 06/04/20   Nugent, Gerrie Nordmann, NP  Continuous Blood Gluc Sensor (FREESTYLE LIBRE 3 SENSOR) MISC 1 applicator by Does not apply route once for 1 dose. Place 1 sensor on the skin every 14 days. Use to check glucose  continuously 08/21/22 08/21/22  Marcello Fennel, PA-C  insulin glargine (LANTUS) 100 UNIT/ML injection Inject 0.15 mLs (15 Units total) into the skin daily. 08/20/22   Marcello Fennel, PA-C  NIFEdipine (ADALAT CC) 30 MG 24 hr tablet TAKE 1 TABLET (30 MG TOTAL) BY MOUTH DAILY. Patient not taking: Reported on 11/28/2020 09/13/20 09/13/21  Donnamae Jude, MD  NIFEdipine (PROCARDIA XL) 30 MG 24 hr tablet Take 1 tablet (30 mg total) by mouth daily. Patient not taking: Reported on 11/28/2020 10/11/20   Aletha Halim, MD  Vitamin D, Ergocalciferol, (DRISDOL) 1.25 MG (50000 UNIT) CAPS capsule Take 1 capsule (50,000 Units total) by mouth every 7 (seven) days. 11/29/20   Mayers, Cari S, PA-C  metFORMIN (GLUCOPHAGE) 1000 MG tablet Take 1 tablet (1,000 mg total) by mouth 2 (two) times daily with a meal. 09/13/20 10/14/20  Donnamae Jude, MD      Allergies    Patient has no known allergies.    Review of Systems   Review of Systems  Constitutional:  Negative for chills and fever.  Respiratory:  Positive for chest tightness. Negative for shortness of breath.   Cardiovascular:  Negative for chest pain.  Gastrointestinal:  Negative for abdominal pain.  Neurological:  Negative for headaches.    Physical Exam Updated Vital Signs BP (!) 102/58   Pulse 85   Temp 97.9 F (36.6 C) (Oral)   Resp 20   Ht '5\' 9"'$  (1.753 m)   Wt  117.9 kg   LMP 08/20/2022 (Approximate)   SpO2 100%   BMI 38.40 kg/m  Physical Exam Vitals and nursing note reviewed.  Constitutional:      General: She is not in acute distress.    Appearance: She is not ill-appearing.  HENT:     Head: Normocephalic and atraumatic.     Nose: No congestion.  Eyes:     Conjunctiva/sclera: Conjunctivae normal.  Cardiovascular:     Rate and Rhythm: Normal rate and regular rhythm.     Pulses: Normal pulses.     Heart sounds: No murmur heard.    No friction rub. No gallop.  Pulmonary:     Effort: No respiratory distress.     Breath sounds:  No wheezing, rhonchi or rales.  Abdominal:     Palpations: Abdomen is soft.  Musculoskeletal:     Right lower leg: No edema.     Left lower leg: No edema.     Comments: No unilateral leg swelling no calf tenderness no palpable cords.  Skin:    General: Skin is warm and dry.  Neurological:     Mental Status: She is alert.  Psychiatric:        Mood and Affect: Mood normal.     ED Results / Procedures / Treatments   Labs (all labs ordered are listed, but only abnormal results are displayed) Labs Reviewed  COMPREHENSIVE METABOLIC PANEL - Abnormal; Notable for the following components:      Result Value   Sodium 132 (*)    Glucose, Bld 471 (*)    Calcium 8.7 (*)    AST 13 (*)    Total Bilirubin <0.1 (*)    All other components within normal limits  CBC WITH DIFFERENTIAL/PLATELET - Abnormal; Notable for the following components:   Hemoglobin 8.6 (*)    HCT 27.8 (*)    MCV 66.5 (*)    MCH 20.6 (*)    RDW 15.7 (*)    All other components within normal limits  URINALYSIS, ROUTINE W REFLEX MICROSCOPIC - Abnormal; Notable for the following components:   Color, Urine COLORLESS (*)    Glucose, UA >=500 (*)    Hgb urine dipstick SMALL (*)    Leukocytes,Ua SMALL (*)    All other components within normal limits  CBG MONITORING, ED - Abnormal; Notable for the following components:   Glucose-Capillary 402 (*)    All other components within normal limits  RESP PANEL BY RT-PCR (RSV, FLU A&B, COVID)  RVPGX2  LIPASE, BLOOD  I-STAT BETA HCG BLOOD, ED ([facility], [facility], [facility] ONLY)  TROPONIN I (HIGH SENSITIVITY)  TROPONIN I (HIGH SENSITIVITY)    EKG None  Radiology DG Chest 2 View  Result Date: 08/20/2022 CLINICAL DATA:  Chest pain EXAM: CHEST - 2 VIEW COMPARISON:  Chest x-ray August 27, 2018 FINDINGS: The cardiomediastinal silhouette is unchanged in contour. No focal pulmonary opacity. No pleural effusion or pneumothorax. The visualized upper abdomen is unremarkable. No acute osseous  abnormality. IMPRESSION: No acute cardiopulmonary abnormality. Electronically Signed   By: Beryle Flock M.D.   On: 08/20/2022 17:28    Procedures Procedures    Medications Ordered in ED Medications  insulin aspart (novoLOG) injection 5 Units (has no administration in time range)    ED Course/ Medical Decision Making/ A&P             HEART Score: 1                Medical Decision  Making Amount and/or Complexity of Data Reviewed Radiology: ordered.  Risk Prescription drug management.   This patient presents to the ED for concern of chest pain, this involves an extensive number of treatment options, and is a complaint that carries with it a high risk of complications and morbidity.  The differential diagnosis includes ACS, PE, dissection DKA    Additional history obtained:  Additional history obtained from partner at bedside External records from outside source obtained and reviewed including internal medicine note   Co morbidities that complicate the patient evaluation  Diabetes, hypertension  Social Determinants of Health:  Homeless    Lab Tests:  I Ordered, and personally interpreted labs.  The pertinent results include: CBC shows microcytic anemia hemoglobin 9.6 slightly below baseline which is around 9 and 10, CMP shows sodium 132, glucose 471, AST 13, negative delta troponin, respiratory panel negative, lipase 37 UA shows small leukocytes red blood cells white blood cells no bacteria   Imaging Studies ordered:  I ordered imaging studies including chest x-ray I independently visualized and interpreted imaging which showed negative acute findings I agree with the radiologist interpretation   Cardiac Monitoring:  The patient was maintained on a cardiac monitor.  I personally viewed and interpreted the cardiac monitored which showed an underlying rhythm of: EKG without signs of ischemia   Medicines ordered and prescription drug management:  I ordered  medication including insulin I have reviewed the patients home medicines and have made adjustments as needed  Critical Interventions:  N/A   Reevaluation:  Presents with chest pain, triage obtain lab work imaging which I personally reviewed, significant findings include a slightly elevated glucose of 472, she had a benign physical exam, will provide patient with insulin, and discharge she is agreement this plan.   Consultations Obtained:  N/A    Test Considered:  N/A    Rule out I have low suspicion for ACS as history is atypical, patient has no cardiac history, EKG was sinus rhythm without signs of ischemia, patient had negative delta troponin.  Low suspicion for PE as patient denies pleuritic chest pain, shortness of breath, patient denies leg pain, no pedal edema noted on exam, patient was PERC negative.  Low suspicion for AAA or aortic dissection as history is atypical, patient has low risk factors.  Suspicion for DKA or HHS is low at this time as glucose is not significant elevated, there is no anion gap, no ketones noted in urine, no decrease in CO2.  I suspect the slight elevation is due to unable to take her insulin over the last month.  My suspicion for GI bleed is also low at this time as she does not Dors any dark tarry stools no bloody stools she has no history of this.  She endorses that she has heavy menstrual cycle, just recently ended I suspect this is likely cause of her decreased hemoglobin.  I do not feel patient needs blood transfusion at this time as she hemodynamically stable and she is not actively bleeding at this time she is also not on any anticoag's.    Dispostion and problem list  After consideration of the diagnostic results and the patients response to treatment, I feel that the patent would benefit from discharge.  Atypical chest pain-unclear etiology, possible this is from low hemoglobin causing her to feel short of breath, will have her follow-up  with her primary doctor for further assessment Normocytic anemia-likely from heavy menses, will start her on short course of iron  pills, follow-up with primary care doctor for further evaluation Hyperglycemia-no evidence of HHS or DKA, will refill her diabetes medication, refer her to community health and wellness for further evaluation.            Final Clinical Impression(s) / ED Diagnoses Final diagnoses:  Atypical chest pain  Hyperglycemia  Normocytic anemia    Rx / DC Orders ED Discharge Orders          Ordered    Continuous Blood Gluc Sensor (FREESTYLE LIBRE 3 SENSOR) MISC   Once        08/21/22 0000    insulin glargine (LANTUS) 100 UNIT/ML injection  Daily,   Status:  Discontinued        08/20/22 2308    Continuous Blood Gluc Sensor (FREESTYLE LIBRE 3 SENSOR) MISC   Once,   Status:  Discontinued        08/20/22 2308    ferrous sulfate 325 (65 FE) MG tablet  Daily        08/21/22 0000    insulin glargine (LANTUS) 100 UNIT/ML injection  Daily        08/21/22 0000              Marcello Fennel, PA-C 08/21/22 0002    Maudie Flakes, MD 08/21/22 [phone]

## 2022-08-21 MED ORDER — FERROUS SULFATE 325 (65 FE) MG PO TABS: 325.0000 mg | ORAL_TABLET | Freq: Every day | ORAL | 0 refills | Status: DC

## 2022-08-21 MED ORDER — FREESTYLE LIBRE 3 SENSOR MISC: 1.0000 | Freq: Once | 1 refills | Status: AC

## 2022-08-21 MED ORDER — INSULIN GLARGINE 100 UNIT/ML ~~LOC~~ SOLN: 15.0000 [IU] | Freq: Every day | SUBCUTANEOUS | 2 refills | Status: DC

## 2022-08-21 NOTE — Discharge Instructions (Signed)
Chest pain-possible this is coming from your low hemoglobin, I have placed you on iron pills please take as prescribed.  I would like you to follow-up with community health and wellness for further evaluation. Hyperglycemia-I have reviewed ordered your insulin medication please take as prescribed.  I would like you to follow-up with community health and wellness for further evaluation Low hemoglobin-suspect this is from your menstrual cycle of starting on 3 days of iron pills please take as prescribed.  This can make you constipated you may use MiraLAX as needed.  It also make your stools black looking.  Please follow-up with community health and wellness  Come back to the emergency department if you develop chest pain, shortness of breath, severe abdominal pain, uncontrolled nausea, vomiting, diarrhea.

## 2022-08-25 ENCOUNTER — Encounter (HOSPITAL_COMMUNITY): Payer: Self-pay | Admitting: Clinical

## 2022-08-25 ENCOUNTER — Encounter (HOSPITAL_COMMUNITY): Payer: Self-pay

## 2022-08-25 ENCOUNTER — Ambulatory Visit (INDEPENDENT_AMBULATORY_CARE_PROVIDER_SITE_OTHER): Payer: Medicaid Other | Admitting: Clinical

## 2022-08-25 DIAGNOSIS — F331 Major depressive disorder, recurrent, moderate: Secondary | ICD-10-CM

## 2022-08-25 DIAGNOSIS — Z5900 Homelessness unspecified: Secondary | ICD-10-CM | POA: Diagnosis not present

## 2022-08-25 DIAGNOSIS — Z630 Problems in relationship with spouse or partner: Secondary | ICD-10-CM | POA: Diagnosis not present

## 2022-08-25 DIAGNOSIS — F419 Anxiety disorder, unspecified: Secondary | ICD-10-CM | POA: Diagnosis not present
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
## 2022-08-30 ENCOUNTER — Emergency Department (HOSPITAL_COMMUNITY): Payer: Medicaid Other

## 2022-08-30 ENCOUNTER — Other Ambulatory Visit: Payer: Self-pay

## 2022-08-30 ENCOUNTER — Encounter (HOSPITAL_COMMUNITY): Payer: Self-pay

## 2022-08-30 ENCOUNTER — Emergency Department (HOSPITAL_COMMUNITY)
Admission: EM | Admit: 2022-08-30 | Discharge: 2022-08-30 | Disposition: A | Discharge status: Home / Self Care | Payer: Medicaid Other | Attending: Emergency Medicine | Admitting: Emergency Medicine

## 2022-08-30 DIAGNOSIS — N939 Abnormal uterine and vaginal bleeding, unspecified: Secondary | ICD-10-CM | POA: Insufficient documentation

## 2022-08-30 LAB — CBC WITH DIFFERENTIAL/PLATELET
Abs Immature Granulocytes: 0.01 10*3/uL (ref 0.00–0.07)
Basophils Absolute: 0 10*3/uL (ref 0.0–0.1)
Basophils Relative: 1 %
Eosinophils Absolute: 0.1 10*3/uL (ref 0.0–0.5)
Eosinophils Relative: 1 %
HCT: 27.8 % — ABNORMAL LOW (ref 36.0–46.0)
Hemoglobin: 8.6 g/dL — ABNORMAL LOW (ref 12.0–15.0)
Immature Granulocytes: 0 %
Lymphocytes Relative: 43 %
Lymphs Abs: 1.8 10*3/uL (ref 0.7–4.0)
MCH: 20.3 pg — ABNORMAL LOW (ref 26.0–34.0)
MCHC: 30.9 g/dL (ref 30.0–36.0)
MCV: 65.6 fL — ABNORMAL LOW (ref 80.0–100.0)
Monocytes Absolute: 0.3 10*3/uL (ref 0.1–1.0)
Monocytes Relative: 6 %
Neutro Abs: 2 10*3/uL (ref 1.7–7.7)
Neutrophils Relative %: 49 %
Platelets: 332 10*3/uL (ref 150–400)
RBC: 4.24 MIL/uL (ref 3.87–5.11)
RDW: 15.8 % — ABNORMAL HIGH (ref 11.5–15.5)
WBC: 4.1 10*3/uL (ref 4.0–10.5)
nRBC: 0 % (ref 0.0–0.2)

## 2022-08-30 LAB — BASIC METABOLIC PANEL
Anion gap: 10 (ref 5–15)
BUN: <5 mg/dL — ABNORMAL LOW (ref 6–20)
CO2: 24 mmol/L (ref 22–32)
Calcium: 8.6 mg/dL — ABNORMAL LOW (ref 8.9–10.3)
Chloride: 100 mmol/L (ref 98–111)
Creatinine, Ser: 0.69 mg/dL (ref 0.44–1.00)
GFR, Estimated: >60 mL/min (ref >60)
Glucose, Bld: 304 mg/dL — ABNORMAL HIGH (ref 70–99)
Potassium: 4.2 mmol/L (ref 3.5–5.1)
Sodium: 134 mmol/L — ABNORMAL LOW (ref 135–145)

## 2022-08-30 LAB — URINALYSIS, ROUTINE W REFLEX MICROSCOPIC
Bilirubin Urine: NEGATIVE
Glucose, UA: >=500 mg/dL — AB
Ketones, ur: NEGATIVE mg/dL
Nitrite: NEGATIVE
Protein, ur: 30 mg/dL — AB
RBC / HPF: >50 RBC/hpf (ref 0–5)
Specific Gravity, Urine: 1.024 (ref 1.005–1.030)
WBC, UA: >50 WBC/hpf (ref 0–5)
pH: 5 (ref 5.0–8.0)

## 2022-08-30 LAB — TYPE AND SCREEN
ABO/RH(D): A POS
Antibody Screen: NEGATIVE

## 2022-08-30 LAB — I-STAT BETA HCG BLOOD, ED (MC, WL, AP ONLY): I-stat hCG, quantitative: <5 m[IU]/mL (ref <5)

## 2022-08-30 LAB — CBG MONITORING, ED: Glucose-Capillary: 307 mg/dL — ABNORMAL HIGH (ref 70–99)

## 2022-08-30 MED ORDER — MEDROXYPROGESTERONE ACETATE 150 MG/ML IM SUSP
300.0000 mg | Freq: Once | INTRAMUSCULAR | Status: AC
  Administered 2022-08-30: 300 mg via INTRAMUSCULAR
  Filled 2022-08-30: qty 2

## 2022-08-30 MED ORDER — IBUPROFEN 400 MG PO TABS
600.0000 mg | ORAL_TABLET | Freq: Once | ORAL | Status: AC
  Administered 2022-08-30: 600 mg via ORAL
  Filled 2022-08-30: qty 1

## 2022-08-30 MED ORDER — IBUPROFEN 600 MG PO TABS: 600.0000 mg | ORAL_TABLET | Freq: Four times a day (QID) | ORAL | 0 refills | Status: DC | PRN

## 2022-08-30 NOTE — ED Provider Notes (Signed)
Patient left without being seen after check-in, before MSE/Triage.   Note: Portions of this report may have been transcribed using voice recognition software. Every effort was made to ensure accuracy; however, inadvertent computerized transcription errors may still be present.    [name] 08/30/22 [id number]    Ezequiel Essex, MD 08/30/22 [phone]

## 2022-08-30 NOTE — Discharge Instructions (Addendum)
It was a pleasure taking care of you today!   Your labs didn't show any emergent concerning findings. Your ultrasound showed concern for a mass to the cervical canal. You were given a dose of depo-provera in the ED today. It is important that you follow-up with OB/GYN specialist next week for close follow-up.  Attached are 3 different OB/GYN specialist in the area that she can follow-up with.  Follow-up with your primary care provider as needed.  Return to the ED if you experience increasing/worsening symptoms.

## 2022-08-30 NOTE — ED Provider Notes (Signed)
[facility] Provider Note   CSN: [id number] Arrival date & time: 08/30/22  3785     History  Chief Complaint  Patient presents with   Vaginal Bleeding    [name] is a 37 y.o. female who presents emergency department with concerns for vaginal bleeding onset 2 months.  Does not have OB/GYN at this time.  Notes that she goes through at least 2 pads and 1 tampon within an hour.  Denies concerns for STI or pregnancy at this time.  Notes history of similar symptoms as well as history of anemia.  Was instructed to take iron supplements however was unable to afford the medication.  Denies dysuria, nausea, vomiting, abdominal pain.  The history is provided by the patient. No language interpreter was used.       Home Medications Prior to Admission medications   Medication Sig Start Date End Date Taking? Authorizing Provider  ibuprofen (ADVIL) 600 MG tablet Take 1 tablet (600 mg total) by mouth every 6 (six) hours as needed. 08/30/22  Yes Smantha Boakye A, PA-C  acetaminophen (TYLENOL) 500 MG tablet Take 1,000 mg by mouth every 8 (eight) hours as needed for moderate pain.    [provider]  albuterol (VENTOLIN HFA) 108 (90 Base) MCG/ACT inhaler Inhale 2 puffs into the lungs every 6 (six) hours as needed for wheezing or shortness of breath. 06/04/20   Nugent, Gerrie Nordmann, NP  ferrous sulfate 325 (65 FE) MG tablet Take 1 tablet (325 mg total) by mouth daily. 08/20/22 09/19/22  Marcello Fennel, PA-C  insulin glargine (LANTUS) 100 UNIT/ML injection Inject 0.15 mLs (15 Units total) into the skin daily. 08/20/22   Marcello Fennel, PA-C  NIFEdipine (ADALAT CC) 30 MG 24 hr tablet TAKE 1 TABLET (30 MG TOTAL) BY MOUTH DAILY. Patient not taking: Reported on 11/28/2020 09/13/20 09/13/21  Donnamae Jude, MD  NIFEdipine (PROCARDIA XL) 30 MG 24 hr tablet Take 1 tablet (30 mg total) by mouth daily. Patient not taking: Reported on 11/28/2020 10/11/20    Aletha Halim, MD  Vitamin D, Ergocalciferol, (DRISDOL) 1.25 MG (50000 UNIT) CAPS capsule Take 1 capsule (50,000 Units total) by mouth every 7 (seven) days. 11/29/20   Mayers, Cari S, PA-C  metFORMIN (GLUCOPHAGE) 1000 MG tablet Take 1 tablet (1,000 mg total) by mouth 2 (two) times daily with a meal. 09/13/20 10/14/20  Donnamae Jude, MD      Allergies    Patient has no known allergies.    Review of Systems   Review of Systems  Genitourinary:  Positive for vaginal bleeding.  All other systems reviewed and are negative.   Physical Exam Updated Vital Signs BP 120/83 (BP Location: Left Arm)   Pulse 84   Temp 98.2 F (36.8 C)   Resp 16   Ht '5\' 8"'$  (1.727 m)   Wt 108.9 kg   LMP 08/20/2022 (Approximate)   SpO2 100%   BMI 36.49 kg/m  Physical Exam Vitals and nursing note reviewed. Exam conducted with a chaperone present.  Constitutional:      General: She is not in acute distress.    Appearance: Normal appearance. She is not ill-appearing, toxic-appearing or diaphoretic.  HENT:     Head: Normocephalic and atraumatic.     Right Ear: External ear normal.     Left Ear: External ear normal.  Eyes:     General: No scleral icterus.    Extraocular Movements: Extraocular movements intact.  Cardiovascular:     Rate and Rhythm: Normal rate and regular rhythm.     Pulses: Normal pulses.     Heart sounds: Normal heart sounds.  Pulmonary:     Effort: Pulmonary effort is normal. No respiratory distress.     Breath sounds: Normal breath sounds.  Abdominal:     General: Abdomen is flat. Bowel sounds are normal. There is no distension.     Palpations: Abdomen is soft. There is no mass.     Tenderness: There is no abdominal tenderness.     Hernia: There is no hernia in the left inguinal area or right inguinal area.     Comments: No abdominal tenderness to palpation.  Genitourinary:    Pubic Area: No rash.      Labia:        Right: No rash, tenderness, lesion or injury.        Left: No  rash, tenderness, lesion or injury.      Vagina: No signs of injury and foreign body. Bleeding present. No vaginal discharge, erythema or tenderness.     Cervix: Normal.     Uterus: Normal. Not deviated, not enlarged, not fixed and not tender.      Adnexa: Right adnexa normal and left adnexa normal.     Comments: RN chaperone present for exam.  Clots noted in vaginal vault. Musculoskeletal:        General: Normal range of motion.     Cervical back: Normal range of motion and neck supple.  Lymphadenopathy:     Lower Body: No right inguinal adenopathy. No left inguinal adenopathy.  Skin:    General: Skin is warm and dry.  Neurological:     Mental Status: She is alert.     ED Results / Procedures / Treatments   Labs (all labs ordered are listed, but only abnormal results are displayed) Labs Reviewed  BASIC METABOLIC PANEL - Abnormal; Notable for the following components:      Result Value   Sodium 134 (*)    Glucose, Bld 304 (*)    BUN <5 (*)    Calcium 8.6 (*)    All other components within normal limits  CBC WITH DIFFERENTIAL/PLATELET - Abnormal; Notable for the following components:   Hemoglobin 8.6 (*)    HCT 27.8 (*)    MCV 65.6 (*)    MCH 20.3 (*)    RDW 15.8 (*)    All other components within normal limits  URINALYSIS, ROUTINE W REFLEX MICROSCOPIC - Abnormal; Notable for the following components:   APPearance HAZY (*)    Glucose, UA >=500 (*)    Hgb urine dipstick LARGE (*)    Protein, ur 30 (*)    Leukocytes,Ua TRACE (*)    Bacteria, UA RARE (*)    All other components within normal limits  CBG MONITORING, ED - Abnormal; Notable for the following components:   Glucose-Capillary 307 (*)    All other components within normal limits  I-STAT BETA HCG BLOOD, ED ([facility], [facility], [facility] ONLY)  CBG MONITORING, ED  TYPE AND SCREEN    EKG None  Radiology US Pelvis Complete  Result Date: 08/30/2022 CLINICAL DATA:  Vaginal bleeding. EXAM: TRANSABDOMINAL AND TRANSVAGINAL  ULTRASOUND OF PELVIS TECHNIQUE: Both transabdominal and transvaginal ultrasound examinations of the pelvis were performed. Transabdominal technique was performed for global imaging of the pelvis including uterus, ovaries, adnexal regions, and pelvic cul-de-sac. It was necessary to proceed with endovaginal exam following the transabdominal exam to  visualize the ovaries and better visualize the uterus and endometrium. COMPARISON:  06/22/2014 FINDINGS: Uterus Measurements: 11.5 x 6.7 x 6.2 cm = volume: 249 mL. Diffusely heterogeneous. At least 3 defined oval masses measuring up to 2.2 cm in maximum diameter each. These are within the myometrium with no visible submucosal component. There is also a predominantly oval, heterogeneous, mass-like area within the cervix including the cervical canal measuring 5.6 x 3.7 x 2.8 cm with internal blood flow with color Doppler. Endometrium Thickness: 6.9 mm.  No focal abnormality visualized. Right ovary Measurements: 3.9 x 3.8 x 1.5 cm = volume: 11 mL. Normal appearance/no adnexal mass. Left ovary Measurements: 3.1 x 2.8 x 1.8 cm = volume: 8 mL. Normal appearance/no adnexal mass. Other findings No abnormal free fluid. IMPRESSION: 1. 5.6 x 3.7 x 2.8 cm heterogeneous mass in the cervix and cervical canal extending superiorly into the inferior aspect of the lower uterine segment. This is nonspecific with differential considerations including malignancy and an unusually low positioned fibroid. An elective pre and postcontrast MRI of the pelvis would provide additional information if clinically indicated. 2. Diffusely heterogeneous uterus containing multiple small fibroids. 3. Normal-appearing ovaries. Electronically Signed   By: Claudie Revering M.D.   On: 08/30/2022 14:17   US Transvaginal Non-OB  Result Date: 08/30/2022 CLINICAL DATA:  Vaginal bleeding. EXAM: TRANSABDOMINAL AND TRANSVAGINAL ULTRASOUND OF PELVIS TECHNIQUE: Both transabdominal and transvaginal ultrasound examinations  of the pelvis were performed. Transabdominal technique was performed for global imaging of the pelvis including uterus, ovaries, adnexal regions, and pelvic cul-de-sac. It was necessary to proceed with endovaginal exam following the transabdominal exam to visualize the ovaries and better visualize the uterus and endometrium. COMPARISON:  06/22/2014 FINDINGS: Uterus Measurements: 11.5 x 6.7 x 6.2 cm = volume: 249 mL. Diffusely heterogeneous. At least 3 defined oval masses measuring up to 2.2 cm in maximum diameter each. These are within the myometrium with no visible submucosal component. There is also a predominantly oval, heterogeneous, mass-like area within the cervix including the cervical canal measuring 5.6 x 3.7 x 2.8 cm with internal blood flow with color Doppler. Endometrium Thickness: 6.9 mm.  No focal abnormality visualized. Right ovary Measurements: 3.9 x 3.8 x 1.5 cm = volume: 11 mL. Normal appearance/no adnexal mass. Left ovary Measurements: 3.1 x 2.8 x 1.8 cm = volume: 8 mL. Normal appearance/no adnexal mass. Other findings No abnormal free fluid. IMPRESSION: 1. 5.6 x 3.7 x 2.8 cm heterogeneous mass in the cervix and cervical canal extending superiorly into the inferior aspect of the lower uterine segment. This is nonspecific with differential considerations including malignancy and an unusually low positioned fibroid. An elective pre and postcontrast MRI of the pelvis would provide additional information if clinically indicated. 2. Diffusely heterogeneous uterus containing multiple small fibroids. 3. Normal-appearing ovaries. Electronically Signed   By: Claudie Revering M.D.   On: 08/30/2022 14:17    Procedures Procedures    Medications Ordered in ED Medications  medroxyPROGESTERone (DEPO-PROVERA) injection 300 mg (has no administration in time range)  ibuprofen (ADVIL) tablet 600 mg (has no administration in time range)    ED Course/ Medical Decision Making/ A&P Clinical Course as of  08/30/22 1517  Sun Aug 30, 2022  1438 Consult with OB/GYN, Dr. Elonda Husky who recommends 300 mg Depo-Provera IM and follow-up in the office next week for further evaluation with examination and possible biopsy.  [SB]  1453 In-depth conversation held with patient regarding findings from ultrasound as well as recommendations as per OB/GYN specialist.  Answered all availble questions.  Patient appears safe for discharge at this time. [SB]    Clinical Course User Index [SB] Casandra Dallaire A, PA-C                             Medical Decision Making Amount and/or Complexity of Data Reviewed Labs: ordered. Radiology: ordered.  Risk Prescription drug management.   Patient is a [id number] presents to the ED with vaginal bleeding x 2 months. Patient's last menstrual period was 08/20/2022 (approximate). No concerns for STI or pregnancy at this time. Doesn't have an OBGYN. Vitals signs pt afebrile. On exam pt with RN chaperone present for GU exam, notable for vaginal bleeding with clots noted. Otherwise no acute cardiovascular, respiratory exam findings.  Differential diagnosis includes ectopic pregnancy, miscarriage, abnormal uterine bleeding, implantation bleeding.     Labs:  I ordered, and personally interpreted labs.  The pertinent results include:   I-stat hCG negative.  CBC without leukocytosis, hemoglobin stable at 8.6 BMP with elevated glucose at 304 otherwise unremarkable. Pt didn't take her insulin today PTA. Urinalysis notable for large amount of hemoglobin  Imaging: I ordered imaging studies including pelvic ultrasound   I independently visualized and interpreted imaging which showed:  1. 5.6 x 3.7 x 2.8 cm heterogeneous mass in the cervix and cervical  canal extending superiorly into the inferior aspect of the lower  uterine segment. This is nonspecific with differential  considerations including malignancy and an unusually low positioned  fibroid. An elective pre and postcontrast MRI of  the pelvis would  provide additional information if clinically indicated.  2. Diffusely heterogeneous uterus containing multiple small  fibroids.  3. Normal-appearing ovaries.   I agree with the radiologist interpretation  Medications:  I ordered medication including Depo Provera and ibuprofen for symptom management I have reviewed the patients home medicines and have made adjustments as needed  Consultations: I requested consultation with the OB-GYN, Dr. Elonda Husky and discussed lab and imaging findings as well as pertinent plan - they recommend: 300 mg Depo-Provera IM x 1 and close follow-up in the office next week   Disposition: Patient presentation suspicious for abnormal uterine bleeding versus miscarriage. Doubt ectopic pregnancy.  After consideration of the diagnostic results and the patients response to treatment, I feel that the patient would benefit from Discharge home. Discussed lab and imaging findings with patient at bedside. Answered all questions at bedside. Patient given information for [facility] to set up follow up appointment. Patient agreeable to discharge treatment plan.  Supportive care measures and strict return precautions discussed with patient at bedside.  Patient appears safe for discharge at this time.  Follow-up as indicated in discharge paperwork.   This chart was dictated using voice recognition software, Dragon. Despite the best efforts of this provider to proofread and correct errors, errors may still occur which can change documentation meaning.    Final Clinical Impression(s) / ED Diagnoses Final diagnoses:  Abnormal uterine bleeding (AUB)    Rx / DC Orders ED Discharge Orders          Ordered    ibuprofen (ADVIL) 600 MG tablet  Every 6 hours PRN        08/30/22 1458              Terrie Haring A, PA-C 08/30/22 [place], Ankit, MD 08/30/22 1540

## 2022-08-30 NOTE — ED Notes (Signed)
Called pt for triage x2 no response

## 2022-08-30 NOTE — ED Triage Notes (Signed)
Pt arrived POV from home c/o vaginal bleeding x2 months that stopped for a couple days and started back up. Pt states she is bleeding through a tampon and 2 pads. Pt states she is passing lots of clots.

## 2022-08-30 NOTE — ED Provider Triage Note (Signed)
Emergency Medicine Provider Triage Evaluation Note  [name] , a 37 y.o. female  was evaluated in triage.  Pt complains of vaginal bleeding onset 2 months.  Does not have OB/GYN at this time.  Denies concerns for STI or pregnancy at this time.  Denies dysuria, nausea, vomiting, abdominal pain.  Notes history of similar symptoms as well as history of anemia.  Was instructed to take iron supplements however was unable to afford the medication.  Review of Systems  Positive:  Negative:   Physical Exam  BP 120/75 (BP Location: Right Arm)   Pulse 86   Temp 98 F (36.7 C) (Oral)   Resp 18   Ht '5\' 8"'$  (1.727 m)   Wt 108.9 kg   LMP 08/20/2022 (Approximate)   SpO2 100%   BMI 36.49 kg/m  Gen:   Awake, no distress   Resp:  Normal effort  MSK:   Moves extremities without difficulty  Other:  No abdominal tenderness to palpation.   Medical Decision Making  Medically screening exam initiated at 7:50 AM.  Appropriate orders placed.  EUGENE ZEIDERS was informed that the remainder of the evaluation will be completed by another provider, this initial triage assessment does not replace that evaluation, and the importance of remaining in the ED until their evaluation is complete.  Workup initiated   Odelia Graciano A, PA-C 08/30/22 [phone]

## 2022-08-30 NOTE — ED Notes (Signed)
Pt taken to ultrasound

## 2022-09-08 ENCOUNTER — Encounter (HOSPITAL_COMMUNITY): Payer: Self-pay | Admitting: Physician Assistant

## 2022-09-08 ENCOUNTER — Ambulatory Visit (INDEPENDENT_AMBULATORY_CARE_PROVIDER_SITE_OTHER): Payer: Medicaid Other | Admitting: Physician Assistant

## 2022-09-08 DIAGNOSIS — Z630 Problems in relationship with spouse or partner: Secondary | ICD-10-CM | POA: Diagnosis not present

## 2022-09-08 DIAGNOSIS — Z5900 Homelessness unspecified: Secondary | ICD-10-CM | POA: Diagnosis not present

## 2022-09-08 DIAGNOSIS — F419 Anxiety disorder, unspecified: Secondary | ICD-10-CM | POA: Diagnosis not present

## 2022-09-08 DIAGNOSIS — F331 Major depressive disorder, recurrent, moderate: Secondary | ICD-10-CM

## 2022-09-08 MED ORDER — SERTRALINE HCL 50 MG PO TABS: 50.0000 mg | ORAL_TABLET | Freq: Every day | ORAL | 1 refills | Status: DC
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
## 2022-09-09 ENCOUNTER — Other Ambulatory Visit: Payer: Self-pay

## 2022-09-09 ENCOUNTER — Other Ambulatory Visit (HOSPITAL_COMMUNITY)
Admission: RE | Admit: 2022-09-09 | Discharge: 2022-09-09 | Disposition: A | Discharge status: Home / Self Care | Payer: Medicaid Other | Source: Ambulatory Visit | Attending: Obstetrics & Gynecology | Admitting: Obstetrics & Gynecology

## 2022-09-09 ENCOUNTER — Ambulatory Visit (INDEPENDENT_AMBULATORY_CARE_PROVIDER_SITE_OTHER): Payer: Commercial Managed Care - HMO | Admitting: Obstetrics & Gynecology

## 2022-09-09 ENCOUNTER — Encounter: Payer: Self-pay | Admitting: Obstetrics & Gynecology

## 2022-09-09 VITALS — BP 120/85 | HR 106 | Wt 238.3 lb

## 2022-09-09 DIAGNOSIS — D250 Submucous leiomyoma of uterus: Secondary | ICD-10-CM

## 2022-09-09 DIAGNOSIS — N938 Other specified abnormal uterine and vaginal bleeding: Secondary | ICD-10-CM

## 2022-09-09 DIAGNOSIS — E119 Type 2 diabetes mellitus without complications: Secondary | ICD-10-CM

## 2022-09-09 MED ORDER — PRENATAL 28-0.8 MG PO TABS: 1.0000 | ORAL_TABLET | Freq: Every day | ORAL | 12 refills | Status: DC

## 2022-09-09 NOTE — Progress Notes (Signed)
Patient ID: [name], female   DOB: February 14, 1986, 37 y.o.   MRN: [id number]  Chief Complaint  Patient presents with   Abnormal Uterine Bleeding    HPI [name] is a 37 y.o. female.  [id number] Patient's last menstrual period was 08/20/2022 (approximate). She wen to ED 1/28 with vaginal bleeding onset 2 months. She received IM DMPA and was referred here. The bleeding improved initially increased recently. HPI  Past Medical History:  Diagnosis Date   Asthma    COVID-19 07/04/2020   05/04/20   Diabetes mellitus without complication ([place])    DKA (diabetic ketoacidoses) 08/27/2018   Family history of diabetes mellitus in mother 01/10/2018   Fibroid    pedunculated posterior RUQ near fundus. approx 3-4cm at time of 01/2016 c-section   GBS (group B streptococcus) infection    Hearing loss    pt states she was tested in high school and told it was selective she says she continues to have a deficit and does not feel it is selective   Herpes    Herpes simplex vulvovaginitis 09/06/2020   Hypertension    Newly diagnosed diabetes ([place]) 01/10/2018   Diagnosed 01/10/18  A1c 7.5%   Sickle cell trait ([place]) 09/01/2014   '[ ]'$  ucx q trimester   Trichomonas vaginitis     Past Surgical History:  Procedure Laterality Date   CESAREAN SECTION N/A 01/08/2016   Procedure: CESAREAN SECTION;  Surgeon: Aletha Halim, MD;  Location: [facility];  Service: Obstetrics;  Laterality: N/A;   CESAREAN SECTION N/A 09/10/2020   Procedure: CESAREAN SECTION;  Surgeon: Truett Mainland, DO;  Location: [place] LD ORS;  Service: Obstetrics;  Laterality: N/A;   DILATION AND CURETTAGE OF UTERUS     TUBAL LIGATION  09/10/2020   Procedure: BILATERAL TUBAL LIGATION;  Surgeon: Truett Mainland, DO;  [facility];  Service: Obstetrics;;   WISDOM TOOTH EXTRACTION      Family History  Problem Relation Age of Onset   Diabetes Mother    Hypertension Mother    Hyperlipidemia Mother    Heart disease Mother     Breast cancer Mother        31   Ovarian cancer Mother        Unknown age   Diabetes Sister    Diabetes Brother    Breast cancer Cousin    Heart disease Maternal Grandmother     Social History Social History   Tobacco Use   Smoking status: Former    Types: Cigarettes   Smokeless tobacco: Never   Tobacco comments:    quit 2013  Vaping Use   Vaping Use: Never used  Substance Use Topics   Alcohol use: No   Drug use: No    No Known Allergies  Current Outpatient Medications  Medication Sig Dispense Refill   insulin aspart (NOVOLOG) 100 UNIT/ML injection Inject 38 Units into the skin 3 (three) times daily before meals.     insulin glargine (LANTUS) 100 UNIT/ML injection Inject 0.15 mLs (15 Units total) into the skin daily. 10 mL 2   sertraline (ZOLOFT) 50 MG tablet Take 1 tablet (50 mg total) by mouth daily. 30 tablet 1   acetaminophen (TYLENOL) 500 MG tablet Take 1,000 mg by mouth every 8 (eight) hours as needed for moderate pain. (Patient not taking: Reported on 09/09/2022)     albuterol (VENTOLIN HFA) 108 (90 Base) MCG/ACT inhaler Inhale 2 puffs into the lungs every 6 (six) hours as  needed for wheezing or shortness of breath. (Patient not taking: Reported on 09/09/2022) 1 each 1   ferrous sulfate 325 (65 FE) MG tablet Take 1 tablet (325 mg total) by mouth daily. (Patient not taking: Reported on 09/09/2022) 30 tablet 0   ibuprofen (ADVIL) 600 MG tablet Take 1 tablet (600 mg total) by mouth every 6 (six) hours as needed. 30 tablet 0   NIFEdipine (ADALAT CC) 30 MG 24 hr tablet TAKE 1 TABLET (30 MG TOTAL) BY MOUTH DAILY. (Patient not taking: Reported on 11/28/2020) 30 tablet 0   NIFEdipine (PROCARDIA XL) 30 MG 24 hr tablet Take 1 tablet (30 mg total) by mouth daily. (Patient not taking: Reported on 11/28/2020) 60 tablet 0   Vitamin D, Ergocalciferol, (DRISDOL) 1.25 MG (50000 UNIT) CAPS capsule Take 1 capsule (50,000 Units total) by mouth every 7 (seven) days. (Patient not taking: Reported  on 09/09/2022) 4 capsule 2   No current facility-administered medications for this visit.    Review of Systems Review of Systems  Constitutional: Negative.   Respiratory: Negative.    Cardiovascular: Negative.   Genitourinary:  Positive for menstrual problem and vaginal bleeding. Negative for pelvic pain.  Skin: Negative.     Blood pressure 120/85, pulse (!) 106, weight 238 lb 4.8 oz (108.1 kg), last menstrual period 08/20/2022, not currently breastfeeding.  Physical Exam Physical Exam Vitals and nursing note reviewed. Exam conducted with a chaperone present.  Constitutional:      Appearance: Normal appearance.  HENT:     Head: Normocephalic and atraumatic.  Abdominal:     General: Abdomen is flat.     Palpations: Abdomen is soft.  Genitourinary:    General: Normal vulva.     Exam position: Lithotomy position.     Vagina: Bleeding present.     Cervix: Lesion present. Discharge: prolapsed fibroid 4 cm. Skin:    General: Skin is warm and dry.  Neurological:     Mental Status: She is alert.  Psychiatric:        Mood and Affect: Mood normal.        Behavior: Behavior normal.   She consented to removal of the fibroid and it was twisted off with no active bleeding tolerated by the patient.  Data Reviewed ED notes  Assessment DUB, uterine fibroids  Plan F/U in 6 weeks Meds ordered this encounter  Medications   Prenatal 28-0.8 MG TABS    Sig: Take 1 tablet by mouth daily.    Dispense:  30 tablet    Refill:  12       Emeterio Reeve 09/09/2022, 2:57 PM

## 2022-09-09 NOTE — Progress Notes (Signed)
PHQ-9 and GAD-7 positive today; denies SI. Following with [facility].  Apolonio Schneiders RN

## 2022-09-11 LAB — SURGICAL PATHOLOGY

## 2022-09-15 ENCOUNTER — Ambulatory Visit (INDEPENDENT_AMBULATORY_CARE_PROVIDER_SITE_OTHER): Payer: Commercial Managed Care - HMO | Admitting: Family

## 2022-09-15 ENCOUNTER — Encounter: Payer: Self-pay | Admitting: Family

## 2022-09-15 VITALS — BP 128/84 | HR 86 | Temp 97.3°F | Resp 18 | Ht 68.0 in | Wt 242.0 lb

## 2022-09-15 DIAGNOSIS — H9193 Unspecified hearing loss, bilateral: Secondary | ICD-10-CM

## 2022-09-15 DIAGNOSIS — I10 Essential (primary) hypertension: Secondary | ICD-10-CM | POA: Diagnosis not present

## 2022-09-15 DIAGNOSIS — J4520 Mild intermittent asthma, uncomplicated: Secondary | ICD-10-CM

## 2022-09-15 DIAGNOSIS — Z7689 Persons encountering health services in other specified circumstances: Secondary | ICD-10-CM

## 2022-09-15 DIAGNOSIS — F411 Generalized anxiety disorder: Secondary | ICD-10-CM

## 2022-09-15 DIAGNOSIS — F419 Anxiety disorder, unspecified: Secondary | ICD-10-CM

## 2022-09-15 DIAGNOSIS — E119 Type 2 diabetes mellitus without complications: Secondary | ICD-10-CM | POA: Diagnosis not present

## 2022-09-15 DIAGNOSIS — E785 Hyperlipidemia, unspecified: Secondary | ICD-10-CM

## 2022-09-15 DIAGNOSIS — F331 Major depressive disorder, recurrent, moderate: Secondary | ICD-10-CM

## 2022-09-15 DIAGNOSIS — Z5900 Homelessness unspecified: Secondary | ICD-10-CM

## 2022-09-15 DIAGNOSIS — D509 Iron deficiency anemia, unspecified: Secondary | ICD-10-CM

## 2022-09-15 DIAGNOSIS — E559 Vitamin D deficiency, unspecified: Secondary | ICD-10-CM

## 2022-09-15 MED ORDER — VITAMIN D (ERGOCALCIFEROL) 1.25 MG (50000 UNIT) PO CAPS: 50000.0000 [IU] | ORAL_CAPSULE | ORAL | 2 refills | Status: DC

## 2022-09-15 MED ORDER — SERTRALINE HCL 50 MG PO TABS: 50.0000 mg | ORAL_TABLET | Freq: Every day | ORAL | 1 refills | Status: DC

## 2022-09-15 MED ORDER — ATORVASTATIN CALCIUM 20 MG PO TABS: 20.0000 mg | ORAL_TABLET | Freq: Every day | ORAL | 1 refills | Status: DC

## 2022-09-15 MED ORDER — FERROUS SULFATE 325 (65 FE) MG PO TBEC: 325.0000 mg | DELAYED_RELEASE_TABLET | Freq: Two times a day (BID) | ORAL | 1 refills | Status: DC

## 2022-09-15 MED ORDER — CYCLOBENZAPRINE HCL 10 MG PO TABS: 10.0000 mg | ORAL_TABLET | ORAL | 1 refills | Status: DC | PRN

## 2022-09-15 MED ORDER — ALBUTEROL SULFATE HFA 108 (90 BASE) MCG/ACT IN AERS: 2.0000 | INHALATION_SPRAY | Freq: Four times a day (QID) | RESPIRATORY_TRACT | 1 refills | Status: DC | PRN

## 2022-09-15 MED ORDER — BASAGLAR KWIKPEN 100 UNIT/ML ~~LOC~~ SOPN: 24.0000 [IU] | PEN_INJECTOR | Freq: Every day | SUBCUTANEOUS | 2 refills | Status: DC

## 2022-09-15 MED ORDER — FREESTYLE LIBRE 3 READER DEVI: 1.0000 | Freq: Four times a day (QID) | 5 refills | Status: DC

## 2022-09-15 NOTE — Progress Notes (Signed)
Provider: Marlowe Sax FNP-C   Pranav Lince, Nelda Bucks, NP  Patient Care Team: Mirage Pfefferkorn, Nelda Bucks, NP as PCP - General (Family Medicine)  Extended Emergency Contact Information Primary Emergency Contact: [facility] Phone: [phone] Relation: Significant other  Code Status:  Full Code  Goals of care: Advanced Directive information    09/15/2022    8:36 AM  Advanced Directives  Does Patient Have a Medical Advance Directive? No  Would patient like information on creating a medical advance directive? No - Patient declined     Chief Complaint  Patient presents with   Establish Care    New patient here to establish care    HPI:  Pt is a 37 y.o. female seen today establish care here at [facility] and Adult  care for medical management of chronic diseases.Has a medical history of Type 2 DM,Essential Hypertension,Hyperlipidemia,Generalized anxiety Disorder,Major depression,Mild intermittent Asthma, Vitamin D deficiency, Obesity among others.states moved from [place] in [place].   Hypertension - states B/p at home 110's/60 's - 120's/80's.States not currently on Blood pressure medication.she denies any headache,dizziness,vision changes,fatigue,chest tightness,palpitation,chest pain or shortness of breath.     Type 2 DM - states CBG runs in the 300's - 400's both fasting and non fasting.States using Novolog 38 units and lantus 20 -24 units states was trying to stretch medication since she had no PCP since she moved. Has numbness and tinging on the hands and head too.    Due for Annual eye and Foot exam   Asthma - Has been flaring up lately but does not her inhaler.   Depression /Anxiety - on Zoloft states under stress due to being homeless.currently living in a hotel but states was first put in a place where they had bed bug which was biting the kids. She started a new Job this week and hoping to find a shelter to move with the family.states has had a hard  time with the Husband being sick and going in and out of the hospital.   Past Medical History:  Diagnosis Date   Asthma    COVID-19 07/04/2020   05/04/20   Diabetes mellitus without complication ([place])    DKA (diabetic ketoacidoses) 08/27/2018   Family history of diabetes mellitus in mother 01/10/2018   Fibroid    pedunculated posterior RUQ near fundus. approx 3-4cm at time of 01/2016 c-section   GBS (group B streptococcus) infection    Hearing loss    pt states she was tested in high school and told it was selective she says she continues to have a deficit and does not feel it is selective   Herpes    Herpes simplex vulvovaginitis 09/06/2020   Hypertension    Newly diagnosed diabetes ([place]) 01/10/2018   Diagnosed 01/10/18  A1c 7.5%   Sickle cell trait ([place]) 09/01/2014   [ ]$  ucx q trimester   Trichomonas vaginitis    Past Surgical History:  Procedure Laterality Date   CESAREAN SECTION N/A 01/08/2016   Procedure: CESAREAN SECTION;  Surgeon: Aletha Halim, MD;  Location: [place];  Service: Obstetrics;  Laterality: N/A;   CESAREAN SECTION N/A 09/10/2020   Procedure: CESAREAN SECTION;  Surgeon: Truett Mainland, DO;  Location: [facility];  Service: Obstetrics;  Laterality: N/A;   DILATION AND CURETTAGE OF UTERUS     TUBAL LIGATION  09/10/2020   Procedure: BILATERAL TUBAL LIGATION;  Surgeon: Truett Mainland, DO;  Location: [facility];  Service: Obstetrics;;   WISDOM TOOTH  EXTRACTION      No Known Allergies  Allergies as of 09/15/2022   No Known Allergies      Medication List        Accurate as of September 15, 2022  9:22 AM. If you have any questions, ask your nurse or doctor.          STOP taking these medications    acetaminophen 500 MG tablet Commonly known as: TYLENOL Stopped by: Sandrea Hughs, NP       TAKE these medications    albuterol 108 (90 Base) MCG/ACT inhaler Commonly known as: VENTOLIN HFA Inhale 2 puffs into the lungs every 6 (six) hours as needed  for wheezing or shortness of breath.   ibuprofen 600 MG tablet Commonly known as: ADVIL Take 1 tablet (600 mg total) by mouth every 6 (six) hours as needed.   insulin glargine 100 UNIT/ML injection Commonly known as: LANTUS Inject 0.15 mLs (15 Units total) into the skin daily.   NIFEdipine 30 MG 24 hr tablet Commonly known as: ADALAT CC TAKE 1 TABLET (30 MG TOTAL) BY MOUTH DAILY.   NIFEdipine 30 MG 24 hr tablet Commonly known as: Procardia XL Take 1 tablet (30 mg total) by mouth daily.   NovoLOG 100 UNIT/ML injection Generic drug: insulin aspart Inject 38 Units into the skin 3 (three) times daily before meals.   Prenatal 28-0.8 MG Tabs Take 1 tablet by mouth daily.   sertraline 50 MG tablet Commonly known as: Zoloft Take 1 tablet (50 mg total) by mouth daily.   Vitamin D (Ergocalciferol) 1.25 MG (50000 UNIT) Caps capsule Commonly known as: DRISDOL Take 1 capsule (50,000 Units total) by mouth every 7 (seven) days.        Review of Systems  Constitutional:  Negative for appetite change, chills, fatigue, fever and unexpected weight change.  HENT:  Negative for congestion, dental problem, ear discharge, ear pain, facial swelling, hearing loss, nosebleeds, postnasal drip, rhinorrhea, sinus pressure, sinus pain, sneezing, sore throat, tinnitus and trouble swallowing.   Eyes:  Negative for pain, discharge, redness, itching and visual disturbance.  Respiratory:  Negative for cough, chest tightness, shortness of breath and wheezing.   Cardiovascular:  Negative for chest pain, palpitations and leg swelling.  Gastrointestinal:  Negative for abdominal distention, abdominal pain, blood in stool, constipation, diarrhea, nausea and vomiting.  Endocrine: Negative for cold intolerance, heat intolerance, polydipsia, polyphagia and polyuria.  Genitourinary:  Negative for difficulty urinating, dysuria, flank pain, frequency and urgency.  Musculoskeletal:  Negative for arthralgias, back  pain, gait problem, joint swelling, myalgias, neck pain and neck stiffness.  Skin:  Negative for color change, pallor, rash and wound.  Neurological:  Negative for dizziness, syncope, speech difficulty, weakness, light-headedness, numbness and headaches.  Hematological:  Does not bruise/bleed easily.  Psychiatric/Behavioral:  Negative for agitation, behavioral problems, confusion, hallucinations, self-injury, sleep disturbance and suicidal ideas. The patient is not nervous/anxious.     Immunization History  Administered Date(s) Administered   Influenza,inj,Quad PF,6+ Mos 08/28/2014, 08/07/2015, 05/21/2018, 07/29/2020   Influenza-Unspecified 04/28/2012, 05/21/2018   MMR 09/11/2020   Moderna Sars-Covid-2 Vaccination 04/30/2020, 09/13/2020   PPD Test 10/28/2013   Pneumococcal Polysaccharide-23 01/09/2016   Tdap 03/06/2015, 01/09/2016, 07/04/2020   Pertinent  Health Maintenance Due  Topic Date Due   OPHTHALMOLOGY EXAM  Never done   FOOT EXAM  01/11/2019   HEMOGLOBIN A1C  05/30/2021   INFLUENZA VACCINE  03/03/2022   PAP SMEAR-Modifier  11/22/2022      09/11/2020  8:00 PM 09/12/2020    7:45 AM 09/12/2020    9:00 PM 09/13/2020    9:00 AM 09/15/2022    8:37 AM  Fall Risk  Falls in the past year?     0  Was there an injury with Fall?     0  Fall Risk Category Calculator     0  (RETIRED) Patient Fall Risk Level Low fall risk Moderate fall risk Low fall risk Moderate fall risk   Patient at Risk for Falls Due to     No Fall Risks  Fall risk Follow up     Falls evaluation completed   Functional Status Survey:    Vitals:   09/15/22 0919  BP: 128/84  Pulse: 86  Resp: 18  Temp: (!) 97.3 F (36.3 C)  SpO2: 100%  Weight: 242 lb (109.8 kg)  Height: 5' 8"$  (1.727 m)   Body mass index is 36.8 kg/m. Physical Exam Vitals reviewed.  Constitutional:      General: She is not in acute distress.    Appearance: Normal appearance. She is obese. She is not ill-appearing or diaphoretic.   HENT:     Head: Normocephalic.     Right Ear: Tympanic membrane, ear canal and external ear normal. There is no impacted cerumen.     Left Ear: Tympanic membrane, ear canal and external ear normal. There is no impacted cerumen.     Nose: Nose normal. No congestion or rhinorrhea.     Mouth/Throat:     Mouth: Mucous membranes are moist.     Pharynx: Oropharynx is clear. No oropharyngeal exudate or posterior oropharyngeal erythema.  Eyes:     General: No scleral icterus.       Right eye: No discharge.        Left eye: No discharge.     Extraocular Movements: Extraocular movements intact.     Conjunctiva/sclera: Conjunctivae normal.     Pupils: Pupils are equal, round, and reactive to light.  Neck:     Vascular: No carotid bruit.  Cardiovascular:     Rate and Rhythm: Normal rate and regular rhythm.     Pulses: Normal pulses.     Heart sounds: Normal heart sounds. No murmur heard.    No friction rub. No gallop.  Pulmonary:     Effort: Pulmonary effort is normal. No respiratory distress.     Breath sounds: Normal breath sounds. No wheezing, rhonchi or rales.  Chest:     Chest wall: No tenderness.  Abdominal:     General: Bowel sounds are normal. There is no distension.     Palpations: Abdomen is soft. There is no mass.     Tenderness: There is no abdominal tenderness. There is no right CVA tenderness, left CVA tenderness, guarding or rebound.  Musculoskeletal:        General: No swelling or tenderness. Normal range of motion.     Cervical back: Normal range of motion. No rigidity or tenderness.     Right lower leg: No edema.     Left lower leg: No edema.  Lymphadenopathy:     Cervical: No cervical adenopathy.  Skin:    General: Skin is warm and dry.     Coloration: Skin is not pale.     Findings: No bruising, erythema, lesion or rash.  Neurological:     Mental Status: She is alert and oriented to person, place, and time.     Cranial Nerves: No cranial nerve deficit.  Sensory: No sensory deficit.     Motor: No weakness.     Coordination: Coordination normal.     Gait: Gait normal.  Psychiatric:        Mood and Affect: Mood normal.        Speech: Speech normal.        Behavior: Behavior normal.        Thought Content: Thought content normal.        Judgment: Judgment normal.     Labs reviewed: Recent Labs    08/20/22 1632 08/30/22 0802  NA 132* 134*  K 3.9 4.2  CL 99 100  CO2 22 24  GLUCOSE 471* 304*  BUN 6 <5*  CREATININE 0.74 0.69  CALCIUM 8.7* 8.6*   Recent Labs    08/20/22 1632  AST 13*  ALT 13  ALKPHOS 91  BILITOT <0.1*  PROT 6.9  ALBUMIN 3.6   Recent Labs    08/20/22 1632 08/30/22 0802  WBC 5.1 4.1  NEUTROABS 2.7 2.0  HGB 8.6* 8.6*  HCT 27.8* 27.8*  MCV 66.5* 65.6*  PLT 323 332   Lab Results  Component Value Date   TSH 1.350 11/28/2020   Lab Results  Component Value Date   HGBA1C 9.3 (H) 11/28/2020   Lab Results  Component Value Date   CHOL 212 (H) 12/05/2020   HDL 46 12/05/2020   LDLCALC 138 (H) 12/05/2020   TRIG 154 (H) 12/05/2020   CHOLHDL 4.6 (H) 12/05/2020    Significant Diagnostic Results in last 30 days:  US Pelvis Complete  Result Date: 08/30/2022 CLINICAL DATA:  Vaginal bleeding. EXAM: TRANSABDOMINAL AND TRANSVAGINAL ULTRASOUND OF PELVIS TECHNIQUE: Both transabdominal and transvaginal ultrasound examinations of the pelvis were performed. Transabdominal technique was performed for global imaging of the pelvis including uterus, ovaries, adnexal regions, and pelvic cul-de-sac. It was necessary to proceed with endovaginal exam following the transabdominal exam to visualize the ovaries and better visualize the uterus and endometrium. COMPARISON:  06/22/2014 FINDINGS: Uterus Measurements: 11.5 x 6.7 x 6.2 cm = volume: 249 mL. Diffusely heterogeneous. At least 3 defined oval masses measuring up to 2.2 cm in maximum diameter each. These are within the myometrium with no visible submucosal component. There  is also a predominantly oval, heterogeneous, mass-like area within the cervix including the cervical canal measuring 5.6 x 3.7 x 2.8 cm with internal blood flow with color Doppler. Endometrium Thickness: 6.9 mm.  No focal abnormality visualized. Right ovary Measurements: 3.9 x 3.8 x 1.5 cm = volume: 11 mL. Normal appearance/no adnexal mass. Left ovary Measurements: 3.1 x 2.8 x 1.8 cm = volume: 8 mL. Normal appearance/no adnexal mass. Other findings No abnormal free fluid. IMPRESSION: 1. 5.6 x 3.7 x 2.8 cm heterogeneous mass in the cervix and cervical canal extending superiorly into the inferior aspect of the lower uterine segment. This is nonspecific with differential considerations including malignancy and an unusually low positioned fibroid. An elective pre and postcontrast MRI of the pelvis would provide additional information if clinically indicated. 2. Diffusely heterogeneous uterus containing multiple small fibroids. 3. Normal-appearing ovaries. Electronically Signed   By: Claudie Revering M.D.   On: 08/30/2022 14:17   US Transvaginal Non-OB  Result Date: 08/30/2022 CLINICAL DATA:  Vaginal bleeding. EXAM: TRANSABDOMINAL AND TRANSVAGINAL ULTRASOUND OF PELVIS TECHNIQUE: Both transabdominal and transvaginal ultrasound examinations of the pelvis were performed. Transabdominal technique was performed for global imaging of the pelvis including uterus, ovaries, adnexal regions, and pelvic cul-de-sac. It was necessary to proceed with endovaginal  exam following the transabdominal exam to visualize the ovaries and better visualize the uterus and endometrium. COMPARISON:  06/22/2014 FINDINGS: Uterus Measurements: 11.5 x 6.7 x 6.2 cm = volume: 249 mL. Diffusely heterogeneous. At least 3 defined oval masses measuring up to 2.2 cm in maximum diameter each. These are within the myometrium with no visible submucosal component. There is also a predominantly oval, heterogeneous, mass-like area within the cervix including the  cervical canal measuring 5.6 x 3.7 x 2.8 cm with internal blood flow with color Doppler. Endometrium Thickness: 6.9 mm.  No focal abnormality visualized. Right ovary Measurements: 3.9 x 3.8 x 1.5 cm = volume: 11 mL. Normal appearance/no adnexal mass. Left ovary Measurements: 3.1 x 2.8 x 1.8 cm = volume: 8 mL. Normal appearance/no adnexal mass. Other findings No abnormal free fluid. IMPRESSION: 1. 5.6 x 3.7 x 2.8 cm heterogeneous mass in the cervix and cervical canal extending superiorly into the inferior aspect of the lower uterine segment. This is nonspecific with differential considerations including malignancy and an unusually low positioned fibroid. An elective pre and postcontrast MRI of the pelvis would provide additional information if clinically indicated. 2. Diffusely heterogeneous uterus containing multiple small fibroids. 3. Normal-appearing ovaries. Electronically Signed   By: Claudie Revering M.D.   On: 08/30/2022 14:17   DG Chest 2 View  Result Date: 08/20/2022 CLINICAL DATA:  Chest pain EXAM: CHEST - 2 VIEW COMPARISON:  Chest x-ray August 27, 2018 FINDINGS: The cardiomediastinal silhouette is unchanged in contour. No focal pulmonary opacity. No pleural effusion or pneumothorax. The visualized upper abdomen is unremarkable. No acute osseous abnormality. IMPRESSION: No acute cardiopulmonary abnormality. Electronically Signed   By: Beryle Flock M.D.   On: 08/20/2022 17:28    Assessment/Plan 1. Encounter to establish care Available records reviewed ,recommend scheduling for fasting labs.Has had Influenza vaccine prior to moving from [place] but no records for review.   2. Essential hypertension B/p well controlled.currently off her B/p meds Dietary modification advised.current limited with exercise due to being Homeless.   3. Mild intermittent asthma without complication Recent flare up due to no [place].will refill medication today.   4. Type 2 diabetes mellitus without complication,  without long-term current use of insulin ([facility]) Uncontrolled CBG in the 300's -400's due to limited insulin supply since she had no PCP .will refill insulin then will monitor CBG at home and record on log provided then follow up in 2 weeks for evaluation.   5. Moderate episode of recurrent major depressive disorder ([place]) Has had increased stress level due to homeless Social worker to assist with shelter or living situation.   6. Generalized anxiety disorder Continue on sertraline   7. Vitamin D deficiency Continue on vit D supplement.   Family/ staff Communication: Reviewed plan of care with patient  Labs/tests ordered:  - CBC with Differential/Platelet - CMP with eGFR([facility]) - TSH - Hgb A1C - Lipid panel  Next Appointment : Return in about 2 weeks (around 09/29/2022) for fasting labs in one week or sooner.2 weeks for blood sugar check. Sandrea Hughs, NP

## 2022-09-16 LAB — MICROALBUMIN / CREATININE URINE RATIO
Creatinine, Urine: 29 mg/dL (ref 20–275)
Microalb Creat Ratio: 55 mcg/mg creat — ABNORMAL HIGH (ref <30)
Microalb, Ur: 1.6 mg/dL

## 2022-09-21 ENCOUNTER — Ambulatory Visit (HOSPITAL_COMMUNITY): Payer: Medicaid Other | Admitting: Clinical

## 2022-09-24 ENCOUNTER — Encounter: Payer: Self-pay | Admitting: Podiatry

## 2022-09-24 ENCOUNTER — Ambulatory Visit (INDEPENDENT_AMBULATORY_CARE_PROVIDER_SITE_OTHER): Payer: Commercial Managed Care - HMO | Admitting: Podiatry

## 2022-09-24 ENCOUNTER — Inpatient Hospital Stay (INDEPENDENT_AMBULATORY_CARE_PROVIDER_SITE_OTHER): Payer: Medicaid Other | Admitting: Primary Care

## 2022-09-24 VITALS — BP 125/79 | HR 89

## 2022-09-24 DIAGNOSIS — Z794 Long term (current) use of insulin: Secondary | ICD-10-CM

## 2022-09-24 DIAGNOSIS — E119 Type 2 diabetes mellitus without complications: Secondary | ICD-10-CM

## 2022-09-24 NOTE — Progress Notes (Signed)
  Subjective:  Patient ID: [name], female    DOB: 04-Aug-1985,  MRN: [id number]  Chief Complaint  Patient presents with   Diabetes    Diabetic foot exam - no complaints currently, last A1c was 9.3, does occasionally have plantar fascial flares, but feels fine today   New Patient (Initial Visit)    37 y.o. female presents with the above complaint. History confirmed with patient. Patient presenting for routine diabetic foot exam.  No complaints currently previously had trouble with an ingrown nail on the left hallux.  But it is not bothering her at all at this time.  No drainage redness or swelling.  She does have a history of type 2 diabetes with and currently on insulin.  Has been dealing with unstable housing situation recently and is working through that which is caused her not to be able to have more strict adherence to medical and diet management of diabetes.  Objective:  Physical Exam: warm, good capillary refill nail exam normal nails without lesions DP pulses palpable, PT pulses palpable, and protective sensation intact Left Foot: No open wounds or hyperkeratotic lesions present nails of normal length and appearance.  No pain on palpation of the left hallux nail. Right Foot: No open wounds or hyperkeratotic lesions present nails of normal length and appearance.    Assessment:   1. Encounter for diabetic foot exam ([place])   2. Type 2 diabetes mellitus without complication, with long-term current use of insulin ([place])      Plan:  Patient was evaluated and treated and all questions answered.  # DM2 without significant peripheral arterial disease or neuropathy.  Patient on insulin for hyperglycemia Patient educated on diabetes. Discussed proper diabetic foot care and discussed risks and complications of disease. Educated patient in depth on reasons to return to the office immediately should he/she discover anything concerning or new on the feet. All questions answered.  Discussed proper shoes as well.  -Discussed routine diabetic foot exams to monitor for any problems and if she has any concerns with the left hallux nail she is to call to get in for an ingrown nail removal procedure however she wishes to defer that at this time.   Return in about 6 months (around 03/25/2023) for Routine diabetic foot exam.         Everitt Amber, DPM [facility]

## 2022-09-25 ENCOUNTER — Other Ambulatory Visit: Payer: Commercial Managed Care - HMO

## 2022-09-25 DIAGNOSIS — I10 Essential (primary) hypertension: Secondary | ICD-10-CM

## 2022-09-25 DIAGNOSIS — E785 Hyperlipidemia, unspecified: Secondary | ICD-10-CM

## 2022-09-25 DIAGNOSIS — E119 Type 2 diabetes mellitus without complications: Secondary | ICD-10-CM

## 2022-09-28 LAB — COMPLETE METABOLIC PANEL WITH GFR
AG Ratio: 1.5 (calc) (ref 1.0–2.5)
ALT: 10 U/L (ref 6–29)
AST: 14 U/L (ref 10–30)
Albumin: 4.2 g/dL (ref 3.6–5.1)
Alkaline phosphatase (APISO): 97 U/L (ref 31–125)
BUN: 8 mg/dL (ref 7–25)
CO2: 22 mmol/L (ref 20–32)
Calcium: 8.9 mg/dL (ref 8.6–10.2)
Chloride: 101 mmol/L (ref 98–110)
Creat: 0.71 mg/dL (ref 0.50–0.97)
Globulin: 2.8 g/dL (calc) (ref 1.9–3.7)
Glucose, Bld: 323 mg/dL — ABNORMAL HIGH (ref 65–99)
Potassium: 4.2 mmol/L (ref 3.5–5.3)
Sodium: 133 mmol/L — ABNORMAL LOW (ref 135–146)
Total Bilirubin: 0.4 mg/dL (ref 0.2–1.2)
Total Protein: 7 g/dL (ref 6.1–8.1)
eGFR: 114 mL/min/{1.73_m2} (ref >=60)

## 2022-09-28 LAB — CBC WITH DIFFERENTIAL/PLATELET
Absolute Monocytes: 308 cells/uL (ref 200–950)
Basophils Absolute: 41 cells/uL (ref 0–200)
Basophils Relative: 0.9 %
Eosinophils Absolute: 60 cells/uL (ref 15–500)
Eosinophils Relative: 1.3 %
HCT: 24.1 % — ABNORMAL LOW (ref 35.0–45.0)
Hemoglobin: 6.9 g/dL — ABNORMAL LOW (ref 11.7–15.5)
Lymphs Abs: 2190 cells/uL (ref 850–3900)
MCH: 18 pg — ABNORMAL LOW (ref 27.0–33.0)
MCHC: 28.6 g/dL — ABNORMAL LOW (ref 32.0–36.0)
MCV: 62.9 fL — ABNORMAL LOW (ref 80.0–100.0)
MPV: 9.2 fL (ref 7.5–12.5)
Monocytes Relative: 6.7 %
Neutro Abs: 2001 cells/uL (ref 1500–7800)
Neutrophils Relative %: 43.5 %
Platelets: 283 10*3/uL (ref 140–400)
RBC: 3.83 10*6/uL (ref 3.80–5.10)
RDW: 16.8 % — ABNORMAL HIGH (ref 11.0–15.0)
Total Lymphocyte: 47.6 %
WBC: 4.6 10*3/uL (ref 3.8–10.8)

## 2022-09-28 LAB — LIPID PANEL
Cholesterol: 185 mg/dL (ref <200)
HDL: 53 mg/dL (ref >=50)
LDL Cholesterol (Calc): 114 mg/dL (calc) — ABNORMAL HIGH
Non-HDL Cholesterol (Calc): 132 mg/dL (calc) — ABNORMAL HIGH (ref <130)
Total CHOL/HDL Ratio: 3.5 (calc) (ref <5.0)
Triglycerides: 82 mg/dL (ref <150)

## 2022-09-28 LAB — HEMOGLOBIN A1C: Hgb A1c MFr Bld: >14 % of total Hgb — ABNORMAL HIGH (ref <5.7)

## 2022-09-28 LAB — TSH: TSH: 1.13 mIU/L

## 2022-10-12 ENCOUNTER — Other Ambulatory Visit: Payer: Self-pay

## 2022-10-12 ENCOUNTER — Emergency Department (HOSPITAL_COMMUNITY)
Admission: EM | Admit: 2022-10-12 | Discharge: 2022-10-12 | Disposition: A | Discharge status: Home / Self Care | Payer: Commercial Managed Care - HMO | Attending: Emergency Medicine | Admitting: Emergency Medicine

## 2022-10-12 ENCOUNTER — Encounter (HOSPITAL_COMMUNITY): Payer: Self-pay | Admitting: Emergency Medicine

## 2022-10-12 DIAGNOSIS — E119 Type 2 diabetes mellitus without complications: Secondary | ICD-10-CM | POA: Insufficient documentation

## 2022-10-12 DIAGNOSIS — J45909 Unspecified asthma, uncomplicated: Secondary | ICD-10-CM | POA: Insufficient documentation

## 2022-10-12 DIAGNOSIS — I10 Essential (primary) hypertension: Secondary | ICD-10-CM | POA: Diagnosis not present

## 2022-10-12 DIAGNOSIS — D649 Anemia, unspecified: Secondary | ICD-10-CM | POA: Diagnosis present

## 2022-10-12 LAB — BASIC METABOLIC PANEL
Anion gap: 10 (ref 5–15)
BUN: 5 mg/dL — ABNORMAL LOW (ref 6–20)
CO2: 23 mmol/L (ref 22–32)
Calcium: 8.9 mg/dL (ref 8.9–10.3)
Chloride: 100 mmol/L (ref 98–111)
Creatinine, Ser: 0.75 mg/dL (ref 0.44–1.00)
GFR, Estimated: >60 mL/min (ref >60)
Glucose, Bld: 372 mg/dL — ABNORMAL HIGH (ref 70–99)
Potassium: 3.3 mmol/L — ABNORMAL LOW (ref 3.5–5.1)
Sodium: 133 mmol/L — ABNORMAL LOW (ref 135–145)

## 2022-10-12 LAB — I-STAT BETA HCG BLOOD, ED (MC, WL, AP ONLY): I-stat hCG, quantitative: <5 m[IU]/mL (ref <5)

## 2022-10-12 LAB — CBC WITH DIFFERENTIAL/PLATELET
Abs Immature Granulocytes: 0.02 10*3/uL (ref 0.00–0.07)
Basophils Absolute: 0 10*3/uL (ref 0.0–0.1)
Basophils Relative: 1 %
Eosinophils Absolute: 0 10*3/uL (ref 0.0–0.5)
Eosinophils Relative: 1 %
HCT: 23.9 % — ABNORMAL LOW (ref 36.0–46.0)
Hemoglobin: 7 g/dL — ABNORMAL LOW (ref 12.0–15.0)
Immature Granulocytes: 0 %
Lymphocytes Relative: 34 %
Lymphs Abs: 2 10*3/uL (ref 0.7–4.0)
MCH: 17.8 pg — ABNORMAL LOW (ref 26.0–34.0)
MCHC: 29.3 g/dL — ABNORMAL LOW (ref 30.0–36.0)
MCV: 60.8 fL — ABNORMAL LOW (ref 80.0–100.0)
Monocytes Absolute: 0.3 10*3/uL (ref 0.1–1.0)
Monocytes Relative: 6 %
Neutro Abs: 3.4 10*3/uL (ref 1.7–7.7)
Neutrophils Relative %: 58 %
Platelets: 329 10*3/uL (ref 150–400)
RBC: 3.93 MIL/uL (ref 3.87–5.11)
RDW: 17.1 % — ABNORMAL HIGH (ref 11.5–15.5)
WBC: 5.8 10*3/uL (ref 4.0–10.5)
nRBC: 0 % (ref 0.0–0.2)

## 2022-10-12 LAB — SAMPLE TO BLOOD BANK

## 2022-10-12 MED ORDER — FREESTYLE LIBRE 3 SENSOR MISC
1.0000 | 0 refills | Status: DC
  Filled 2022-10-12: qty 2, 28d supply, fill #0
  Filled 2022-11-30 – 2023-01-15 (×3): qty 2, 28d supply, fill #1
  Filled 2023-02-07 – 2023-02-17 (×2): qty 2, 28d supply, fill #2
  Filled 2023-03-18: qty 2, 28d supply, fill #3

## 2022-10-12 NOTE — Telephone Encounter (Signed)
From: Anner Crete To: Office of Sandrea Hughs, NP Sent: 10/09/2022 10:15 PM EST Subject: Medication Renewal Request  Refills have been requested for the following medications:   Continuous Blood Gluc Sensor (FREESTYLE LIBRE 3 SENSOR) MISC  Patient Comment: Need ASAP  Preferred pharmacy: [facility] [address] Delivery method: Pickup Preferred pick-up date and time: 10/14/2022 3:00 PM

## 2022-10-12 NOTE — ED Triage Notes (Signed)
Pt here from home , pts pcp called her and told her hgb was low and come to the ED , blood was drawn in Feb

## 2022-10-12 NOTE — ED Provider Triage Note (Signed)
Emergency Medicine Provider Triage Evaluation Note  [name] , a 37 y.o. female  was evaluated in triage.  Pt complains of fatigue, hx of anemia, called by PCP for hgb 6.9 from 09/25/22. Reports vaginal bleeding since December, had fibroid removed in February and is bleeding less. Scheduled for GYN follow up on 10/21/22.  Review of Systems  Positive:  Negative:   Physical Exam  BP (!) 142/88   Pulse 96   Temp 98.6 F (37 C)   Resp 16   SpO2 100%  Gen:   Awake, no distress   Resp:  Normal effort  MSK:   Moves extremities without difficulty  Other:    Medical Decision Making  Medically screening exam initiated at 12:09 PM.  Appropriate orders placed.  [name] was informed that the remainder of the evaluation will be completed by another provider, this initial triage assessment does not replace that evaluation, and the importance of remaining in the ED until their evaluation is complete.     Tacy Learn, PA-C 10/12/22 1209

## 2022-10-12 NOTE — ED Provider Notes (Signed)
[facility] Provider Note   CSN: [id number] Arrival date & time: 10/12/22  1141     History  Chief Complaint  Patient presents with   Abnormal Lab    [name] is a 37 y.o. female with past medical history of T2DM, asthma, HTN presenting as referral by her PCP for low hemoglobin.  Patient had blood drawn February 23 and hemoglobin was 6.9.  Feeling generally fatigued past month with intermittent dyspnea on exertion as well as chest pain.  She has not had any syncopal events, but does feel faint when she is doing activities even such as walking.  She states that she has history of uterine fibroids that were removed approximately 1 month ago, but prior to that she had persistent heavy vaginal bleeding for the months of December and January.  Since the procedure, vaginal bleeding has diminished to spotting. Patient states that she has required iron infusions in the past when associated with anemia of pregnancy.  She has previously required daily iron pills as well. Denies blood in stool or urine, cough, abdominal pain.   Abnormal Lab      Home Medications Prior to Admission medications   Medication Sig Start Date End Date Taking? Authorizing Provider  albuterol (VENTOLIN HFA) 108 (90 Base) MCG/ACT inhaler Inhale 2 puffs into the lungs every 6 (six) hours as needed for wheezing or shortness of breath. 09/15/22   Ngetich, Dinah C, NP  ascorbic acid (VITAMIN C) 500 MG tablet Take 500 mg by mouth 2 (two) times daily.    [provider]  atorvastatin (LIPITOR) 20 MG tablet Take 1 tablet (20 mg total) by mouth daily. 09/15/22   Ngetich, Dinah C, NP  Continuous Blood Gluc Receiver (FREESTYLE LIBRE 3 READER) DEVI 1 Device by Does not apply route in the morning, at noon, in the evening, and at bedtime. 09/15/22   Ngetich, Dinah C, NP  Continuous Blood Gluc Sensor (FREESTYLE LIBRE 3 SENSOR) MISC Use as directed every 14 days 10/12/22    Ngetich, Dinah C, NP  cyclobenzaprine (FLEXERIL) 10 MG tablet Take 1 tablet (10 mg total) by mouth as needed for muscle spasms. 09/15/22   Ngetich, Dinah C, NP  ferrous sulfate 325 (65 FE) MG EC tablet Take 1 tablet (325 mg total) by mouth 2 (two) times daily with a meal. 09/15/22   Ngetich, Dinah C, NP  Insulin Glargine (BASAGLAR KWIKPEN) 100 UNIT/ML Inject 24 Units into the skin at bedtime. 09/15/22 12/14/22  Ngetich, Dinah C, NP  Prenatal 28-0.8 MG TABS Take 1 tablet by mouth daily. 09/09/22   Woodroe Mode, MD  sertraline (ZOLOFT) 50 MG tablet Take 1 tablet (50 mg total) by mouth daily. 09/15/22 09/15/23  Ngetich, Dinah C, NP  Vitamin D, Ergocalciferol, (DRISDOL) 1.25 MG (50000 UNIT) CAPS capsule Take 1 capsule (50,000 Units total) by mouth every 7 (seven) days. 09/15/22   Ngetich, Dinah C, NP  metFORMIN (GLUCOPHAGE) 1000 MG tablet Take 1 tablet (1,000 mg total) by mouth 2 (two) times daily with a meal. 09/13/20 10/14/20  Donnamae Jude, MD      Allergies    Patient has no known allergies.    Review of Systems   See HPI above  Physical Exam Updated Vital Signs BP 131/82 (BP Location: Right Arm)   Pulse 90   Temp 98.4 F (36.9 C) (Oral)   Resp 18   SpO2 100%  Physical Exam Vitals and nursing note reviewed.  Constitutional:      General: She is not in acute distress.    Appearance: Normal appearance. She is well-developed.  HENT:     Head: Normocephalic and atraumatic.     Nose: Nose normal.     Mouth/Throat:     Mouth: Mucous membranes are moist.     Pharynx: Oropharynx is clear.  Eyes:     Extraocular Movements: Extraocular movements intact.     Conjunctiva/sclera: Conjunctivae normal.     Pupils: Pupils are equal, round, and reactive to light.  Cardiovascular:     Rate and Rhythm: Normal rate and regular rhythm.     Pulses: Normal pulses.     Heart sounds: Normal heart sounds. No murmur heard. Pulmonary:     Effort: Pulmonary effort is normal. No respiratory distress.      Breath sounds: Normal breath sounds.  Abdominal:     General: Abdomen is flat. There is no distension.     Palpations: Abdomen is soft.     Tenderness: There is no abdominal tenderness.  Musculoskeletal:        General: No swelling.     Cervical back: Neck supple.  Skin:    General: Skin is warm and dry.     Capillary Refill: Capillary refill takes less than 2 seconds.  Neurological:     Mental Status: She is alert and oriented to person, place, and time.     Cranial Nerves: No cranial nerve deficit.     Sensory: No sensory deficit.     Motor: No weakness.     Gait: Gait normal.  Psychiatric:        Mood and Affect: Mood normal.     ED Results / Procedures / Treatments   Labs (all labs ordered are listed, but only abnormal results are displayed) Labs Reviewed  CBC WITH DIFFERENTIAL/PLATELET - Abnormal; Notable for the following components:      Result Value   Hemoglobin 7.0 (*)    HCT 23.9 (*)    MCV 60.8 (*)    MCH 17.8 (*)    MCHC 29.3 (*)    RDW 17.1 (*)    All other components within normal limits  BASIC METABOLIC PANEL - Abnormal; Notable for the following components:   Sodium 133 (*)    Potassium 3.3 (*)    Glucose, Bld 372 (*)    BUN 5 (*)    All other components within normal limits  I-STAT BETA HCG BLOOD, ED ([facility], [facility], [facility] ONLY)  SAMPLE TO BLOOD BANK    EKG No signs of acute ischemia, sinus rhythm.  Intervals appropriate and no peaked T waves or T wave inversions.  ED Course/ Medical Decision Making/ A&P   {                            Medical Decision Making Patient presents with hemoglobin of 6.9 noted by PCP exam 2 weeks ago.  She has been complaining of generalized fatigue as well as dyspnea exertion as well as intermittent chest pain.  Labs today show that hemoglobin is increased to 7.0.  She is hyperglycemic, has recently stopped taking Trulicity and just taking her insulin.  Labs are otherwise unremarkable acutely.  No acute signs of ischemia on  EKG.  Patient did recently just have excessive blood loss related to uterine fibroids, which are taking care of and procedure 1 month ago.  Likely became anemic due to persistent vaginal  bleeding and due to baseline iron deficiency anemia is anemic today.  Patient was instructed to take iron daily supplements and follow-up with her PCP in a few weeks for repeat lab work.  Final Clinical Impression(s) / ED Diagnoses Final diagnoses:  Symptomatic anemia     Bradd Canary, MD 10/12/22 1743    Lacretia Leigh, MD 10/13/22 1655

## 2022-10-12 NOTE — Discharge Instructions (Addendum)
Your hemoglobin today is found to be 7.0, this is increased from when it was taken 2 weeks ago at 6.9.  Your EKG and other labs were overall okay other than your blood glucose is elevated.  You should discuss this with your PCP to better control your sugar.  You should start taking a daily iron supplement and schedule follow-up appointment with your family physician to have labs rechecked.

## 2022-10-12 NOTE — ED Provider Notes (Signed)
I saw and evaluated the patient, reviewed the resident's note and I agree with the findings and plan.   37 year old patient presents with weakness times several weeks.  Had blood work done 2 weeks ago which showed low hemoglobin.  She is otherwise at her baseline state of health.  Patient is living here in 7.  No indication for emergent transfusion.  Will place on iron therapy and she will follow with her doctor.   Lacretia Leigh, MD 10/12/22 [phone]

## 2022-10-13 ENCOUNTER — Ambulatory Visit (INDEPENDENT_AMBULATORY_CARE_PROVIDER_SITE_OTHER): Payer: Medicaid Other | Admitting: Physician Assistant

## 2022-10-13 ENCOUNTER — Encounter (HOSPITAL_COMMUNITY): Payer: Self-pay | Admitting: Physician Assistant

## 2022-10-13 ENCOUNTER — Telehealth: Payer: Self-pay

## 2022-10-13 VITALS — BP 131/87 | HR 80 | Temp 98.2°F | Ht 68.0 in | Wt 242.0 lb

## 2022-10-13 DIAGNOSIS — F331 Major depressive disorder, recurrent, moderate: Secondary | ICD-10-CM | POA: Diagnosis not present

## 2022-10-13 DIAGNOSIS — Z630 Problems in relationship with spouse or partner: Secondary | ICD-10-CM

## 2022-10-13 DIAGNOSIS — F419 Anxiety disorder, unspecified: Secondary | ICD-10-CM | POA: Diagnosis not present

## 2022-10-13 DIAGNOSIS — Z5900 Homelessness unspecified: Secondary | ICD-10-CM

## 2022-10-13 MED ORDER — SERTRALINE HCL 50 MG PO TABS: 50.0000 mg | ORAL_TABLET | Freq: Every day | ORAL | 2 refills | Status: DC

## 2022-10-13 NOTE — Progress Notes (Signed)
[facility] MD/PA/NP OP Progress Note  10/13/2022 4:24 PM [name]  MRN:  [id number]  Chief Complaint:  Chief Complaint  Patient presents with   Follow-up   Medication Refill   HPI:   [name]. [name] is a 37 year old, African-American female with a past psychiatric history significant for major depressive disorder and anxiety who presents to [facility] for follow-up and medication management.  Patient is currently being managed on the following medication: Sertraline 50 mg daily.  Patient presents to the encounter struggling with multiple stressors.  Patient's main stressor involves her current living situation stating that they are currently living from hotel to hotel.  She reports that she has a big family and it is difficult to find the best living situation due to her family size.  Patient states that she has started working at a new job, but the job does not pay as much.  She reports that she can barely afford a hotel with how much money she is being paid.  Patient notes that she also got into an argument on Sunday with her spouse.  She reports that she and her spouse have since talked about their argument from the other day and are doing better.  Patient states that she is currently in school but states that she may have to leave school due to applying for another job.  She reports that her partner has disability and they have been waiting for the partners disability checks to come in to help out with his family expenses.  Patient continues to endorse depression and rates her depression as 5 out of 10.  Patient reports that she experiences depression 5 days out of the week with symptoms lasting the whole day.  Patient endorses the following depressive symptoms: irritability, self disappointment, and exhaustion.  Patient also endorses anxiety that plays into her depression.  Patient rates her anxiety as 7 out of 10.  Patient denies the need for  dosage adjustments at this time and states that once she is stable in life, her depression will de-escalate.  A PHQ-9 screen was performed with the patient scoring of 15.  A GAD-7 screen was also performed with the patient scored a 19.  Visit Diagnosis:    ICD-10-CM   1. Homeless family  Z59.00     2. Moderate episode of recurrent major depressive disorder (HCC)  F33.1 sertraline (ZOLOFT) 50 MG tablet    3. Anxiety disorder, unspecified type  F41.9 sertraline (ZOLOFT) 50 MG tablet    4. Relationship problem between partners  Z63.0       Past Psychiatric History:  Patient endorses a past history depression and anxiety   Past Medical History:  Past Medical History:  Diagnosis Date   Asthma    COVID-19 07/04/2020   05/04/20   Diabetes mellitus without complication ([place])    DKA (diabetic ketoacidoses) 08/27/2018   Family history of diabetes mellitus in mother 01/10/2018   Fibroid    pedunculated posterior RUQ near fundus. approx 3-4cm at time of 01/2016 c-section   GBS (group B streptococcus) infection    Hearing loss    pt states she was tested in high school and told it was selective she says she continues to have a deficit and does not feel it is selective   Herpes    Herpes simplex vulvovaginitis 09/06/2020   Hypertension    Newly diagnosed diabetes ([place]) 01/10/2018   Diagnosed 01/10/18  A1c 7.5%   Sickle cell trait (  [place]) 09/01/2014   '[ ]'$  ucx q trimester   Trichomonas vaginitis     Past Surgical History:  Procedure Laterality Date   CESAREAN SECTION N/A 01/08/2016   Procedure: CESAREAN SECTION;  Surgeon: Aletha Halim, MD;  Location: [facility];  Service: Obstetrics;  Laterality: N/A;   CESAREAN SECTION N/A 09/10/2020   Procedure: CESAREAN SECTION;  Surgeon: Truett Mainland, DO;  Location: [facility];  Service: Obstetrics;  Laterality: N/A;   DILATION AND CURETTAGE OF UTERUS     TUBAL LIGATION  09/10/2020   Procedure: BILATERAL TUBAL LIGATION;  Surgeon: Truett Mainland, DO;   Location: [facility] [facility];  Service: Obstetrics;;   WISDOM TOOTH EXTRACTION      Family Psychiatric History:  Mother - patient reports that her mother states that she has "everything in the book" in regards to her mental health Sister - history of suicide and depression Brother (deceased) - depression   Family history of suicide attempt: Patient reports that her mother and sister have attempted suicide Family history of homicide attempt: Patient denies a family history of homicide attempts Family history of substance abuse: Patient reports that her mother abused drugs in the past  Family History:  Family History  Problem Relation Age of Onset   Diabetes Mother    Hypertension Mother    Hyperlipidemia Mother    Heart disease Mother    Breast cancer Mother        47   Ovarian cancer Mother        Unknown age   Diabetes Sister    Diabetes Brother    Breast cancer Cousin    Heart disease Maternal Grandmother     Social History:  Social History   Socioeconomic History   Marital status: Married    Spouse name: Not on file   Number of children: Not on file   Years of education: Not on file   Highest education level: Not on file  Occupational History   Not on file  Tobacco Use   Smoking status: Former    Types: Cigarettes   Smokeless tobacco: Never   Tobacco comments:    quit 2013  Vaping Use   Vaping Use: Never used  Substance and Sexual Activity   Alcohol use: No   Drug use: No   Sexual activity: Yes    Birth control/protection: None  Other Topics Concern   Not on file  Social History Narrative   Not on file   Social Determinants of Health   Financial Resource Strain: Low Risk  (01/10/2018)   Overall Financial Resource Strain (CARDIA)    Difficulty of Paying Living Expenses: Not very hard  Food Insecurity: Food Insecurity Present (09/09/2022)   Hunger Vital Sign    Worried About Running Out of Food in the Last Year: Often true    Ran Out of Food in the Last Year:  Often true  Transportation Needs: Unmet Transportation Needs (09/09/2022)   PRAPARE - Transportation    Lack of Transportation (Medical): Yes    Lack of Transportation (Non-Medical): Yes  Physical Activity: Insufficiently Active (01/10/2018)   Exercise Vital Sign    Days of Exercise per Week: 1 day    Minutes of Exercise per Session: 10 min  Stress: Stress Concern Present (01/10/2018)   [place]    Feeling of Stress : Very much  Social Connections: Moderately Isolated (01/10/2018)   Social Connection and Isolation Panel [NHANES]  Frequency of Communication with Friends and Family: Three times a week    Frequency of Social Gatherings with Friends and Family: Once a week    Attends Religious Services: Never    Marine scientist or Organizations: No    Attends Archivist Meetings: Never    Marital Status: Separated    Allergies: No Known Allergies  Metabolic Disorder Labs: Lab Results  Component Value Date   HGBA1C >14.0 (H) 09/25/2022   MPG  09/25/2022     Comment:     eAG cannot be calculated. Hemoglobin A1c result exceeds the linearity of the assay. . HbA1c performed on Roche platform. Effective 05/11/22 a change in test platforms may have  shifted HbA1c results compared to historical results.    MPG 162.81 09/10/2020   No results found for: "PROLACTIN" Lab Results  Component Value Date   CHOL 185 09/25/2022   TRIG 82 09/25/2022   HDL 53 09/25/2022   CHOLHDL 3.5 09/25/2022   LDLCALC 114 (H) 09/25/2022   LDLCALC 138 (H) 12/05/2020   Lab Results  Component Value Date   TSH 1.13 09/25/2022   TSH 1.350 11/28/2020    Therapeutic Level Labs: No results found for: "LITHIUM" No results found for: "VALPROATE" No results found for: "CBMZ"  Current Medications: Current Outpatient Medications  Medication Sig Dispense Refill   albuterol (VENTOLIN HFA) 108 (90 Base) MCG/ACT inhaler Inhale 2  puffs into the lungs every 6 (six) hours as needed for wheezing or shortness of breath. 1 each 1   ascorbic acid (VITAMIN C) 500 MG tablet Take 500 mg by mouth 2 (two) times daily.     atorvastatin (LIPITOR) 20 MG tablet Take 1 tablet (20 mg total) by mouth daily. 90 tablet 1   Continuous Blood Gluc Receiver (FREESTYLE LIBRE 3 READER) DEVI 1 Device by Does not apply route in the morning, at noon, in the evening, and at bedtime. 1 each 5   Continuous Blood Gluc Sensor (FREESTYLE LIBRE 3 SENSOR) MISC Use as directed every 14 days 8 each 0   cyclobenzaprine (FLEXERIL) 10 MG tablet Take 1 tablet (10 mg total) by mouth as needed for muscle spasms. 30 tablet 1   ferrous sulfate 325 (65 FE) MG EC tablet Take 1 tablet (325 mg total) by mouth 2 (two) times daily with a meal. 90 tablet 1   Insulin Glargine (BASAGLAR KWIKPEN) 100 UNIT/ML Inject 24 Units into the skin at bedtime. 7.2 mL 2   Prenatal 28-0.8 MG TABS Take 1 tablet by mouth daily. 30 tablet 12   sertraline (ZOLOFT) 50 MG tablet Take 1 tablet (50 mg total) by mouth daily. 30 tablet 2   Vitamin D, Ergocalciferol, (DRISDOL) 1.25 MG (50000 UNIT) CAPS capsule Take 1 capsule (50,000 Units total) by mouth every 7 (seven) days. 4 capsule 2   No current facility-administered medications for this visit.     Musculoskeletal: Strength & Muscle Tone: within normal limits Gait & Station: normal Patient leans: N/A  Psychiatric Specialty Exam: Review of Systems  Psychiatric/Behavioral:  Positive for sleep disturbance. Negative for decreased concentration, dysphoric mood, hallucinations, self-injury and suicidal ideas. The patient is nervous/anxious. The patient is not hyperactive.     Blood pressure 131/87, pulse 80, temperature 98.2 F (36.8 C), temperature source Oral, height '5\' 8"'$  (1.727 m), weight 242 lb (109.8 kg).Body mass index is 36.8 kg/m.  General Appearance: Casual  Eye Contact:  Good  Speech:  Clear and Coherent and Normal Rate  Volume:  Normal  Mood:  Anxious and Depressed  Affect:  Appropriate  Thought Process:  Coherent and Descriptions of Associations: Intact  Orientation:  Full (Time, Place, and Person)  Thought Content: WDL   Suicidal Thoughts:  No  Homicidal Thoughts:  No  Memory:  Immediate;   Good Recent;   Good Remote;   Good  Judgement:  Good  Insight:  Good  Psychomotor Activity:  Normal  Concentration:  Concentration: Good and Attention Span: Good  Recall:  Good  Fund of Knowledge: Good  Language: Good  Akathisia:  No  Handed:  Right  AIMS (if indicated): not done  Assets:  Communication Skills Desire for Improvement Social Support  ADL's:  Intact  Cognition: WNL  Sleep:  Fair   Screenings: GAD-7    Flowsheet Row Clinical Support from 10/13/2022 in [facility] Office Visit from 09/09/2022 in [facility] [place] [facility] Office Visit from 09/08/2022 in [facility] Counselor from 08/25/2022 in [facility] Office Visit from 11/28/2020 in [facility]  Total GAD-7 Score '19 20 20 20 5      '$ PHQ2-9    [place] from 10/13/2022 in [facility] Office Visit from 09/09/2022 in [facility] for [place] [facility] Office Visit from 09/08/2022 in [facility] Counselor from 08/25/2022 in [facility] Office Visit from 11/28/2020 in [place] [facility]  PHQ-2 Total Score '5 6 4 6 '$ 0  PHQ-9 Total Score '15 22 19 20 4      '$ Flowsheet Row Clinical Support from 10/13/2022 in [facility] ED from 10/12/2022 in [facility] Emergency Department at [facility] Office Visit from 09/08/2022 in [facility] No Risk No Risk No Risk        Assessment and Plan:   [name]. [name] is  a 37 year old, African-American female with a past psychiatric history significant for major depressive disorder and anxiety who presents to [facility] for follow-up and medication management.  Patient presents to the encounter with multiple stressors in her life that are contributing to her depression and anxiety.  Patient has been taking her medications consistently but denies the need for dosage adjustments at this time.  Patient reports that she will continue to take her medications as prescribed but states that once she gets more stable in life, her depression will de escalate and she will be in a much better place financially.  Patient's medication to be e-prescribed to pharmacy of choice.  Collaboration of Care: Collaboration of Care: Medication Management AEB provider managing patient's psychiatric medications, Psychiatrist AEB patient being seen by mental health provider at this facility, and Referral or follow-up with counselor/therapist AEB patient being seen by a licensed clinical social worker at this facility  Patient/Guardian was advised Release of Information must be obtained prior to any record release in order to collaborate their care with an outside provider. Patient/Guardian was advised if they have not already done so to contact the registration department to sign all necessary forms in order for [place] to release information regarding their care.   Consent: Patient/Guardian gives verbal consent for treatment and assignment of benefits for services provided during this visit. Patient/Guardian expressed understanding and agreed to proceed.   1. Moderate episode of recurrent major depressive disorder (  [place])  - sertraline (ZOLOFT) 50 MG tablet; Take 1 tablet (50 mg total) by mouth daily.  Dispense: 30 tablet; Refill: 2  2. Anxiety disorder, unspecified type  - sertraline (ZOLOFT) 50 MG tablet; Take 1 tablet (50 mg total) by mouth daily.  Dispense:  30 tablet; Refill: 2  3. Homeless family   4. Relationship problem between partners  Patient to follow-up in 2 months Provider spent a total of 23 minutes with the patient/reviewing patient's chart  Malachy Mood, PA 10/13/2022, 4:24 PM

## 2022-10-14 NOTE — Transitions of Care (Post Inpatient/ED Visit) (Deleted)
   10/14/2022  Name: [name] MRN: [id number] DOB: 11/01/1985  Today's TOC FU Call Status: Today's TOC FU Call Status:: Unsuccessul Call (1st Attempt) Unsuccessful Call (1st Attempt) Date: 10/13/22  Attempted to reach the patient regarding the most recent Inpatient/ED visit.  Follow Up Plan: No further outreach attempts will be made at this time. We have been unable to contact the patient.  Signature Issa Kosmicki.D/RMA

## 2022-10-14 NOTE — Transitions of Care (Post Inpatient/ED Visit) (Unsigned)
   10/14/2022  Name: [name] MRN: [id number] DOB: Jan 03, 1986  Today's TOC FU Call Status: Unsuccessful Call ( 2ndAttempt) Date: 10/14/2022  Attempted to reach the patient regarding the most recent Inpatient/ED visit.  Follow Up Plan: Additional outreach attempts will be made to reach the patient to complete the Transitions of Care (Post Inpatient/ED visit) call.   Signature Joua Bake.D/RMA

## 2022-10-14 NOTE — Transitions of Care (Post Inpatient/ED Visit) (Unsigned)
   10/14/2022  Name: [name] MRN: [id number] DOB: 03/14/1986  Today's TOC FU Call Status: Today's TOC FU Call Status:: Unsuccessul Call (1st Attempt) Unsuccessful Call (1st Attempt) Date: 10/13/22  Attempted to reach the patient regarding the most recent Inpatient/ED visit.  Follow Up Plan: Additional outreach attempts will be made to reach the patient to complete the Transitions of Care (Post Inpatient/ED visit) call.   Signature Graves Nipp.D/RMA

## 2022-10-15 ENCOUNTER — Telehealth (HOSPITAL_COMMUNITY): Payer: Self-pay | Admitting: Clinical

## 2022-10-15 ENCOUNTER — Other Ambulatory Visit: Payer: Self-pay

## 2022-10-15 NOTE — Transitions of Care (Post Inpatient/ED Visit) (Signed)
   10/15/2022  Name: SIMON AABERG MRN: [id number] DOB: 1986/06/01  Today's TOC FU Call Status: Unsuccessful Call (3rd Attempt) Date: 10/15/2022  Attempted to reach the patient regarding the most recent Inpatient/ED visit.  Follow Up Plan: No further outreach attempts will be made at this time. We have been unable to contact the patient.  Signature Alfhild Partch.D/RMA

## 2022-10-19 ENCOUNTER — Ambulatory Visit (HOSPITAL_COMMUNITY): Payer: Medicaid Other | Admitting: Clinical

## 2022-10-19 ENCOUNTER — Encounter (HOSPITAL_COMMUNITY): Payer: Self-pay

## 2022-10-21 ENCOUNTER — Encounter: Payer: Self-pay | Admitting: Obstetrics & Gynecology

## 2022-10-21 ENCOUNTER — Ambulatory Visit (INDEPENDENT_AMBULATORY_CARE_PROVIDER_SITE_OTHER): Payer: Commercial Managed Care - HMO | Admitting: Obstetrics & Gynecology

## 2022-10-21 ENCOUNTER — Other Ambulatory Visit: Payer: Self-pay

## 2022-10-21 VITALS — BP 122/75 | HR 99 | Ht 68.0 in | Wt 232.0 lb

## 2022-10-21 DIAGNOSIS — F419 Anxiety disorder, unspecified: Secondary | ICD-10-CM

## 2022-10-21 DIAGNOSIS — I10 Essential (primary) hypertension: Secondary | ICD-10-CM

## 2022-10-21 DIAGNOSIS — D219 Benign neoplasm of connective and other soft tissue, unspecified: Secondary | ICD-10-CM

## 2022-10-21 MED ORDER — MEDROXYPROGESTERONE ACETATE 5 MG PO TABS: 10.0000 mg | ORAL_TABLET | Freq: Every day | ORAL | 1 refills | Status: DC

## 2022-10-21 NOTE — Progress Notes (Signed)
Patient ID: [name], female   DOB: February 19, 1986, 37 y.o.   MRN: [id number]  Chief Complaint  Patient presents with   Menstrual Problem    6 week follow up.  Still bleeding    HPI Raven MENDELL is a 37 y.o. female.  [id number] She had a fibroid removed in the office and still has daily light bleeding, no pain. HPI  Past Medical History:  Diagnosis Date   Asthma    COVID-19 07/04/2020   05/04/20   Diabetes mellitus without complication ([place])    DKA (diabetic ketoacidoses) 08/27/2018   Family history of diabetes mellitus in mother 01/10/2018   Fibroid    pedunculated posterior RUQ near fundus. approx 3-4cm at time of 01/2016 c-section   GBS (group B streptococcus) infection    Hearing loss    pt states she was tested in high school and told it was selective she says she continues to have a deficit and does not feel it is selective   Herpes    Herpes simplex vulvovaginitis 09/06/2020   Hypertension    Newly diagnosed diabetes ([place]) 01/10/2018   Diagnosed 01/10/18  A1c 7.5%   Sickle cell trait ([place]) 09/01/2014   [ ]  ucx q trimester   Trichomonas vaginitis     Past Surgical History:  Procedure Laterality Date   CESAREAN SECTION N/A 01/08/2016   Procedure: CESAREAN SECTION;  Surgeon: Aletha Halim, MD;  Location: [facility];  Service: Obstetrics;  Laterality: N/A;   CESAREAN SECTION N/A 09/10/2020   Procedure: CESAREAN SECTION;  Surgeon: Truett Mainland, DO;  Location: [facility] LD ORS;  Service: Obstetrics;  Laterality: N/A;   DILATION AND CURETTAGE OF UTERUS     TUBAL LIGATION  09/10/2020   Procedure: BILATERAL TUBAL LIGATION;  Surgeon: Truett Mainland, DO;  Location: [facility];  Service: Obstetrics;;   WISDOM TOOTH EXTRACTION      Family History  Problem Relation Age of Onset   Diabetes Mother    Hypertension Mother    Hyperlipidemia Mother    Heart disease Mother    Breast cancer Mother        5   Ovarian cancer Mother        Unknown age   Diabetes Sister     Diabetes Brother    Breast cancer Cousin    Heart disease Maternal Grandmother     Social History Social History   Tobacco Use   Smoking status: Former    Types: Cigarettes   Smokeless tobacco: Never   Tobacco comments:    quit 2013  Vaping Use   Vaping Use: Never used  Substance Use Topics   Alcohol use: No   Drug use: No    No Known Allergies  Current Outpatient Medications  Medication Sig Dispense Refill   albuterol (VENTOLIN HFA) 108 (90 Base) MCG/ACT inhaler Inhale 2 puffs into the lungs every 6 (six) hours as needed for wheezing or shortness of breath. 1 each 1   ascorbic acid (VITAMIN C) 500 MG tablet Take 500 mg by mouth 2 (two) times daily.     atorvastatin (LIPITOR) 20 MG tablet Take 1 tablet (20 mg total) by mouth daily. 90 tablet 1   Continuous Blood Gluc Receiver (FREESTYLE LIBRE 3 READER) DEVI 1 Device by Does not apply route in the morning, at noon, in the evening, and at bedtime. 1 each 5   Continuous Blood Gluc Sensor (FREESTYLE LIBRE 3 SENSOR) MISC Use as directed every 14 days  8 each 0   cyclobenzaprine (FLEXERIL) 10 MG tablet Take 1 tablet (10 mg total) by mouth as needed for muscle spasms. 30 tablet 1   ferrous sulfate 325 (65 FE) MG EC tablet Take 1 tablet (325 mg total) by mouth 2 (two) times daily with a meal. 90 tablet 1   Insulin Glargine (BASAGLAR KWIKPEN) 100 UNIT/ML Inject 24 Units into the skin at bedtime. 7.2 mL 2   Prenatal 28-0.8 MG TABS Take 1 tablet by mouth daily. 30 tablet 12   sertraline (ZOLOFT) 50 MG tablet Take 1 tablet (50 mg total) by mouth daily. 30 tablet 2   Vitamin D, Ergocalciferol, (DRISDOL) 1.25 MG (50000 UNIT) CAPS capsule Take 1 capsule (50,000 Units total) by mouth every 7 (seven) days. 4 capsule 2   No current facility-administered medications for this visit.    Review of Systems Review of Systems  Constitutional: Negative.   Respiratory: Negative.    Cardiovascular: Negative.   Gastrointestinal: Negative.    Genitourinary:  Positive for vaginal bleeding.    Blood pressure 122/75, pulse 99, height 5\' 8"  (1.727 m), weight 232 lb (105.2 kg), last menstrual period 07/03/2022.  Physical Exam Physical Exam Vitals and nursing note reviewed.  Constitutional:      Appearance: She is obese. She is not ill-appearing.  Cardiovascular:     Rate and Rhythm: Normal rate.  Pulmonary:     Effort: Pulmonary effort is normal.  Neurological:     Mental Status: She is alert.  Psychiatric:        Mood and Affect: Mood normal.        Behavior: Behavior normal.     Data Reviewed SURGICAL PATHOLOGY CASE: [id number] PATIENT: [name] Surgical Pathology Report     Clinical History: Submucous uterine fibroid (ms)     FINAL MICROSCOPIC DIAGNOSIS:  A. CERVIX, PROLAPSED FIBROID, EXCISION: - Fragments of benign smooth muscle, consistent with clinically stated leiomyoma - No evidence of malignancy     GROSS DESCRIPTION:  The specimen is received in formalin, and consists of a 24 g, 4.6 x 3.8 x 2.9 cm nodular portion of tan fibrous tissue.  Sectioning reveals tan, whorled cut surfaces.  Representative sections are submitted in 1 cassette.  Craig Staggers 09/10/2022)    Final Diagnosis performed by Jaquita Folds, MD.   Electronically signed 09/11/2022 Technical component performed at [facility]. [facility], [place] [address], [place], [place] [address].  Professional component performed at [facility], [place] [address] [place], [place] [address].  Immunohistochemistry Technical component (if applicable) was performed at [facility]. [address] [place], [place], [place] [address].   IMMUNOHISTOCHEMISTRY DISCLAIMER (if applicable): Some of these immunohistochemical stains may have been developed and the performance characteristics determine by [facility]. Some may not have been cleared or approved by the U.S. Food and  Drug Administration. The FDA has determined that such clearance or approval is not necessary. This test is used for clinical purposes. It should not be regarded as investigational or for research. This laboratory is certified under the [place] (CLIA-88) as qualified to perform high complexity clinical laboratory testing.  The controls stained appropriately.      Assessment Anxiety - Plan: Ambulatory referral to [facility]  Essential hypertension  Fibroids - Plan: medroxyPROGESTERone (PROVERA) 5 MG tablet   Plan Meds ordered this encounter  Medications   medroxyPROGESTERone (PROVERA) 5 MG tablet    Sig: Take 2 tablets (10  mg total) by mouth daily.    Dispense:  60 tablet    Refill:  1   RTC 2 months. She may need f/U US and will consider LNGIUD or ablation if she still has problems or [place] if indicated    Emeterio Reeve 10/21/2022, 3:46 PM

## 2022-10-27 ENCOUNTER — Telehealth: Payer: Self-pay | Admitting: Clinical

## 2022-10-27 NOTE — Telephone Encounter (Signed)
Attempt call regarding referral; Left HIPPA-compliant message to call back Roselyn Reef from [employer] for [employer] at [facility] at  [phone] [facility] office); left MyChart message for pt.

## 2022-11-05 ENCOUNTER — Other Ambulatory Visit (HOSPITAL_COMMUNITY): Payer: Self-pay

## 2022-11-05 ENCOUNTER — Ambulatory Visit (INDEPENDENT_AMBULATORY_CARE_PROVIDER_SITE_OTHER): Payer: Commercial Managed Care - HMO | Admitting: Family

## 2022-11-05 ENCOUNTER — Encounter: Payer: Self-pay | Admitting: Family

## 2022-11-05 VITALS — BP 124/78 | HR 90 | Temp 97.6°F | Resp 16 | Ht 68.0 in | Wt 243.8 lb

## 2022-11-05 DIAGNOSIS — F331 Major depressive disorder, recurrent, moderate: Secondary | ICD-10-CM

## 2022-11-05 DIAGNOSIS — E785 Hyperlipidemia, unspecified: Secondary | ICD-10-CM

## 2022-11-05 DIAGNOSIS — E119 Type 2 diabetes mellitus without complications: Secondary | ICD-10-CM | POA: Diagnosis not present

## 2022-11-05 DIAGNOSIS — I10 Essential (primary) hypertension: Secondary | ICD-10-CM | POA: Diagnosis not present

## 2022-11-05 DIAGNOSIS — I83812 Varicose veins of left lower extremities with pain: Secondary | ICD-10-CM | POA: Diagnosis not present

## 2022-11-05 MED ORDER — BASAGLAR KWIKPEN 100 UNIT/ML ~~LOC~~ SOPN
24.0000 [IU] | PEN_INJECTOR | Freq: Every day | SUBCUTANEOUS | 3 refills | Status: DC
  Filled 2022-11-05: qty 9, 37d supply, fill #0
  Filled 2022-11-30: qty 6, 25d supply, fill #0
  Filled 2022-12-22: qty 9, 37d supply, fill #0

## 2022-11-05 NOTE — Progress Notes (Signed)
Provider: Marlowe Sax FNP-C  Ayla Dunigan, Nelda Bucks, NP  Patient Care Team: Kamron Portee, Nelda Bucks, NP as PCP - General (Family Medicine)  Extended Emergency Contact Information Primary Emergency Contact: [facility] Phone: [phone] Relation: Significant other Secondary Emergency Contact: [place] [employer] Phone: [phone] Relation: Friend Preferred language: English Interpreter needed? No  Code Status:  Full Code  Goals of care: Advanced Directive information    09/15/2022    8:36 AM  Advanced Directives  Does Patient Have a Medical Advance Directive? No  Would patient like information on creating a medical advance directive? No - Patient declined     Chief Complaint  Patient presents with   spider veins    In right leg pain x 2 week, she has had some warmth and feels like they are going down into her calf Left hand neuropathy-none in arm or shoulder. Has had that 2-3 weeks.     HPI:  Pt is a 37 y.o. female seen today for an acute visit for evaluation of left leg spider vein pain x 2 weeks.states her work requires standing. Veins tender when trying to pull up her pants.she denies any swelling or redness of the leg.  Also complains of numbness and tingling of left index and middle finger from neuropathy.states unable to take Gabapentin due to her work.  States will be relocating to an apartment since she did not have a place to stay.  She was referred to ENT in previous visit for hearing but states did not getting any calls from specialist office.On chart review,referral was sent to  [facility].Per referral Coordinator Jilda Roche Mychart message was sent to patient.Patient states prefers a phone call.  States blood sugars have been high but has started to change her eating since she got a new place to stay.Had a salad yesterday.   Past Medical History:  Diagnosis Date   Asthma    COVID-19 07/04/2020   05/04/20   Diabetes mellitus without  complication    DKA (diabetic ketoacidoses) 08/27/2018   Family history of diabetes mellitus in mother 01/10/2018   Fibroid    pedunculated posterior RUQ near fundus. approx 3-4cm at time of 01/2016 c-section   GBS (group B streptococcus) infection    Hearing loss    pt states she was tested in high school and told it was selective she says she continues to have a deficit and does not feel it is selective   Herpes    Herpes simplex vulvovaginitis 09/06/2020   Hypertension    Newly diagnosed diabetes 01/10/2018   Diagnosed 01/10/18  A1c 7.5%   Sickle cell trait 09/01/2014   [ ]  ucx q trimester   Trichomonas vaginitis    Past Surgical History:  Procedure Laterality Date   CESAREAN SECTION N/A 01/08/2016   Procedure: CESAREAN SECTION;  Surgeon: Aletha Halim, MD;  Location: [facility];  Service: Obstetrics;  Laterality: N/A;   CESAREAN SECTION N/A 09/10/2020   Procedure: CESAREAN SECTION;  Surgeon: Truett Mainland, DO;  Location: [place] LD ORS;  Service: Obstetrics;  Laterality: N/A;   DILATION AND CURETTAGE OF UTERUS     TUBAL LIGATION  09/10/2020   Procedure: BILATERAL TUBAL LIGATION;  Surgeon: Truett Mainland, DO;  Location: [facility]S;  Service: Obstetrics;;   WISDOM TOOTH EXTRACTION      No Known Allergies  Outpatient Encounter Medications as of 11/05/2022  Medication Sig   albuterol (VENTOLIN HFA) 108 (90 Base) MCG/ACT inhaler Inhale 2 puffs into the  lungs every 6 (six) hours as needed for wheezing or shortness of breath.   ascorbic acid (VITAMIN C) 500 MG tablet Take 500 mg by mouth 2 (two) times daily.   atorvastatin (LIPITOR) 20 MG tablet Take 1 tablet (20 mg total) by mouth daily.   Continuous Blood Gluc Receiver (FREESTYLE LIBRE 3 READER) DEVI 1 Device by Does not apply route in the morning, at noon, in the evening, and at bedtime.   Continuous Blood Gluc Sensor (FREESTYLE LIBRE 3 SENSOR) MISC Use as directed every 14 days   cyclobenzaprine (FLEXERIL) 10 MG tablet Take 1 tablet  (10 mg total) by mouth as needed for muscle spasms.   ferrous sulfate 325 (65 FE) MG EC tablet Take 1 tablet (325 mg total) by mouth 2 (two) times daily with a meal.   Insulin Glargine (BASAGLAR KWIKPEN) 100 UNIT/ML Inject 24 Units into the skin at bedtime.   medroxyPROGESTERone (PROVERA) 5 MG tablet Take 2 tablets (10 mg total) by mouth daily.   Prenatal 28-0.8 MG TABS Take 1 tablet by mouth daily.   sertraline (ZOLOFT) 50 MG tablet Take 1 tablet (50 mg total) by mouth daily.   Vitamin D, Ergocalciferol, (DRISDOL) 1.25 MG (50000 UNIT) CAPS capsule Take 1 capsule (50,000 Units total) by mouth every 7 (seven) days.   [DISCONTINUED] metFORMIN (GLUCOPHAGE) 1000 MG tablet Take 1 tablet (1,000 mg total) by mouth 2 (two) times daily with a meal.   No facility-administered encounter medications on file as of 11/05/2022.    Review of Systems  Constitutional:  Negative for appetite change, chills, fatigue, fever and unexpected weight change.  Respiratory:  Negative for cough, chest tightness, shortness of breath and wheezing.   Cardiovascular:  Negative for chest pain, palpitations and leg swelling.  Gastrointestinal:  Negative for abdominal distention, abdominal pain, nausea and vomiting.  Musculoskeletal:  Negative for arthralgias, back pain, gait problem, joint swelling, myalgias, neck pain and neck stiffness.  Skin:  Negative for color change, pallor, rash and wound.  Neurological:  Negative for dizziness, speech difficulty, weakness, light-headedness, numbness and headaches.       Tingling and numbness of left index and middle fingers  Psychiatric/Behavioral:  Negative for agitation, behavioral problems, hallucinations and sleep disturbance. The patient is not nervous/anxious.        Anxiety and depression stable on current medication following up with a counselor     Immunization History  Administered Date(s) Administered   Influenza,inj,Quad PF,6+ Mos 08/28/2014, 08/07/2015, 05/21/2018,  07/29/2020   Influenza-Unspecified 04/28/2012, 05/21/2018   MMR 09/11/2020   Moderna Sars-Covid-2 Vaccination 04/30/2020, 09/13/2020   PPD Test 10/28/2013   Pneumococcal Polysaccharide-23 01/09/2016   Tdap 03/06/2015, 01/09/2016, 07/04/2020   Pertinent  Health Maintenance Due  Topic Date Due   OPHTHALMOLOGY EXAM  Never done   INFLUENZA VACCINE  03/04/2023   HEMOGLOBIN A1C  03/26/2023   FOOT EXAM  09/16/2023   PAP SMEAR-Modifier  11/05/2023      09/11/2020    8:00 PM 09/12/2020    7:45 AM 09/12/2020    9:00 PM 09/13/2020    9:00 AM 09/15/2022    8:37 AM  Fall Risk  Falls in the past year?     0  Was there an injury with Fall?     0  Fall Risk Category Calculator     0  (RETIRED) Patient Fall Risk Level Low fall risk Moderate fall risk Low fall risk Moderate fall risk   Patient at Risk for Falls Due to  No Fall Risks  Fall risk Follow up     Falls evaluation completed   Functional Status Survey:    Vitals:   11/05/22 1325  BP: 124/78  Pulse: 90  Resp: 16  Temp: 97.6 F (36.4 C)  TempSrc: Temporal  SpO2: 99%  Weight: 243 lb 12.8 oz (110.6 kg)  Height: 5\' 8"  (1.727 m)   Body mass index is 37.07 kg/m. Physical Exam Vitals reviewed.  Constitutional:      General: She is not in acute distress.    Appearance: Normal appearance. She is obese. She is not ill-appearing or diaphoretic.  HENT:     Head: Normocephalic.  Cardiovascular:     Rate and Rhythm: Normal rate and regular rhythm.     Pulses: Normal pulses.     Heart sounds: Normal heart sounds. No murmur heard.    No friction rub. No gallop.     Comments: Left upper calf muscle ropey varicose vein tender to touch but without any erythema .No swelling on leg  noted.   Pulmonary:     Effort: Pulmonary effort is normal. No respiratory distress.     Breath sounds: Normal breath sounds. No wheezing, rhonchi or rales.  Chest:     Chest wall: No tenderness.  Musculoskeletal:        General: No swelling or  tenderness. Normal range of motion.     Right lower leg: No edema.     Left lower leg: No edema.  Skin:    General: Skin is warm and dry.     Coloration: Skin is not pale.     Findings: No bruising, erythema, lesion or rash.  Neurological:     Mental Status: She is alert and oriented to person, place, and time.     Cranial Nerves: No cranial nerve deficit.     Sensory: No sensory deficit.     Motor: No weakness.     Coordination: Coordination normal.     Gait: Gait normal.  Psychiatric:        Mood and Affect: Mood normal.        Speech: Speech normal.        Behavior: Behavior normal.        Thought Content: Thought content normal.        Judgment: Judgment normal.     Labs reviewed: Recent Labs    08/30/22 0802 09/25/22 0829 10/12/22 1218  NA 134* 133* 133*  K 4.2 4.2 3.3*  CL 100 101 100  CO2 24 22 23   GLUCOSE 304* 323* 372*  BUN <5* 8 5*  CREATININE 0.69 0.71 0.75  CALCIUM 8.6* 8.9 8.9   Recent Labs    08/20/22 1632 09/25/22 0829  AST 13* 14  ALT 13 10  ALKPHOS 91  --   BILITOT <0.1* 0.4  PROT 6.9 7.0  ALBUMIN 3.6  --    Recent Labs    08/30/22 0802 09/25/22 0829 10/12/22 1218  WBC 4.1 4.6 5.8  NEUTROABS 2.0 2,001 3.4  HGB 8.6* 6.9* 7.0*  HCT 27.8* 24.1* 23.9*  MCV 65.6* 62.9* 60.8*  PLT 332 283 329   Lab Results  Component Value Date   TSH 1.13 09/25/2022   Lab Results  Component Value Date   HGBA1C >14.0 (H) 09/25/2022   Lab Results  Component Value Date   CHOL 185 09/25/2022   HDL 53 09/25/2022   LDLCALC 114 (H) 09/25/2022   TRIG 82 09/25/2022   CHOLHDL 3.5 09/25/2022  Significant Diagnostic Results in last 30 days:  No results found.  Assessment/Plan 1. Varicose veins of left lower extremity with pain Left leg upper calf muscle painful ropey veins without erythema or swelling on the legs.recommend thigh high compression stockings on in the morning and off at bedtime. - will refer to vein and vascular specialist for  evaluation.   - Ambulatory referral to Vascular Surgery  2. Essential hypertension B/p well controlled  - COMPLETE METABOLIC PANEL WITH GFR; Future - CBC with Differential/Platelet; Future  3. Type 2 diabetes mellitus without complication, without long-term current use of insulin Lab Results  Component Value Date   HGBA1C >14.0 (H) 09/25/2022  Continue on Glargine  - dietary modification and exercise  - TSH; Future - COMPLETE METABOLIC PANEL WITH GFR; Future - CBC with Differential/Platelet; Future - Hemoglobin A1c; Future  4. Moderate episode of recurrent major depressive disorder Mood stable  - continue on sertraline  - continue to follow up with counseling service.  - TSH; Future - COMPLETE METABOLIC PANEL WITH GFR; Future  5. Hyperlipidemia LDL goal <100 LDL not at goal - continue on Atorvastatin  - dietary modification and exercise.  - Lipid Panel; Future  Family/ staff Communication: Reviewed plan of care with patient verbalized understanding  Labs/tests ordered:  - TSH; Future - COMPLETE METABOLIC PANEL WITH GFR; Future - Lipid Panel; Future - CBC with Differential/Platelet; Future - Hemoglobin A1c; Future  Next Appointment: Return in about 4 months (around 03/12/2023) for Fasting labs .   Sandrea Hughs, NP

## 2022-11-10 ENCOUNTER — Ambulatory Visit (HOSPITAL_COMMUNITY): Payer: Medicaid Other | Admitting: Clinical

## 2022-11-18 ENCOUNTER — Other Ambulatory Visit (HOSPITAL_COMMUNITY): Payer: Self-pay

## 2022-11-30 ENCOUNTER — Other Ambulatory Visit: Payer: Self-pay

## 2022-12-03 NOTE — Telephone Encounter (Signed)
Okay, please add novolog to medication list we also need to get her an appt in office or referral to endocrinology

## 2022-12-04 ENCOUNTER — Other Ambulatory Visit: Payer: Self-pay | Admitting: *Deleted

## 2022-12-04 ENCOUNTER — Other Ambulatory Visit: Payer: Self-pay

## 2022-12-04 DIAGNOSIS — I8393 Asymptomatic varicose veins of bilateral lower extremities: Secondary | ICD-10-CM

## 2022-12-04 MED ORDER — NOVOLOG FLEXPEN 100 UNIT/ML ~~LOC~~ SOPN: 15.0000 [IU] | PEN_INJECTOR | Freq: Three times a day (TID) | SUBCUTANEOUS | 1 refills | Status: DC

## 2022-12-07 ENCOUNTER — Other Ambulatory Visit: Payer: Self-pay

## 2022-12-08 ENCOUNTER — Encounter (HOSPITAL_COMMUNITY): Payer: Self-pay | Admitting: Physician Assistant

## 2022-12-08 ENCOUNTER — Ambulatory Visit (INDEPENDENT_AMBULATORY_CARE_PROVIDER_SITE_OTHER): Payer: Medicaid Other | Admitting: Physician Assistant

## 2022-12-08 DIAGNOSIS — F419 Anxiety disorder, unspecified: Secondary | ICD-10-CM

## 2022-12-08 DIAGNOSIS — F331 Major depressive disorder, recurrent, moderate: Secondary | ICD-10-CM

## 2022-12-08 MED ORDER — SERTRALINE HCL 50 MG PO TABS: 50.0000 mg | ORAL_TABLET | Freq: Every day | ORAL | 2 refills | Status: DC

## 2022-12-08 NOTE — Telephone Encounter (Signed)
Patient called regarding possibility of an endocrinology referral. There was no referral by [facility] in the system so I told the patient that Carilyn Goodpasture would be in the office tomorrow, 5/8, and if she felt like she needed a referral she could schedule an appointment. Patient declined scheduling an appointment for a referral at this time.

## 2022-12-08 NOTE — Progress Notes (Signed)
BH MD/PA/NP OP Progress Note  12/08/2022 12:47 PM KANIJAH NEST  MRN:  [id number]  Chief Complaint:  Chief Complaint  Patient presents with   Follow-up   Medication Refill   HPI:   Nevea Sartorius. [name] is a 37 year old, African-American female with a past psychiatric history significant for major depressive disorder and anxiety who presents to [facility] for follow-up and medication management.  Patient is currently being managed on the following medication: Sertraline 50 mg daily.  Patient presents to the encounter stating that she is somewhat exhausted due to switching jobs twice within the last month.  Patient reports that she is currently working for [employer] between the hours of 3 AM to 8 AM.  In regards to to her medication, patient reports that she has been taking her medications regularly.  Besides sleepiness, patient denies experiencing any other adverse side effects.  Patient reports that her mood has been okay and states that she has been doing better since starting the medication.  Patient denies depression but does endorse irritability between her and her husband, her "bonus son," and her mother.  Patient denies experiencing any major anxiety but states that when she does experience anxiety, she will try to put herself in her room and relax.  In regards to new stressors, patient states that she needs to decide what she wants to do workwise.  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 A stressor that is not bothering the patient anymore is that she recently moved into a new space with her family.  A GAD-7 screen was performed with the patient scoring a 12.  Patient is alert and oriented x 4, calm, cooperative, and fully engaged in conversation during the encounter.  Patient endorses sleeping mood but states that she is is otherwise okay.  Patient denies suicidal or homicidal ideations.  She further denies auditory or  visual hallucinations and does not appear to be responding to internal/external stimuli.  Patient endorses good sleep and receives on average 7 hours of sleep per night.  Patient endorses good appetite and eats on average 3 meals per day.  Patient endorses alcohol consumption on occasion.  Patient denies tobacco use and illicit drug use.  Visit Diagnosis:    ICD-10-CM   1. Moderate episode of recurrent major depressive disorder (HCC)  F33.1 sertraline (ZOLOFT) 50 MG tablet    2. Anxiety disorder, unspecified type  F41.9 sertraline (ZOLOFT) 50 MG tablet      Past Psychiatric History:  Patient endorses a past history depression and anxiety   Past Medical History:  Past Medical History:  Diagnosis Date   Asthma    COVID-19 07/04/2020   05/04/20   Diabetes mellitus without complication (HCC)    DKA (diabetic ketoacidoses) 08/27/2018   Family history of diabetes mellitus in mother 01/10/2018   Fibroid    pedunculated posterior RUQ near fundus. approx 3-4cm at time of 01/2016 c-section   GBS (group B streptococcus) infection    Hearing loss    pt states she was tested in high school and told it was selective she says she continues to have a deficit and does not feel it is selective   Herpes    Herpes simplex vulvovaginitis 09/06/2020   Hypertension    Newly diagnosed diabetes (HCC) 01/10/2018   Diagnosed 01/10/18  A1c 7.5%   Sickle cell trait (HCC) 09/01/2014   [ ]  ucx q trimester   Trichomonas vaginitis  Past Surgical History:  Procedure Laterality Date   CESAREAN SECTION N/A 01/08/2016   Procedure: CESAREAN SECTION;  Surgeon: Garland Bing, MD;  Location: [facility] BIRTHING SUITES;  Service: Obstetrics;  Laterality: N/A;   CESAREAN SECTION N/A 09/10/2020   Procedure: CESAREAN SECTION;  Surgeon: Levie Heritage, DO;  Location: [facility] LD ORS;  Service: Obstetrics;  Laterality: N/A;   DILATION AND CURETTAGE OF UTERUS     TUBAL LIGATION  09/10/2020   Procedure: BILATERAL TUBAL LIGATION;  Surgeon:  Levie Heritage, DO;  Location: [facility] LD ORS;  Service: Obstetrics;;   WISDOM TOOTH EXTRACTION      Family Psychiatric History:  Mother - patient reports that her mother states that she has "everything in the book" in regards to her mental health Sister - history of suicide and depression Brother (deceased) - depression   Family history of suicide attempt: Patient reports that her mother and sister have attempted suicide Family history of homicide attempt: Patient denies a family history of homicide attempts Family history of substance abuse: Patient reports that her mother abused drugs in the past  Family History:  Family History  Problem Relation Age of Onset   Diabetes Mother    Hypertension Mother    Hyperlipidemia Mother    Heart disease Mother    Breast cancer Mother        45   Ovarian cancer Mother        Unknown age   Diabetes Sister    Diabetes Brother    Breast cancer Cousin    Heart disease Maternal Grandmother     Social History:  Social History   Socioeconomic History   Marital status: Married    Spouse name: Not on file   Number of children: Not on file   Years of education: Not on file   Highest education level: Not on file  Occupational History   Not on file  Tobacco Use   Smoking status: Former    Types: Cigarettes   Smokeless tobacco: Never   Tobacco comments:    quit 2013  Vaping Use   Vaping Use: Never used  Substance and Sexual Activity   Alcohol use: No   Drug use: No   Sexual activity: Yes    Birth control/protection: None  Other Topics Concern   Not on file  Social History Narrative   Not on file   Social Determinants of Health   Financial Resource Strain: Low Risk  (01/10/2018)   Overall Financial Resource Strain (CARDIA)    Difficulty of Paying Living Expenses: Not very hard  Food Insecurity: Food Insecurity Present (10/21/2022)   Hunger Vital Sign    Worried About Running Out of Food in the Last Year: Often true    Ran Out of  Food in the Last Year: Often true  Transportation Needs: Unmet Transportation Needs (10/21/2022)   PRAPARE - Transportation    Lack of Transportation (Medical): No    Lack of Transportation (Non-Medical): Yes  Physical Activity: Insufficiently Active (01/10/2018)   Exercise Vital Sign    Days of Exercise per Week: 1 day    Minutes of Exercise per Session: 10 min  Stress: Stress Concern Present (01/10/2018)   [employer] of Occupational Health - Occupational Stress Questionnaire    Feeling of Stress : Very much  Social Connections: Moderately Isolated (01/10/2018)   Social Connection and Isolation Panel [NHANES]    Frequency of Communication with Friends and Family: Three times a week    Frequency  of Social Gatherings with Friends and Family: Once a week    Attends Religious Services: Never    Database administrator or Organizations: No    Attends Engineer, structural: Never    Marital Status: Separated    Allergies: No Known Allergies  Metabolic Disorder Labs: Lab Results  Component Value Date   HGBA1C >14.0 (H) 09/25/2022   MPG  09/25/2022     Comment:     eAG cannot be calculated. Hemoglobin A1c result exceeds the linearity of the assay. . HbA1c performed on Roche platform. Effective 05/11/22 a change in test platforms may have  shifted HbA1c results compared to historical results.    MPG 162.81 09/10/2020   No results found for: "PROLACTIN" Lab Results  Component Value Date   CHOL 185 09/25/2022   TRIG 82 09/25/2022   HDL 53 09/25/2022   CHOLHDL 3.5 09/25/2022   LDLCALC 114 (H) 09/25/2022   LDLCALC 138 (H) 12/05/2020   Lab Results  Component Value Date   TSH 1.13 09/25/2022   TSH 1.350 11/28/2020    Therapeutic Level Labs: No results found for: "LITHIUM" No results found for: "VALPROATE" No results found for: "CBMZ"  Current Medications: Current Outpatient Medications  Medication Sig Dispense Refill   albuterol (VENTOLIN HFA) 108 (90 Base)  MCG/ACT inhaler Inhale 2 puffs into the lungs every 6 (six) hours as needed for wheezing or shortness of breath. 1 each 1   ascorbic acid (VITAMIN C) 500 MG tablet Take 500 mg by mouth 2 (two) times daily.     atorvastatin (LIPITOR) 20 MG tablet Take 1 tablet (20 mg total) by mouth daily. 90 tablet 1   Continuous Blood Gluc Receiver (FREESTYLE LIBRE 3 READER) DEVI 1 Device by Does not apply route in the morning, at noon, in the evening, and at bedtime. 1 each 5   Continuous Glucose Sensor (FREESTYLE LIBRE 3 SENSOR) MISC Use as directed every 14 days 8 each 0   cyclobenzaprine (FLEXERIL) 10 MG tablet Take 1 tablet (10 mg total) by mouth as needed for muscle spasms. 30 tablet 1   ferrous sulfate 325 (65 FE) MG EC tablet Take 1 tablet (325 mg total) by mouth 2 (two) times daily with a meal. 90 tablet 1   insulin aspart (NOVOLOG FLEXPEN) 100 UNIT/ML FlexPen Inject 15 Units into the skin 3 (three) times daily with meals. 15 mL 1   Insulin Glargine (BASAGLAR KWIKPEN) 100 UNIT/ML Inject 24 Units into the skin at bedtime. 9 mL 3   medroxyPROGESTERone (PROVERA) 5 MG tablet Take 2 tablets (10 mg total) by mouth daily. 60 tablet 1   Prenatal 28-0.8 MG TABS Take 1 tablet by mouth daily. 30 tablet 12   sertraline (ZOLOFT) 50 MG tablet Take 1 tablet (50 mg total) by mouth daily. 30 tablet 2   Vitamin D, Ergocalciferol, (DRISDOL) 1.25 MG (50000 UNIT) CAPS capsule Take 1 capsule (50,000 Units total) by mouth every 7 (seven) days. 4 capsule 2   No current facility-administered medications for this visit.     Musculoskeletal: Strength & Muscle Tone: within normal limits Gait & Station: normal Patient leans: N/A  Psychiatric Specialty Exam: Review of Systems  Psychiatric/Behavioral:  Negative for decreased concentration, dysphoric mood, hallucinations, self-injury, sleep disturbance and suicidal ideas. The patient is nervous/anxious. The patient is not hyperactive.     Blood pressure 131/83, pulse 93,  temperature 98.3 F (36.8 C), temperature source Oral, height 5\' 9"  (1.753 m), weight 235 lb (106.6 kg),  SpO2 100 %.Body mass index is 34.7 kg/m.  General Appearance: Casual  Eye Contact:  Good  Speech:  Clear and Coherent and Normal Rate  Volume:  Normal  Mood:  Anxious  Affect:  Appropriate  Thought Process:  Coherent and Descriptions of Associations: Intact  Orientation:  Full (Time, Place, and Person)  Thought Content: WDL   Suicidal Thoughts:  No  Homicidal Thoughts:  No  Memory:  Immediate;   Good Recent;   Good Remote;   Good  Judgement:  Good  Insight:  Good  Psychomotor Activity:  Normal  Concentration:  Concentration: Good and Attention Span: Good  Recall:  Good  Fund of Knowledge: Good  Language: Good  Akathisia:  No  Handed:  Right  AIMS (if indicated): not done  Assets:  Communication Skills Desire for Improvement Social Support  ADL's:  Intact  Cognition: WNL  Sleep:  Good   Screenings: GAD-7    Flowsheet Row Clinical Support from 12/08/2022 in [facility] Office Visit from 10/21/2022 in [facility] Clinical Support from 10/13/2022 in [facility] Office Visit from 09/09/2022 in [facility] at [facility] Office Visit from 09/08/2022 in [facility]  Total GAD-7 Score 12 19 19 20 20       PHQ2-9    Flowsheet Row Clinical Support from 12/08/2022 in [facility] Office Visit from 10/21/2022 in [facility] Clinical Support from 10/13/2022 in [facility] Office Visit from 09/09/2022 in [facility] Office Visit from 09/08/2022 in [facility]  PHQ-2 Total Score 0 5 5 6 4   PHQ-9 Total Score -- 18 15 22 19        Flowsheet Row Clinical Support from 12/08/2022 in [facility] Clinical Support from 10/13/2022 in [facility] ED from 10/12/2022 in [facility] Department at [facility]  C-SSRS RISK CATEGORY No Risk No Risk No Risk        Assessment and Plan:   Nainika Bencivengo. [name] is a 37 year old, African-American female with a past psychiatric history significant for major depressive disorder and anxiety who presents to [facility] for follow-up and medication management.  Patient presents to the encounter stating that she has been taking her medications regularly with no major issues besides some sleepiness.  Patient continues to tolerate the medication well and denies experiencing depressive symptoms.  Patient also endorses minimal anxiety and states that whenever anxiety she does experience is alleviated through relaxation.  Patient would like to continue taking her medication as prescribed.  Patient's medication to be e-prescribed to pharmacy of choice.  Provider discussed with patient potential adverse side effects to her current medication.  Patient vocalized understanding.  Collaboration of Care: Collaboration of Care: Medication Management AEB provider managing patient's psychiatric medications, Psychiatrist AEB patient being seen by mental health provider at this facility, and Referral or follow-up with counselor/therapist AEB patient being seen by a licensed clinical social worker at this facility  Patient/Guardian was advised Release of Information must be obtained prior to any record release in order to collaborate their care with an outside provider. Patient/Guardian was advised if they have not already done so to contact the registration department to sign all necessary forms  in order for [place] to release information regarding their care.   Consent: Patient/Guardian gives verbal consent  for treatment and assignment of benefits for services provided during this visit. Patient/Guardian expressed understanding and agreed to proceed.   1. Moderate episode of recurrent major depressive disorder (HCC)  - sertraline (ZOLOFT) 50 MG tablet; Take 1 tablet (50 mg total) by mouth daily.  Dispense: 30 tablet; Refill: 2  2. Anxiety disorder, unspecified type  - sertraline (ZOLOFT) 50 MG tablet; Take 1 tablet (50 mg total) by mouth daily.  Dispense: 30 tablet; Refill: 2  Patient to follow-up in 2 months Provider spent a total of 18 minutes with the patient/reviewing patient's chart  Meta Hatchet, PA 12/08/2022, 12:47 PM
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
## 2022-12-10 ENCOUNTER — Ambulatory Visit (HOSPITAL_COMMUNITY)
Admission: RE | Admit: 2022-12-10 | Discharge: 2022-12-10 | Disposition: A | Discharge status: Home / Self Care | Payer: Commercial Managed Care - HMO | Source: Ambulatory Visit | Attending: Vascular Surgery | Admitting: Vascular Surgery

## 2022-12-10 ENCOUNTER — Ambulatory Visit (INDEPENDENT_AMBULATORY_CARE_PROVIDER_SITE_OTHER): Payer: Commercial Managed Care - HMO | Admitting: Physician Assistant

## 2022-12-10 ENCOUNTER — Encounter: Payer: Self-pay | Admitting: Physician Assistant

## 2022-12-10 VITALS — BP 120/75 | HR 94 | Temp 97.8°F | Resp 18 | Ht 69.0 in | Wt 234.6 lb

## 2022-12-10 DIAGNOSIS — I8393 Asymptomatic varicose veins of bilateral lower extremities: Secondary | ICD-10-CM

## 2022-12-18 ENCOUNTER — Ambulatory Visit (INDEPENDENT_AMBULATORY_CARE_PROVIDER_SITE_OTHER): Payer: Commercial Managed Care - HMO | Admitting: Family

## 2022-12-18 ENCOUNTER — Encounter: Payer: Self-pay | Admitting: Family

## 2022-12-18 VITALS — BP 122/74 | HR 90 | Ht 69.0 in | Wt 238.4 lb

## 2022-12-18 DIAGNOSIS — R202 Paresthesia of skin: Secondary | ICD-10-CM

## 2022-12-18 DIAGNOSIS — R200 Anesthesia of skin: Secondary | ICD-10-CM

## 2022-12-18 DIAGNOSIS — E1142 Type 2 diabetes mellitus with diabetic polyneuropathy: Secondary | ICD-10-CM | POA: Diagnosis not present

## 2022-12-18 MED ORDER — SEMAGLUTIDE(0.25 OR 0.5MG/DOS) 2 MG/3ML ~~LOC~~ SOPN: 0.2500 mg | PEN_INJECTOR | SUBCUTANEOUS | 0 refills | Status: DC

## 2022-12-18 NOTE — Progress Notes (Unsigned)
Provider: Richarda Blade FNP-C  Tomesha Sargent, Donalee Citrin, NP  Patient Care Team: Mikylah Ackroyd, Donalee Citrin, NP as PCP - General (Family Medicine)  Extended Emergency Contact Information Primary Emergency Contact: [facility] Phone: [phone] Relation: Spouse Secondary Emergency Contact: [place] Phone: [phone] Relation: Friend Preferred language: English Interpreter needed? No  Code Status:  Full Code  Goals of care: Advanced Directive information    09/15/2022    8:36 AM  Advanced Directives  Does Patient Have a Medical Advance Directive? No  Would patient like information on creating a medical advance directive? No - Patient declined     Chief Complaint  Patient presents with   Acute Visit    Patient presents today for possible referral for neuropathy pain.    HPI:  Pt is a 37 y.o. female seen today for an acute visit for evaluation of left hand numbness and tingling.also has numbness on the feet. Request referral to Neurologist. Also request referral to Endocrinologist.No CBG log for review but states blood sugars are all over the place in the 200's -300's.    Past Medical History:  Diagnosis Date   Asthma    COVID-19 07/04/2020   05/04/20   Diabetes mellitus without complication (HCC)    DKA (diabetic ketoacidoses) 08/27/2018   Family history of diabetes mellitus in mother 01/10/2018   Fibroid    pedunculated posterior RUQ near fundus. approx 3-4cm at time of 01/2016 c-section   GBS (group B streptococcus) infection    Hearing loss    pt states she was tested in high school and told it was selective she says she continues to have a deficit and does not feel it is selective   Herpes    Herpes simplex vulvovaginitis 09/06/2020   Hypertension    Newly diagnosed diabetes (HCC) 01/10/2018   Diagnosed 01/10/18  A1c 7.5%   Sickle cell trait (HCC) 09/01/2014   [ ]  ucx q trimester   Trichomonas vaginitis    Past Surgical History:  Procedure Laterality  Date   CESAREAN SECTION N/A 01/08/2016   Procedure: CESAREAN SECTION;  Surgeon: Buhler Bing, MD;  Location: [facility];  Service: Obstetrics;  Laterality: N/A;   CESAREAN SECTION N/A 09/10/2020   Procedure: CESAREAN SECTION;  Surgeon: Levie Heritage, DO;  Location: [facility] LD ORS;  Service: Obstetrics;  Laterality: N/A;   DILATION AND CURETTAGE OF UTERUS     TUBAL LIGATION  09/10/2020   Procedure: BILATERAL TUBAL LIGATION;  Surgeon: Levie Heritage, DO;  Location: [facility] LD ORS;  Service: Obstetrics;;   WISDOM TOOTH EXTRACTION      No Known Allergies  Outpatient Encounter Medications as of 12/18/2022  Medication Sig   albuterol (VENTOLIN HFA) 108 (90 Base) MCG/ACT inhaler Inhale 2 puffs into the lungs every 6 (six) hours as needed for wheezing or shortness of breath.   Continuous Blood Gluc Receiver (FREESTYLE LIBRE 3 READER) DEVI 1 Device by Does not apply route in the morning, at noon, in the evening, and at bedtime.   Continuous Glucose Sensor (FREESTYLE LIBRE 3 SENSOR) MISC Use as directed every 14 days   cyclobenzaprine (FLEXERIL) 10 MG tablet Take 1 tablet (10 mg total) by mouth as needed for muscle spasms.   insulin aspart (NOVOLOG FLEXPEN) 100 UNIT/ML FlexPen Inject 15 Units into the skin 3 (three) times daily with meals.   Insulin Glargine (BASAGLAR KWIKPEN) 100 UNIT/ML Inject 24 Units into the skin at bedtime.   medroxyPROGESTERone (PROVERA) 5 MG tablet Take 2 tablets (10  mg total) by mouth daily.   Prenatal 28-0.8 MG TABS Take 1 tablet by mouth daily.   sertraline (ZOLOFT) 50 MG tablet Take 1 tablet (50 mg total) by mouth daily.   Vitamin D, Ergocalciferol, (DRISDOL) 1.25 MG (50000 UNIT) CAPS capsule Take 1 capsule (50,000 Units total) by mouth every 7 (seven) days.   atorvastatin (LIPITOR) 20 MG tablet Take 1 tablet (20 mg total) by mouth daily. (Patient not taking: Reported on 12/10/2022)   ferrous sulfate 325 (65 FE) MG EC tablet Take 1 tablet (325 mg total) by mouth 2 (two)  times daily with a meal. (Patient not taking: Reported on 12/10/2022)   [DISCONTINUED] metFORMIN (GLUCOPHAGE) 1000 MG tablet Take 1 tablet (1,000 mg total) by mouth 2 (two) times daily with a meal.   No facility-administered encounter medications on file as of 12/18/2022.    Review of Systems  Constitutional:  Negative for appetite change, chills, fatigue, fever and unexpected weight change.  HENT:  Negative for congestion, dental problem, ear discharge, ear pain, facial swelling, hearing loss, nosebleeds, postnasal drip, rhinorrhea, sinus pressure, sinus pain, sneezing, sore throat, tinnitus and trouble swallowing.   Eyes:  Negative for pain, discharge, redness, itching and visual disturbance.  Respiratory:  Negative for cough, chest tightness, shortness of breath and wheezing.   Cardiovascular:  Negative for chest pain, palpitations and leg swelling.  Gastrointestinal:  Negative for abdominal distention, abdominal pain, blood in stool, constipation, diarrhea, nausea and vomiting.  Endocrine: Negative for cold intolerance, heat intolerance, polydipsia, polyphagia and polyuria.  Genitourinary:  Negative for difficulty urinating, dysuria, flank pain, frequency and urgency.  Musculoskeletal:  Negative for arthralgias, back pain, gait problem, joint swelling, myalgias, neck pain and neck stiffness.  Skin:  Negative for color change, pallor, rash and wound.  Neurological:  Negative for dizziness, syncope, speech difficulty, weakness, light-headedness, numbness and headaches.  Hematological:  Does not bruise/bleed easily.  Psychiatric/Behavioral:  Negative for agitation, behavioral problems, confusion, hallucinations, self-injury, sleep disturbance and suicidal ideas. The patient is not nervous/anxious.     Immunization History  Administered Date(s) Administered   Influenza,inj,Quad PF,6+ Mos 08/28/2014, 08/07/2015, 05/21/2018, 07/29/2020   Influenza-Unspecified 04/28/2012, 05/21/2018   MMR  09/11/2020   Moderna Sars-Covid-2 Vaccination 04/30/2020, 09/13/2020   PPD Test 10/28/2013   Pneumococcal Polysaccharide-23 01/09/2016   Tdap 03/06/2015, 01/09/2016, 07/04/2020   Pertinent  Health Maintenance Due  Topic Date Due   OPHTHALMOLOGY EXAM  Never done   INFLUENZA VACCINE  03/04/2023   HEMOGLOBIN A1C  03/26/2023   FOOT EXAM  09/16/2023   PAP SMEAR-Modifier  11/05/2023      09/11/2020    8:00 PM 09/12/2020    7:45 AM 09/12/2020    9:00 PM 09/13/2020    9:00 AM 09/15/2022    8:37 AM  Fall Risk  Falls in the past year?     0  Was there an injury with Fall?     0  Fall Risk Category Calculator     0  (RETIRED) Patient Fall Risk Level Low fall risk Moderate fall risk Low fall risk Moderate fall risk   Patient at Risk for Falls Due to     No Fall Risks  Fall risk Follow up     Falls evaluation completed   Functional Status Survey:    Vitals:   12/18/22 1341  BP: 122/74  Pulse: 90  SpO2: 98%  Weight: 238 lb 6.4 oz (108.1 kg)  Height: 5\' 9"  (1.753 m)   Body mass index is  35.21 kg/m. Physical Exam Vitals reviewed.  Constitutional:      General: She is not in acute distress.    Appearance: Normal appearance. She is obese. She is not ill-appearing or diaphoretic.  HENT:     Head: Normocephalic.     Nose: Nose normal. No congestion or rhinorrhea.     Mouth/Throat:     Mouth: Mucous membranes are moist.     Pharynx: Oropharynx is clear. No oropharyngeal exudate or posterior oropharyngeal erythema.  Eyes:     General: No scleral icterus.       Right eye: No discharge.        Left eye: No discharge.     Extraocular Movements: Extraocular movements intact.     Conjunctiva/sclera: Conjunctivae normal.     Pupils: Pupils are equal, round, and reactive to light.  Neck:     Vascular: No carotid bruit.  Cardiovascular:     Rate and Rhythm: Normal rate and regular rhythm.     Pulses: Normal pulses.     Heart sounds: Normal heart sounds. No murmur heard.    No friction  rub. No gallop.  Pulmonary:     Effort: Pulmonary effort is normal. No respiratory distress.     Breath sounds: Normal breath sounds. No wheezing, rhonchi or rales.  Chest:     Chest wall: No tenderness.  Abdominal:     General: Bowel sounds are normal. There is no distension.     Palpations: Abdomen is soft. There is no mass.     Tenderness: There is no abdominal tenderness. There is no right CVA tenderness, left CVA tenderness, guarding or rebound.  Musculoskeletal:        General: No swelling or tenderness. Normal range of motion.     Cervical back: Normal range of motion. No rigidity or tenderness.     Right lower leg: No edema.     Left lower leg: No edema.  Lymphadenopathy:     Cervical: No cervical adenopathy.  Skin:    General: Skin is warm and dry.     Coloration: Skin is not pale.     Findings: No bruising, erythema, lesion or rash.  Neurological:     Mental Status: She is alert and oriented to person, place, and time.     Cranial Nerves: No cranial nerve deficit.     Sensory: No sensory deficit.     Motor: No weakness.     Coordination: Coordination normal.     Gait: Gait normal.  Psychiatric:        Mood and Affect: Mood normal.        Speech: Speech normal.        Behavior: Behavior normal.        Thought Content: Thought content normal.        Judgment: Judgment normal.     Labs reviewed: Recent Labs    08/30/22 0802 09/25/22 0829 10/12/22 1218  NA 134* 133* 133*  K 4.2 4.2 3.3*  CL 100 101 100  CO2 24 22 23   GLUCOSE 304* 323* 372*  BUN <5* 8 5*  CREATININE 0.69 0.71 0.75  CALCIUM 8.6* 8.9 8.9   Recent Labs    08/20/22 1632 09/25/22 0829  AST 13* 14  ALT 13 10  ALKPHOS 91  --   BILITOT <0.1* 0.4  PROT 6.9 7.0  ALBUMIN 3.6  --    Recent Labs    08/30/22 0802 09/25/22 0829 10/12/22 1218  WBC 4.1 4.6 5.8  NEUTROABS 2.0 2,001 3.4  HGB 8.6* 6.9* 7.0*  HCT 27.8* 24.1* 23.9*  MCV 65.6* 62.9* 60.8*  PLT 332 283 329   Lab Results   Component Value Date   TSH 1.13 09/25/2022   Lab Results  Component Value Date   HGBA1C >14.0 (H) 09/25/2022   Lab Results  Component Value Date   CHOL 185 09/25/2022   HDL 53 09/25/2022   LDLCALC 114 (H) 09/25/2022   TRIG 82 09/25/2022   CHOLHDL 3.5 09/25/2022    Significant Diagnostic Results in last 30 days:  VAS [place] LOWER EXTREMITY VENOUS REFLUX  Result Date: 12/10/2022  Lower Venous Reflux Study Patient Name:  YAIRI SABAS  Date of Exam:   12/10/2022 Medical Rec #: [id number]             Accession #:    [id number] Date of Birth: 01/01/1986             Patient Gender: F Patient Age:   36 years Exam Location:  [facility] Procedure:      VAS [place] LOWER EXTREMITY VENOUS REFLUX Referring Phys: Lelon Mast RHYNE --------------------------------------------------------------------------------  Indications: Edema, and varicosities.  Comparison Study: no prior Performing Technologist: [place] Ponder RVS  Examination Guidelines: A complete evaluation includes B-mode imaging, spectral Doppler, color Doppler, and power Doppler as needed of all accessible portions of each vessel. Bilateral testing is considered an integral part of a complete examination. Limited examinations for reoccurring indications may be performed as noted. The reflux portion of the exam is performed with the patient in reverse Trendelenburg. Significant venous reflux is defined as >500 ms in the superficial venous system, and >1 second in the deep venous system.  +--------------+---------+------+-----------+------------+-------------+ LEFT          Reflux NoRefluxReflux TimeDiameter cmsComments                              Yes                                       +--------------+---------+------+-----------+------------+-------------+ CFV                     yes   >1 second                           +--------------+---------+------+-----------+------------+-------------+ FV mid                   yes   >1 second                           +--------------+---------+------+-----------+------------+-------------+ Popliteal     no                                                  +--------------+---------+------+-----------+------------+-------------+ GSV at SFJ              yes    >500 ms      .577                  +--------------+---------+------+-----------+------------+-------------+ GSV prox thigh          yes    >  500 ms      .432                  +--------------+---------+------+-----------+------------+-------------+ GSV mid thigh           yes    >500 ms      .280                  +--------------+---------+------+-----------+------------+-------------+ GSV dist thighno                            .294                  +--------------+---------+------+-----------+------------+-------------+ GSV at knee   no                            .315                  +--------------+---------+------+-----------+------------+-------------+ GSV prox calf no                            .265                  +--------------+---------+------+-----------+------------+-------------+ GSV mid calf                                        out of fascia +--------------+---------+------+-----------+------------+-------------+ SSV Pop Fossa no                            .251                  +--------------+---------+------+-----------+------------+-------------+ SSV prox calf no                            .197                  +--------------+---------+------+-----------+------------+-------------+ SSV mid calf  no                            .178                  +--------------+---------+------+-----------+------------+-------------+   Summary: Left: - No evidence of deep vein thrombosis seen in the left lower extremity, from the common femoral through the popliteal veins. - No evidence of superficial venous thrombosis in the left lower extremity. -  Venous reflux is noted in the left common femoral vein. - Venous reflux is noted in the left sapheno-femoral junction. - Venous reflux is noted in the left greater saphenous vein in the thigh. - Venous reflux is noted in the left femoral vein.  *See table(s) above for measurements and observations. Electronically signed by Waverly Ferrari MD on 12/10/2022 at 10:22:00 AM.    Final     Assessment/Plan There are no diagnoses linked to this encounter.   Family/ staff Communication: Reviewed plan of care with patient  Labs/tests ordered: None   Next Appointment:   Caesar Bookman, NP

## 2022-12-21 ENCOUNTER — Ambulatory Visit: Payer: Commercial Managed Care - HMO | Admitting: Obstetrics & Gynecology

## 2022-12-23 ENCOUNTER — Other Ambulatory Visit (HOSPITAL_COMMUNITY): Payer: Self-pay

## 2022-12-23 ENCOUNTER — Other Ambulatory Visit: Payer: Self-pay

## 2022-12-23 DIAGNOSIS — E1142 Type 2 diabetes mellitus with diabetic polyneuropathy: Secondary | ICD-10-CM

## 2022-12-23 MED ORDER — SEMAGLUTIDE(0.25 OR 0.5MG/DOS) 2 MG/3ML ~~LOC~~ SOPN
0.2500 mg | PEN_INJECTOR | SUBCUTANEOUS | 0 refills | Status: DC
  Filled 2022-12-23: qty 3, 28d supply, fill #0

## 2022-12-29 ENCOUNTER — Encounter: Payer: Self-pay | Admitting: Neurology

## 2022-12-31 ENCOUNTER — Other Ambulatory Visit (HOSPITAL_COMMUNITY): Payer: Self-pay

## 2023-01-08 ENCOUNTER — Other Ambulatory Visit: Payer: Self-pay | Admitting: Obstetrics & Gynecology

## 2023-01-08 ENCOUNTER — Other Ambulatory Visit: Payer: Self-pay | Admitting: Nurse Practitioner

## 2023-01-08 ENCOUNTER — Other Ambulatory Visit (HOSPITAL_COMMUNITY): Payer: Self-pay

## 2023-01-08 DIAGNOSIS — D219 Benign neoplasm of connective and other soft tissue, unspecified: Secondary | ICD-10-CM

## 2023-01-15 ENCOUNTER — Other Ambulatory Visit: Payer: Self-pay

## 2023-01-18 ENCOUNTER — Encounter: Payer: Self-pay | Admitting: Family

## 2023-01-18 ENCOUNTER — Ambulatory Visit (INDEPENDENT_AMBULATORY_CARE_PROVIDER_SITE_OTHER): Payer: Commercial Managed Care - HMO | Admitting: Family

## 2023-01-18 VITALS — BP 128/82 | HR 95 | Temp 98.0°F | Ht 69.0 in | Wt 239.0 lb

## 2023-01-18 DIAGNOSIS — D5740 Sickle-cell thalassemia without crisis: Secondary | ICD-10-CM | POA: Diagnosis not present

## 2023-01-18 DIAGNOSIS — E1142 Type 2 diabetes mellitus with diabetic polyneuropathy: Secondary | ICD-10-CM | POA: Diagnosis not present

## 2023-01-18 MED ORDER — INSULIN ASPART FLEXPEN 100 UNIT/ML ~~LOC~~ SOPN
PEN_INJECTOR | SUBCUTANEOUS | 2 refills | Status: DC
  Filled 2023-05-06: qty 15, 33d supply, fill #0

## 2023-01-18 MED ORDER — BASAGLAR KWIKPEN 100 UNIT/ML ~~LOC~~ SOPN: 24.0000 [IU] | PEN_INJECTOR | Freq: Every day | SUBCUTANEOUS | 3 refills | Status: DC

## 2023-01-18 NOTE — Progress Notes (Signed)
Provider: Richarda Blade FNP-C  [name], Raven Citrin, NP  Patient Care Team: [name], Raven Citrin, NP as PCP - General (Family Medicine)  Extended Emergency Contact Information Primary Emergency Contact: [name] Phone: [phone] Relation: Spouse Secondary Emergency Contact: [name] Mobile Phone: [phone] Relation: Friend Preferred language: English Interpreter needed? No  Code Status:  Full Code  Goals of care: Advanced Directive information    09/15/2022    8:36 AM  Advanced Directives  Does Patient Have a Medical Advance Directive? No  Would patient like information on creating a medical advance directive? No - Patient declined     Chief Complaint  Patient presents with   Medical Management of Chronic Issues    Patient presents today for a 1 month follow-up   Quality Metric Gaps    Eye exam, COVID#3    HPI:  Pt is a 37 y.o. female seen today for one month follow up for high blood sugar.states CBG ha been reading high.states has been eating more salads and chicken.Also snacks on Olives. Does not exercise on regular but has strenuous job lifting and walking 10,000 steps daily.  She was prescribed Ozempic but states was not covered by insurance. Has not been using Novolog and Lantus on regular basis states gives her cramps. She was referred to Ophthalmology but states insurance did not covered and could not pay out of pocket.    Past Medical History:  Diagnosis Date   Asthma    COVID-19 07/04/2020   05/04/20   Diabetes mellitus without complication (HCC)    DKA (diabetic ketoacidoses) 08/27/2018   Family history of diabetes mellitus in mother 01/10/2018   Fibroid    pedunculated posterior RUQ near fundus. approx 3-4cm at time of 01/2016 c-section   GBS (group B streptococcus) infection    Hearing loss    pt states she was tested in high school and told it was selective she says she continues to have a deficit and does not feel it is selective    Herpes    Herpes simplex vulvovaginitis 09/06/2020   Hypertension    Newly diagnosed diabetes (HCC) 01/10/2018   Diagnosed 01/10/18  A1c 7.5%   Sickle cell trait (HCC) 09/01/2014   [ ]  ucx q trimester   Trichomonas vaginitis    Past Surgical History:  Procedure Laterality Date   CESAREAN SECTION N/A 01/08/2016   Procedure: CESAREAN SECTION;  Surgeon: [place] Bing, MD;  Location: [facility] BIRTHING SUITES;  Service: Obstetrics;  Laterality: N/A;   CESAREAN SECTION N/A 09/10/2020   Procedure: CESAREAN SECTION;  Surgeon: Levie Heritage, DO;  Location: [facility] LD ORS;  Service: Obstetrics;  Laterality: N/A;   DILATION AND CURETTAGE OF UTERUS     TUBAL LIGATION  09/10/2020   Procedure: BILATERAL TUBAL LIGATION;  Surgeon: Levie Heritage, DO;  Location: [facility] LD ORS;  Service: Obstetrics;;   WISDOM TOOTH EXTRACTION      No Known Allergies  Outpatient Encounter Medications as of 01/18/2023  Medication Sig   albuterol (VENTOLIN HFA) 108 (90 Base) MCG/ACT inhaler Inhale 2 puffs into the lungs every 6 (six) hours as needed for wheezing or shortness of breath.   atorvastatin (LIPITOR) 20 MG tablet Take 1 tablet (20 mg total) by mouth daily.   Continuous Blood Gluc Receiver (FREESTYLE LIBRE 3 READER) DEVI 1 Device by Does not apply route in the morning, at noon, in the evening, and at bedtime.   Continuous Glucose Sensor (FREESTYLE LIBRE 3 SENSOR) MISC Use as directed every 14  days   cyclobenzaprine (FLEXERIL) 10 MG tablet Take 1 tablet (10 mg total) by mouth as needed for muscle spasms.   ferrous sulfate 325 (65 FE) MG EC tablet Take 1 tablet (325 mg total) by mouth 2 (two) times daily with a meal.   Insulin Aspart FlexPen (NOVOLOG) 100 UNIT/ML INJECT 15 UNITS INTO THE SKIN THREE TIMES DAILY WITH MEALS.   Insulin Glargine (BASAGLAR KWIKPEN) 100 UNIT/ML Inject 24 Units into the skin at bedtime.   medroxyPROGESTERone (PROVERA) 5 MG tablet Take 2 tablets (10 mg total) by mouth daily.   Prenatal 28-0.8 MG TABS Take 1  tablet by mouth daily.   sertraline (ZOLOFT) 50 MG tablet Take 1 tablet (50 mg total) by mouth daily.   Vitamin D, Ergocalciferol, (DRISDOL) 1.25 MG (50000 UNIT) CAPS capsule Take 1 capsule (50,000 Units total) by mouth every 7 (seven) days.   Semaglutide,0.25 or 0.5MG /DOS, 2 MG/3ML SOPN Inject 0.25 mg into the skin once a week. (Patient not taking: Reported on 01/18/2023)   [DISCONTINUED] metFORMIN (GLUCOPHAGE) 1000 MG tablet Take 1 tablet (1,000 mg total) by mouth 2 (two) times daily with a meal.   No facility-administered encounter medications on file as of 01/18/2023.    Review of Systems  Constitutional:  Negative for appetite change, chills, fatigue, fever and unexpected weight change.  Eyes:  Negative for pain, discharge, redness, itching and visual disturbance.  Respiratory:  Negative for cough, chest tightness, shortness of breath and wheezing.   Cardiovascular:  Negative for chest pain, palpitations and leg swelling.  Gastrointestinal:  Negative for abdominal distention, abdominal pain, constipation, nausea and vomiting.  Endocrine: Negative for cold intolerance, heat intolerance, polydipsia, polyphagia and polyuria.  Genitourinary:  Negative for difficulty urinating, dysuria, flank pain, frequency and urgency.  Skin:  Negative for color change, pallor and rash.  Neurological:  Negative for dizziness, syncope, speech difficulty, weakness, light-headedness, numbness and headaches.  Hematological:  Does not bruise/bleed easily.  Psychiatric/Behavioral:  Negative for agitation, behavioral problems, confusion, hallucinations, self-injury, sleep disturbance and suicidal ideas. The patient is not nervous/anxious.     Immunization History  Administered Date(s) Administered   Influenza,inj,Quad PF,6+ Mos 08/28/2014, 08/07/2015, 05/21/2018, 07/29/2020   Influenza-Unspecified 04/28/2012, 05/21/2018   MMR 09/11/2020   Moderna Sars-Covid-2 Vaccination 04/30/2020, 09/13/2020   PPD Test  10/28/2013   Pneumococcal Polysaccharide-23 01/09/2016   Tdap 03/06/2015, 01/09/2016, 07/04/2020   Pertinent  Health Maintenance Due  Topic Date Due   OPHTHALMOLOGY EXAM  Never done   INFLUENZA VACCINE  03/04/2023   HEMOGLOBIN A1C  03/26/2023   FOOT EXAM  09/16/2023   PAP SMEAR-Modifier  11/05/2023      09/11/2020    8:00 PM 09/12/2020    7:45 AM 09/12/2020    9:00 PM 09/13/2020    9:00 AM 09/15/2022    8:37 AM  Fall Risk  Falls in the past year?     0  Was there an injury with Fall?     0  Fall Risk Category Calculator     0  (RETIRED) Patient Fall Risk Level Low fall risk Moderate fall risk Low fall risk Moderate fall risk   Patient at Risk for Falls Due to     No Fall Risks  Fall risk Follow up     Falls evaluation completed   Functional Status Survey:    Vitals:   01/18/23 0923  BP: 128/82  Pulse: 95  Temp: 98 F (36.7 C)  SpO2: 99%  Weight: 239 lb (108.4 kg)  Height: 5\' 9"  (1.753 m)   Body mass index is 35.29 kg/m. Physical Exam Vitals reviewed.  Constitutional:      General: She is not in acute distress.    Appearance: Normal appearance. She is obese. She is not ill-appearing or diaphoretic.  HENT:     Head: Normocephalic.     Mouth/Throat:     Mouth: Mucous membranes are moist.     Pharynx: Oropharynx is clear. No oropharyngeal exudate or posterior oropharyngeal erythema.  Eyes:     General: No scleral icterus.       Right eye: No discharge.        Left eye: No discharge.     Conjunctiva/sclera: Conjunctivae normal.     Pupils: Pupils are equal, round, and reactive to light.  Neck:     Vascular: No carotid bruit.  Cardiovascular:     Rate and Rhythm: Normal rate and regular rhythm.     Pulses: Normal pulses.     Heart sounds: Normal heart sounds. No murmur heard.    No friction rub. No gallop.  Pulmonary:     Effort: Pulmonary effort is normal. No respiratory distress.     Breath sounds: Normal breath sounds. No wheezing, rhonchi or rales.  Chest:      Chest wall: No tenderness.  Abdominal:     General: Bowel sounds are normal. There is no distension.     Palpations: Abdomen is soft. There is no mass.     Tenderness: There is no abdominal tenderness. There is no right CVA tenderness, left CVA tenderness, guarding or rebound.  Musculoskeletal:        General: No swelling or tenderness. Normal range of motion.     Cervical back: Normal range of motion. No rigidity or tenderness.     Right lower leg: No edema.     Left lower leg: No edema.  Lymphadenopathy:     Cervical: No cervical adenopathy.  Skin:    General: Skin is warm and dry.     Coloration: Skin is not pale.     Findings: No bruising, erythema, lesion or rash.  Neurological:     Mental Status: She is alert and oriented to person, place, and time.     Cranial Nerves: No cranial nerve deficit.     Sensory: No sensory deficit.     Motor: No weakness.     Coordination: Coordination normal.     Gait: Gait normal.  Psychiatric:        Mood and Affect: Mood normal.        Speech: Speech normal.        Behavior: Behavior normal.        Thought Content: Thought content normal.        Judgment: Judgment normal.     Labs reviewed: Recent Labs    08/30/22 0802 09/25/22 0829 10/12/22 1218  NA 134* 133* 133*  K 4.2 4.2 3.3*  CL 100 101 100  CO2 24 22 23   GLUCOSE 304* 323* 372*  BUN <5* 8 5*  CREATININE 0.69 0.71 0.75  CALCIUM 8.6* 8.9 8.9   Recent Labs    08/20/22 1632 09/25/22 0829  AST 13* 14  ALT 13 10  ALKPHOS 91  --   BILITOT <0.1* 0.4  PROT 6.9 7.0  ALBUMIN 3.6  --    Recent Labs    08/30/22 0802 09/25/22 0829 10/12/22 1218  WBC 4.1 4.6 5.8  NEUTROABS 2.0 2,001 3.4  HGB 8.6* 6.9*  7.0*  HCT 27.8* 24.1* 23.9*  MCV 65.6* 62.9* 60.8*  PLT 332 283 329   Lab Results  Component Value Date   TSH 1.13 09/25/2022   Lab Results  Component Value Date   HGBA1C >14.0 (H) 09/25/2022   Lab Results  Component Value Date   CHOL 185 09/25/2022   HDL  53 09/25/2022   LDLCALC 114 (H) 09/25/2022   TRIG 82 09/25/2022   CHOLHDL 3.5 09/25/2022    Significant Diagnostic Results in last 30 days:  No results found.  Assessment/Plan 1. DM type 2 with diabetic peripheral neuropathy (HCC) Lab Results  Component Value Date   HGBA1C >14.0 (H) 09/25/2022  -Ozempic ordered in previous visit but was unable to afford since insurance did not cover. -Has not been taking her insulin NovoLog and Lantus as prescribed -Advised to administer NovoLog 3 times daily with meals if blood sugars are greater than 150. 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   Family/ staff Communication: Reviewed plan of care with patient verbalized understanding  Labs/tests ordered: None   Next Appointment: Return if symptoms worsen or fail to improve.   Caesar Bookman, NP

## 2023-01-18 NOTE — Assessment & Plan Note (Signed)
No flareup .

## 2023-01-25 ENCOUNTER — Telehealth: Payer: Self-pay | Admitting: Family

## 2023-01-25 NOTE — Telephone Encounter (Signed)
Caller name: LAMA NARAYANAN  On DPR?: Yes  Call back number: [phone] (mobile)  Provider they see: Caesar Bookman, NP  Reason for call:  Calling Erica back - Alcario Drought was left message in Teams.

## 2023-01-25 NOTE — Telephone Encounter (Signed)
Returned pt call, no answer had to LM

## 2023-02-03 NOTE — Progress Notes (Deleted)
Initial neurology clinic note  Reason for Evaluation: Consultation requested by Ngetich, Dinah C, NP for an opinion regarding left hand numbness and tingling and numbness of feet. My final recommendations will be communicated back to the requesting physician by way of shared medical record or letter to requesting physician via [place] mail.  HPI: This is Ms. [name], a 37 y.o. ***-handed female with a medical history of DM, HTN, HLD, sickle cell trait, fibroids, asthma, vit D deficiency*** who presents to neurology clinic with the chief complaint of left hand numbness and tingling and numbness of feet. The patient is accompanied by ***.  *** Left hand numbness, tingling Also numbness in feet  Vit D?*** On zoloft for depression?***  The patient has not*** had similar episodes of symptoms in the past. ***  Muscle bulk loss? *** Muscle pain? ***  Cramps/Twitching? *** Suggestion of myotonia/difficulty relaxing after contraction? ***  Fatigable weakness?*** Does strength improve after brief exercise?***  Able to brush hair/teeth without difficulty? *** Able to button shirts/use zips? *** Clumsiness/dropping grasped objects?*** Can you arise from squatted position easily? *** Able to get out of chair without using arms? *** Able to walk up steps easily? *** Use an assistive device to walk? *** Significant imbalance with walking? *** Falls?*** Any change in urine color, especially after exertion/physical activity? ***  The patient denies*** symptoms suggestive of oculobulbar weakness including diplopia, ptosis, dysphagia, poor saliva control, dysarthria/dysphonia, impaired mastication, facial weakness/droop.  There are no*** neuromuscular respiratory weakness symptoms, particularly orthopnea>dyspnea.   Pseudobulbar affect is absent***.  The patient does not*** report symptoms referable to autonomic dysfunction including impaired sweating, heat or cold intolerance, excessive  mucosal dryness, gastroparetic early satiety, postprandial abdominal bloating, constipation, bowel or bladder dyscontrol, erectile dysfunction*** or syncope/presyncope/orthostatic intolerance.  There are no*** complaints relating to other symptoms of small fiber modalities including paresthesia/pain.  The patient has not *** noticed any recent skin rashes nor does he*** report any constitutional symptoms like fever, night sweats, anorexia or unintentional weight loss.  EtOH use: ***  Restrictive diet? *** Family history of neuropathy/myopathy/NM disease?***  Previous labs, electrodiagnostics, and neuroimaging are summarized below, but pertinent findings include***  Any biopsy done? *** Current medications being tried for the patient's symptoms include ***  Prior medications that have been tried: ***   MEDICATIONS:  Outpatient Encounter Medications as of 02/11/2023  Medication Sig   albuterol (VENTOLIN HFA) 108 (90 Base) MCG/ACT inhaler Inhale 2 puffs into the lungs every 6 (six) hours as needed for wheezing or shortness of breath.   atorvastatin (LIPITOR) 20 MG tablet Take 1 tablet (20 mg total) by mouth daily.   Continuous Blood Gluc Receiver (FREESTYLE LIBRE 3 READER) DEVI 1 Device by Does not apply route in the morning, at noon, in the evening, and at bedtime.   Continuous Glucose Sensor (FREESTYLE LIBRE 3 SENSOR) MISC Use as directed every 14 days   cyclobenzaprine (FLEXERIL) 10 MG tablet Take 1 tablet (10 mg total) by mouth as needed for muscle spasms.   ferrous sulfate 325 (65 FE) MG EC tablet Take 1 tablet (325 mg total) by mouth 2 (two) times daily with a meal.   Insulin Aspart FlexPen (NOVOLOG) 100 UNIT/ML INJECT 15 UNITS INTO THE SKIN THREE TIMES DAILY WITH MEALS for blood sugars > 150   Insulin Glargine (BASAGLAR KWIKPEN) 100 UNIT/ML Inject 24 Units into the skin at bedtime.   medroxyPROGESTERone (PROVERA) 5 MG tablet Take 2 tablets (10 mg total) by mouth daily.  Prenatal  28-0.8 MG TABS Take 1 tablet by mouth daily.   sertraline (ZOLOFT) 50 MG tablet Take 1 tablet (50 mg total) by mouth daily.   Vitamin D, Ergocalciferol, (DRISDOL) 1.25 MG (50000 UNIT) CAPS capsule Take 1 capsule (50,000 Units total) by mouth every 7 (seven) days.   [DISCONTINUED] metFORMIN (GLUCOPHAGE) 1000 MG tablet Take 1 tablet (1,000 mg total) by mouth 2 (two) times daily with a meal.   No facility-administered encounter medications on file as of 02/11/2023.    PAST MEDICAL HISTORY: Past Medical History:  Diagnosis Date   Asthma    COVID-19 07/04/2020   05/04/20   Diabetes mellitus without complication (HCC)    DKA (diabetic ketoacidoses) 08/27/2018   Family history of diabetes mellitus in mother 01/10/2018   Fibroid    pedunculated posterior RUQ near fundus. approx 3-4cm at time of 01/2016 c-section   GBS (group B streptococcus) infection    Hearing loss    pt states she was tested in high school and told it was selective she says she continues to have a deficit and does not feel it is selective   Herpes    Herpes simplex vulvovaginitis 09/06/2020   Hypertension    Newly diagnosed diabetes (HCC) 01/10/2018   Diagnosed 01/10/18  A1c 7.5%   Sickle cell trait (HCC) 09/01/2014   [ ]  ucx q trimester   Trichomonas vaginitis     PAST SURGICAL HISTORY: Past Surgical History:  Procedure Laterality Date   CESAREAN SECTION N/A 01/08/2016   Procedure: CESAREAN SECTION;  Surgeon: Trenton Bing, MD;  Location: [facility] BIRTHING SUITES;  Service: Obstetrics;  Laterality: N/A;   CESAREAN SECTION N/A 09/10/2020   Procedure: CESAREAN SECTION;  Surgeon: Levie Heritage, DO;  Location: [facility] LD ORS;  Service: Obstetrics;  Laterality: N/A;   DILATION AND CURETTAGE OF UTERUS     TUBAL LIGATION  09/10/2020   Procedure: BILATERAL TUBAL LIGATION;  Surgeon: Levie Heritage, DO;  Location: [facility] LD OR[facility];  Service: Obstetrics;;   WISDOM TOOTH EXTRACTION      ALLERGIES: No Known Allergies  FAMILY HISTORY: Family  History  Problem Relation Age of Onset   Diabetes Mother    Hypertension Mother    Hyperlipidemia Mother    Heart disease Mother    Breast cancer Mother        110   Ovarian cancer Mother        Unknown age   Diabetes Sister    Diabetes Brother    Breast cancer Cousin    Heart disease Maternal Grandmother     SOCIAL HISTORY: Social History   Tobacco Use   Smoking status: Former    Types: Cigarettes   Smokeless tobacco: Never   Tobacco comments:    quit 2013  Vaping Use   Vaping Use: Never used  Substance Use Topics   Alcohol use: No   Drug use: No   Social History   Social History Narrative   Not on file     OBJECTIVE: PHYSICAL EXAM: There were no vitals taken for this visit.  General:*** General appearance: Awake and alert. No distress. Cooperative with exam.  Skin: No obvious rash or jaundice. HEENT: Atraumatic. Anicteric. Lungs: Non-labored breathing on room air  Heart: Regular Abdomen: Soft, non tender. Extremities: No edema. No obvious deformity.  Musculoskeletal: No obvious joint swelling. Psych: Affect appropriate.  Neurological: Mental Status: Alert. Speech fluent. No pseudobulbar affect Cranial Nerves: CNII: No RAPD. Visual fields grossly intact. CNIII, IV, VI: PERRL.  No nystagmus. EOMI. CN V: Facial sensation intact bilaterally to fine touch. [place] clench strong. Jaw jerk***. CN VII: Facial muscles symmetric and strong. No ptosis at rest or after sustained upgaze***. CN VIII: Hearing grossly intact bilaterally. CN IX: No hypophonia. CN X: Palate elevates symmetrically. CN XI: Full strength shoulder shrug bilaterally. CN XII: Tongue protrusion full and midline. No atrophy or fasciculations. No significant dysarthria*** Motor: Tone is ***. *** fasciculations in *** extremities. *** atrophy. No grip or percussive myotonia.***  Individual muscle group testing (MRC grade out of 5):  Movement     Neck flexion ***    Neck extension ***      Right Left   Shoulder abduction *** ***   Shoulder adduction *** ***   Shoulder ext rotation *** ***   Shoulder int rotation *** ***   Elbow flexion *** ***   Elbow extension *** ***   Wrist extension *** ***   Wrist flexion *** ***   Finger abduction - FDI *** ***   Finger abduction - ADM *** ***   Finger extension *** ***   Finger distal flexion - 2/3 *** ***   Finger distal flexion - 4/5 *** ***   Thumb flexion - FPL *** ***   Thumb abduction - APB *** ***    Hip flexion *** ***   Hip extension *** ***   Hip adduction *** ***   Hip abduction *** ***   Knee extension *** ***   Knee flexion *** ***   Dorsiflexion *** ***   Plantarflexion *** ***   Inversion *** ***   Eversion *** ***   Great toe extension *** ***   Great toe flexion *** ***     Reflexes:  Right Left   Bicep *** ***   Tricep *** ***   BrRad *** ***   Knee *** ***   Ankle *** ***    Pathological Reflexes: Babinski: *** response bilaterally*** Hoffman: *** Troemner: *** Pectoral: *** Palmomental: *** Facial: *** Midline tap: *** Sensation: Pinprick: *** Vibration: *** Temperature: *** Proprioception: *** Coordination: Intact finger-to- nose-finger bilaterally. Romberg negative.*** Gait: Able to rise from chair with arms crossed unassisted. Normal, narrow-based gait. Able to tandem walk. Able to walk on toes and heels.***  Lab and Test Review: Internal labs: 10/12/22: BMP significant for Na of 133, K 3.3, glucose 372 CBC significant for Hb of 7.0 (chronic), MCV 60.8  Lipid panel: Component     Latest Ref Rng 09/25/2022  Cholesterol     <200 mg/dL 098   HDL Cholesterol     > OR = 50 mg/dL 53   Triglycerides     <150 mg/dL 82   LDL Cholesterol (Calc)     mg/dL (calc) 119 (H)   Total CHOL/HDL Ratio     <5.0 (calc) 3.5   Non-HDL Cholesterol (Calc)     <130 mg/dL (calc) 147 (H)    04/01/55: TSH: 1.13 HbA1c: > 14.0  Vit D (11/28/20): 9.3  External  labs: ***  Imaging: ***  ASSESSMENT: [name] is a 37 y.o. female who presents for evaluation of ***. *** has a relevant medical history of ***. *** neurological examination is pertinent for ***. Available diagnostic data is significant for ***. This constellation of symptoms and objective data would most likely localize to ***. ***  PLAN: -Blood work: ***B12, IFE *** -Vitamin D 1000 international units (IU) daily   -Return to clinic ***  The impression above as well as  the plan as outlined below were extensively discussed with the patient (in the company of ***) who voiced understanding. All questions were answered to their satisfaction.  The patient was counseled on pertinent fall precautions per the printed material provided today, and as noted under the "Patient Instructions" section below.***  When available, results of the above investigations and possible further recommendations will be communicated to the patient via telephone/MyChart. Patient to call office if not contacted after expected testing turnaround time.   Total time spent reviewing records, interview, history/exam, documentation, and coordination of care on day of encounter:  *** min   Thank you for allowing me to participate in patient's care.  If I can answer any additional questions, I would be pleased to do so.  Jacquelyne Balint, MD   CC: Ngetich, Donalee Citrin, NP [address] [place] [address]  CC: Referring provider: Caesar Bookman, NP [address] [place],  [place] [address]

## 2023-02-08 ENCOUNTER — Other Ambulatory Visit: Payer: Self-pay

## 2023-02-10 ENCOUNTER — Ambulatory Visit (HOSPITAL_COMMUNITY): Payer: Commercial Managed Care - HMO | Admitting: Physician Assistant

## 2023-02-10 DIAGNOSIS — F419 Anxiety disorder, unspecified: Secondary | ICD-10-CM | POA: Diagnosis not present

## 2023-02-10 DIAGNOSIS — F331 Major depressive disorder, recurrent, moderate: Secondary | ICD-10-CM | POA: Diagnosis not present

## 2023-02-10 MED ORDER — SERTRALINE HCL 50 MG PO TABS: 50.0000 mg | ORAL_TABLET | Freq: Every day | ORAL | 2 refills | Status: DC

## 2023-02-10 NOTE — Progress Notes (Signed)
BH MD/PA/NP OP Progress Note  02/11/2023 6:55 PM [name]  MRN:  [id number]  Chief Complaint:  Chief Complaint  Patient presents with   Follow-up   Medication Refill   HPI:   [name] is a 37 year old, African-American female with a past psychiatric history significant for major depressive disorder and anxiety who presents to [facility] for follow-up and medication management.  Patient is currently being managed on the following medication: Sertraline 50 mg daily.  Patient reports no issues or concerns regarding her current medication regimen.  Patient endorses depressive symptoms related to stressors at home.  Patient reports that when her depression is present, she rates it an 8 out of 10 with 10 being most severe.  Patient endorses depressive episodes 2 days out of the week.  Patient endorses the following depressive symptoms: feelings of sadness, lack of motivation, increased appetite, and over drinking (water).  As mentioned before, patient main stressor revolves around her current place of residence.  Patient reports that she is ready to move from her apartment complex due to the dangers of living there.  In addition to her depression, patient also endorses anxiety.  Patient reports that her anxiety is also attributed to her current place of residence.  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  Patient states that she is currently trying to get her stepson to move out of the house to be more self-sufficient.  A PHQ-9 screen was performed with the patient scoring a 10.  A GAD-7 screen was also performed with the patient scored a 17.  Patient is alert and oriented x 4, calm, cooperative, and fully engaged in conversation during the encounter.  Patient reports that she currently feels groggy but endorses okay mood.  Patient denies  suicidal or homicidal ideations.  She further denies auditory or visual hallucinations and does not appear to be responding to internal/external stimuli.  Patient endorses good sleep and receives on average 7 hours of sleep per night.  Patient endorses good appetite and eats on average 3 meals per day along with snacking in between.  Patient endorses alcohol consumption sparingly.  Patient denies tobacco use and illicit drug use.  Visit Diagnosis:    ICD-10-CM   1. Moderate episode of recurrent major depressive disorder (HCC)  F33.1 sertraline (ZOLOFT) 50 MG tablet    2. Anxiety disorder, unspecified type  F41.9 sertraline (ZOLOFT) 50 MG tablet      Past Psychiatric History:  Patient endorses a past history depression and anxiety   Past Medical History:  Past Medical History:  Diagnosis Date   Asthma    COVID-19 07/04/2020   05/04/20   Diabetes mellitus without complication (HCC)    DKA (diabetic ketoacidoses) 08/27/2018   Family history of diabetes mellitus in mother 01/10/2018   Fibroid    pedunculated posterior RUQ near fundus. approx 3-4cm at time of 01/2016 c-section   GBS (group B streptococcus) infection    Hearing loss    pt states she was tested in high school and told it was selective she says she continues to have a deficit and does not feel it is selective   Herpes    Herpes simplex vulvovaginitis 09/06/2020   Hypertension    Newly diagnosed diabetes (HCC) 01/10/2018   Diagnosed 01/10/18  A1c 7.5%   Sickle cell trait (HCC) 09/01/2014   [ ]  ucx q trimester  Trichomonas vaginitis     Past Surgical History:  Procedure Laterality Date   CESAREAN SECTION N/A 01/08/2016   Procedure: CESAREAN SECTION;  Surgeon: K[place]man [place] Bing, MD;  Location: [facility] BIRTHING SU[facility];  Service: Obstetrics;  Laterality: N/A;   CESAREAN SECTION N/A 09/10/2020   Procedure: CESAREAN SECTION;  Surgeon: Levie Heritage, DO;  Location: [facility] LD ORS;  Service: Obstetrics;  Laterality: N/A;   DILATION AND CURETTAGE  OF UTERUS     TUBAL LIGATION  09/10/2020   Procedure: BILATERAL TUBAL LIGATION;  Surgeon: Levie Heritage, DO;  Location: [facility] LD ORS;  Service: Obstetrics;;   WISDOM TOOTH EXTRACTION      Family Psychiatric History:  Mother - patient reports that her mother states that she has "everything in the book" in regards to her mental health Sister - history of suicide and depression Brother (deceased) - depression   Family history of suicide attempt: Patient reports that her mother and sister have attempted suicide Family history of homicide attempt: Patient denies a family history of homicide attempts Family history of substance abuse: Patient reports that her mother abused drugs in the past  Family History:  Family History  Problem Relation Age of Onset   Diabetes Mother    Hypertension Mother    Hyperlipidemia Mother    Heart disease Mother    Breast cancer Mother        76   Ovarian cancer Mother        Unknown age   Diabetes Sister    Diabetes Brother    Breast cancer Cousin    Heart disease Maternal Grandmother     Social History:  Social History   Socioeconomic History   Marital status: Married    Spouse name: Not on file   Number of children: Not on file   Years of education: Not on file   Highest education level: Not on file  Occupational History   Not on file  Tobacco Use   Smoking status: Former    Types: Cigarettes   Smokeless tobacco: Never   Tobacco comments:    quit 2013  Vaping Use   Vaping status: Never Used  Substance and Sexual Activity   Alcohol use: No   Drug use: No   Sexual activity: Yes    Birth control/protection: None  Other Topics Concern   Not on file  Social History Narrative   Not on file   Social Determinants of Health   Financial Resource Strain: Low Risk  (01/10/2018)   Overall Financial Resource Strain (CARDIA)    Difficulty of Paying Living Expenses: Not very hard  Food Insecurity: Food Insecurity Present (10/21/2022)   Hunger  Vital Sign    Worried About Running Out of Food in the Last Year: Often true    Ran Out of Food in the Last Year: Often true  Transportation Needs: Unmet Transportation Needs (10/21/2022)   PRAPARE - Transportation    Lack of Transportation (Medical): No    Lack of Transportation (Non-Medical): Yes  Physical Activity: Insufficiently Active (01/10/2018)   Exercise Vital Sign    Days of Exercise per Week: 1 day    Minutes of Exercise per Session: 10 min  Stress: Stress Concern Present (01/10/2018)   [employer] of Occupational Health - Occupational Stress Questionnaire    Feeling of Stress : Very much  Social Connections: Moderately Isolated (01/10/2018)   Social Connection and Isolation Panel [NHANES]    Frequency of Communication with Friends and Family: Three times  a week    Frequency of Social Gatherings with Friends and Family: Once a week    Attends Religious Services: Never    Database administrator or Organizations: No    Attends Banker Meetings: Never    Marital Status: Separated    Allergies: No Known Allergies  Metabolic Disorder Labs: Lab Results  Component Value Date   HGBA1C >14.0 (H) 09/25/2022   MPG  09/25/2022     Comment:     eAG cannot be calculated. Hemoglobin A1c result exceeds the linearity of the assay. . HbA1c performed on Roche platform. Effective 05/11/22 a change in test platforms may have  shifted HbA1c results compared to historical results.    MPG 162.81 09/10/2020   No results found for: "PROLACTIN" Lab Results  Component Value Date   CHOL 185 09/25/2022   TRIG 82 09/25/2022   HDL 53 09/25/2022   CHOLHDL 3.5 09/25/2022   LDLCALC 114 (H) 09/25/2022   LDLCALC 138 (H) 12/05/2020   Lab Results  Component Value Date   TSH 1.13 09/25/2022   TSH 1.350 11/28/2020    Therapeutic Level Labs: No results found for: "LITHIUM" No results found for: "VALPROATE" No results found for: "CBMZ"  Current Medications: Current  Outpatient Medications  Medication Sig Dispense Refill   albuterol (VENTOLIN HFA) 108 (90 Base) MCG/ACT inhaler Inhale 2 puffs into the lungs every 6 (six) hours as needed for wheezing or shortness of breath. 1 each 1   atorvastatin (LIPITOR) 20 MG tablet Take 1 tablet (20 mg total) by mouth daily. 90 tablet 1   Continuous Blood Gluc Receiver (FREESTYLE LIBRE 3 READER) DEVI 1 Device by Does not apply route in the morning, at noon, in the evening, and at bedtime. 1 each 5   Continuous Glucose Sensor (FREESTYLE LIBRE 3 SENSOR) MISC Use as directed every 14 days 8 each 0   cyclobenzaprine (FLEXERIL) 10 MG tablet Take 1 tablet (10 mg total) by mouth as needed for muscle spasms. 30 tablet 1   ferrous sulfate 325 (65 FE) MG EC tablet Take 1 tablet (325 mg total) by mouth 2 (two) times daily with a meal. 90 tablet 1   Insulin Aspart FlexPen (NOVOLOG) 100 UNIT/ML INJECT 15 UNITS INTO THE SKIN THREE TIMES DAILY WITH MEALS for blood sugars > 150 15 mL 2   Insulin Glargine (BASAGLAR KWIKPEN) 100 UNIT/ML Inject 24 Units into the skin at bedtime. 9 mL 3   medroxyPROGESTERone (PROVERA) 5 MG tablet Take 2 tablets (10 mg total) by mouth daily. 60 tablet 1   Prenatal 28-0.8 MG TABS Take 1 tablet by mouth daily. 30 tablet 12   sertraline (ZOLOFT) 50 MG tablet Take 1 tablet (50 mg total) by mouth daily. 30 tablet 2   Vitamin D, Ergocalciferol, (DRISDOL) 1.25 MG (50000 UNIT) CAPS capsule Take 1 capsule (50,000 Units total) by mouth every 7 (seven) days. 4 capsule 2   No current facility-administered medications for this visit.     Musculoskeletal: Strength & Muscle Tone: within normal limits Gait & Station: normal Patient leans: N/A  Psychiatric Specialty Exam: Review of Systems  Psychiatric/Behavioral:  Negative for decreased concentration, dysphoric mood, hallucinations, self-injury, sleep disturbance and suicidal ideas. The patient is nervous/anxious. The patient is not hyperactive.     Blood pressure  115/76, pulse 93, temperature 98.4 F (36.9 C), temperature source Oral, height 5\' 8"  (1.727 m), weight 231 lb (104.8 kg), SpO2 100%.Body mass index is 35.12 kg/m.  General Appearance: Casual  Eye Contact:  Good  Speech:  Clear and Coherent and Normal Rate  Volume:  Normal  Mood:  Anxious and Depressed  Affect:  Appropriate  Thought Process:  Coherent and Descriptions of Associations: Intact  Orientation:  Full (Time, Place, and Person)  Thought Content: WDL   Suicidal Thoughts:  No  Homicidal Thoughts:  No  Memory:  Immediate;   Good Recent;   Good Remote;   Good  Judgement:  Good  Insight:  Good  Psychomotor Activity:  Normal  Concentration:  Concentration: Good and Attention Span: Good  Recall:  Good  Fund of Knowledge: Good  Language: Good  Akathisia:  No  Handed:  Right  AIMS (if indicated): not done  Assets:  Communication Skills Desire for Improvement Social Support  ADL's:  Intact  Cognition: WNL  Sleep:  Good   Screenings: GAD-7    Flowsheet Row Clinical Support from 02/10/2023 in [facility] Clinical Support from 12/08/2022 in [facility] Office Visit from 10/21/2022 in [facility] Clinical Support from 10/13/2022 in [facility] Office Visit from 09/09/2022 in [facility]  Total GAD-7 Score 17 12 19 19 20       PHQ2-9    Flowsheet Row Clinical Support from 02/10/2023 in [facility] Clinical Support from 12/08/2022 in [facility] Office Visit from 10/21/2022 in [facility] Clinical Support from 10/13/2022 in [facility] Office Visit from 09/09/2022 in [facility]  PHQ-2 Total Score 2 0 5 5 6   PHQ-9  Total Score 10 -- 18 15 22       Flowsheet Row Clinical Support from 02/10/2023 in [facility] Clinical Support from 12/08/2022 in [facility] Clinical Support from 10/13/2022 in [facility]  C-SSRS RISK CATEGORY No Risk No Risk No Risk        Assessment and Plan:   Rebekah Zackery. Mayford [name] is a 37 year old, African-American female with a past psychiatric history significant for major depressive disorder and anxiety who presents to [facility] for follow-up and medication management.  Patient presents to the encounter endorsing depressive symptoms and anxiety mainly attributed to the area in which she and her family currently live.  She reports that she is ready to move from her current place of residence due to the dangers of living at her apartment complex.  Patient continues to take her sertraline as prescribed and denies experiencing any issues or concerns at this time.  Patient denies the need for dosage adjustments at this time and will continue to take her sertraline as prescribed.  Patient endorses stability on her current medication regimen.  Patient's medication to be e-prescribed to pharmacy of choice.  Collaboration of Care: Collaboration of Care: Medication Management AEB provider managing patient's psychiatric medications, Psychiatrist AEB patient being seen by mental health provider at this facility, and Referral or follow-up with counselor/therapist AEB patient being seen by a licensed clinical social worker at this facility  Patient/Guardian was advised Release of Information must be obtained prior to any record release in order to collaborate their care with an outside provider. Patient/Guardian was advised if they have not already done so to contact the registration department to sign  all necessary forms in order for [place] to release information regarding their care.    Consent: Patient/Guardian gives verbal consent for treatment and assignment of benefits for services provided during this visit. Patient/Guardian expressed understanding and agreed to proceed.   1. Moderate episode of recurrent major depressive disorder (HCC)  - sertraline (ZOLOFT) 50 MG tablet; Take 1 tablet (50 mg total) by mouth daily.  Dispense: 30 tablet; Refill: 2  2. Anxiety disorder, unspecified type  - sertraline (ZOLOFT) 50 MG tablet; Take 1 tablet (50 mg total) by mouth daily.  Dispense: 30 tablet; Refill: 2  Patient to follow-up in 2 months Provider spent a total of 18 minutes with the patient/reviewing patient's chart  Meta Hatchet, PA 02/11/2023, 6:55 PM

## 2023-02-11 ENCOUNTER — Other Ambulatory Visit: Payer: Self-pay | Admitting: Family

## 2023-02-11 ENCOUNTER — Encounter (HOSPITAL_COMMUNITY): Payer: Self-pay | Admitting: Physician Assistant

## 2023-02-11 ENCOUNTER — Ambulatory Visit: Payer: Commercial Managed Care - HMO | Admitting: Neurology

## 2023-02-11 ENCOUNTER — Encounter: Payer: Self-pay | Admitting: Neurology

## 2023-02-11 DIAGNOSIS — J4520 Mild intermittent asthma, uncomplicated: Secondary | ICD-10-CM

## 2023-02-12 ENCOUNTER — Other Ambulatory Visit: Payer: Self-pay

## 2023-02-15 DIAGNOSIS — E611 Iron deficiency: Secondary | ICD-10-CM | POA: Insufficient documentation

## 2023-02-16 ENCOUNTER — Other Ambulatory Visit (HOSPITAL_COMMUNITY): Payer: Self-pay

## 2023-02-16 MED ORDER — FREESTYLE LIBRE 3 READER DEVI
1.0000 | Freq: Two times a day (BID) | 0 refills | Status: DC
  Filled 2023-02-16: qty 1, 30d supply, fill #0

## 2023-02-16 MED ORDER — AUM MINI INSULIN PEN NEEDLE 32G X 6 MM MISC
Freq: Three times a day (TID) | 11 refills | Status: DC
  Filled 2023-02-16 – 2023-03-18 (×2): qty 100, 33d supply, fill #0
  Filled 2023-04-20: qty 100, 33d supply, fill #1
  Filled 2023-06-08: qty 100, 33d supply, fill #2
  Filled 2023-07-12: qty 100, 33d supply, fill #3
  Filled 2023-08-18 – 2023-11-01 (×2): qty 100, 33d supply, fill #4
  Filled 2023-12-29: qty 100, 33d supply, fill #5

## 2023-02-16 MED ORDER — INSULIN ISOPHANE & REGULAR (HUMAN 70-30)100 UNIT/ML KWIKPEN
10.0000 [IU] | PEN_INJECTOR | Freq: Two times a day (BID) | SUBCUTANEOUS | 1 refills | Status: DC
  Filled 2023-02-16: qty 15, 75d supply, fill #0
  Filled 2023-03-18: qty 15, 75d supply, fill #1
  Filled 2023-04-19: qty 15, 50d supply, fill #1
  Filled 2023-04-19: qty 12, 60d supply, fill #1
  Filled 2023-04-19: qty 3, 15d supply, fill #1
  Filled 2023-05-04: qty 15, 75d supply, fill #1

## 2023-02-16 MED ORDER — INSULIN GLARGINE SOLOSTAR 100 UNIT/ML ~~LOC~~ SOPN
20.0000 [IU] | PEN_INJECTOR | Freq: Every day | SUBCUTANEOUS | 1 refills | Status: DC
  Filled 2023-02-16: qty 15, 75d supply, fill #0

## 2023-02-16 MED ORDER — OZEMPIC (0.25 OR 0.5 MG/DOSE) 2 MG/3ML ~~LOC~~ SOPN
0.2500 mg | PEN_INJECTOR | SUBCUTANEOUS | 1 refills | Status: DC
  Filled 2023-02-16: qty 3, 28d supply, fill #0

## 2023-02-17 ENCOUNTER — Other Ambulatory Visit (HOSPITAL_COMMUNITY): Payer: Self-pay

## 2023-02-17 ENCOUNTER — Other Ambulatory Visit: Payer: Self-pay

## 2023-02-17 DIAGNOSIS — D3500 Benign neoplasm of unspecified adrenal gland: Secondary | ICD-10-CM

## 2023-02-17 MED ORDER — TRULICITY 0.75 MG/0.5ML ~~LOC~~ SOAJ
0.7500 mg | SUBCUTANEOUS | 0 refills | Status: DC
  Filled 2023-02-17: qty 2, 28d supply, fill #0

## 2023-02-20 ENCOUNTER — Other Ambulatory Visit: Payer: Self-pay

## 2023-02-20 ENCOUNTER — Emergency Department (HOSPITAL_COMMUNITY)
Admission: EM | Admit: 2023-02-20 | Discharge: 2023-02-20 | Disposition: A | Discharge status: Home / Self Care | Payer: Commercial Managed Care - HMO | Attending: Emergency Medicine | Admitting: Emergency Medicine

## 2023-02-20 ENCOUNTER — Encounter (HOSPITAL_COMMUNITY): Payer: Self-pay

## 2023-02-20 ENCOUNTER — Emergency Department (HOSPITAL_COMMUNITY): Payer: Commercial Managed Care - HMO

## 2023-02-20 DIAGNOSIS — Z7984 Long term (current) use of oral hypoglycemic drugs: Secondary | ICD-10-CM | POA: Diagnosis not present

## 2023-02-20 DIAGNOSIS — E1165 Type 2 diabetes mellitus with hyperglycemia: Secondary | ICD-10-CM | POA: Diagnosis not present

## 2023-02-20 DIAGNOSIS — R739 Hyperglycemia, unspecified: Secondary | ICD-10-CM | POA: Diagnosis present

## 2023-02-20 DIAGNOSIS — D649 Anemia, unspecified: Secondary | ICD-10-CM

## 2023-02-20 DIAGNOSIS — Z794 Long term (current) use of insulin: Secondary | ICD-10-CM | POA: Diagnosis not present

## 2023-02-20 DIAGNOSIS — R5383 Other fatigue: Secondary | ICD-10-CM

## 2023-02-20 DIAGNOSIS — R0602 Shortness of breath: Secondary | ICD-10-CM | POA: Insufficient documentation

## 2023-02-20 LAB — CBC
HCT: 31.2 % — ABNORMAL LOW (ref 36.0–46.0)
Hemoglobin: 9.1 g/dL — ABNORMAL LOW (ref 12.0–15.0)
MCH: 18.8 pg — ABNORMAL LOW (ref 26.0–34.0)
MCHC: 29.2 g/dL — ABNORMAL LOW (ref 30.0–36.0)
MCV: 64.3 fL — ABNORMAL LOW (ref 80.0–100.0)
Platelets: 327 10*3/uL (ref 150–400)
RBC: 4.85 MIL/uL (ref 3.87–5.11)
RDW: 18.9 % — ABNORMAL HIGH (ref 11.5–15.5)
WBC: 5.2 10*3/uL (ref 4.0–10.5)
nRBC: 0 % (ref 0.0–0.2)

## 2023-02-20 LAB — URINALYSIS, ROUTINE W REFLEX MICROSCOPIC
Bacteria, UA: NONE SEEN
Bilirubin Urine: NEGATIVE
Glucose, UA: >=500 mg/dL — AB
Hgb urine dipstick: NEGATIVE
Ketones, ur: NEGATIVE mg/dL
Leukocytes,Ua: NEGATIVE
Nitrite: NEGATIVE
Protein, ur: NEGATIVE mg/dL
Specific Gravity, Urine: 1.022 (ref 1.005–1.030)
pH: 6 (ref 5.0–8.0)

## 2023-02-20 LAB — I-STAT VENOUS BLOOD GAS, ED
Acid-base deficit: 1 mmol/L (ref 0.0–2.0)
Bicarbonate: 24.5 mmol/L (ref 20.0–28.0)
Calcium, Ion: 1.19 mmol/L (ref 1.15–1.40)
HCT: 31 % — ABNORMAL LOW (ref 36.0–46.0)
Hemoglobin: 10.5 g/dL — ABNORMAL LOW (ref 12.0–15.0)
O2 Saturation: 49 %
Potassium: 4.6 mmol/L (ref 3.5–5.1)
Sodium: 131 mmol/L — ABNORMAL LOW (ref 135–145)
TCO2: 26 mmol/L (ref 22–32)
pCO2, Ven: 41 mmHg — ABNORMAL LOW (ref 44–60)
pH, Ven: 7.385 (ref 7.25–7.43)
pO2, Ven: 27 mmHg — CL (ref 32–45)

## 2023-02-20 LAB — BASIC METABOLIC PANEL
Anion gap: 10 (ref 5–15)
BUN: 9 mg/dL (ref 6–20)
CO2: 23 mmol/L (ref 22–32)
Calcium: 9.1 mg/dL (ref 8.9–10.3)
Chloride: 97 mmol/L — ABNORMAL LOW (ref 98–111)
Creatinine, Ser: 0.77 mg/dL (ref 0.44–1.00)
GFR, Estimated: >60 mL/min (ref >60)
Glucose, Bld: 572 mg/dL (ref 70–99)
Potassium: 4.7 mmol/L (ref 3.5–5.1)
Sodium: 130 mmol/L — ABNORMAL LOW (ref 135–145)

## 2023-02-20 LAB — DIFFERENTIAL
Abs Immature Granulocytes: 0 10*3/uL (ref 0.00–0.07)
Basophils Absolute: 0.2 10*3/uL — ABNORMAL HIGH (ref 0.0–0.1)
Basophils Relative: 4 %
Eosinophils Absolute: 0.1 10*3/uL (ref 0.0–0.5)
Eosinophils Relative: 1 %
Lymphocytes Relative: 33 %
Lymphs Abs: 1.7 10*3/uL (ref 0.7–4.0)
Monocytes Absolute: 0.1 10*3/uL (ref 0.1–1.0)
Monocytes Relative: 1 %
Neutro Abs: 3.2 10*3/uL (ref 1.7–7.7)
Neutrophils Relative %: 61 %
nRBC: 0 /100 WBC

## 2023-02-20 LAB — CBG MONITORING, ED
Glucose-Capillary: 357 mg/dL — ABNORMAL HIGH (ref 70–99)
Glucose-Capillary: 195 mg/dL — ABNORMAL HIGH (ref 70–99)

## 2023-02-20 LAB — TYPE AND SCREEN
ABO/RH(D): A POS
Antibody Screen: NEGATIVE

## 2023-02-20 LAB — RETICULOCYTES
Immature Retic Fract: 27 % — ABNORMAL HIGH (ref 2.3–15.9)
RBC.: 4.92 MIL/uL (ref 3.87–5.11)
Retic Count, Absolute: 49.7 10*3/uL (ref 19.0–186.0)
Retic Ct Pct: 1 % (ref 0.4–3.1)

## 2023-02-20 MED ORDER — LACTATED RINGERS IV BOLUS
2000.0000 mL | Freq: Once | INTRAVENOUS | Status: AC
  Administered 2023-02-20: 2000 mL via INTRAVENOUS

## 2023-02-20 MED ORDER — ACETAMINOPHEN 500 MG PO TABS: 1000.0000 mg | ORAL_TABLET | ORAL | Status: DC

## 2023-02-20 MED ORDER — INSULIN ASPART 100 UNIT/ML IJ SOLN
8.0000 [IU] | INTRAMUSCULAR | Status: AC
  Administered 2023-02-20: 8 [IU] via SUBCUTANEOUS

## 2023-02-20 NOTE — ED Provider Notes (Signed)
[place] EMERGENCY DEPARTMENT AT [facility] Provider Note   CSN: [id number] Arrival date & time: 02/20/23  1146     History  Chief Complaint  Patient presents with   Abnormal Lab   Shortness of Breath    [name] is a 37 y.o. female.  37 year old female with a history of anemia and insulin-dependent diabetes who presents emergency department with fatigue, shortness of breath, and dizziness.  Reports that her symptoms have been going on for several months.  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  Says that she had heavy vaginal bleeding from December to April of this year and was found to have an intrauterine mass that was removed.  Says that since then has been having normal menstrual cycles.  Last cycle was July 1 to 7.  No vaginal bleeding now.  No blood in her stools.  Talk to her primary doctor who felt that she may benefit from a transfusion.        Home Medications Prior to Admission medications   Medication Sig Start Date End Date Taking? Authorizing Provider  albuterol (VENTOLIN HFA) 108 (90 Base) MCG/ACT inhaler INHALE 2 PUFFS INTO THE LUNGS EVERY 6 HOURS AS NEEDED FOR WHEEZING OR SHORTNESS OF BREATH 02/12/23   Ngetich, Dinah C, NP  atorvastatin (LIPITOR) 20 MG tablet Take 1 tablet (20 mg total) by mouth daily. 09/15/22   Ngetich, Dinah C, NP  Continuous Blood Gluc Receiver (FREESTYLE LIBRE 3 READER) DEVI 1 Device by Does not apply route in the morning, at noon, in the evening, and at bedtime. 09/15/22   Ngetich, Dinah C, NP  Continuous Glucose Receiver (FREESTYLE LIBRE 3 READER) DEVI Use 1 device as directed 2 (two) times daily 02/16/23     Continuous Glucose Sensor (FREESTYLE LIBRE 3 SENSOR) MISC Use as directed every 14 days 10/12/22   Ngetich, Dinah C, NP  cyclobenzaprine (FLEXERIL) 10 MG tablet TAKE 1 TABLET( 10 MG TOTAL) BY MOUTH DAILY AS NEEDED FOR MUSCLE SPASMS 02/12/23    Ngetich, Dinah C, NP  Dulaglutide (TRULICITY) 0.75 MG/0.5ML SOPN Inject 0.75 mg into the skin once a week. 02/17/23     ferrous sulfate 325 (65 FE) MG EC tablet Take 1 tablet (325 mg total) by mouth 2 (two) times daily with a meal. 09/15/22   Ngetich, Dinah C, NP  Insulin Aspart FlexPen (NOVOLOG) 100 UNIT/ML INJECT 15 UNITS INTO THE SKIN THREE TIMES DAILY WITH MEALS for blood sugars > 150 01/18/23   Ngetich, Dinah C, NP  Insulin Glargine (BASAGLAR KWIKPEN) 100 UNIT/ML Inject 24 Units into the skin at bedtime. 01/18/23   Ngetich, Dinah C, NP  Insulin Glargine Solostar (LANTUS) 100 UNIT/ML Solostar Pen Inject 20 Units into the skin at bedtime. 02/16/23     insulin isophane & regular human KwikPen (HUMULIN 70/30 MIX) (70-30) 100 UNIT/ML KwikPen Inject 10 Units into the skin 2 (two) times daily. 02/16/23   Donato Schultz, FNP  Insulin Pen Needle (AUM MINI INSULIN PEN NEEDLE) 32G X 6 MM MISC Use with Lantus nightly and NPH twice daily 02/16/23     medroxyPROGESTERone (PROVERA) 5 MG tablet Take 2 tablets (10 mg total) by mouth daily. 10/21/22   Adam Phenix, MD  Prenatal 28-0.8 MG TABS Take 1 tablet by mouth daily. 09/09/22   Adam Phenix, MD  sertraline (ZOLOFT) 50 MG tablet Take 1 tablet (50 mg total) by mouth daily. 02/10/23  02/10/24  Nwoko, Tommas Olp, PA  Vitamin D, Ergocalciferol, (DRISDOL) 1.25 MG (50000 UNIT) CAPS capsule Take 1 capsule (50,000 Units total) by mouth every 7 (seven) days. 09/15/22   Ngetich, Dinah C, NP  metFORMIN (GLUCOPHAGE) 1000 MG tablet Take 1 tablet (1,000 mg total) by mouth 2 (two) times daily with a meal. 09/13/20 10/14/20  Reva Bores, MD      Allergies    Patient has no known allergies.    Review of Systems   Review of Systems  Physical Exam Updated Vital Signs BP 121/66   Pulse 78   Temp 98.3 F (36.8 C)   Resp (!) 21   Ht 5\' 8"  (1.727 m)   Wt 108 kg   SpO2 100%   BMI 36.19 kg/m  Physical Exam Vitals and nursing note reviewed.  Constitutional:      General:  She is not in acute distress.    Appearance: She is well-developed.  HENT:     Head: Normocephalic and atraumatic.     Right Ear: External ear normal.     Left Ear: External ear normal.     Nose: Nose normal.  Eyes:     Extraocular Movements: Extraocular movements intact.     Conjunctiva/sclera: Conjunctivae normal.     Pupils: Pupils are equal, round, and reactive to light.  Cardiovascular:     Rate and Rhythm: Normal rate and regular rhythm.     Heart sounds: No murmur heard. Pulmonary:     Effort: Pulmonary effort is normal. No respiratory distress.     Breath sounds: Normal breath sounds.  Abdominal:     General: Abdomen is flat. There is no distension.     Palpations: Abdomen is soft. There is no mass.     Tenderness: There is no abdominal tenderness. There is no guarding.  Musculoskeletal:     Cervical back: Normal range of motion and neck supple.     Right lower leg: No edema.     Left lower leg: No edema.  Skin:    General: Skin is warm and dry.  Neurological:     Mental Status: She is alert and oriented to person, place, and time. Mental status is at baseline.  Psychiatric:        Mood and Affect: Mood normal.     ED Results / Procedures / Treatments   Labs (all labs ordered are listed, but only abnormal results are displayed) Labs Reviewed  CBC - Abnormal; Notable for the following components:      Result Value   Hemoglobin 9.1 (*)    HCT 31.2 (*)    MCV 64.3 (*)    MCH 18.8 (*)    MCHC 29.2 (*)    RDW 18.9 (*)    All other components within normal limits  BASIC METABOLIC PANEL - Abnormal; Notable for the following components:   Sodium 130 (*)    Chloride 97 (*)    Glucose, Bld 572 (*)    All other components within normal limits  DIFFERENTIAL - Abnormal; Notable for the following components:   Basophils Absolute 0.2 (*)    All other components within normal limits  RETICULOCYTES - Abnormal; Notable for the following components:   Immature Retic Fract  27.0 (*)    All other components within normal limits  URINALYSIS, ROUTINE W REFLEX MICROSCOPIC - Abnormal; Notable for the following components:   Color, Urine STRAW (*)    Glucose, UA >=500 (*)    All  other components within normal limits  CBG MONITORING, ED - Abnormal; Notable for the following components:   Glucose-Capillary 357 (*)    All other components within normal limits  I-STAT VENOUS BLOOD GAS, ED - Abnormal; Notable for the following components:   pCO2, Ven 41.0 (*)    pO2, Ven 27 (*)    Sodium 131 (*)    HCT 31.0 (*)    Hemoglobin 10.5 (*)    All other components within normal limits  CBG MONITORING, ED  TYPE AND SCREEN    EKG EKG Interpretation Date/Time:  Saturday February 20 2023 12:40:04 EDT Ventricular Rate:  86 PR Interval:  161 QRS Duration:  79 QT Interval:  347 QTC Calculation: 415 R Axis:   52  Text Interpretation: Sinus rhythm Low voltage, precordial leads Confirmed by Vonita Moss [id number] on 02/20/2023 12:50:58 PM  Radiology DG Chest 2 View  Result Date: 02/20/2023 CLINICAL DATA:  Shortness of breath. EXAM: CHEST - 2 VIEW COMPARISON:  08/20/2022 FINDINGS: Heart size and mediastinal contours appear normal. There is no pleural fluid, interstitial edema or airspace disease. Osseous structures are unremarkable. IMPRESSION: No acute cardiopulmonary abnormalities. Electronically Signed   By: Signa Kell M.D.   On: 02/20/2023 13:25    Procedures Procedures    Medications Ordered in ED Medications  lactated ringers bolus 2,000 mL (2,000 mLs Intravenous New Bag/Given 02/20/23 1404)  insulin aspart (novoLOG) injection 8 Units (8 Units Subcutaneous Given 02/20/23 1403)    ED Course/ Medical Decision Making/ A&P                             Medical Decision Making Amount and/or Complexity of Data Reviewed Labs: ordered. Radiology: ordered.  Risk Prescription drug management.   [name] is a 37 y.o. female with comorbidities that  complicate the patient evaluation including insulin-dependent diabetes and anemia who presents to the emergency department with fatigue, dizziness, and shortness of breath  Initial Ddx:  Symptomatic anemia, DKA, PE, pneumonia, dehydration  MDM/Course:  Patient presented to the emergency department with acute on subacute generalized weakness, dizziness, and shortness of breath.  Lung sounds are clear to auscultation bilaterally.  Not having any chest pain, tachycardia, or hypoxia that would be concerning for pulmonary embolism.  No infectious symptoms that would suggest pneumonia.  Also had a chest x-ray that did not show any infiltrates.  Her hemoglobin today is 9.1 which is improved from prior of 7.  Also was found to be hyperglycemic without any evidence of DKA.  Was given fluids and insulin with improvement of her blood sugar.  Suspect that she may be dehydrated from her elevated blood sugars.  Was recently changed to NPH twice daily rather than her NovoLog and Lantus.  Upon re-evaluation was well-appearing.  She was counseled on resuming her prior regimen of NovoLog 15 units 3 times daily and Lantus 15 units nightly.  Suspect that this is likely the cause of her symptoms since she is dehydrated from her hyperglycemia.  Will have her follow-up with her primary doctor in several days to discuss her insulin regimen.  This patient presents to the ED for concern of complaints listed in HPI, this involves an extensive number of treatment options, and is a complaint that carries with it a high risk of complications and morbidity. Disposition including potential need for admission considered.   Dispo: DC Home. Return precautions discussed including, but not limited to, those listed in  the AVS. Allowed pt time to ask questions which were answered fully prior to dc.  Additional history obtained from father Records reviewed Outpatient Clinic Notes The following labs were independently interpreted: Chemistry  and show  hyperglycemia without DKA I independently reviewed the following imaging with scope of interpretation limited to determining acute life threatening conditions related to emergency care: Chest x-ray and agree with the radiologist interpretation with the following exceptions: none I personally reviewed and interpreted cardiac monitoring: normal sinus rhythm  I personally reviewed and interpreted the pt's EKG: see above for interpretation  I have reviewed the patients home medications and made adjustments as needed        Final Clinical Impression(s) / ED Diagnoses Final diagnoses:  Hyperglycemia  Shortness of breath  Other fatigue  Anemia, unspecified type    Rx / DC Orders ED Discharge Orders     None         Rondel Baton, MD 02/20/23 1547

## 2023-02-20 NOTE — ED Triage Notes (Signed)
Pt arrived POV from home c/o a low hemoglobin. Pt states her doctor has been monitoring her for awhile and stated she did not need a transfusion but now pt is fatigue, SHOB with exertion and feels like she might could benefit from one.

## 2023-02-20 NOTE — ED Notes (Incomplete)
The pt is c/o a headache and some sob  she reports that she has sob frequent

## 2023-02-20 NOTE — Discharge Instructions (Signed)
You were seen for your fatigue and shortness of breath in the emergency department.  Your hemoglobin (blood counts) were improved from your previous labs.  Your blood sugar was severely elevated.  At home, please go back to your previous Lantus and NovoLog regimen.    Check your MyChart online for the results of any tests that had not resulted by the time you left the emergency department.   Follow-up with your primary doctor in 2-3 days regarding your visit.    Return immediately to the emergency department if you experience any of the following: Worsening shortness of breath, chest pain, or any other concerning symptoms.    Thank you for visiting our Emergency Department. It was a pleasure taking care of you today.

## 2023-02-23 LAB — CBG MONITORING, ED: Glucose-Capillary: >600 mg/dL (ref 70–99)

## 2023-02-24 ENCOUNTER — Other Ambulatory Visit: Payer: Self-pay | Admitting: Family Medicine

## 2023-02-24 ENCOUNTER — Ambulatory Visit: Payer: Self-pay

## 2023-02-24 DIAGNOSIS — S29012S Strain of muscle and tendon of back wall of thorax, sequela: Secondary | ICD-10-CM

## 2023-02-26 ENCOUNTER — Other Ambulatory Visit (INDEPENDENT_AMBULATORY_CARE_PROVIDER_SITE_OTHER): Payer: Commercial Managed Care - HMO

## 2023-02-26 DIAGNOSIS — D3500 Benign neoplasm of unspecified adrenal gland: Secondary | ICD-10-CM | POA: Diagnosis not present

## 2023-02-26 LAB — CORTISOL: Cortisol, Plasma: 5.9 ug/dL

## 2023-03-03 ENCOUNTER — Encounter: Payer: Self-pay | Admitting: "Endocrinology

## 2023-03-03 ENCOUNTER — Ambulatory Visit (INDEPENDENT_AMBULATORY_CARE_PROVIDER_SITE_OTHER): Payer: Commercial Managed Care - HMO | Admitting: "Endocrinology

## 2023-03-03 ENCOUNTER — Other Ambulatory Visit (HOSPITAL_COMMUNITY): Payer: Self-pay

## 2023-03-03 ENCOUNTER — Other Ambulatory Visit: Payer: Self-pay

## 2023-03-03 VITALS — BP 100/75 | HR 85 | Ht 68.0 in | Wt 236.2 lb

## 2023-03-03 DIAGNOSIS — Z794 Long term (current) use of insulin: Secondary | ICD-10-CM

## 2023-03-03 DIAGNOSIS — E1165 Type 2 diabetes mellitus with hyperglycemia: Secondary | ICD-10-CM

## 2023-03-03 DIAGNOSIS — E7800 Pure hypercholesterolemia, unspecified: Secondary | ICD-10-CM | POA: Diagnosis not present

## 2023-03-03 MED ORDER — GVOKE HYPOPEN 1-PACK 1 MG/0.2ML ~~LOC~~ SOAJ
1.0000 mg | SUBCUTANEOUS | 2 refills | Status: DC | PRN
Start: 2023-03-03 — End: 2024-04-07
  Filled 2023-03-03: qty 0.4, 2d supply, fill #0

## 2023-03-03 MED ORDER — TRULICITY 0.75 MG/0.5ML ~~LOC~~ SOAJ
0.7500 mg | SUBCUTANEOUS | 3 refills | Status: DC
Start: 2023-03-03 — End: 2023-03-19
  Filled 2023-03-03 – 2023-03-18 (×3): qty 2, 28d supply, fill #0
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
## 2023-03-03 NOTE — Patient Instructions (Addendum)
Lantus 25 units every night, increase by 5 units every other day until fasting sugar is less than 120 Novolog 15 units three times a day 15 min before each meal  Start Trulicity 0.75 mg weekly  _____________   Goals of DM therapy:  Morning Fasting blood sugar: 80-140  Blood sugar before meals: 80-140 Bed time blood sugar: 100-150  A1C <7%, limited only by hypoglycemia  1.Diabetes medications and their side effects discussed, including hypoglycemia    2. Check blood glucose:  a) Always check blood sugars before driving. Please see below (under hypoglycemia) on how to manage b) Check a minimum of 3 times/day or more as needed when having symptoms of hypoglycemia.   c) Try to check blood glucose before sleeping/in the middle of the night to ensure that it is remaining stable and not dropping less than 100 d) Check blood glucose more often if sick  3. Diet: a) 3 meals per day schedule b: Restrict carbs to 60-70 grams (4 servings) per meal c) Colorful vegetables - 3 servings a day, and low sugar fruit 2 servings/day Plate control method: 1/4 plate protein, 1/4 starch, 1/2 green, yellow, or red vegetables d) Avoid carbohydrate snacks unless hypoglycemic episode, or increased physical activity  4. Regular exercise as tolerated, preferably 3 or more hours a week  5. Hypoglycemia: a)  Do not drive or operate machinery without first testing blood glucose to assure it is over 90 mg%, or if dizzy, lightheaded, not feeling normal, etc, or  if foot or leg is numb or weak. b)  If blood glucose less than 70, take four 5gm Glucose tabs or 15-30 gm Glucose gel.  Repeat every 15 min as needed until blood sugar is >100 mg/dl. If hypoglycemia persists then call 911.   6. Sick day management: a) Check blood glucose more often b) Continue usual therapy if blood sugars are elevated.   7. Contact the doctor immediately if blood glucose is frequently <60 mg/dl, or an episode of severe hypoglycemia  occurs (where someone had to give you glucose/  glucagon or if you passed out from a low blood glucose), or if blood glucose is persistently >350 mg/dl, for further management  8. A change in level of physical activity or exercise and a change in diet may also affect your blood sugar. Check blood sugars more often and call if needed.  Instructions: 1. Bring glucose meter, blood glucose records on every visit for review 2. Continue to follow up with primary care physician and other providers for medical care 3. Yearly eye  and foot exam 4. Please get blood work done prior to the next appointment

## 2023-03-04 ENCOUNTER — Other Ambulatory Visit (HOSPITAL_COMMUNITY): Payer: Self-pay

## 2023-03-04 ENCOUNTER — Other Ambulatory Visit: Payer: Commercial Managed Care - HMO

## 2023-03-04 DIAGNOSIS — E119 Type 2 diabetes mellitus without complications: Secondary | ICD-10-CM

## 2023-03-04 DIAGNOSIS — E785 Hyperlipidemia, unspecified: Secondary | ICD-10-CM

## 2023-03-04 DIAGNOSIS — F331 Major depressive disorder, recurrent, moderate: Secondary | ICD-10-CM

## 2023-03-04 DIAGNOSIS — I10 Essential (primary) hypertension: Secondary | ICD-10-CM

## 2023-03-08 ENCOUNTER — Ambulatory Visit: Payer: Commercial Managed Care - HMO | Admitting: Family

## 2023-03-09 ENCOUNTER — Other Ambulatory Visit (HOSPITAL_COMMUNITY): Payer: Self-pay

## 2023-03-10 ENCOUNTER — Other Ambulatory Visit (HOSPITAL_COMMUNITY): Payer: Self-pay

## 2023-03-16 ENCOUNTER — Ambulatory Visit: Payer: Commercial Managed Care - HMO | Admitting: Family

## 2023-03-18 ENCOUNTER — Other Ambulatory Visit: Payer: Self-pay

## 2023-03-18 ENCOUNTER — Encounter: Payer: Self-pay | Admitting: "Endocrinology

## 2023-03-18 ENCOUNTER — Other Ambulatory Visit (HOSPITAL_COMMUNITY): Payer: Self-pay

## 2023-03-18 ENCOUNTER — Other Ambulatory Visit: Payer: Self-pay | Admitting: Family

## 2023-03-18 DIAGNOSIS — N6323 Unspecified lump in the left breast, lower outer quadrant: Secondary | ICD-10-CM

## 2023-03-19 ENCOUNTER — Other Ambulatory Visit (HOSPITAL_COMMUNITY): Payer: Self-pay

## 2023-03-19 ENCOUNTER — Other Ambulatory Visit: Payer: Self-pay

## 2023-03-19 MED ORDER — TRULICITY 0.75 MG/0.5ML ~~LOC~~ SOAJ
0.7500 mg | SUBCUTANEOUS | 3 refills | Status: DC
Start: 2023-03-19 — End: 2023-05-11
  Filled 2023-03-19 – 2023-03-25 (×3): qty 2, 28d supply, fill #0
  Filled 2023-04-20: qty 2, 28d supply, fill #1

## 2023-03-22 ENCOUNTER — Telehealth: Payer: Self-pay

## 2023-03-22 ENCOUNTER — Other Ambulatory Visit: Payer: Self-pay

## 2023-03-22 ENCOUNTER — Other Ambulatory Visit (HOSPITAL_COMMUNITY): Payer: Self-pay

## 2023-03-22 NOTE — Telephone Encounter (Signed)
PA was cancelled through [facility] and submitted to [employer]   EOC:  [id number]

## 2023-03-22 NOTE — Telephone Encounter (Signed)
Her Trulicity is requiring a prior authorization at this time

## 2023-03-22 NOTE — Telephone Encounter (Signed)
Pharmacy Patient Advocate Encounter   Received notification from Pt Calls Messages that prior authorization for Trulicity is required/requested.   Insurance verification completed.   The patient is insured through [employer] .   Per test claim: PA required; PA started via CoverMyMeds. KEY [id number] . Waiting for clinical questions to populate.

## 2023-03-23 NOTE — Telephone Encounter (Signed)
Pharmacy Patient Advocate Encounter  Received notification from [employer] that Prior Authorization for Trulicity has been APPROVED until 03/20/2024

## 2023-03-24 ENCOUNTER — Other Ambulatory Visit (HOSPITAL_COMMUNITY): Payer: Self-pay

## 2023-03-24 NOTE — Telephone Encounter (Signed)
Patient is aware 

## 2023-03-25 ENCOUNTER — Encounter: Payer: Self-pay | Admitting: Podiatry

## 2023-03-25 ENCOUNTER — Other Ambulatory Visit (HOSPITAL_COMMUNITY): Payer: Self-pay

## 2023-03-25 ENCOUNTER — Ambulatory Visit (INDEPENDENT_AMBULATORY_CARE_PROVIDER_SITE_OTHER): Payer: Commercial Managed Care - HMO | Admitting: Podiatry

## 2023-03-25 DIAGNOSIS — M79675 Pain in left toe(s): Secondary | ICD-10-CM

## 2023-03-25 DIAGNOSIS — E1142 Type 2 diabetes mellitus with diabetic polyneuropathy: Secondary | ICD-10-CM | POA: Diagnosis not present

## 2023-03-25 DIAGNOSIS — B351 Tinea unguium: Secondary | ICD-10-CM | POA: Diagnosis not present

## 2023-03-25 DIAGNOSIS — M79674 Pain in right toe(s): Secondary | ICD-10-CM

## 2023-03-25 DIAGNOSIS — M792 Neuralgia and neuritis, unspecified: Secondary | ICD-10-CM | POA: Diagnosis not present

## 2023-03-25 DIAGNOSIS — Z794 Long term (current) use of insulin: Secondary | ICD-10-CM

## 2023-03-25 NOTE — Progress Notes (Signed)
  Subjective:  Patient ID: [name], female    DOB: [birth year],  MRN: [id number]  Chief Complaint  Patient presents with   Diabetes    "It still feels like pins and needles.  Trim my toenails."    37 y.o. female presents with the above complaint. History confirmed with patient. Patient presenting for routine diabetic foot exam.  No complaints currently previously had trouble with an ingrown nail on the left hallux.  But it is not bothering her at all at this time.  No drainage redness or swelling.  She does have a history of type 2 diabetes with and currently on insulin.  Patient has pins and needle sensation in her feet as well as requesting nail trim as she is unable to trim them herself.  Objective:  Physical Exam: warm, good capillary refill nail exam normal nails without lesions DP pulses palpable, PT pulses palpable, and protective sensation intact Left Foot: No open wounds or hyperkeratotic lesions present nails with thickening and dystrophy at this time painful Right Foot: No open wounds or hyperkeratotic lesions present nails with thickening and dystrophy at this time painful  Assessment:   1. Pain due to onychomycosis of toenails of both feet   2. Neuropathic pain   3. Type 2 diabetes mellitus with diabetic polyneuropathy, with long-term current use of insulin (HCC)       Plan:  Patient was evaluated and treated and all questions answered.  # DM2 with neuropathic pain -Patient is taking gabapentin 300 mg 3 times a day however she does not take it due to side effect of being too tired -Discussed possible Qutenza application '-Will check insurance coverage for this and if approved we will proceed with Qutenza for nerve pain related to diabetic neuropathy  #Onychomycosis with pain  -Nails palliatively debrided as below. -Educated on self-care  Procedure: Nail Debridement Rationale: Pain Type of Debridement: manual, sharp debridement. Instrumentation: Nail  nipper, rotary burr. Number of Nails: 10  Return in about 6 months (around 09/25/2023) for [facility].         Corinna Gab, DPM [facility] / [facility]

## 2023-03-30 ENCOUNTER — Other Ambulatory Visit (HOSPITAL_COMMUNITY): Payer: Self-pay

## 2023-04-07 ENCOUNTER — Other Ambulatory Visit (HOSPITAL_COMMUNITY): Payer: Self-pay

## 2023-04-08 ENCOUNTER — Other Ambulatory Visit: Payer: Self-pay

## 2023-04-08 ENCOUNTER — Other Ambulatory Visit (HOSPITAL_COMMUNITY): Payer: Self-pay

## 2023-04-08 ENCOUNTER — Emergency Department (HOSPITAL_COMMUNITY): Payer: Commercial Managed Care - HMO

## 2023-04-08 ENCOUNTER — Emergency Department (HOSPITAL_COMMUNITY)
Admission: EM | Admit: 2023-04-08 | Discharge: 2023-04-08 | Disposition: A | Discharge status: Home / Self Care | Payer: Commercial Managed Care - HMO | Attending: Emergency Medicine | Admitting: Emergency Medicine

## 2023-04-08 DIAGNOSIS — E1165 Type 2 diabetes mellitus with hyperglycemia: Secondary | ICD-10-CM | POA: Diagnosis not present

## 2023-04-08 DIAGNOSIS — I10 Essential (primary) hypertension: Secondary | ICD-10-CM | POA: Insufficient documentation

## 2023-04-08 DIAGNOSIS — F17210 Nicotine dependence, cigarettes, uncomplicated: Secondary | ICD-10-CM | POA: Insufficient documentation

## 2023-04-08 DIAGNOSIS — J45909 Unspecified asthma, uncomplicated: Secondary | ICD-10-CM | POA: Insufficient documentation

## 2023-04-08 DIAGNOSIS — R079 Chest pain, unspecified: Secondary | ICD-10-CM | POA: Insufficient documentation

## 2023-04-08 LAB — TROPONIN I (HIGH SENSITIVITY)
Troponin I (High Sensitivity): 3 ng/L (ref <18)
Troponin I (High Sensitivity): 2 ng/L (ref <18)

## 2023-04-08 LAB — CBC
HCT: 33.7 % — ABNORMAL LOW (ref 36.0–46.0)
Hemoglobin: 10.3 g/dL — ABNORMAL LOW (ref 12.0–15.0)
MCH: 20.3 pg — ABNORMAL LOW (ref 26.0–34.0)
MCHC: 30.6 g/dL (ref 30.0–36.0)
MCV: 66.5 fL — ABNORMAL LOW (ref 80.0–100.0)
Platelets: 344 10*3/uL (ref 150–400)
RBC: 5.07 MIL/uL (ref 3.87–5.11)
RDW: 18.9 % — ABNORMAL HIGH (ref 11.5–15.5)
WBC: 4.8 10*3/uL (ref 4.0–10.5)
nRBC: 0 % (ref 0.0–0.2)

## 2023-04-08 LAB — BASIC METABOLIC PANEL
Anion gap: 12 (ref 5–15)
BUN: 13 mg/dL (ref 6–20)
CO2: 23 mmol/L (ref 22–32)
Calcium: 9.3 mg/dL (ref 8.9–10.3)
Chloride: 93 mmol/L — ABNORMAL LOW (ref 98–111)
Creatinine, Ser: 0.82 mg/dL (ref 0.44–1.00)
GFR, Estimated: >60 mL/min (ref >60)
Glucose, Bld: 507 mg/dL (ref 70–99)
Potassium: 4 mmol/L (ref 3.5–5.1)
Sodium: 128 mmol/L — ABNORMAL LOW (ref 135–145)

## 2023-04-08 LAB — HCG, SERUM, QUALITATIVE: Preg, Serum: NEGATIVE

## 2023-04-08 MED ORDER — SUCRALFATE 1 G PO TABS
1.0000 g | ORAL_TABLET | Freq: Four times a day (QID) | ORAL | 0 refills | Status: DC | PRN
  Filled 2023-04-08: qty 30, 8d supply, fill #0

## 2023-04-08 NOTE — ED Notes (Signed)
Assumed care of patient here from home c/o chest pain with left arm radiation and sob that started 1 hour pta. Patient is diabetic speaks in full complete sentences ambulates with steady gait is a/o x 4 in no acute distress

## 2023-04-08 NOTE — ED Triage Notes (Signed)
Patient reports left upper chest pain radiating to left arm with mild SOB this evening , no emesis or diaphoresis .
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
## 2023-04-08 NOTE — Discharge Instructions (Signed)
You were evaluated in the Emergency Department and after careful evaluation, we did not find any emergent condition requiring admission or further testing in the hospital.  Your exam/testing today is overall reassuring.  No signs of heart damage or strain.  Recommend follow-up with your primary care doctor to discuss her symptoms.  Can try the Carafate as needed to treat for acid related pain.  Please return to the Emergency Department if you experience any worsening of your condition.   Thank you for allowing [place] to be a part of your care.

## 2023-04-12 ENCOUNTER — Other Ambulatory Visit (HOSPITAL_COMMUNITY): Payer: Self-pay

## 2023-04-12 ENCOUNTER — Other Ambulatory Visit: Payer: Self-pay | Admitting: Family

## 2023-04-12 MED ORDER — FREESTYLE LIBRE 3 SENSOR MISC
1.0000 | 0 refills | Status: DC
  Filled 2023-04-12: qty 2, 28d supply, fill #0
  Filled 2023-05-04: qty 2, 28d supply, fill #1

## 2023-04-13 ENCOUNTER — Other Ambulatory Visit (HOSPITAL_COMMUNITY): Payer: Self-pay

## 2023-04-14 ENCOUNTER — Ambulatory Visit
Admission: RE | Admit: 2023-04-14 | Discharge: 2023-04-14 | Disposition: A | Discharge status: Home / Self Care | Payer: Commercial Managed Care - HMO | Source: Ambulatory Visit | Attending: Family | Admitting: Family

## 2023-04-14 ENCOUNTER — Ambulatory Visit (INDEPENDENT_AMBULATORY_CARE_PROVIDER_SITE_OTHER): Payer: Commercial Managed Care - HMO | Admitting: Physician Assistant

## 2023-04-14 ENCOUNTER — Encounter (HOSPITAL_COMMUNITY): Payer: Self-pay | Admitting: Physician Assistant

## 2023-04-14 DIAGNOSIS — F419 Anxiety disorder, unspecified: Secondary | ICD-10-CM | POA: Diagnosis not present

## 2023-04-14 DIAGNOSIS — N6323 Unspecified lump in the left breast, lower outer quadrant: Secondary | ICD-10-CM

## 2023-04-14 DIAGNOSIS — F331 Major depressive disorder, recurrent, moderate: Secondary | ICD-10-CM | POA: Diagnosis not present

## 2023-04-14 MED ORDER — SERTRALINE HCL 50 MG PO TABS: 50.0000 mg | ORAL_TABLET | Freq: Every day | ORAL | 2 refills | Status: DC

## 2023-04-14 NOTE — Progress Notes (Signed)
[facility] MD/PA/NP OP Progress Note  04/14/2023 5:49 PM [name]  MRN:  [id number]  Chief Complaint:  Chief Complaint  Patient presents with   Follow-up   Medication Refill   HPI:   [name]. [name] is a 37 year old, African-American female with a past psychiatric history significant for major depressive disorder and anxiety who presents to [facility] for follow-up and medication management.  Patient is currently being managed on the following medication: Sertraline 50 mg daily.  Patient presents to the encounter stating that she has been emotionally drained since her husband started dialysis.  She reports that trying to remain supportive of her husband while having the finances has been draining.  She states that her husband is currently at stage V kidney failure and they are currently working to get him on the transplant list.  Patient reports that her medications have been fine and helpful.  She continues to endorse depression attributed to life stressors.  Some of her stressors include her daughters disability check being recently cut off.  He reports that she is currently in the process of trying to get her daughter back on SSI.  Patient states that she is currently working at [employer] after having to switch jobs due to taking on the financial burden and attending school.  Patient rates her depression as 5 out of 10 with 10 being most severe.  Patient endorses depressive episodes 3 days out of the week.  Patient endorses the following depressive symptoms: feelings of sadness, lack of motivation, decreased energy, and increased sleep.  Patient reports that she often feels like she is not being heard and there is constantly having to repeat herself.  In addition to depression, patient also endorses anxiety and rates her anxiety a 4 out of 10.  A PHQ-9 screen was performed with the patient scoring at 17.  A GAD-7 screen was also performed with the  patient scoring a 19.  Patient is alert and oriented x 4, calm, cooperative, and fully engaged in conversation during the encounter.  Patient reports that she is doing mentally and physically.  Patient denies suicidal or homicidal ideations.  She further denies auditory or visual hallucinations and does not appear to be responding to internal/external stimuli.  Patient endorses fair sleep and receives either 6 hours of sleep per night.  Patient endorses good appetite and eats on average 2-3 meals per day.  Patient endorses tobacco use and smokes Black and Milds on occasion.  Denies alcohol consumption or illicit drug use.  Visit Diagnosis:    ICD-10-CM   1. Moderate episode of recurrent major depressive disorder (HCC)  F33.1 sertraline (ZOLOFT) 50 MG tablet    2. Anxiety disorder, unspecified type  F41.9 sertraline (ZOLOFT) 50 MG tablet       Past Psychiatric History:  Patient endorses a past history depression and anxiety   Past Medical History:  Past Medical History:  Diagnosis Date   Asthma    COVID-19 07/04/2020   05/04/20   Diabetes mellitus without complication (HCC)    DKA (diabetic ketoacidoses) 08/27/2018   Family history of diabetes mellitus in mother 01/10/2018   Fibroid    pedunculated posterior RUQ near fundus. approx 3-4cm at time of 01/2016 c-section   GBS (group B streptococcus) infection    Hearing loss    pt states she was tested in high school and told it was selective she says she continues to have a deficit and does not feel it is  selective   Herpes    Herpes simplex vulvovaginitis 09/06/2020   Hypertension    Newly diagnosed diabetes (HCC) 01/10/2018   Diagnosed 01/10/18  A1c 7.5%   Sickle cell trait (HCC) 09/01/2014   [ ]  ucx q trimester   Trichomonas vaginitis     Past Surgical History:  Procedure Laterality Date   CESAREAN SECTION N/A 01/08/2016   Procedure: CESAREAN SECTION;  Surgeon: Plymouth Bing, MD;  Location: [facility] BIRTHING SUIT[facility];  Service: Obstetrics;   Laterality: N/A;   CESAREAN SECTION N/A 09/10/2020   Procedure: CESAREAN SECTION;  Surgeon: Levie Heritage, DO;  Location: [facility] LD ORS;  Service: Obstetrics;  Laterality: N/A;   DILATION AND CURETTAGE OF UTERUS     TUBAL LIGATION  09/10/2020   Procedure: BILATERAL TUBAL LIGATION;  Surgeon: Levie Heritage, DO;  Location: [facility] LD ORS;  Service: Obstetrics;;   WISDOM TOOTH EXTRACTION      Family Psychiatric History:  Mother - patient reports that her mother states that she has "everything in the book" in regards to her mental health Sister - history of suicide and depression Brother (deceased) - depression   Family history of suicide attempt: Patient reports that her mother and sister have attempted suicide Family history of homicide attempt: Patient denies a family history of homicide attempts Family history of substance abuse: Patient reports that her mother abused drugs in the past  Family History:  Family History  Problem Relation Age of Onset   Diabetes Mother    Hypertension Mother    Hyperlipidemia Mother    Heart disease Mother    Breast cancer Mother        53   Ovarian cancer Mother        Unknown age   Diabetes Sister    Diabetes Brother    Breast cancer Cousin    Heart disease Maternal Grandmother     Social History:  Social History   Socioeconomic History   Marital status: Married    Spouse name: Not on file   Number of children: Not on file   Years of education: Not on file   Highest education level: Not on file  Occupational History   Not on file  Tobacco Use   Smoking status: Some Days    Types: Cigarettes, E-cigarettes   Smokeless tobacco: Never   Tobacco comments:    quit 2013 cigarettes, I vape every once in a blue moon.  Vaping Use   Vaping status: Never Used  Substance and Sexual Activity   Alcohol use: No   Drug use: No   Sexual activity: Yes    Birth control/protection: None  Other Topics Concern   Not on file  Social History Narrative    Not on file   Social Determinants of Health   Financial Resource Strain: Not on File (01/25/2023)   Received from [employer]    Financial Resource Strain: 0  Food Insecurity: Not on file (02/16/2023)  Transportation Needs: At Risk (02/16/2023)   Received from [employer] Needs    Transportation: 2  Physical Activity: Not on File (01/25/2023)   Received from [facility]   Physical Activity    Physical Activity: 0  Stress: Not on File (01/25/2023)   Received from [facility]   Stress    Stress: 0  Social Connections: Not on File (04/08/2023)   Received from [facility]   Social Connections    Connectedness: 0    Allergies: No Known Allergies  Metabolic Disorder Labs: Lab Results  Component Value Date   HGBA1C >14.0 (H) 09/25/2022   MPG  09/25/2022     Comment:     eAG cannot be calculated. Hemoglobin A1c result exceeds the linearity of the assay. . HbA1c performed on Roche platform. Effective 05/11/22 a change in test platforms may have  shifted HbA1c results compared to historical results.    MPG 162.81 09/10/2020   No results found for: "PROLACTIN" Lab Results  Component Value Date   CHOL 185 09/25/2022   TRIG 82 09/25/2022   HDL 53 09/25/2022   CHOLHDL 3.5 09/25/2022   LDLCALC 114 (H) 09/25/2022   LDLCALC 138 (H) 12/05/2020   Lab Results  Component Value Date   TSH 1.13 09/25/2022   TSH 1.350 11/28/2020    Therapeutic Level Labs: No results found for: "LITHIUM" No results found for: "VALPROATE" No results found for: "CBMZ"  Current Medications: Current Outpatient Medications  Medication Sig Dispense Refill   albuterol (VENTOLIN HFA) 108 (90 Base) MCG/ACT inhaler INHALE 2 PUFFS INTO THE LUNGS EVERY 6 HOURS AS NEEDED FOR WHEEZING OR SHORTNESS OF BREATH 18 g 5   atorvastatin (LIPITOR) 20 MG tablet Take 1 tablet (20 mg total) by mouth daily. 90 tablet 1   Continuous Blood Gluc Receiver (FREESTYLE LIBRE 3 READER) DEVI 1 Device by Does not  apply route in the morning, at noon, in the evening, and at bedtime. 1 each 5   Continuous Glucose Receiver (FREESTYLE LIBRE 3 READER) DEVI Use 1 device as directed 2 (two) times daily 1 each 0   Continuous Glucose Sensor (FREESTYLE LIBRE 3 SENSOR) MISC Use as directed every 14 days 8 each 0   cyclobenzaprine (FLEXERIL) 10 MG tablet TAKE 1 TABLET( 10 MG TOTAL) BY MOUTH DAILY AS NEEDED FOR MUSCLE SPASMS 30 tablet 1   Dulaglutide (TRULICITY) 0.75 MG/0.5ML SOPN Inject 0.75 mg into the skin once a week. 2 mL 3   ferrous sulfate 325 (65 FE) MG EC tablet Take 1 tablet (325 mg total) by mouth 2 (two) times daily with a meal. 90 tablet 1   Glucagon (GVOKE HYPOPEN 1-PACK) 1 MG/0.2ML SOAJ Inject 1 mg into the skin as needed (low blood sugar with impaired consciousness). 0.4 mL 2   Insulin Aspart FlexPen (NOVOLOG) 100 UNIT/ML INJECT 15 UNITS INTO THE SKIN THREE TIMES DAILY WITH MEALS for blood sugars > 150 15 mL 2   Insulin Glargine (BASAGLAR KWIKPEN) 100 UNIT/ML Inject 24 Units into the skin at bedtime. 9 mL 3   insulin isophane & regular human KwikPen (HUMULIN 70/30 MIX) (70-30) 100 UNIT/ML KwikPen Inject 10 Units into the skin 2 (two) times daily. 15 mL 1   Insulin Pen Needle (AUM MINI INSULIN PEN NEEDLE) 32G X 6 MM MISC Use with Lantus nightly and NPH twice daily 100 each 11   medroxyPROGESTERone (PROVERA) 5 MG tablet Take 2 tablets (10 mg total) by mouth daily. 60 tablet 1   Prenatal 28-0.8 MG TABS Take 1 tablet by mouth daily. 30 tablet 12   sertraline (ZOLOFT) 50 MG tablet Take 1 tablet (50 mg total) by mouth daily. 30 tablet 2   sucralfate (CARAFATE) 1 g tablet Take 1 tablet (1 g total) by mouth 4 (four) times daily as needed. 30 tablet 0   Vitamin D, Ergocalciferol, (DRISDOL) 1.25 MG (50000 UNIT) CAPS capsule Take 1 capsule (50,000 Units total) by mouth every 7 (seven) days. 4 capsule 2   No current facility-administered medications for this visit.  Musculoskeletal: Strength & Muscle Tone:  within normal limits Gait & Station: normal Patient leans: N/A  Psychiatric Specialty Exam: Review of Systems  Psychiatric/Behavioral:  Positive for sleep disturbance. Negative for decreased concentration, dysphoric mood, hallucinations, self-injury and suicidal ideas. The patient is nervous/anxious. The patient is not hyperactive.     Blood pressure 122/87, pulse 75, temperature 98.5 F (36.9 C), temperature source Oral, height 5' 8.75" (1.746 m), weight 236 lb (107 kg), last menstrual period 03/26/2023, SpO2 100%.Body mass index is 35.11 kg/m.  General Appearance: Casual  Eye Contact:  Good  Speech:  Clear and Coherent and Normal Rate  Volume:  Normal  Mood:  Anxious and Depressed  Affect:  Congruent  Thought Process:  Coherent and Descriptions of Associations: Intact  Orientation:  Full (Time, Place, and Person)  Thought Content: WDL   Suicidal Thoughts:  No  Homicidal Thoughts:  No  Memory:  Immediate;   Good Recent;   Good Remote;   Good  Judgement:  Good  Insight:  Good  Psychomotor Activity:  Normal  Concentration:  Concentration: Good and Attention Span: Good  Recall:  Good  Fund of Knowledge: Good  Language: Good  Akathisia:  No  Handed:  Right  AIMS (if indicated): not done  Assets:  Communication Skills Desire for Improvement Social Support  ADL's:  Intact  Cognition: WNL  Sleep:  Fair   Screenings: GAD-7    Flowsheet Row Clinical Support from 04/14/2023 in [facility] Clinical Support from 02/10/2023 in [facility] Clinical Support from 12/08/2022 in [facility] Office Visit from 10/21/2022 in [facility] Clinical Support from 10/13/2022 in [facility]  Total GAD-7 Score 19 17 12 19 19       PHQ2-9    Flowsheet Row Clinical Support from 04/14/2023 in [facility]  Clinical Support from 02/10/2023 in [facility] Clinical Support from 12/08/2022 in [facility] Office Visit from 10/21/2022 in [facility] Clinical Support from 10/13/2022 in [facility]  PHQ-2 Total Score 3 2 0 5 5  PHQ-9 Total Score 17 10 -- 18 15      Flowsheet Row Clinical Support from 04/14/2023 in [facility] ED from 04/08/2023 in [facility] Emergency Department at [facility] ED from 02/20/2023 in [facility] Emergency Department at [facility]  C-SSRS RISK CATEGORY No Risk No Risk No Risk        Assessment and Plan:   Adriona Woolbert. [name] is a 37 year old, African-American female with a past psychiatric history significant for major depressive disorder and anxiety who presents to [facility] for follow-up and medication management.  Patient presents to the encounter reporting that she is emotionally and physically drained due to the stressors in her life.  Patient also attributes her depression and anxiety to stressors in her life.  Although patient continues to endorse depression and anxiety, she states that her use of sertraline 50 mg daily has been helpful.  Patient would like to continue taking her medication as prescribed and denies the need for dosage adjustments at this time.  Patient's medication to be e-prescribed to pharmacy of choice.  Collaboration of Care: Collaboration of Care: Medication Management AEB provider managing patient's psychiatric medications, Psychiatrist AEB patient being seen by  mental health provider at this facility, and Referral or follow-up with counselor/therapist AEB patient being seen by a licensed clinical social worker at this facility  Patient/Guardian was advised Release of Information must be obtained prior to any record release in  order to collaborate their care with an outside provider. Patient/Guardian was advised if they have not already done so to contact the registration department to sign all necessary forms in order for [place] to release information regarding their care.   Consent: Patient/Guardian gives verbal consent for treatment and assignment of benefits for services provided during this visit. Patient/Guardian expressed understanding and agreed to proceed.   1. Moderate episode of recurrent major depressive disorder (HCC)  - sertraline (ZOLOFT) 50 MG tablet; Take 1 tablet (50 mg total) by mouth daily.  Dispense: 30 tablet; Refill: 2  2. Anxiety disorder, unspecified type  - sertraline (ZOLOFT) 50 MG tablet; Take 1 tablet (50 mg total) by mouth daily.  Dispense: 30 tablet; Refill: 2  Patient to follow-up in 2 months Provider spent a total of 18 minutes with the patient/reviewing patient's chart  Meta Hatchet, PA 04/14/2023, 5:49 PM

## 2023-04-19 ENCOUNTER — Other Ambulatory Visit: Payer: Self-pay

## 2023-04-19 ENCOUNTER — Other Ambulatory Visit (HOSPITAL_COMMUNITY): Payer: Self-pay

## 2023-04-29 ENCOUNTER — Other Ambulatory Visit (HOSPITAL_COMMUNITY): Payer: Self-pay

## 2023-05-03 DIAGNOSIS — M5441 Lumbago with sciatica, right side: Secondary | ICD-10-CM | POA: Insufficient documentation

## 2023-05-03 DIAGNOSIS — G8929 Other chronic pain: Secondary | ICD-10-CM | POA: Insufficient documentation

## 2023-05-04 ENCOUNTER — Other Ambulatory Visit (HOSPITAL_COMMUNITY): Payer: Self-pay

## 2023-05-05 ENCOUNTER — Other Ambulatory Visit (HOSPITAL_COMMUNITY): Payer: Self-pay

## 2023-05-05 ENCOUNTER — Other Ambulatory Visit: Payer: Self-pay

## 2023-05-06 ENCOUNTER — Other Ambulatory Visit (HOSPITAL_COMMUNITY): Payer: Self-pay

## 2023-05-06 ENCOUNTER — Other Ambulatory Visit (HOSPITAL_BASED_OUTPATIENT_CLINIC_OR_DEPARTMENT_OTHER): Payer: Self-pay

## 2023-05-07 ENCOUNTER — Telehealth: Payer: Self-pay

## 2023-05-07 NOTE — Telephone Encounter (Signed)
Prior authorization submitted for Insulin Aspart Flex Pen 100U/Ml

## 2023-05-10 ENCOUNTER — Ambulatory Visit: Payer: Commercial Managed Care - HMO | Admitting: "Endocrinology

## 2023-05-11 ENCOUNTER — Encounter: Payer: Self-pay | Admitting: "Endocrinology

## 2023-05-11 ENCOUNTER — Other Ambulatory Visit (HOSPITAL_COMMUNITY): Payer: Self-pay

## 2023-05-11 ENCOUNTER — Other Ambulatory Visit: Payer: Self-pay

## 2023-05-11 ENCOUNTER — Ambulatory Visit (INDEPENDENT_AMBULATORY_CARE_PROVIDER_SITE_OTHER): Payer: Commercial Managed Care - HMO | Admitting: "Endocrinology

## 2023-05-11 VITALS — BP 116/74 | HR 102 | Resp 20 | Ht 68.75 in | Wt 233.0 lb

## 2023-05-11 DIAGNOSIS — E7800 Pure hypercholesterolemia, unspecified: Secondary | ICD-10-CM

## 2023-05-11 DIAGNOSIS — Z794 Long term (current) use of insulin: Secondary | ICD-10-CM | POA: Diagnosis not present

## 2023-05-11 DIAGNOSIS — E1165 Type 2 diabetes mellitus with hyperglycemia: Secondary | ICD-10-CM | POA: Diagnosis not present

## 2023-05-11 LAB — POCT GLYCOSYLATED HEMOGLOBIN (HGB A1C): Hemoglobin A1C: 15 % — AB (ref 4.0–5.6)

## 2023-05-11 MED ORDER — INSULIN LISPRO (1 UNIT DIAL) 100 UNIT/ML (KWIKPEN)
15.0000 [IU] | PEN_INJECTOR | Freq: Three times a day (TID) | SUBCUTANEOUS | 1 refills | Status: DC
Start: 2023-05-11 — End: 2023-12-29
  Filled 2023-05-11 (×2): qty 39, 87d supply, fill #0
  Filled 2023-07-12 – 2023-08-18 (×2): qty 39, 87d supply, fill #1
  Filled 2023-11-19: qty 12, 27d supply, fill #0
  Filled 2023-11-19: qty 12, 27d supply, fill #2
  Filled 2023-12-02: qty 12, 27d supply, fill #0

## 2023-05-11 MED ORDER — BASAGLAR KWIKPEN 100 UNIT/ML ~~LOC~~ SOPN
45.0000 [IU] | PEN_INJECTOR | Freq: Every day | SUBCUTANEOUS | 3 refills | Status: DC
Start: 2023-05-11 — End: 2023-11-01
  Filled 2023-05-11 (×2): qty 15, 33d supply, fill #0
  Filled 2023-06-08: qty 15, 33d supply, fill #1
  Filled 2023-07-12: qty 15, 33d supply, fill #2
  Filled 2023-08-18: qty 15, 33d supply, fill #3

## 2023-05-11 MED ORDER — TRULICITY 0.75 MG/0.5ML ~~LOC~~ SOAJ
0.7500 mg | SUBCUTANEOUS | 3 refills | Status: DC
Start: 2023-05-11 — End: 2023-06-22
  Filled 2023-05-11 – 2023-05-31 (×4): qty 2, 28d supply, fill #0
  Filled 2023-06-21: qty 2, 30d supply, fill #1
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
## 2023-05-18 ENCOUNTER — Emergency Department (HOSPITAL_COMMUNITY): Payer: Commercial Managed Care - HMO

## 2023-05-18 ENCOUNTER — Other Ambulatory Visit: Payer: Self-pay

## 2023-05-18 ENCOUNTER — Encounter (HOSPITAL_COMMUNITY): Payer: Self-pay

## 2023-05-18 ENCOUNTER — Emergency Department (HOSPITAL_COMMUNITY)
Admission: EM | Admit: 2023-05-18 | Discharge: 2023-05-18 | Disposition: A | Discharge status: Home / Self Care | Payer: Commercial Managed Care - HMO | Attending: Emergency Medicine | Admitting: Emergency Medicine

## 2023-05-18 DIAGNOSIS — R079 Chest pain, unspecified: Secondary | ICD-10-CM

## 2023-05-18 DIAGNOSIS — E1165 Type 2 diabetes mellitus with hyperglycemia: Secondary | ICD-10-CM | POA: Insufficient documentation

## 2023-05-18 DIAGNOSIS — I10 Essential (primary) hypertension: Secondary | ICD-10-CM | POA: Insufficient documentation

## 2023-05-18 DIAGNOSIS — J45909 Unspecified asthma, uncomplicated: Secondary | ICD-10-CM | POA: Insufficient documentation

## 2023-05-18 DIAGNOSIS — Z794 Long term (current) use of insulin: Secondary | ICD-10-CM | POA: Insufficient documentation

## 2023-05-18 DIAGNOSIS — M940 Chondrocostal junction syndrome [Tietze]: Secondary | ICD-10-CM | POA: Insufficient documentation

## 2023-05-18 DIAGNOSIS — R739 Hyperglycemia, unspecified: Secondary | ICD-10-CM

## 2023-05-18 LAB — CBG MONITORING, ED
Glucose-Capillary: >600 mg/dL (ref 70–99)
Glucose-Capillary: 393 mg/dL — ABNORMAL HIGH (ref 70–99)

## 2023-05-18 LAB — I-STAT CHEM 8, ED
BUN: 11 mg/dL (ref 6–20)
Calcium, Ion: 1.09 mmol/L — ABNORMAL LOW (ref 1.15–1.40)
Chloride: 95 mmol/L — ABNORMAL LOW (ref 98–111)
Creatinine, Ser: 0.7 mg/dL (ref 0.44–1.00)
Glucose, Bld: 641 mg/dL (ref 70–99)
HCT: 37 % (ref 36.0–46.0)
Hemoglobin: 12.6 g/dL (ref 12.0–15.0)
Potassium: 4 mmol/L (ref 3.5–5.1)
Sodium: 130 mmol/L — ABNORMAL LOW (ref 135–145)
TCO2: 22 mmol/L (ref 22–32)

## 2023-05-18 LAB — CBC
HCT: 34.9 % — ABNORMAL LOW (ref 36.0–46.0)
Hemoglobin: 10.9 g/dL — ABNORMAL LOW (ref 12.0–15.0)
MCH: 21.3 pg — ABNORMAL LOW (ref 26.0–34.0)
MCHC: 31.2 g/dL (ref 30.0–36.0)
MCV: 68.2 fL — ABNORMAL LOW (ref 80.0–100.0)
Platelets: 285 10*3/uL (ref 150–400)
RBC: 5.12 MIL/uL — ABNORMAL HIGH (ref 3.87–5.11)
RDW: 17.3 % — ABNORMAL HIGH (ref 11.5–15.5)
WBC: 5.6 10*3/uL (ref 4.0–10.5)
nRBC: 0 % (ref 0.0–0.2)

## 2023-05-18 LAB — I-STAT VENOUS BLOOD GAS, ED
Acid-base deficit: 1 mmol/L (ref 0.0–2.0)
Bicarbonate: 20.4 mmol/L (ref 20.0–28.0)
Calcium, Ion: 0.84 mmol/L — CL (ref 1.15–1.40)
HCT: 34 % — ABNORMAL LOW (ref 36.0–46.0)
Hemoglobin: 11.6 g/dL — ABNORMAL LOW (ref 12.0–15.0)
O2 Saturation: 57 %
Potassium: 3.8 mmol/L (ref 3.5–5.1)
Sodium: 131 mmol/L — ABNORMAL LOW (ref 135–145)
TCO2: 21 mmol/L — ABNORMAL LOW (ref 22–32)
pCO2, Ven: 24.8 mm[Hg] — ABNORMAL LOW (ref 44–60)
pH, Ven: 7.524 — ABNORMAL HIGH (ref 7.25–7.43)
pO2, Ven: 26 mm[Hg] — CL (ref 32–45)

## 2023-05-18 LAB — URINALYSIS, ROUTINE W REFLEX MICROSCOPIC
Bilirubin Urine: NEGATIVE
Glucose, UA: >=500 mg/dL — AB
Hgb urine dipstick: NEGATIVE
Ketones, ur: 20 mg/dL — AB
Leukocytes,Ua: NEGATIVE
Nitrite: NEGATIVE
Protein, ur: NEGATIVE mg/dL
Specific Gravity, Urine: 1.024 (ref 1.005–1.030)
pH: 6 (ref 5.0–8.0)

## 2023-05-18 LAB — LIPASE, BLOOD: Lipase: 39 U/L (ref 11–51)

## 2023-05-18 LAB — TROPONIN I (HIGH SENSITIVITY)
Troponin I (High Sensitivity): 2 ng/L (ref <18)
Troponin I (High Sensitivity): 3 ng/L (ref <18)

## 2023-05-18 LAB — BETA-HYDROXYBUTYRIC ACID: Beta-Hydroxybutyric Acid: 0.66 mmol/L — ABNORMAL HIGH (ref 0.05–0.27)

## 2023-05-18 LAB — D-DIMER, QUANTITATIVE: D-Dimer, Quant: 0.37 ug{FEU}/mL (ref 0.00–0.50)

## 2023-05-18 LAB — HCG, SERUM, QUALITATIVE: Preg, Serum: NEGATIVE

## 2023-05-18 MED ORDER — LACTATED RINGERS IV BOLUS
2000.0000 mL | Freq: Once | INTRAVENOUS | Status: AC
  Administered 2023-05-18: 2000 mL via INTRAVENOUS

## 2023-05-18 MED ORDER — LACTATED RINGERS IV BOLUS
1000.0000 mL | Freq: Once | INTRAVENOUS | Status: AC
  Administered 2023-05-18: 1000 mL via INTRAVENOUS

## 2023-05-18 MED ORDER — KETOROLAC TROMETHAMINE 15 MG/ML IJ SOLN
15.0000 mg | Freq: Once | INTRAMUSCULAR | Status: AC
  Administered 2023-05-18: 15 mg via INTRAVENOUS
  Filled 2023-05-18: qty 1

## 2023-05-18 MED ORDER — INSULIN GLARGINE-YFGN 100 UNIT/ML ~~LOC~~ SOLN
45.0000 [IU] | Freq: Once | SUBCUTANEOUS | Status: AC
  Administered 2023-05-18: 45 [IU] via SUBCUTANEOUS
  Filled 2023-05-18: qty 0.45

## 2023-05-18 MED ORDER — INSULIN ASPART 100 UNIT/ML IJ SOLN
10.0000 [IU] | Freq: Once | INTRAMUSCULAR | Status: AC
  Administered 2023-05-18: 10 [IU] via SUBCUTANEOUS

## 2023-05-18 NOTE — ED Notes (Signed)
Got patient into a gown on the monitor did EKG patient is resting with call bell in reach got patient a warm blanket

## 2023-05-18 NOTE — ED Notes (Signed)
PT ambulated to bathroom without incident.

## 2023-05-18 NOTE — Discharge Instructions (Addendum)
You were seen in the emergency department for chest pain and high blood sugars Your blood sugars went down after insulin and IV fluids We gave you a dose of your long-acting insulin (45 units glargine) while you are here in the ED It is very important that you pick up your medications from the pharmacy and take them as directed beginning tomorrow morning If you do not have your medications for diabetes at the pharmacy you need to call your endocrinologist tomorrow morning Return to the emergency department for chest pain, uncontrolled blood sugars or any other concerns

## 2023-05-18 NOTE — ED Provider Notes (Signed)
  Physical Exam  BP (!) 129/92   Pulse 73   Temp 98.4 F (36.9 C) (Oral)   Resp 15   Ht 5\' 8"  (1.727 m)   Wt 105.7 kg   SpO2 100%   BMI 35.43 kg/m   Physical Exam Vitals and nursing note reviewed.  HENT:     Head: Normocephalic and atraumatic.  Eyes:     Pupils: Pupils are equal, round, and reactive to light.  Cardiovascular:     Rate and Rhythm: Normal rate and regular rhythm.  Pulmonary:     Effort: Pulmonary effort is normal.     Breath sounds: Normal breath sounds.  Abdominal:     Palpations: Abdomen is soft.     Tenderness: There is no abdominal tenderness.  Skin:    General: Skin is warm and dry.  Neurological:     Mental Status: She is alert.  Psychiatric:        Mood and Affect: Mood normal.     Procedures  Procedures  ED Course / MDM   Clinical Course as of 05/18/23 2027  Tue May 18, 2023  2023 Point-of-care glucose appropriately downtrending now in 200s.  Reevaluated patient.  Patient reports improvement in her symptoms.  Feels well enough to go home.  While I was in the room she did verify that her diabetes medications are available for pickup at a pharmacy except for Trulicity which will require an authorization.  She will pick these medications up for [facility] tomorrow morning.  We will give her her long-acting insulin which she takes nightly while she is here in the ED and discharge. [MP]    Clinical Course User Index [MP] Royanne Foots, DO   Medical Decision Making I, Estelle June DO, have assumed care of this patient from the previous provider pending reevaluation after 10 units insulin IV fluids and disposition  Amount and/or Complexity of Data Reviewed Labs: ordered. Radiology: ordered.  Risk Prescription drug management.   Final diagnosis Hyperglycemia Poorly controlled type 2 diabetes Costochondritis Chest pain       Royanne Foots, DO 05/18/23 2028

## 2023-05-18 NOTE — ED Provider Notes (Signed)
[place] EMERGENCY DEPARTMENT AT [facility] Provider Note   CSN: [id number] Arrival date & time: 05/18/23  1026     History  Chief Complaint  Patient presents with   Hyperglycemia   Chest Pain    [name] is a 37 y.o. female.   Hyperglycemia Associated symptoms: chest pain   Chest Pain    39 female with medical history significant for asthma, diabetes mellitus, hypertension, presenting to the emergency department with hyperglycemia and chest pain.  The patient states that her Dexcom fell off her arm and she has not been able to check her sugars at home.  She is concern for elevated blood glucose.  Additionally, around 8:00 this morning she started to develop left-sided chest discomfort, described as a dull throbbing sensation that radiates down her left arm.  The pain persist, no aggravating or alleviating factors, no cough, shortness of breath, fever or chills.  No lower extremity swelling, no history of DVT or PE.  Home Medications Prior to Admission medications   Medication Sig Start Date End Date Taking? Authorizing Provider  albuterol (VENTOLIN HFA) 108 (90 Base) MCG/ACT inhaler INHALE 2 PUFFS INTO THE LUNGS EVERY 6 HOURS AS NEEDED FOR WHEEZING OR SHORTNESS OF BREATH 02/12/23  Yes Ngetich, Dinah C, NP  atorvastatin (LIPITOR) 20 MG tablet Take 1 tablet (20 mg total) by mouth daily. 09/15/22  Yes Ngetich, Dinah C, NP  Continuous Blood Gluc Receiver (FREESTYLE LIBRE 3 READER) DEVI 1 Device by Does not apply route in the morning, at noon, in the evening, and at bedtime. Patient taking differently: 1 Device by Does not apply route See admin instructions. Use as directed, changing every 14 days. 09/15/22  Yes Ngetich, Dinah C, NP  Glucagon (GVOKE HYPOPEN 1-PACK) 1 MG/0.2ML SOAJ Inject 1 mg into the skin as needed (low blood sugar with impaired consciousness). 03/03/23  Yes Motwani, Komal, MD  insulin glargine (LANTUS) 100 UNIT/ML injection Inject 45 Units into  the skin at bedtime.   Yes [provider]  insulin isophane & regular human KwikPen (HUMULIN 70/30 KWIKPEN) (70-30) 100 UNIT/ML KwikPen Inject 22 Units into the skin See admin instructions. Before meals.   Yes [provider]  sertraline (ZOLOFT) 50 MG tablet Take 1 tablet (50 mg total) by mouth daily. 04/14/23 04/13/24 Yes Nwoko, Uchenna E, PA  Dulaglutide (TRULICITY) 0.75 MG/0.5ML SOPN Inject 0.75 mg into the skin once a week. Patient not taking: Reported on 05/18/2023 05/11/23   Altamese [place], MD  Insulin Glargine (BASAGLAR KWIKPEN) 100 UNIT/ML Inject 45 Units into the skin daily. Patient not taking: Reported on 05/18/2023 05/11/23 06/13/23  Altamese [place], MD  insulin lispro (HUMALOG) 100 UNIT/ML KwikPen Inject 15 Units into the skin 3 (three) times daily with meals. Patient not taking: Reported on 05/18/2023 05/11/23 08/09/23  Altamese [place], MD  Insulin Pen Needle (AUM MINI INSULIN PEN NEEDLE) 32G X 6 MM MISC Use with Lantus nightly and NPH twice daily 02/16/23     metFORMIN (GLUCOPHAGE) 1000 MG tablet Take 1 tablet (1,000 mg total) by mouth 2 (two) times daily with a meal. 09/13/20 10/14/20  Reva Bores, MD      Allergies    Patient has no known allergies.    Review of Systems   Review of Systems  Respiratory:  Positive for chest tightness.   Cardiovascular:  Positive for chest pain.  All other systems reviewed and are negative.   Physical Exam Updated Vital Signs BP 113/81   Pulse 80  Temp 98.4 F (36.9 C) (Oral)   Resp 16   Ht 5\' 8"  (1.727 m)   Wt 105.7 kg   SpO2 100%   BMI 35.43 kg/m  Physical Exam Vitals and nursing note reviewed.  Constitutional:      General: She is not in acute distress.    Appearance: She is well-developed.  HENT:     Head: Normocephalic and atraumatic.     Mouth/Throat:     Mouth: Mucous membranes are dry.  Eyes:     Conjunctiva/sclera: Conjunctivae normal.  Cardiovascular:     Rate and Rhythm: Regular rhythm. Tachycardia  present.  Pulmonary:     Effort: Pulmonary effort is normal. No respiratory distress.     Breath sounds: Normal breath sounds.  Chest:     Comments: Chest wall tenderness to palpation along the left chest wall that reproduces the patient's discomfort Abdominal:     Palpations: Abdomen is soft.     Tenderness: There is no abdominal tenderness.  Musculoskeletal:        General: No swelling.     Cervical back: Neck supple.  Skin:    General: Skin is warm and dry.     Capillary Refill: Capillary refill takes less than 2 seconds.  Neurological:     Mental Status: She is alert.  Psychiatric:        Mood and Affect: Mood normal.     ED Results / Procedures / Treatments   Labs (all labs ordered are listed, but only abnormal results are displayed) Labs Reviewed  CBC - Abnormal; Notable for the following components:      Result Value   RBC 5.12 (*)    Hemoglobin 10.9 (*)    HCT 34.9 (*)    MCV 68.2 (*)    MCH 21.3 (*)    RDW 17.3 (*)    All other components within normal limits  URINALYSIS, ROUTINE W REFLEX MICROSCOPIC - Abnormal; Notable for the following components:   Color, Urine STRAW (*)    Glucose, UA >=500 (*)    Ketones, ur 20 (*)    Bacteria, UA RARE (*)    All other components within normal limits  BETA-HYDROXYBUTYRIC ACID - Abnormal; Notable for the following components:   Beta-Hydroxybutyric Acid 0.66 (*)    All other components within normal limits  CBG MONITORING, ED - Abnormal; Notable for the following components:   Glucose-Capillary >600 (*)    All other components within normal limits  I-STAT CHEM 8, ED - Abnormal; Notable for the following components:   Sodium 130 (*)    Chloride 95 (*)    Glucose, Bld 641 (*)    Calcium, Ion 1.09 (*)    All other components within normal limits  I-STAT VENOUS BLOOD GAS, ED - Abnormal; Notable for the following components:   pH, Ven 7.524 (*)    pCO2, Ven 24.8 (*)    pO2, Ven 26 (*)    TCO2 21 (*)    Sodium 131 (*)     Calcium, Ion 0.84 (*)    HCT 34.0 (*)    Hemoglobin 11.6 (*)    All other components within normal limits  CBG MONITORING, ED - Abnormal; Notable for the following components:   Glucose-Capillary 393 (*)    All other components within normal limits  HCG, SERUM, QUALITATIVE  LIPASE, BLOOD  D-DIMER, QUANTITATIVE  CBG MONITORING, ED  CBG MONITORING, ED  CBG MONITORING, ED  TROPONIN I (HIGH SENSITIVITY)  TROPONIN I (  HIGH SENSITIVITY)    EKG EKG Interpretation Date/Time:  Tuesday May 18 2023 10:32:44 EDT Ventricular Rate:  85 PR Interval:  158 QRS Duration:  79 QT Interval:  368 QTC Calculation: 438 R Axis:   43  Text Interpretation: Sinus rhythm Confirmed by Ernie Avena ([id number]) on 05/18/2023 11:49:36 AM  Radiology DG Chest 2 View  Result Date: 05/18/2023 CLINICAL DATA:  Shortness of breath.  Chest pain for a day EXAM: CHEST - 2 VIEW COMPARISON:  X-ray 04/08/2023 FINDINGS: The heart size and mediastinal contours are within normal limits. Both lungs are clear. No consolidation, pneumothorax or effusion. No edema. The visualized skeletal structures are unremarkable. Overlapping cardiac leads. IMPRESSION: No acute cardiopulmonary disease. Electronically Signed   By: Karen Kays M.D.   On: 05/18/2023 13:57    Procedures Procedures    Medications Ordered in ED Medications  lactated ringers bolus 1,000 mL (has no administration in time range)  ketorolac (TORADOL) 15 MG/ML injection 15 mg (has no administration in time range)  lactated ringers bolus 2,000 mL (2,000 mLs Intravenous New Bag/Given 05/18/23 1148)  insulin aspart (novoLOG) injection 10 Units (10 Units Subcutaneous Given 05/18/23 1412)    ED Course/ Medical Decision Making/ A&P                                 Medical Decision Making Amount and/or Complexity of Data Reviewed Labs: ordered. Radiology: ordered.  Risk Prescription drug management.    72 female with medical history significant for  asthma, diabetes mellitus, hypertension, presenting to the emergency department with hyperglycemia and chest pain.  The patient states that her Dexcom fell off her arm and she has not been able to check her sugars at home.  She is concern for elevated blood glucose.  Additionally, around 8:00 this morning she started to develop left-sided chest discomfort, described as a dull throbbing sensation that radiates down her left arm.  The pain persist, no aggravating or alleviating factors, no cough, shortness of breath, fever or chills.  No lower extremity swelling, no history of DVT or PE.    On arrival, the patient was afebrile, not tachycardic or tachypneic, BP 124/78, saturating 90% on room air.  On my evaluation, the patient was mildly tachycardic on cardiac telemetry.  Differential diagnosis includes ACS, GERD, PE, pneumothorax, pneumonia, viral infection.  Additionally considered hyperglycemia versus DKA and HHS.  Initial CBG on arrival was 596.  DKA workup initiated.  IV access was obtained and the patient was started on 2 L of Ringer's lactate for fluid resuscitation.  The patient was found to have an alkalosis on VBG with a pH of 7.52, pCO2 of 24.8, HCO3 of 20, i-STAT Chem-8 revealed hyperglycemia to 641, no evidence of AKI with a creatinine of 0.70, normal potassium at 4.0.  hCG was negative, initial cardiac troponin was 2.  Urinalysis revealed mild ketones in the urine and glucosuria without clear evidence of UTI.  CBC was without a leukocytosis, mild anemia to 10.9 noted.  CXR: No acute cardiac or pulmonary disease.  EKG: Sinus rhythm, ventricular 85, no acute ischemic changes noted or abnormal intervals.  Remaining cardiac workup, the patient has reproducible chest wall tenderness to palpation, her laboratory workup revealed a normal D-dimer at 0.37 with a normal D-dimer, low concern for PE, cardiac enzymes were normal x 2, lipase was also normal.  Patient blood glucose was appropriately  downtrending to 393, plan to reassess  the patient, continue to administer fluids and insulin as needed, likely plan for discharge with continued appropriate downtrending of the patient's blood glucose. Signout given to Dr. Elayne Snare at [phone].   Final Clinical Impression(s) / ED Diagnoses Final diagnoses:  Chest pain, unspecified type  Hyperglycemia  Costochondritis    Rx / DC Orders ED Discharge Orders     None         Ernie Avena, MD 05/18/23 1600

## 2023-05-18 NOTE — Inpatient Diabetes Management (Signed)
Inpatient Diabetes Program Recommendations  AACE/ADA: New Consensus Statement on Inpatient Glycemic Control (2015)  Target Ranges:  Prepandial:   less than 140 mg/dL      Peak postprandial:   less than 180 mg/dL (1-2 hours)      Critically ill patients:  140 - 180 mg/dL   Lab Results  Component Value Date   GLUCAP 393 (H) 05/18/2023   HGBA1C 15.0 (A) 05/11/2023    Review of Glycemic Control  Latest Reference Range & Units 05/18/23 10:39 05/18/23 13:45  Glucose-Capillary 70 - 99 mg/dL >433 (HH) 295 (H)  (HH): Data is critically high (H): Data is abnormally high  Diabetes history: DM2 Outpatient Diabetes medications: Lantus 45 units every day, Novolog 15-20 units TID, Trulicity weekly ENDO Dr. Roosevelt Locks with [facility]- last visit was 03/02/22 Current orders for Inpatient glycemic control: IVF, Novolog 10 units x 1  Spoke with patient at bedside.  She confirms above home medications.  She occasionally forgets to take her insulin.  Her FSL 3 fell off (she states this happens a lot).  She does not have any strips for her glucometer at home.  She states she has been under a lot of stress and is very short on money.  She has Medicaid; explained that her Medicaid should cover her strips and if her FSL 3 falls off she can call [employer] and request a replacement.  Discussed options to help keep her FSL 3 in place such as clean, dry skin and skin prep such as Skin Tac.  She re-downloaded the FSL 3 app on her IPhone.  Placed the CGM on the back of her left arm and covered with a Tegaderm.  Provided her with some extra Tegaderm's and and extra CGM.    Reviewed patient's current A1c of 15%. Explained what a A1c is and what it measures. Also reviewed goal A1c with patient, importance of good glucose control @ home, and blood sugar goals.  Educated on The Plate Method, CHO's, portion control, CBGs at home fasting and mid afternoon, F/U with PCP every 3 months, bring meter to PCP office, long and short term  complications of uncontrolled BG, and importance of exercise.  She will follow up with her endocrinologist.  Will continue to follow while inpatient.  Thank you, Dulce Sellar, MSN, CDCES Diabetes Coordinator Inpatient Diabetes Program [phone] (team pager from 8a-5p)

## 2023-05-18 NOTE — ED Triage Notes (Signed)
Pt BIB [facility] from home d/t hyperglycemia & CP. States her Lt sided CP started 1 hr ago, does radiate into her Lt arm & her Dexcon fell off her arm a few days ago & she cannot find her other one to replace it & reports has not been able to check her sugar at home. A/Ox4, 12L good, CBG 596 & BP was 137/70.

## 2023-05-19 ENCOUNTER — Other Ambulatory Visit (HOSPITAL_COMMUNITY): Payer: Self-pay

## 2023-05-19 LAB — CBG MONITORING, ED
Glucose-Capillary: 301 mg/dL — ABNORMAL HIGH (ref 70–99)
Glucose-Capillary: 293 mg/dL — ABNORMAL HIGH (ref 70–99)
Glucose-Capillary: 275 mg/dL — ABNORMAL HIGH (ref 70–99)

## 2023-05-26 ENCOUNTER — Other Ambulatory Visit (HOSPITAL_COMMUNITY): Payer: Self-pay

## 2023-05-26 ENCOUNTER — Telehealth: Payer: Self-pay

## 2023-05-26 NOTE — Telephone Encounter (Signed)
Trulicity is requiring a prior auth please advise

## 2023-05-27 ENCOUNTER — Ambulatory Visit: Payer: Managed Care, Other (non HMO)

## 2023-05-27 ENCOUNTER — Other Ambulatory Visit (HOSPITAL_COMMUNITY): Payer: Self-pay

## 2023-05-27 ENCOUNTER — Other Ambulatory Visit: Payer: Self-pay

## 2023-05-27 ENCOUNTER — Telehealth: Payer: Self-pay | Admitting: "Endocrinology

## 2023-05-27 VITALS — BP 110/86 | HR 89 | Ht 68.0 in | Wt 242.6 lb

## 2023-05-27 DIAGNOSIS — B009 Herpesviral infection, unspecified: Secondary | ICD-10-CM

## 2023-05-27 MED ORDER — VALACYCLOVIR HCL 1 G PO TABS
1000.0000 mg | ORAL_TABLET | Freq: Every day | ORAL | 2 refills | Status: AC
Start: 2023-05-27 — End: ?
  Filled 2023-05-27: qty 5, 5d supply, fill #0
  Filled 2023-06-08: qty 5, 5d supply, fill #1
  Filled 2023-06-16: qty 5, 5d supply, fill #2

## 2023-05-27 NOTE — Progress Notes (Signed)
Pt here today with c/o of

## 2023-05-27 NOTE — Telephone Encounter (Signed)
Form is upfront in the folder waiting for patient pick up -

## 2023-05-27 NOTE — Addendum Note (Signed)
Addended by: Milas Hock A on: 05/27/2023 10:57 AM   Modules accepted: Orders

## 2023-05-27 NOTE — Telephone Encounter (Signed)
Patient came in to office today and brought a Pension scheme manager Request for Information (RFI) form-Operations" to be completed.  [employer] form is in Dr. Grafton Folk folder in the front office>

## 2023-05-27 NOTE — Progress Notes (Signed)
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  Per Dr.Duncan pt can have medication for treatment.  Pt advised to schedule annual exam at the front office and to contact the office with concerns.  Pt verbalized understanding.   Addison Naegeli, RN  05/27/23

## 2023-05-31 ENCOUNTER — Other Ambulatory Visit (HOSPITAL_COMMUNITY): Payer: Self-pay

## 2023-05-31 ENCOUNTER — Telehealth: Payer: Self-pay

## 2023-05-31 NOTE — Telephone Encounter (Signed)
Pharmacy Patient Advocate Encounter   Received notification from Pt Calls Messages that prior authorization for Trulicity is required/requested.   Insurance verification completed.   The patient is insured through [employer] [place] and [address].   Per test claim: PA required; PA submitted to [employer] via CoverMyMeds Key/confirmation #/EOC [id number] Status is pending

## 2023-06-01 NOTE — Telephone Encounter (Signed)
I have spoke to [name] and she is upset that her paperwork is not filled out by Dr Roosevelt Locks she states that her diabetes is the reason she needs to take breaks at work and she is asking for a transition of care

## 2023-06-01 NOTE — Telephone Encounter (Signed)
Patient came in on 05/31/23 to pick up form below- was not in the accordion file folder at the front desk.  Soledad Gerlach look as well and could not locate. Soledad Gerlach got a copy of the form and placed it on Dr. Grafton Folk desk marked urgent for completion.  Patient was upset and will be returning on 06/01/23 to get completed form

## 2023-06-01 NOTE — Telephone Encounter (Signed)
I filled out the paperwork for her and she came and picked it up this afternoon

## 2023-06-02 ENCOUNTER — Other Ambulatory Visit (HOSPITAL_COMMUNITY): Payer: Self-pay

## 2023-06-02 ENCOUNTER — Emergency Department (HOSPITAL_COMMUNITY)
Admission: EM | Admit: 2023-06-02 | Discharge: 2023-06-02 | Disposition: A | Discharge status: Home / Self Care | Payer: Commercial Managed Care - HMO | Attending: Emergency Medicine | Admitting: Emergency Medicine

## 2023-06-02 ENCOUNTER — Other Ambulatory Visit: Payer: Self-pay

## 2023-06-02 ENCOUNTER — Emergency Department (HOSPITAL_COMMUNITY): Payer: Commercial Managed Care - HMO

## 2023-06-02 DIAGNOSIS — Z794 Long term (current) use of insulin: Secondary | ICD-10-CM | POA: Diagnosis not present

## 2023-06-02 DIAGNOSIS — M25551 Pain in right hip: Secondary | ICD-10-CM

## 2023-06-02 DIAGNOSIS — M25559 Pain in unspecified hip: Secondary | ICD-10-CM | POA: Diagnosis present

## 2023-06-02 DIAGNOSIS — M62830 Muscle spasm of back: Secondary | ICD-10-CM | POA: Insufficient documentation

## 2023-06-02 DIAGNOSIS — M545 Low back pain, unspecified: Secondary | ICD-10-CM

## 2023-06-02 LAB — URINALYSIS, ROUTINE W REFLEX MICROSCOPIC
Bacteria, UA: NONE SEEN
Bilirubin Urine: NEGATIVE
Glucose, UA: >=500 mg/dL — AB
Hgb urine dipstick: NEGATIVE
Ketones, ur: NEGATIVE mg/dL
Leukocytes,Ua: NEGATIVE
Nitrite: NEGATIVE
Protein, ur: NEGATIVE mg/dL
Specific Gravity, Urine: 1.016 (ref 1.005–1.030)
pH: 7 (ref 5.0–8.0)

## 2023-06-02 LAB — PREGNANCY, URINE: Preg Test, Ur: NEGATIVE

## 2023-06-02 MED ORDER — OXYCODONE-ACETAMINOPHEN 5-325 MG PO TABS
1.0000 | ORAL_TABLET | Freq: Once | ORAL | Status: AC
  Administered 2023-06-02: 1 via ORAL
  Filled 2023-06-02: qty 1

## 2023-06-02 MED ORDER — LIDOCAINE 5 % EX PTCH
1.0000 | MEDICATED_PATCH | CUTANEOUS | Status: DC
  Administered 2023-06-02: 1 via TRANSDERMAL
  Filled 2023-06-02: qty 1

## 2023-06-02 MED ORDER — LIDOCAINE 5 % EX PTCH: 1.0000 | MEDICATED_PATCH | CUTANEOUS | 0 refills | Status: DC

## 2023-06-02 MED ORDER — OXYCODONE-ACETAMINOPHEN 5-325 MG PO TABS
1.0000 | ORAL_TABLET | ORAL | 0 refills | Status: DC | PRN
Start: 2023-06-02 — End: 2024-03-23

## 2023-06-02 MED ORDER — CYCLOBENZAPRINE HCL 10 MG PO TABS: 10.0000 mg | ORAL_TABLET | Freq: Two times a day (BID) | ORAL | 0 refills | Status: DC | PRN

## 2023-06-02 MED ORDER — CYCLOBENZAPRINE HCL 10 MG PO TABS
10.0000 mg | ORAL_TABLET | Freq: Once | ORAL | Status: AC
  Administered 2023-06-02: 10 mg via ORAL
  Filled 2023-06-02: qty 1

## 2023-06-02 NOTE — Discharge Instructions (Signed)
Your history, exam, workup today are consistent with muscle spasm and musculoskeletal low back pain going towards the hip.  The x-rays did not show acute bony injury or fractures and the urinalysis did not show infection.  As the medications seem to help, we feel you are safe for discharge home to follow-up with your primary doctor or an outpatient back doctor.  Please rest and stay hydrated.  May also use over-the-counter anti-inflammatory medications and rest.  If any symptoms change or worsen acutely, return to the nearest emergency department.  Given your reassuring exam, we agreed to hold on advanced imaging such as CT or MRI.

## 2023-06-02 NOTE — ED Triage Notes (Signed)
Pt. Stated, Im having right back side of my hip that started 4 days ago. Denies any injuries.

## 2023-06-02 NOTE — ED Notes (Signed)
Pt says the hip pain shoots across her back. Pt denies numbness and tingling. Pt says she is able to use restroom without problems.

## 2023-06-02 NOTE — ED Notes (Signed)
Provided pt two warm blankets and pillow.

## 2023-06-02 NOTE — ED Provider Notes (Signed)
[place] EMERGENCY DEPARTMENT AT [facility] Provider Note   CSN: [id number] Arrival date & time: 06/02/23  8295     History  Chief Complaint  Patient presents with   Hip Pain    [name] is a 37 y.o. female.  The history is provided by the patient, medical records and the spouse. No language interpreter was used.  Hip Pain This is a new problem. The current episode started more than 2 days ago. The problem occurs constantly. The problem has not changed since onset.Pertinent negatives include no chest pain, no abdominal pain, no headaches and no shortness of breath. The symptoms are aggravated by standing. Nothing relieves the symptoms. She has tried nothing for the symptoms. The treatment provided no relief.       Home Medications Prior to Admission medications   Medication Sig Start Date End Date Taking? Authorizing Provider  albuterol (VENTOLIN HFA) 108 (90 Base) MCG/ACT inhaler INHALE 2 PUFFS INTO THE LUNGS EVERY 6 HOURS AS NEEDED FOR WHEEZING OR SHORTNESS OF BREATH 02/12/23   Ngetich, Dinah C, NP  atorvastatin (LIPITOR) 20 MG tablet Take 1 tablet (20 mg total) by mouth daily. 09/15/22   Ngetich, Dinah C, NP  Continuous Blood Gluc Receiver (FREESTYLE LIBRE 3 READER) DEVI 1 Device by Does not apply route in the morning, at noon, in the evening, and at bedtime. Patient taking differently: 1 Device by Does not apply route See admin instructions. Use as directed, changing every 14 days. 09/15/22   Ngetich, Dinah C, NP  Dulaglutide (TRULICITY) 0.75 MG/0.5ML SOAJ Inject 0.75 mg into the skin once a week. Patient not taking: Reported on 05/18/2023 05/11/23   Altamese Wrens, MD  Glucagon (GVOKE HYPOPEN 1-PACK) 1 MG/0.2ML SOAJ Inject 1 mg into the skin as needed (low blood sugar with impaired consciousness). 03/03/23   Altamese Tillman, MD  Insulin Glargine (BASAGLAR KWIKPEN) 100 UNIT/ML Inject 45 Units into the skin daily. Patient not taking: Reported on 05/18/2023  05/11/23 06/21/23  Altamese Rowley, MD  insulin glargine (LANTUS) 100 UNIT/ML injection Inject 45 Units into the skin at bedtime.    [provider]  insulin isophane & regular human KwikPen (HUMULIN 70/30 KWIKPEN) (70-30) 100 UNIT/ML KwikPen Inject 22 Units into the skin See admin instructions. Before meals.    [provider]  insulin lispro (HUMALOG) 100 UNIT/ML KwikPen Inject 15 Units into the skin 3 (three) times daily with meals. 05/11/23 08/14/23  Altamese Taopi, MD  Insulin Pen Needle (AUM MINI INSULIN PEN NEEDLE) 32G X 6 MM MISC Use with Lantus nightly and NPH twice daily 02/16/23     sertraline (ZOLOFT) 50 MG tablet Take 1 tablet (50 mg total) by mouth daily. 04/14/23 04/13/24  Nwoko, Tommas Olp, PA  valACYclovir (VALTREX) 1000 MG tablet Take 1 tablet (1,000 mg total) by mouth daily for 5 days 05/27/23   Milas Hock, MD  metFORMIN (GLUCOPHAGE) 1000 MG tablet Take 1 tablet (1,000 mg total) by mouth 2 (two) times daily with a meal. 09/13/20 10/14/20  Reva Bores, MD      Allergies    Patient has no known allergies.    Review of Systems   Review of Systems  Constitutional:  Negative for chills, fatigue and fever.  HENT:  Negative for congestion.   Respiratory:  Negative for cough, chest tightness and shortness of breath.   Cardiovascular:  Negative for chest pain, palpitations and leg swelling.  Gastrointestinal:  Negative for abdominal pain, constipation, diarrhea, nausea and vomiting.  Genitourinary:  Negative for dysuria and flank pain.  Musculoskeletal:  Positive for back pain. Negative for neck pain and neck stiffness.  Skin:  Negative for rash and wound.  Neurological:  Negative for speech difficulty, weakness, light-headedness, numbness and headaches.  Psychiatric/Behavioral:  Negative for agitation.   All other systems reviewed and are negative.   Physical Exam Updated Vital Signs BP 120/76 (BP Location: Left Arm)   Pulse 78   Temp 97.9 F (36.6 C) (Oral)    Resp 16   LMP 05/12/2023   SpO2 100%  Physical Exam Vitals and nursing note reviewed.  Constitutional:      General: She is not in acute distress.    Appearance: She is well-developed. She is not ill-appearing, toxic-appearing or diaphoretic.  HENT:     Head: Normocephalic and atraumatic.     Mouth/Throat:     Mouth: Mucous membranes are moist.     Pharynx: No oropharyngeal exudate.  Eyes:     Extraocular Movements: Extraocular movements intact.     Conjunctiva/sclera: Conjunctivae normal.     Pupils: Pupils are equal, round, and reactive to light.  Cardiovascular:     Rate and Rhythm: Normal rate and regular rhythm.     Pulses: Normal pulses.     Heart sounds: No murmur heard. Pulmonary:     Effort: Pulmonary effort is normal. No respiratory distress.     Breath sounds: Normal breath sounds. No wheezing, rhonchi or rales.  Chest:     Chest wall: No tenderness.  Abdominal:     General: Abdomen is flat.     Palpations: Abdomen is soft.     Tenderness: There is no abdominal tenderness. There is no right CVA tenderness, left CVA tenderness, guarding or rebound.  Musculoskeletal:        General: Tenderness present. No swelling.     Cervical back: Neck supple. No spasms or tenderness.     Thoracic back: No spasms or tenderness.     Lumbar back: Spasms and tenderness present. No signs of trauma or bony tenderness.       Back:     Right lower leg: No edema.     Left lower leg: No edema.     Comments: Paraspinal lumbar tenderness and spasm.  Tenderness in the right hip area.  Intact sensation, strength, and pulses distally.  She reports some chronic neuropathy that is not different.  Pain did not shoot down the leg when legs raised.  Skin:    General: Skin is warm and dry.     Capillary Refill: Capillary refill takes less than 2 seconds.     Findings: No erythema or rash.  Neurological:     General: No focal deficit present.     Mental Status: She is alert.     Sensory: No  sensory deficit.     Motor: No weakness.  Psychiatric:        Mood and Affect: Mood normal.     ED Results / Procedures / Treatments   Labs (all labs ordered are listed, but only abnormal results are displayed) Labs Reviewed  URINALYSIS, ROUTINE W REFLEX MICROSCOPIC - Abnormal; Notable for the following components:      Result Value   Color, Urine STRAW (*)    Glucose, UA >=500 (*)    All other components within normal limits  PREGNANCY, URINE    EKG None  Radiology DG Lumbar Spine Complete  Result Date: 06/02/2023 CLINICAL DATA:  Right lower back pain.  No known injury. EXAM: LUMBAR SPINE - COMPLETE 5 VIEW COMPARISON:  CT hip dated 08/31/2018 FINDINGS: There is no evidence of lumbar spine fracture. Straightening of the lumbar lordosis. Intervertebral disc spaces are maintained. 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 IMPRESSION: 1. No acute fracture or traumatic listhesis. 2. Straightening of the lumbar lordosis, which may be positional or related to muscle spasm. Electronically Signed   By: Agustin Cree M.D.   On: 06/02/2023 14:46   DG Hip Unilat W or Wo Pelvis 2-3 Views Right  Result Date: 06/02/2023 CLINICAL DATA:  Right hip pain.  Low back pain. EXAM: DG HIP (WITH OR WITHOUT PELVIS) 2-3V RIGHT COMPARISON:  None Available. FINDINGS: Pelvis is intact with normal and symmetric sacroiliac joints. No acute fracture or dislocation. No aggressive osseous lesion. Visualized sacral arcuate lines are unremarkable. Unremarkable symphysis pubis. Unremarkable bilateral hip joints. No radiopaque foreign bodies. IMPRESSION: *No acute osseous abnormality of the pelvis or right hip joint. Electronically Signed   By: Jules Schick M.D.   On: 06/02/2023 14:43    Procedures Procedures    Medications Ordered in ED Medications  lidocaine (LIDODERM) 5 % 1 patch (1 patch Transdermal Patch Applied 06/02/23 1038)   oxyCODONE-acetaminophen (PERCOCET/ROXICET) 5-325 MG per tablet 1 tablet (1 tablet Oral Given 06/02/23 1040)  cyclobenzaprine (FLEXERIL) tablet 10 mg (10 mg Oral Given 06/02/23 1040)    ED Course/ Medical Decision Making/ A&P                                 Medical Decision Making Amount and/or Complexity of Data Reviewed Labs: ordered. Radiology: ordered.  Risk Prescription drug management.    SHANITRA TELLER is a 37 y.o. female with past medical history significant for diabetes, chronic neuropathies, anxiety, asthma, fibroid, and hypertension who presents with back and hip pain.  According to patient, for the last 4 days she has had pain in her low back and her right hip.  She reports she thinks it started after she lifted a heavy basket.  She has never had pain like this before and denied other focal trauma.  Denies any injuries or twisting.  She denies any car accident.  She denies any new numbness or weakness of her legs.  Denies loss of bowel or bladder control.  Denies any dysuria, hematuria, or urinary changes.  Denies vaginal symptoms.  Denies any fevers, chills, congestion, cough, nausea, vomiting, constipation, or diarrhea.  No anterior abdominal pain.  No rashes reported.  She reports it hurts to stand or walk.  She reports the pain is aching and sharp at times and different positions make it feel better.  On exam, lungs clear.  Chest nontender.  Abdomen nontender.  Patient had intact sensation and strength in legs and raising her legs up did not cause pain in the back to worsen or shoot down her legs.  She had tenderness in the right hip area and tenderness across her low back primarily in the paraspinal area.  There was some spasm palpated.  Midline nontender and her thoracic and cervical back were nontender.  Patient otherwise resting while hurting in her back.  She reports it is 10 out of 10 in severity.  Clinically expect this is a muscle spasm from bending over to get  that basket when the pain began.  Will give Lidoderm patch, muscle relaxant, and will give  in a pain pill while were getting some x-rays we will get x-rays of her lumbar spine and her right hip although with low suspicion for bony injury.  Will also get urinalysis given the pain going towards her bilateral back to make sure is not a UTI causing pyelonephritis.  Low suspicion for this.  Will check pregnancy test as well.  Anticipate discharge with outpatient prescriptions and outpatient follow-up if workup reassuring.  We agreed to hold on advanced imaging such as CT or MRI at this time given her lack of focal red flags  3:14 PM Workup returned without evidence of acute bony injury.  No fracture seen.  No abnormality in the pelvis or hip x-ray.  After medications he is feeling better.  Patient laying on her back without difficulty.  Will discharge home with the medications and have her follow-up with outpatient back doctor.  Urinalysis did not show UTI and she is not pregnant.  Patient and family agree with plan of care and patient will be discharged for outpatient follow-up.         Final Clinical Impression(s) / ED Diagnoses Final diagnoses:  Acute bilateral low back pain without sciatica  Muscle spasm of back  Right hip pain    Rx / DC Orders ED Discharge Orders          Ordered    oxyCODONE-acetaminophen (PERCOCET/ROXICET) 5-325 MG tablet  Every 4 hours PRN        06/02/23 1516    cyclobenzaprine (FLEXERIL) 10 MG tablet  2 times daily PRN        06/02/23 1516    lidocaine (LIDODERM) 5 %  Every 24 hours        06/02/23 1516           Clinical Impression: 1. Acute bilateral low back pain without sciatica   2. Muscle spasm of back   3. Right hip pain     Disposition: Discharge  Condition: Good  I have discussed the results, Dx and Tx plan with the pt(& family if present). [name] expressed understanding and agree(s) with the plan. Discharge instructions discussed at  great length. Strict return precautions discussed and pt &/or family have verbalized understanding of the instructions. No further questions at time of discharge.    New Prescriptions   CYCLOBENZAPRINE (FLEXERIL) 10 MG TABLET    Take 1 tablet (10 mg total) by mouth 2 (two) times daily as needed for muscle spasms.   LIDOCAINE (LIDODERM) 5 %    Place 1 patch onto the skin daily. Remove & Discard patch within 12 hours or as directed by MD   OXYCODONE-ACETAMINOPHEN (PERCOCET/ROXICET) 5-325 MG TABLET    Take 1 tablet by mouth every 4 (four) hours as needed for severe pain (pain score 7-10).    Follow Up: [place], [facility] [address] [place] [address] [phone]     [facility] Ed Emergency Department at [facility] [address] [place] [address] [phone]       Thayne Cindric, Canary Brim, MD 06/02/23 [phone]

## 2023-06-03 ENCOUNTER — Other Ambulatory Visit (HOSPITAL_COMMUNITY): Payer: Self-pay

## 2023-06-03 MED ORDER — METHOCARBAMOL 750 MG PO TABS
750.0000 mg | ORAL_TABLET | Freq: Three times a day (TID) | ORAL | 1 refills | Status: DC | PRN
Start: 2023-06-03 — End: 2023-06-22
  Filled 2023-06-03 – 2023-06-04 (×2): qty 90, 30d supply, fill #0

## 2023-06-03 MED ORDER — PREGABALIN 50 MG PO CAPS
50.0000 mg | ORAL_CAPSULE | Freq: Three times a day (TID) | ORAL | 1 refills | Status: DC
Start: 2023-06-03 — End: 2024-01-07
  Filled 2023-06-03: qty 90, 30d supply, fill #0

## 2023-06-04 ENCOUNTER — Other Ambulatory Visit (HOSPITAL_COMMUNITY): Payer: Self-pay

## 2023-06-04 NOTE — Telephone Encounter (Signed)
[name] is aware

## 2023-06-04 NOTE — Telephone Encounter (Signed)
Pharmacy Patient Advocate Encounter  Received notification from [employer] that Prior Authorization for Trulicity has been APPROVED through 05/29/2024   PA #/Case ID/Reference #: [id number]

## 2023-06-08 ENCOUNTER — Other Ambulatory Visit: Payer: Self-pay

## 2023-06-09 ENCOUNTER — Encounter (HOSPITAL_COMMUNITY): Payer: Self-pay | Admitting: Emergency Medicine

## 2023-06-09 ENCOUNTER — Other Ambulatory Visit (HOSPITAL_COMMUNITY): Payer: Self-pay

## 2023-06-09 ENCOUNTER — Other Ambulatory Visit: Payer: Self-pay

## 2023-06-09 ENCOUNTER — Emergency Department (HOSPITAL_COMMUNITY)
Admission: EM | Admit: 2023-06-09 | Discharge: 2023-06-09 | Disposition: A | Discharge status: Home / Self Care | Payer: Commercial Managed Care - HMO | Attending: Student | Admitting: Student

## 2023-06-09 DIAGNOSIS — M5441 Lumbago with sciatica, right side: Secondary | ICD-10-CM | POA: Diagnosis not present

## 2023-06-09 DIAGNOSIS — Z7984 Long term (current) use of oral hypoglycemic drugs: Secondary | ICD-10-CM | POA: Insufficient documentation

## 2023-06-09 DIAGNOSIS — J45909 Unspecified asthma, uncomplicated: Secondary | ICD-10-CM | POA: Insufficient documentation

## 2023-06-09 DIAGNOSIS — E119 Type 2 diabetes mellitus without complications: Secondary | ICD-10-CM | POA: Diagnosis not present

## 2023-06-09 DIAGNOSIS — Z79899 Other long term (current) drug therapy: Secondary | ICD-10-CM | POA: Diagnosis not present

## 2023-06-09 DIAGNOSIS — Z794 Long term (current) use of insulin: Secondary | ICD-10-CM | POA: Insufficient documentation

## 2023-06-09 DIAGNOSIS — I10 Essential (primary) hypertension: Secondary | ICD-10-CM | POA: Diagnosis not present

## 2023-06-09 DIAGNOSIS — M545 Low back pain, unspecified: Secondary | ICD-10-CM | POA: Diagnosis present

## 2023-06-09 LAB — POC URINE PREG, ED: Preg Test, Ur: NEGATIVE

## 2023-06-09 MED ORDER — LIDOCAINE 5 % EX PTCH
1.0000 | MEDICATED_PATCH | CUTANEOUS | Status: DC
  Administered 2023-06-09: 1 via TRANSDERMAL
  Filled 2023-06-09: qty 1

## 2023-06-09 MED ORDER — KETOROLAC TROMETHAMINE 15 MG/ML IJ SOLN
15.0000 mg | Freq: Once | INTRAMUSCULAR | Status: AC
  Administered 2023-06-09: 15 mg via INTRAMUSCULAR
  Filled 2023-06-09: qty 1

## 2023-06-09 MED ORDER — METHOCARBAMOL 500 MG PO TABS
500.0000 mg | ORAL_TABLET | Freq: Once | ORAL | Status: DC
  Filled 2023-06-09: qty 1

## 2023-06-09 MED ORDER — METHOCARBAMOL 500 MG PO TABS
500.0000 mg | ORAL_TABLET | Freq: Two times a day (BID) | ORAL | 0 refills | Status: DC
Start: 2023-06-09 — End: 2024-03-23
  Filled 2023-06-09: qty 20, 10d supply, fill #0

## 2023-06-09 NOTE — ED Triage Notes (Signed)
Pt here from work with c/o lower back pain after turning and lifting a box at work , pt does go to a pain clinic

## 2023-06-09 NOTE — ED Provider Notes (Signed)
EMERGENCY DEPARTMENT AT [facility] Provider Note   CSN: [id number] Arrival date & time: 06/09/23  5621     History  Chief Complaint  Patient presents with   Back Pain    [name] is a 37 y.o. female with a past medical history significant for hypertension, asthma, diabetes, and chronic low back pain who presents to the ED due to acute on chronic low back pain.  Patient admits to some numbness/tingling to right lower extremity.  Denies saddle anesthesia, bowel/bladder incontinence, lower extremity weakness, fever/chills, and IV drug use.  Patient states she injured her back when she was bending over reaching for boxes in a bin.  No direct injury to low back.  No urinary symptoms.  Denies abdominal pain.  Takes oxycodone for her chronic low back pain.  History obtained from patient and past medical records. No interpreter used during encounter.       Home Medications Prior to Admission medications   Medication Sig Start Date End Date Taking? Authorizing Provider  methocarbamol (ROBAXIN) 500 MG tablet Take 1 tablet (500 mg total) by mouth 2 (two) times daily. 06/09/23  Yes Sanjeev Main C, PA-C  albuterol (VENTOLIN HFA) 108 (90 Base) MCG/ACT inhaler INHALE 2 PUFFS INTO THE LUNGS EVERY 6 HOURS AS NEEDED FOR WHEEZING OR SHORTNESS OF BREATH 02/12/23   Ngetich, Dinah C, NP  atorvastatin (LIPITOR) 20 MG tablet Take 1 tablet (20 mg total) by mouth daily. 09/15/22   Ngetich, Dinah C, NP  Continuous Blood Gluc Receiver (FREESTYLE LIBRE 3 READER) DEVI 1 Device by Does not apply route in the morning, at noon, in the evening, and at bedtime. Patient taking differently: 1 Device by Does not apply route See admin instructions. Use as directed, changing every 14 days. 09/15/22   Ngetich, Dinah C, NP  cyclobenzaprine (FLEXERIL) 10 MG tablet Take 1 tablet (10 mg total) by mouth 2 (two) times daily as needed for muscle spasms. 06/02/23   Tegeler, Canary Brim, MD   Dulaglutide (TRULICITY) 0.75 MG/0.5ML SOAJ Inject 0.75 mg into the skin once a week. 05/11/23   Motwani, Carin Hock, MD  Glucagon (GVOKE HYPOPEN 1-PACK) 1 MG/0.2ML SOAJ Inject 1 mg into the skin as needed (low blood sugar with impaired consciousness). 03/03/23   Altamese Bradford, MD  Insulin Glargine (BASAGLAR KWIKPEN) 100 UNIT/ML Inject 45 Units into the skin daily. 05/11/23 07/24/23  Motwani, Carin Hock, MD  insulin glargine (LANTUS) 100 UNIT/ML injection Inject 45 Units into the skin at bedtime.    [provider]  insulin isophane & regular human KwikPen (HUMULIN 70/30 KWIKPEN) (70-30) 100 UNIT/ML KwikPen Inject 22 Units into the skin See admin instructions. Before meals.    [provider]  insulin lispro (HUMALOG) 100 UNIT/ML KwikPen Inject 15 Units into the skin 3 (three) times daily with meals. 05/11/23 08/14/23  Altamese Akron, MD  Insulin Pen Needle (AUM MINI INSULIN PEN NEEDLE) 32G X 6 MM MISC Use with Lantus nightly and NPH twice daily 02/16/23     lidocaine (LIDODERM) 5 % Place 1 patch onto the skin daily. Remove & Discard patch within 12 hours or as directed by MD 06/02/23   Tegeler, Canary Brim, MD  methocarbamol (ROBAXIN) 750 MG tablet Take 1 tablet (750 mg total) by mouth 3 (three) times daily as needed for muscle spasms. 06/03/23     oxyCODONE-acetaminophen (PERCOCET/ROXICET) 5-325 MG tablet Take 1 tablet by mouth every 4 (four) hours as needed for severe pain (pain score 7-10). 06/02/23  Tegeler, Canary Brim, MD  pregabalin (LYRICA) 50 MG capsule Take 1 capsule (50 mg total) by mouth 3 (three) times daily. 06/03/23     sertraline (ZOLOFT) 50 MG tablet Take 1 tablet (50 mg total) by mouth daily. 04/14/23 04/13/24  Nwoko, Tommas Olp, PA  valACYclovir (VALTREX) 1000 MG tablet Take 1 tablet (1,000 mg total) by mouth daily for 5 days 05/27/23   Milas Hock, MD  metFORMIN (GLUCOPHAGE) 1000 MG tablet Take 1 tablet (1,000 mg total) by mouth 2 (two) times daily with a meal. 09/13/20  10/14/20  Reva Bores, MD      Allergies    Patient has no known allergies.    Review of Systems   Review of Systems  Constitutional:  Negative for fever.  Musculoskeletal:  Positive for back pain.    Physical Exam Updated Vital Signs BP 117/81   Pulse 74   Temp 98.2 F (36.8 C)   Resp 18   LMP 05/12/2023 (Approximate) Comment: Pregnancy test waiver signed on 06/02/2023  SpO2 100%  Physical Exam Vitals and nursing note reviewed.  Constitutional:      General: She is not in acute distress.    Appearance: She is not ill-appearing.  HENT:     Head: Normocephalic.  Eyes:     Pupils: Pupils are equal, round, and reactive to light.  Cardiovascular:     Rate and Rhythm: Normal rate and regular rhythm.     Pulses: Normal pulses.     Heart sounds: Normal heart sounds. No murmur heard.    No friction rub. No gallop.  Pulmonary:     Effort: Pulmonary effort is normal.     Breath sounds: Normal breath sounds.  Abdominal:     General: Abdomen is flat. There is no distension.     Palpations: Abdomen is soft.     Tenderness: There is no abdominal tenderness. There is no guarding or rebound.  Musculoskeletal:        General: Normal range of motion.     Cervical back: Neck supple.     Comments: No thoracic midline tenderness. Mild lumbar midline tenderness. Reproducible right sided lumbar paraspinal tenderness. Bilateral lower extremities neurovascularly intact.  Skin:    General: Skin is warm and dry.  Neurological:     General: No focal deficit present.     Mental Status: She is alert.  Psychiatric:        Mood and Affect: Mood normal.        Behavior: Behavior normal.     ED Results / Procedures / Treatments   Labs (all labs ordered are listed, but only abnormal results are displayed) Labs Reviewed  POC URINE PREG, ED    EKG None  Radiology No results found.  Procedures Procedures    Medications Ordered in ED Medications  ketorolac (TORADOL) 15 MG/ML  injection 15 mg (has no administration in time range)  methocarbamol (ROBAXIN) tablet 500 mg (has no administration in time range)  lidocaine (LIDODERM) 5 % 1 patch (has no administration in time range)    ED Course/ Medical Decision Making/ A&P                                 Medical Decision Making Risk Prescription drug management.   37 year old female presents to the ED due to acute on chronic low back pain.  Patient injured low back when reaching in a bin for boxes.  No direct injury to low back. Admits to intermittent numbness/tingling to RLE. Denies saddle anesthesia, bowel/bladder incontinence, lower extremity weakness, fever/chills, and IV drug use.  Upon arrival, vitals all within normal limits.  Patient in no acute distress.  Very mild lumbar midline tenderness.  Reproducible tenderness in right lumbar paraspinal region.  Bilateral lower extremities neurovascularly intact.  Low suspicion for cauda equina or central cord compression.  No infectious symptoms to suggest infectious etiology.  Patient had lumbar x-ray on 10/30 which was unremarkable.  Patient given Toradol, Robaxin, and Lidoderm patch here in the ED with improvement in pain. Patient able to ambulate without difficulty. Patient discharged with Robaxin.  Has oxycodone at home through pain management.  Neurosurgery number given to patient at discharge and advised to call to schedule an appointment for further evaluation due to chronic low back pain. Patient stable for discharge. Strict ED precautions discussed with patient. Patient states understanding and agrees to plan. Patient discharged home in no acute distress and stable vitals. Document critical care time when appropriate:1}      Final Clinical Impression(s) / ED Diagnoses Final diagnoses:  Acute right-sided low back pain with right-sided sciatica    Rx / DC Orders ED Discharge Orders          Ordered    methocarbamol (ROBAXIN) 500 MG tablet  2 times daily         06/09/23 0829              Mannie Stabile, PA-C 06/09/23 0920    Glendora Score, MD 06/10/23 1150

## 2023-06-09 NOTE — Discharge Instructions (Addendum)
It was a pleasure taking care of you today.  As discussed, I am you sending home with a muscle relaxer.  Take as needed for pain.  Muscle relaxer can cause drowsiness so do not drive or operate machinery while on the medication.  I have included the number of the neurosurgeon.  Please call to schedule an appointment for further evaluation.  Return to the ER for new or worsening symptoms.

## 2023-06-15 ENCOUNTER — Ambulatory Visit: Payer: Managed Care, Other (non HMO) | Admitting: "Endocrinology

## 2023-06-15 ENCOUNTER — Encounter (HOSPITAL_COMMUNITY): Payer: Commercial Managed Care - HMO | Admitting: Physician Assistant

## 2023-06-16 ENCOUNTER — Other Ambulatory Visit: Payer: Self-pay

## 2023-06-21 ENCOUNTER — Other Ambulatory Visit (HOSPITAL_COMMUNITY): Payer: Self-pay

## 2023-06-21 ENCOUNTER — Other Ambulatory Visit: Payer: Self-pay

## 2023-06-21 DIAGNOSIS — E1165 Type 2 diabetes mellitus with hyperglycemia: Secondary | ICD-10-CM

## 2023-06-21 MED ORDER — FREESTYLE LIBRE 3 PLUS SENSOR MISC
1.0000 | 3 refills | Status: DC
Start: 2023-06-21 — End: 2023-11-29
  Filled 2023-06-21: qty 6, 90d supply, fill #0
  Filled 2023-07-12 – 2023-11-01 (×6): qty 6, 90d supply, fill #1
  Filled 2023-11-07 – 2023-11-09 (×3): qty 2, 30d supply, fill #1
  Filled 2023-11-19: qty 2, 30d supply, fill #0
  Filled 2023-11-19: qty 6, 90d supply, fill #1

## 2023-06-22 ENCOUNTER — Other Ambulatory Visit (HOSPITAL_COMMUNITY): Payer: Self-pay

## 2023-06-22 ENCOUNTER — Ambulatory Visit (INDEPENDENT_AMBULATORY_CARE_PROVIDER_SITE_OTHER): Payer: Managed Care, Other (non HMO) | Admitting: "Endocrinology

## 2023-06-22 ENCOUNTER — Encounter: Payer: Self-pay | Admitting: "Endocrinology

## 2023-06-22 VITALS — BP 115/80 | HR 90 | Ht 68.0 in | Wt 242.8 lb

## 2023-06-22 DIAGNOSIS — E7800 Pure hypercholesterolemia, unspecified: Secondary | ICD-10-CM | POA: Diagnosis not present

## 2023-06-22 DIAGNOSIS — Z7985 Long-term (current) use of injectable non-insulin antidiabetic drugs: Secondary | ICD-10-CM

## 2023-06-22 DIAGNOSIS — Z794 Long term (current) use of insulin: Secondary | ICD-10-CM

## 2023-06-22 DIAGNOSIS — E1165 Type 2 diabetes mellitus with hyperglycemia: Secondary | ICD-10-CM

## 2023-06-22 MED ORDER — TRULICITY 1.5 MG/0.5ML ~~LOC~~ SOAJ
1.5000 mg | SUBCUTANEOUS | 1 refills | Status: DC
  Filled 2023-06-22: qty 2, 28d supply, fill #0
  Filled 2023-07-12 – 2023-07-19 (×2): qty 2, 28d supply, fill #1

## 2023-06-22 NOTE — Patient Instructions (Signed)
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
## 2023-06-23 ENCOUNTER — Ambulatory Visit (INDEPENDENT_AMBULATORY_CARE_PROVIDER_SITE_OTHER): Payer: 59 | Admitting: Physician Assistant

## 2023-06-23 ENCOUNTER — Encounter (HOSPITAL_COMMUNITY): Payer: Self-pay | Admitting: Physician Assistant

## 2023-06-23 ENCOUNTER — Encounter: Payer: Self-pay | Admitting: "Endocrinology

## 2023-06-23 DIAGNOSIS — F419 Anxiety disorder, unspecified: Secondary | ICD-10-CM

## 2023-06-23 DIAGNOSIS — F331 Major depressive disorder, recurrent, moderate: Secondary | ICD-10-CM | POA: Diagnosis not present

## 2023-06-23 MED ORDER — SERTRALINE HCL 100 MG PO TABS: 100.0000 mg | ORAL_TABLET | Freq: Every day | ORAL | 2 refills | Status: DC

## 2023-06-23 NOTE — Progress Notes (Signed)
BH MD/PA/NP OP Progress Note  06/23/2023 7:42 PM KEEGHAN [name]  MRN:  [id number]  Chief Complaint:  Chief Complaint  Patient presents with   Follow-up   Medication Management   HPI:   [name]. [name] is a 37 year old, African-American female with a past psychiatric history significant for major depressive disorder and anxiety who presents to [facility] for follow-up and medication management.  Patient is currently being managed on the following medication: Sertraline 50 mg daily.  Patient presents to the encounter stating that the medication is still effective.  She endorses some drowsiness but denies any other side effects from using her medication.  Patient reports no major issues or concerns at this time.  She does report that she had to pick up another job and is working third shift at [employer].  She also reports that she recently messed up her back due to a work related injury.  She reports that her back issues have given her trouble to the point where standing, sitting, and adjusting her back hurts.  In addition to working, patient reports that she is also in school for phlebotomy.  She reports that she is about to start clinicals for her phlebotomy courses.  Patient reports that she has been picking up more work due to wanting to purchase a more affordable home.  She reports that she is also trying to take care of herself both physically and mentally so that she can be more supportive of her children.  She states that she was experiencing depressive previously but states that it has been manageable through focusing on positive thoughts.  She reports that she previously had depression from not having full fledged support from her partner.  Patient endorses anxiety and rates her anxiety as 5 out of 10.  Patient's current stressors include her work load and back pain.  A GAD-7 screen was performed with the patient scoring an 18.  Patient is  alert and oriented x 4, calm, cooperative, and fully engaged in conversation during the encounter.  Patient reports that she feels at peace.  Patient denies suicidal or homicidal ideations.  She further denies auditory or visual hallucinations and does not appear to be responding to internal/external stimuli.  Patient endorses fair sleep and receives on average 5 hours of sleep per night.  Patient endorses fair appetite and eats on average 2 meals per day.  Patient denies alcohol consumption.  Patient endorses tobacco use and states that she smokes Black and Milds sparingly.  Patient endorses illicit drug use in the form of marijuana.  Visit Diagnosis:    ICD-10-CM   1. Moderate episode of recurrent major depressive disorder (HCC)  F33.1 sertraline (ZOLOFT) 100 MG tablet    2. Anxiety disorder, unspecified type  F41.9 sertraline (ZOLOFT) 100 MG tablet     Past Psychiatric History:  Patient endorses a past history depression and anxiety   Past Medical History:  Past Medical History:  Diagnosis Date   Asthma    COVID-19 07/04/2020   05/04/20   Diabetes mellitus without complication (HCC)    DKA (diabetic ketoacidoses) 08/27/2018   Family history of diabetes mellitus in mother 01/10/2018   Fibroid    pedunculated posterior RUQ near fundus. approx 3-4cm at time of 01/2016 c-section   GBS (group B streptococcus) infection    Hearing loss    pt states she was tested in high school and told it was selective she says she continues to have a deficit  and does not feel it is selective   Herpes    Herpes simplex vulvovaginitis 09/06/2020   Hypertension    Newly diagnosed diabetes (HCC) 01/10/2018   Diagnosed 01/10/18  A1c 7.5%   Sickle cell trait (HCC) 09/01/2014   [ ]  ucx q trimester   Trichomonas vaginitis     Past Surgical History:  Procedure Laterality Date   CESAREAN SECTION N/A 01/08/2016   Procedure: CESAREAN SECTION;  Surgeon: Sp[place] Bing, MD;  Location: [facility];  Service:  Obstetrics;  Laterality: N/A;   CESAREAN SECTION N/A 09/10/2020   Procedure: CESAREAN SECTION;  Surgeon: Levie Heritage, DO;  Location: [facility];  Service: Obstetrics;  Laterality: N/A;   DILATION AND CURETTAGE OF UTERUS     TUBAL LIGATION  09/10/2020   Procedure: BILATERAL TUBAL LIGATION;  Surgeon: Levie Heritage, DO;  Location: [facility] LD [facility];  Service: Obstetrics;;   WISDOM TOOTH EXTRACTION      Family Psychiatric History:  Mother - patient reports that her mother states that she has "everything in the book" in regards to her mental health Sister - history of suicide and depression Brother (deceased) - depression   Family history of suicide attempt: Patient reports that her mother and sister have attempted suicide Family history of homicide attempt: Patient denies a family history of homicide attempts Family history of substance abuse: Patient reports that her mother abused drugs in the past  Family History:  Family History  Problem Relation Age of Onset   Diabetes Mother    Hypertension Mother    Hyperlipidemia Mother    Heart disease Mother    Breast cancer Mother        47   Ovarian cancer Mother        Unknown age   Diabetes Sister    Diabetes Brother    Breast cancer Cousin    Heart disease Maternal Grandmother     Social History:  Social History   Socioeconomic History   Marital status: Married    Spouse name: Not on file   Number of children: Not on file   Years of education: Not on file   Highest education level: Not on file  Occupational History   Not on file  Tobacco Use   Smoking status: Some Days    Types: Cigarettes, E-cigarettes   Smokeless tobacco: Never   Tobacco comments:    quit 2013 cigarettes, I vape every once in a blue moon.  Vaping Use   Vaping status: Never Used  Substance and Sexual Activity   Alcohol use: No   Drug use: No   Sexual activity: Yes    Birth control/protection: None  Other Topics Concern   Not on file  Social History  Narrative   Not on file   Social Determinants of Health   Financial Resource Strain: Not on File (01/25/2023)   Received from [facility]    Financial Resource Strain: 0  Food Insecurity: Not on file (02/16/2023)  Transportation Needs: At Risk (02/16/2023)   Received from [facility] Needs    Transportation: 2  Physical Activity: Not on File (01/25/2023)   [facility]   Physical Activity    Physical Activity: 0  Stress: Not on File (01/25/2023)   Received from [facility]   Stress    Stress: 0  Social Connections: Not on File (04/08/2023)   Received from [facility]   Social Connections    Connectedness: 0  Allergies: No Known Allergies  Metabolic Disorder Labs: Lab Results  Component Value Date   HGBA1C 15.0 (A) 05/11/2023   MPG  09/25/2022     Comment:     eAG cannot be calculated. Hemoglobin A1c result exceeds the linearity of the assay. . HbA1c performed on Roche platform. Effective 05/11/22 a change in test platforms may have  shifted HbA1c results compared to historical results.    MPG 162.81 09/10/2020   No results found for: "PROLACTIN" Lab Results  Component Value Date   CHOL 185 09/25/2022   TRIG 82 09/25/2022   HDL 53 09/25/2022   CHOLHDL 3.5 09/25/2022   LDLCALC 114 (H) 09/25/2022   LDLCALC 138 (H) 12/05/2020   Lab Results  Component Value Date   TSH 1.13 09/25/2022   TSH 1.350 11/28/2020    Therapeutic Level Labs: No results found for: "LITHIUM" No results found for: "VALPROATE" No results found for: "CBMZ"  Current Medications: Current Outpatient Medications  Medication Sig Dispense Refill   albuterol (VENTOLIN HFA) 108 (90 Base) MCG/ACT inhaler INHALE 2 PUFFS INTO THE LUNGS EVERY 6 HOURS AS NEEDED FOR WHEEZING OR SHORTNESS OF BREATH 18 g 5   atorvastatin (LIPITOR) 20 MG tablet Take 1 tablet (20 mg total) by mouth daily. 90 tablet 1   Continuous Blood Gluc Receiver (FREESTYLE LIBRE 3 READER) DEVI 1 Device by  Does not apply route in the morning, at noon, in the evening, and at bedtime. (Patient taking differently: 1 Device by Does not apply route See admin instructions. Use as directed, changing every 14 days.) 1 each 5   Continuous Glucose Sensor (FREESTYLE LIBRE 3 PLUS SENSOR) MISC Inject 1 Device into the skin for continuous blood glucose monitoring, change every 15 days. 6 each 3   cyclobenzaprine (FLEXERIL) 10 MG tablet Take 1 tablet (10 mg total) by mouth 2 (two) times daily as needed for muscle spasms. 20 tablet 0   Dulaglutide (TRULICITY) 1.5 MG/0.5ML SOAJ Inject 1.5 mg into the skin once a week. 2 mL 1   Glucagon (GVOKE HYPOPEN 1-PACK) 1 MG/0.2ML SOAJ Inject 1 mg into the skin as needed (low blood sugar with impaired consciousness). 0.4 mL 2   Insulin Glargine (BASAGLAR KWIKPEN) 100 UNIT/ML Inject 45 Units into the skin daily. 15 mL 3   insulin glargine (LANTUS) 100 UNIT/ML injection Inject 45 Units into the skin at bedtime.     insulin isophane & regular human KwikPen (HUMULIN 70/30 KWIKPEN) (70-30) 100 UNIT/ML KwikPen Inject 22 Units into the skin See admin instructions. Before meals. (Patient not taking: Reported on 06/22/2023)     insulin lispro (HUMALOG) 100 UNIT/ML KwikPen Inject 15 Units into the skin 3 (three) times daily with meals. (Patient not taking: Reported on 06/22/2023) 45 mL 1   Insulin Pen Needle (AUM MINI INSULIN PEN NEEDLE) 32G X 6 MM MISC Use with Lantus nightly and NPH twice daily 100 each 11   lidocaine (LIDODERM) 5 % Place 1 patch onto the skin daily. Remove & Discard patch within 12 hours or as directed by MD 15 patch 0   methocarbamol (ROBAXIN) 500 MG tablet Take 1 tablet (500 mg total) by mouth 2 (two) times daily. 20 tablet 0   oxyCODONE-acetaminophen (PERCOCET/ROXICET) 5-325 MG tablet Take 1 tablet by mouth every 4 (four) hours as needed for severe pain (pain score 7-10). 15 tablet 0   pregabalin (LYRICA) 50 MG capsule Take 1 capsule (50 mg total) by mouth 3 (three)  times daily. 90 capsule 1  sertraline (ZOLOFT) 100 MG tablet Take 1 tablet (100 mg total) by mouth daily. 30 tablet 2   valACYclovir (VALTREX) 1000 MG tablet Take 1 tablet (1,000 mg total) by mouth daily for 5 days 5 tablet 2   No current facility-administered medications for this visit.     Musculoskeletal: Strength & Muscle Tone: within normal limits Gait & Station: normal Patient leans: N/A  Psychiatric Specialty Exam: Review of Systems  Psychiatric/Behavioral:  Positive for sleep disturbance. Negative for decreased concentration, dysphoric mood, hallucinations, self-injury and suicidal ideas. The patient is nervous/anxious. The patient is not hyperactive.     Blood pressure 121/75, pulse 100, temperature 98 F (36.7 C), temperature source Oral, height 5\' 8"  (1.727 m), weight 244 lb 9.6 oz (110.9 kg), last menstrual period 05/12/2023, SpO2 98%.Body mass index is 37.19 kg/m.  General Appearance: Casual  Eye Contact:  Good  Speech:  Clear and Coherent and Normal Rate  Volume:  Normal  Mood:  Anxious and Depressed  Affect:  Appropriate  Thought Process:  Coherent and Descriptions of Associations: Intact  Orientation:  Full (Time, Place, and Person)  Thought Content: WDL   Suicidal Thoughts:  No  Homicidal Thoughts:  No  Memory:  Immediate;   Good Recent;   Good Remote;   Good  Judgement:  Good  Insight:  Good  Psychomotor Activity:  Normal  Concentration:  Concentration: Good and Attention Span: Good  Recall:  Good  Fund of Knowledge: Good  Language: Good  Akathisia:  No  Handed:  Right  AIMS (if indicated): not done  Assets:  Communication Skills Desire for Improvement Social Support  ADL's:  Intact  Cognition: WNL  Sleep:  Fair   Screenings: GAD-7    Flowsheet Row Clinical Support from 06/23/2023 in [facility] Clinical Support from 04/14/2023 in [facility] Clinical Support from 02/10/2023 in [facility] Clinical Support from 12/08/2022 in [facility] Office Visit from 10/21/2022 in [facility]  Total GAD-7 Score 18 19 17 12 19       PHQ2-9    Flowsheet Row Clinical Support from 06/23/2023 in [facility] Clinical Support from 04/14/2023 in [facility] Clinical Support from 02/10/2023 in [facility] Clinical Support from 12/08/2022 in [facility] Office Visit from 10/21/2022 in [facility]  PHQ-2 Total Score 1 3 2  0 5  PHQ-9 Total Score -- 17 10 -- 18      Flowsheet Row Clinical Support from 06/23/2023 in [facility] ED from 06/09/2023 in [facility] at [facility] ED from 06/02/2023 in [facility] Department at [facility]  C-SSRS RISK CATEGORY No Risk No Risk No Risk        Assessment and Plan:   Hollie Siddons. [name] is a 37 year old, African-American female with a past psychiatric history significant for major depressive disorder and anxiety who presents to [facility] for follow-up and medication management.  Patient presents to the encounter endorsing a number of stressors related to her current work load, back pain issues, and financial stressors.  Patient reports that she recently injured her back while on the job and is currently on leave.  She also reports that she took up a third shift position at [employer]  to help with her financial stressors.  She denies overt depressive symptoms at this time but states that they were experiencing depression a few weeks back due to not having full fledged support from his her partner.  Patient also endorses some anxiety.  Provider recommended increasing her dosage of  sertraline from 50 mg to 100 mg daily for the management of her depressive symptoms and anxiety.  Patient was agreeable to recommendation.  Patient's medication to be e-prescribed to pharmacy of choice.  Collaboration of Care: Collaboration of Care: Medication Management AEB provider managing patient's psychiatric medications, Psychiatrist AEB patient being seen by mental health provider at this facility, and Referral or follow-up with counselor/therapist AEB patient being seen by a licensed clinical social worker at this facility  Patient/Guardian was advised Release of Information must be obtained prior to any record release in order to collaborate their care with an outside provider. Patient/Guardian was advised if they have not already done so to contact the registration department to sign all necessary forms in order for [place] to release information regarding their care.   Consent: Patient/Guardian gives verbal consent for treatment and assignment of benefits for services provided during this visit. Patient/Guardian expressed understanding and agreed to proceed.   1. Moderate episode of recurrent major depressive disorder (HCC)  - sertraline (ZOLOFT) 100 MG tablet; Take 1 tablet (100 mg total) by mouth daily.  Dispense: 30 tablet; Refill: 2  2. Anxiety disorder, unspecified type  - sertraline (ZOLOFT) 100 MG tablet; Take 1 tablet (100 mg total) by mouth daily.  Dispense: 30 tablet; Refill: 2  Patient to follow-up in 6 weeks Provider spent a total of 27 minutes with the patient/reviewing patient's chart  Meta Hatchet, PA 06/23/2023, 7:42 PM

## 2023-06-30 ENCOUNTER — Encounter: Payer: Self-pay | Admitting: "Endocrinology

## 2023-06-30 ENCOUNTER — Telehealth (INDEPENDENT_AMBULATORY_CARE_PROVIDER_SITE_OTHER): Payer: Commercial Managed Care - HMO | Admitting: "Endocrinology

## 2023-06-30 DIAGNOSIS — E1165 Type 2 diabetes mellitus with hyperglycemia: Secondary | ICD-10-CM

## 2023-06-30 DIAGNOSIS — Z794 Long term (current) use of insulin: Secondary | ICD-10-CM

## 2023-06-30 DIAGNOSIS — E7800 Pure hypercholesterolemia, unspecified: Secondary | ICD-10-CM

## 2023-06-30 DIAGNOSIS — Z7985 Long-term (current) use of injectable non-insulin antidiabetic drugs: Secondary | ICD-10-CM | POA: Diagnosis not present
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
## 2023-07-05 ENCOUNTER — Ambulatory Visit: Payer: Commercial Managed Care - HMO | Admitting: Obstetrics and Gynecology

## 2023-07-12 ENCOUNTER — Other Ambulatory Visit (HOSPITAL_COMMUNITY): Payer: Self-pay

## 2023-07-12 ENCOUNTER — Other Ambulatory Visit (HOSPITAL_COMMUNITY): Payer: Self-pay | Admitting: Student

## 2023-07-12 ENCOUNTER — Other Ambulatory Visit: Payer: Self-pay | Admitting: Obstetrics and Gynecology

## 2023-07-12 DIAGNOSIS — B009 Herpesviral infection, unspecified: Secondary | ICD-10-CM

## 2023-07-12 MED ORDER — LIDOCAINE 5 % EX PTCH
1.0000 | MEDICATED_PATCH | Freq: Every day | CUTANEOUS | 2 refills | Status: DC
  Filled 2023-07-12 – 2023-07-19 (×2): qty 30, 30d supply, fill #0
  Filled 2023-08-18 – 2023-12-29 (×3): qty 30, 30d supply, fill #1
  Filled 2024-03-08: qty 30, 30d supply, fill #2

## 2023-07-13 ENCOUNTER — Other Ambulatory Visit: Payer: Self-pay

## 2023-07-19 ENCOUNTER — Other Ambulatory Visit (HOSPITAL_COMMUNITY): Payer: Self-pay

## 2023-07-19 ENCOUNTER — Other Ambulatory Visit: Payer: Self-pay

## 2023-07-29 ENCOUNTER — Other Ambulatory Visit: Payer: Self-pay

## 2023-07-30 ENCOUNTER — Other Ambulatory Visit (HOSPITAL_COMMUNITY): Payer: Self-pay

## 2023-07-30 ENCOUNTER — Other Ambulatory Visit: Payer: Self-pay

## 2023-07-30 MED ORDER — TRULICITY 1.5 MG/0.5ML ~~LOC~~ SOAJ
1.5000 mg | SUBCUTANEOUS | 1 refills | Status: DC
  Filled 2023-07-30 – 2023-08-05 (×3): qty 6, 84d supply, fill #0
  Filled 2023-11-01: qty 2, 28d supply, fill #1

## 2023-08-02 ENCOUNTER — Other Ambulatory Visit (HOSPITAL_COMMUNITY): Payer: Self-pay

## 2023-08-02 ENCOUNTER — Other Ambulatory Visit: Payer: Self-pay

## 2023-08-05 ENCOUNTER — Other Ambulatory Visit: Payer: Self-pay

## 2023-08-05 ENCOUNTER — Other Ambulatory Visit (HOSPITAL_COMMUNITY): Payer: Self-pay

## 2023-08-06 ENCOUNTER — Telehealth (HOSPITAL_COMMUNITY): Payer: Commercial Managed Care - HMO | Admitting: Physician Assistant

## 2023-08-06 ENCOUNTER — Encounter (HOSPITAL_COMMUNITY): Payer: Self-pay | Admitting: Physician Assistant

## 2023-08-06 DIAGNOSIS — F331 Major depressive disorder, recurrent, moderate: Secondary | ICD-10-CM | POA: Diagnosis not present

## 2023-08-06 DIAGNOSIS — F419 Anxiety disorder, unspecified: Secondary | ICD-10-CM

## 2023-08-06 MED ORDER — SERTRALINE HCL 100 MG PO TABS: 100.0000 mg | ORAL_TABLET | Freq: Every day | ORAL | 2 refills | Status: DC

## 2023-08-06 NOTE — Progress Notes (Signed)
 [facility] MD/PA/NP OP Progress Note  Virtual Visit via Video Note  I connected with [name] on 08/06/23 at  2:30 PM EST by a video enabled telemedicine application and verified that I am speaking with the correct person using two identifiers.  Location: Patient: Home Provider: Clinic   I discussed the limitations of evaluation and management by telemedicine and the availability of in person appointments. The patient expressed understanding and agreed to proceed.  Follow Up Instructions:  I discussed the assessment and treatment plan with the patient. The patient was provided an opportunity to ask questions and all were answered. The patient agreed with the plan and demonstrated an understanding of the instructions.   The patient was advised to call back or seek an in-person evaluation if the symptoms worsen or if the condition fails to improve as anticipated.  I provided 19 minutes of non-face-to-face time during this encounter.  Raven FORBES Bolster, PA    08/06/2023 9:55 PM [name]  MRN:  [id number]  Chief Complaint:  Chief Complaint  Patient presents with   Follow-up   Medication Refill   HPI:   [name] is a 38 year old, African-American female with a past psychiatric history significant for major depressive disorder and anxiety who presents to [facility] for follow-up and medication management.  Patient is currently being managed on the following medication: Sertraline  100 mg daily.  Patient presents today encounter stating that the increase in her Zoloft  has been helpful in managing her depressive symptoms.  Patient reports no adverse side effects other than sleepiness.  Patient denies experiencing overt depressive symptoms and states that she is able to take situations that would normally stress her out and move on from them.  Patient endorses some anxiety but states that she is trying not to stress over  things that she is unable to control.  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  She also reports that her stepson recently abandoned the family due to not wanting to contribute financially.  A GAD-7 screen was performed with the patient scoring a 7.  Patient is alert and oriented x 4, calm, cooperative, and fully engaged in conversation during the encounter.  Patient reports that she is at peace and is relaxed.  Patient denies suicidal or homicidal ideations.  Patient denies auditory or visual hallucinations and does not appear to be responding to internal/external stimuli.  Patient endorses good sleep and receives on average 8 hours of sleep per night.  Patient endorses good appetite and eats on average 3 meals per day.  Patient endorses alcohol consumption sparingly.  Patient denies tobacco use or illicit drug use.  Visit Diagnosis:    ICD-10-CM   1. Moderate episode of recurrent major depressive disorder (HCC)  F33.1 sertraline  (ZOLOFT ) 100 MG tablet    2. Anxiety disorder, unspecified type  F41.9 sertraline  (ZOLOFT ) 100 MG tablet     Past Psychiatric History:  Patient endorses a past history depression and anxiety   Past Medical History:  Past Medical History:  Diagnosis Date   Asthma    COVID-19 07/04/2020   05/04/20   Diabetes mellitus without complication (HCC)    DKA (diabetic ketoacidoses) 08/27/2018   Family history of diabetes mellitus in mother 01/10/2018   Fibroid    pedunculated posterior RUQ near fundus. approx 3-4cm at time of 01/2016 c-section   GBS (group B streptococcus) infection    Hearing  loss    pt states she was tested in high school and told it was selective she says she continues to have a deficit and does not feel it is selective   Herpes    Herpes simplex vulvovaginitis 09/06/2020   Hypertension    Newly diagnosed diabetes (HCC) 01/10/2018   Diagnosed 01/10/18  A1c 7.5%   Sickle cell trait (HCC)  09/01/2014   [ ]  ucx q trimester   Trichomonas vaginitis     Past Surgical History:  Procedure Laterality Date   CESAREAN SECTION N/A 01/08/2016   Procedure: CESAREAN SECTION;  Surgeon: Bebe Furry, MD;  Location: [facility] BIRTHING SUITES;  Service: Obstetrics;  Laterality: N/A;   CESAREAN SECTION N/A 09/10/2020   Procedure: CESAREAN SECTION;  Surgeon: Barbra Lang PARAS, DO;  Location: [facility] LD ORS;  Service: Obstetrics;  Laterality: N/A;   DILATION AND CURETTAGE OF UTERUS     TUBAL LIGATION  09/10/2020   Procedure: BILATERAL TUBAL LIGATION;  Surgeon: Barbra Lang PARAS, DO;  Location: [facility] LD ORS;  Service: Obstetrics;;   WISDOM TOOTH EXTRACTION      Family Psychiatric History:  Mother - patient reports that her mother states that she has everything in the book in regards to her mental health Sister - history of suicide and depression Brother (deceased) - depression   Family history of suicide attempt: Patient reports that her mother and sister have attempted suicide Family history of homicide attempt: Patient denies a family history of homicide attempts Family history of substance abuse: Patient reports that her mother abused drugs in the past  Family History:  Family History  Problem Relation Age of Onset   Diabetes Mother    Hypertension Mother    Hyperlipidemia Mother    Heart disease Mother    Breast cancer Mother        34   Ovarian cancer Mother        Unknown age   Diabetes Sister    Diabetes Brother    Breast cancer Cousin    Heart disease Maternal Grandmother     Social History:  Social History   Socioeconomic History   Marital status: Married    Spouse name: Not on file   Number of children: Not on file   Years of education: Not on file   Highest education level: Not on file  Occupational History   Not on file  Tobacco Use   Smoking status: Some Days    Types: Cigarettes, E-cigarettes   Smokeless tobacco: Never   Tobacco comments:    quit 2013 cigarettes, I vape  every once in a blue moon.  Vaping Use   Vaping status: Never Used  Substance and Sexual Activity   Alcohol use: No   Drug use: No   Sexual activity: Yes    Birth control/protection: None  Other Topics Concern   Not on file  Social History Narrative   Not on file   Social Drivers of Health   Financial Resource Strain: Not on File (01/25/2023)   Received from [employer]    Financial Resource Strain: 0  Food Insecurity: Not on file (02/16/2023)  Transportation Needs: At Risk (02/16/2023)   Received from [employer] Needs    Transportation: 2  Physical Activity: Not on File (01/25/2023)   Received from [facility]   Physical Activity    Physical Activity: 0  Stress: Not on File (01/25/2023)   Received from [facility]   Stress  Stress: 0  Social Connections: Not on File (04/08/2023)   Received from [facility]   Social Connections    Connectedness: 0    Allergies: No Known Allergies  Metabolic Disorder Labs: Lab Results  Component Value Date   HGBA1C 15.0 (A) 05/11/2023   MPG  09/25/2022     Comment:     eAG cannot be calculated. Hemoglobin A1c result exceeds the linearity of the assay. . HbA1c performed on Roche platform. Effective 05/11/22 a change in test platforms may have  shifted HbA1c results compared to historical results.    MPG 162.81 09/10/2020   No results found for: PROLACTIN Lab Results  Component Value Date   CHOL 185 09/25/2022   TRIG 82 09/25/2022   HDL 53 09/25/2022   CHOLHDL 3.5 09/25/2022   LDLCALC 114 (H) 09/25/2022   LDLCALC 138 (H) 12/05/2020   Lab Results  Component Value Date   TSH 1.13 09/25/2022   TSH 1.350 11/28/2020    Therapeutic Level Labs: No results found for: LITHIUM No results found for: VALPROATE No results found for: CBMZ  Current Medications: Current Outpatient Medications  Medication Sig Dispense Refill   albuterol  (VENTOLIN  HFA) 108 (90 Base) MCG/ACT inhaler INHALE 2 PUFFS INTO THE  LUNGS EVERY 6 HOURS AS NEEDED FOR WHEEZING OR SHORTNESS OF BREATH 18 g 5   atorvastatin  (LIPITOR) 20 MG tablet Take 1 tablet (20 mg total) by mouth daily. 90 tablet 1   Continuous Blood Gluc Receiver (FREESTYLE LIBRE 3 READER) DEVI 1 Device by Does not apply route in the morning, at noon, in the evening, and at bedtime. (Patient taking differently: 1 Device by Does not apply route See admin instructions. Use as directed, changing every 14 days.) 1 each 5   Continuous Glucose Sensor (FREESTYLE LIBRE 3 PLUS SENSOR) MISC Inject 1 Device into the skin for continuous blood glucose monitoring, change every 15 days. 6 each 3   cyclobenzaprine  (FLEXERIL ) 10 MG tablet Take 1 tablet (10 mg total) by mouth 2 (two) times daily as needed for muscle spasms. 20 tablet 0   Dulaglutide  (TRULICITY ) 1.5 MG/0.5ML SOAJ Inject 1.5 mg into the skin once a week. 6 mL 1   Glucagon  (GVOKE HYPOPEN  1-PACK) 1 MG/0.2ML SOAJ Inject 1 mg into the skin as needed (low blood sugar with impaired consciousness). 0.4 mL 2   Insulin  Glargine (BASAGLAR  KWIKPEN) 100 UNIT/ML Inject 45 Units into the skin daily. 15 mL 3   insulin  glargine (LANTUS ) 100 UNIT/ML injection Inject 45 Units into the skin at bedtime.     insulin  isophane & regular human KwikPen (HUMULIN  70/30 KWIKPEN) (70-30) 100 UNIT/ML KwikPen Inject 22 Units into the skin See admin instructions. Before meals.     insulin  lispro (HUMALOG ) 100 UNIT/ML KwikPen Inject 15 Units into the skin 3 (three) times daily with meals. 45 mL 1   Insulin  Pen Needle (AUM MINI INSULIN  PEN NEEDLE) 32G X 6 MM MISC Use with Lantus  nightly and NPH twice daily 100 each 11   lidocaine  (LIDODERM ) 5 % Place 1 patch onto the skin daily. Remove & Discard patch within 12 hours or as directed by MD 15 patch 0   lidocaine  (LIDODERM ) 5 % Place 1 patch onto the skin daily. Remove & discard patch within 12 hours or as directed by MD. 30 patch 2   methocarbamol  (ROBAXIN ) 500 MG tablet Take 1 tablet (500 mg total) by  mouth 2 (two) times daily. 20 tablet 0   oxyCODONE -acetaminophen  (PERCOCET/ROXICET) 5-325 MG tablet Take  1 tablet by mouth every 4 (four) hours as needed for severe pain (pain score 7-10). 15 tablet 0   pregabalin  (LYRICA ) 50 MG capsule Take 1 capsule (50 mg total) by mouth 3 (three) times daily. 90 capsule 1   sertraline  (ZOLOFT ) 100 MG tablet Take 1 tablet (100 mg total) by mouth daily. 30 tablet 2   valACYclovir  (VALTREX ) 1000 MG tablet Take 1 tablet (1,000 mg total) by mouth daily for 5 days 5 tablet 2   No current facility-administered medications for this visit.     Musculoskeletal: Strength & Muscle Tone: within normal limits Gait & Station: normal Patient leans: N/A  Psychiatric Specialty Exam: Review of Systems  Psychiatric/Behavioral:  Negative for decreased concentration, dysphoric mood, hallucinations, self-injury, sleep disturbance and suicidal ideas. The patient is not nervous/anxious and is not hyperactive.     There were no vitals taken for this visit.There is no height or weight on file to calculate BMI.  General Appearance: Casual  Eye Contact:  Good  Speech:  Clear and Coherent and Normal Rate  Volume:  Normal  Mood:  Euthymic  Affect:  Appropriate  Thought Process:  Coherent and Descriptions of Associations: Intact  Orientation:  Full (Time, Place, and Person)  Thought Content: WDL   Suicidal Thoughts:  No  Homicidal Thoughts:  No  Memory:  Immediate;   Good Recent;   Good Remote;   Good  Judgement:  Good  Insight:  Good  Psychomotor Activity:  Normal  Concentration:  Concentration: Good and Attention Span: Good  Recall:  Good  Fund of Knowledge: Good  Language: Good  Akathisia:  No  Handed:  Right  AIMS (if indicated): not done  Assets:  Communication Skills Desire for Improvement Social Support  ADL's:  Intact  Cognition: WNL  Sleep:  Good   Screenings: GAD-7    Flowsheet Row Video Visit from 08/06/2023 in [facility] Clinical Support from 06/23/2023 in [facility] Clinical Support from 04/14/2023 in [facility] Clinical Support from 02/10/2023 in [facility] Clinical Support from 12/08/2022 in [facility]  Total GAD-7 Score 7 18 19 17 12       PHQ2-9    Flowsheet Row Video Visit from 08/06/2023 in [facility] Clinical Support from 06/23/2023 in [facility] Clinical Support from 04/14/2023 in [facility] Clinical Support from 02/10/2023 in [facility] Clinical Support from 12/08/2022 in [facility]  PHQ-2 Total Score 1 1 3 2  0  PHQ-9 Total Score -- -- 17 10 --      Flowsheet Row Video Visit from 08/06/2023 in [facility] Clinical Support from 06/23/2023 in [facility] ED from 06/09/2023 in [facility] Emergency Department at [facility]  C-SSRS RISK CATEGORY No Risk No Risk No Risk        Assessment and Plan:   [name] is a 38 year old, African-American female with a past psychiatric history significant for major depressive disorder and anxiety who presents to [facility] for follow-up and medication management.  Patient presents today encounter stating that her Zoloft  dosage adjustment was effective in managing her depressive symptoms.  Patient denies overt depressive symptoms at this time and appears to exhibit euthymic mood.  Patient continues to endorse some anxiety but states that it is manageable  at this time.  Patient denies the need for dosage adjustments at this time and would like to continue taking her medication as prescribed.  Patient's medication to be e-prescribed to pharmacy of choice.  Collaboration of Care: Collaboration  of Care: Medication Management AEB provider managing patient's psychiatric medications, Psychiatrist AEB patient being seen by mental health provider at this facility, and Referral or follow-up with counselor/therapist AEB patient being seen by a licensed clinical social worker at this facility  Patient/Guardian was advised Release of Information must be obtained prior to any record release in order to collaborate their care with an outside provider. Patient/Guardian was advised if they have not already done so to contact the registration department to sign all necessary forms in order for us  to release information regarding their care.   Consent: Patient/Guardian gives verbal consent for treatment and assignment of benefits for services provided during this visit. Patient/Guardian expressed understanding and agreed to proceed.   1. Moderate episode of recurrent major depressive disorder (HCC)  - sertraline  (ZOLOFT ) 100 MG tablet; Take 1 tablet (100 mg total) by mouth daily.  Dispense: 30 tablet; Refill: 2  2. Anxiety disorder, unspecified type  - sertraline  (ZOLOFT ) 100 MG tablet; Take 1 tablet (100 mg total) by mouth daily.  Dispense: 30 tablet; Refill: 2  Patient to follow-up in 6 weeks Provider spent a total of 19 minutes with the patient/reviewing patient's chart  Raven FORBES Bolster, PA 08/06/2023, 9:55 PM

## 2023-08-19 ENCOUNTER — Other Ambulatory Visit (HOSPITAL_COMMUNITY): Payer: Self-pay

## 2023-08-19 ENCOUNTER — Other Ambulatory Visit: Payer: Self-pay

## 2023-08-20 ENCOUNTER — Other Ambulatory Visit (HOSPITAL_COMMUNITY): Payer: Self-pay

## 2023-08-20 ENCOUNTER — Other Ambulatory Visit: Payer: Self-pay

## 2023-08-22 ENCOUNTER — Encounter: Payer: Self-pay | Admitting: "Endocrinology

## 2023-08-25 ENCOUNTER — Other Ambulatory Visit (HOSPITAL_COMMUNITY): Payer: Self-pay

## 2023-08-25 ENCOUNTER — Other Ambulatory Visit: Payer: Self-pay

## 2023-08-25 MED ORDER — ONETOUCH VERIO REFLECT W/DEVICE KIT
PACK | Freq: Three times a day (TID) | 0 refills | Status: DC
  Filled 2023-08-25: qty 1, fill #0

## 2023-08-25 MED ORDER — ONETOUCH DELICA LANCETS 33G MISC
Freq: Three times a day (TID) | 5 refills | Status: DC
  Filled 2023-08-25: qty 300, fill #0

## 2023-08-25 MED ORDER — ONETOUCH VERIO VI STRP
ORAL_STRIP | Freq: Three times a day (TID) | 5 refills | Status: DC
  Filled 2023-08-25: qty 200, 67d supply, fill #0

## 2023-08-25 MED ORDER — ONETOUCH DELICA LANCETS 33G MISC
Freq: Three times a day (TID) | 5 refills | Status: DC
  Filled 2023-08-25: qty 200, 67d supply, fill #0

## 2023-08-25 MED ORDER — ONETOUCH VERIO REFLECT W/DEVICE KIT
PACK | Freq: Three times a day (TID) | 0 refills | Status: DC
  Filled 2023-08-25: qty 1, 30d supply, fill #0

## 2023-08-25 MED ORDER — ONETOUCH VERIO VI STRP
ORAL_STRIP | Freq: Three times a day (TID) | 5 refills | Status: DC
  Filled 2023-08-25: qty 250, 83d supply, fill #0

## 2023-08-25 NOTE — Addendum Note (Signed)
Addended by: Pollie Meyer on: 08/25/2023 04:25 PM   Modules accepted: Orders

## 2023-08-26 ENCOUNTER — Other Ambulatory Visit: Payer: Self-pay

## 2023-08-26 ENCOUNTER — Other Ambulatory Visit (HOSPITAL_COMMUNITY): Payer: Self-pay

## 2023-08-26 MED ORDER — ACCU-CHEK SOFTCLIX LANCETS MISC
5 refills | Status: AC
  Filled 2023-08-26: qty 200, 66d supply, fill #0
  Filled 2023-11-01: qty 200, 66d supply, fill #1
  Filled 2024-03-08 – 2024-03-30 (×2): qty 200, 66d supply, fill #2

## 2023-08-26 MED ORDER — ACCU-CHEK GUIDE TEST VI STRP
ORAL_STRIP | 5 refills | Status: DC
  Filled 2023-08-26: qty 200, 66d supply, fill #0
  Filled 2023-11-01: qty 300, 90d supply, fill #1
  Filled 2024-03-08 – 2024-03-30 (×2): qty 300, 90d supply, fill #2

## 2023-08-26 MED ORDER — ACCU-CHEK GUIDE W/DEVICE KIT
PACK | 0 refills | Status: DC
  Filled 2023-08-26: qty 1, 30d supply, fill #0

## 2023-08-26 NOTE — Addendum Note (Signed)
Addended by: Pollie Meyer on: 08/26/2023 08:01 AM   Modules accepted: Orders

## 2023-09-14 ENCOUNTER — Other Ambulatory Visit (HOSPITAL_COMMUNITY): Payer: Self-pay

## 2023-09-17 ENCOUNTER — Other Ambulatory Visit (HOSPITAL_COMMUNITY): Payer: Self-pay

## 2023-09-18 ENCOUNTER — Other Ambulatory Visit (HOSPITAL_COMMUNITY): Payer: Self-pay

## 2023-09-20 ENCOUNTER — Other Ambulatory Visit (HOSPITAL_COMMUNITY): Payer: Self-pay

## 2023-09-21 ENCOUNTER — Telehealth: Payer: Self-pay

## 2023-09-21 ENCOUNTER — Other Ambulatory Visit (HOSPITAL_COMMUNITY): Payer: Self-pay

## 2023-09-21 NOTE — Telephone Encounter (Signed)
Pharmacy Patient Advocate Encounter   Received notification from Pt Calls Messages that prior authorization for Freestyle libre 3 plus is required/requested.   Insurance verification completed.   The patient is insured through [employer] .   Per test claim: Patient not eligible due to non payment of premium

## 2023-09-21 NOTE — Telephone Encounter (Signed)
 Libre needs PA

## 2023-09-23 ENCOUNTER — Other Ambulatory Visit (HOSPITAL_COMMUNITY): Payer: Self-pay

## 2023-09-30 ENCOUNTER — Ambulatory Visit (INDEPENDENT_AMBULATORY_CARE_PROVIDER_SITE_OTHER): Payer: Commercial Managed Care - HMO | Admitting: Podiatry

## 2023-09-30 ENCOUNTER — Encounter: Payer: Self-pay | Admitting: Podiatry

## 2023-09-30 DIAGNOSIS — B351 Tinea unguium: Secondary | ICD-10-CM

## 2023-09-30 DIAGNOSIS — M79674 Pain in right toe(s): Secondary | ICD-10-CM

## 2023-09-30 DIAGNOSIS — E1142 Type 2 diabetes mellitus with diabetic polyneuropathy: Secondary | ICD-10-CM

## 2023-09-30 DIAGNOSIS — Z794 Long term (current) use of insulin: Secondary | ICD-10-CM

## 2023-09-30 DIAGNOSIS — M79675 Pain in left toe(s): Secondary | ICD-10-CM

## 2023-09-30 NOTE — Progress Notes (Signed)
  Subjective:  Patient ID: [name], female    DOB: 1986/07/02,  MRN: [id number]  Chief Complaint  Patient presents with   Diabetes    [facility]    38 y.o. female presents with the above complaint. History confirmed with patient. Patient presenting for routine diabetic foot exam.  Presenting for nail trim for painful, thickened, toenails that are painful with certain shoe gear.  He has been taking gabapentin for neuropathy, she has difficulty tolerating this due to side effects.  She does have a history of type 2 diabetes with and currently on insulin.  Historically has been working found, her A1c back in October 2024 was 15.  She does state that her blood sugars have been under better control lately.  Objective:  Physical Exam: warm, good capillary refill, dry pedal skin Onychomycosis of toenails with increased thickening DP pulses palpable, PT pulses palpable, and protective sensation intact, vibratory sensation diminished Left Foot: No open wounds or hyperkeratotic lesions present. nails with thickening and dystrophy at this time painful Right Foot: No open wounds or hyperkeratotic lesions present. nails with thickening and dystrophy at this time painful  Assessment:   1. Pain due to onychomycosis of toenails of both feet   2. Type 2 diabetes mellitus with diabetic polyneuropathy, with long-term current use of insulin (HCC)        Plan:  Patient was evaluated and treated and all questions answered.  # DM2 with neuropathic pain -Advised patient to discuss neuropathic medication with diabetes doctor.  We could trial another medication if she would like to as well. -Tensional interested in Qutenza if we trial another medication.  Did look at this previously with Dr. Annamary Rummage and would have had a high out-of-pocket cost.  #Onychomycosis with pain  -Nails palliatively debrided as below. -Educated on self-care  Procedure: Nail Debridement Rationale: Pain Type of  Debridement: manual, sharp debridement. Instrumentation: Nail nipper, rotary burr. Number of Nails: 10  Return in about 3 months (around 12/28/2023) for Diabetic Foot Care.         Barbaraann Share, DPM [facility]

## 2023-10-08 ENCOUNTER — Telehealth (HOSPITAL_COMMUNITY): Payer: Commercial Managed Care - HMO | Admitting: Physician Assistant

## 2023-10-12 ENCOUNTER — Other Ambulatory Visit (HOSPITAL_COMMUNITY): Payer: Self-pay

## 2023-10-21 ENCOUNTER — Ambulatory Visit: Payer: Commercial Managed Care - HMO | Admitting: Obstetrics & Gynecology

## 2023-11-01 ENCOUNTER — Other Ambulatory Visit (HOSPITAL_COMMUNITY): Payer: Self-pay

## 2023-11-01 ENCOUNTER — Other Ambulatory Visit: Payer: Self-pay | Admitting: "Endocrinology

## 2023-11-04 ENCOUNTER — Other Ambulatory Visit (HOSPITAL_COMMUNITY): Payer: Self-pay

## 2023-11-04 ENCOUNTER — Ambulatory Visit (INDEPENDENT_AMBULATORY_CARE_PROVIDER_SITE_OTHER): Admitting: Physician Assistant

## 2023-11-04 ENCOUNTER — Encounter (HOSPITAL_COMMUNITY): Payer: Self-pay | Admitting: Physician Assistant

## 2023-11-04 ENCOUNTER — Other Ambulatory Visit: Payer: Self-pay

## 2023-11-04 DIAGNOSIS — F331 Major depressive disorder, recurrent, moderate: Secondary | ICD-10-CM

## 2023-11-04 DIAGNOSIS — F419 Anxiety disorder, unspecified: Secondary | ICD-10-CM | POA: Diagnosis not present

## 2023-11-04 MED ORDER — SERTRALINE HCL 100 MG PO TABS
100.0000 mg | ORAL_TABLET | Freq: Every day | ORAL | 1 refills | Status: DC
Start: 2023-11-04 — End: 2024-03-01

## 2023-11-04 NOTE — Progress Notes (Signed)
 [facility] MD/PA/NP OP Progress Note  11/04/2023 9:06 PM [name]  MRN:  [id number]  Chief Complaint:  Chief Complaint  Patient presents with   Follow-up   Medication Refill   HPI:   [name] is a 38 year old, African-American female with a past psychiatric history significant for major depressive disorder and anxiety who presents to [facility] for follow-up and medication management.  Patient is currently being managed on the following medication: Sertraline 100 mg daily.  Patient presents to the encounter stating that she feels overwhelmed.  Despite feeling overwhelmed, patient reports that her use of sertraline to 100 mg daily has been helpful in managing her symptoms.  Patient continues to endorse depressive symptoms characterized by feeling overwhelmed and being overly emotional.  She reports that her depressive symptoms impact her day to day activities.  When overwhelmed, patient reports that she becomes easily agitated.  Patient states that she is often overwhelmed by multiple things happening around her at once.  She reports that she will occasionally feel like giving up with doing things that she needs to accomplish.  Patient is currently working at a new job [employer]).  Patient reports that she is easily overwhelmed while working at this major.  In addition to her new job, patient states that she has been having stressors related to her husband's health as well as assuming the responsibility of maintaining the household.  She reports that the stress sometimes gets to her to the point of having thoughts of running away from home.  Patient admits to not taking her medication consistently due to the medication causing drowsiness.  She reports that that she misses a dosage a few days out of the week.  A PHQ-9 screen was performed with the patient scoring an 11. A GAD-7 screen was also performed the patient scoring a 19.  Patient  is alert and oriented x 4, calm, cooperative, and fully engaged in conversation during the encounter.  Patient endorses all right mood stating that she is currently in a good place.  Patient exhibits depressed mood with appropriate affect.  Patient denies suicidal or homicidal ideations.  She further denies auditory or visual hallucinations and does not appear to be responding to internal/external stimuli.  Patient endorses fair sleep and receives on average 6 hours of sleep per night.  Patient endorses increased appetite stating that she occasionally engages in binge eating.  Patient reports that she has at least 2 meals per day.  Patient endorses alcohol consumption stating that she has 2 cups of alcohol once or twice a month.  Patient denies tobacco use or illicit drug use.  Visit Diagnosis:    ICD-10-CM   1. Moderate episode of recurrent major depressive disorder (HCC)  F33.1 sertraline (ZOLOFT) 100 MG tablet    2. Anxiety disorder, unspecified type  F41.9 sertraline (ZOLOFT) 100 MG tablet     Past Psychiatric History:  Patient endorses a past history depression and anxiety   Past Medical History:  Past Medical History:  Diagnosis Date   Asthma    COVID-19 07/04/2020   05/04/20   Diabetes mellitus without complication (HCC)    DKA (diabetic ketoacidoses) 08/27/2018   Family history of diabetes mellitus in mother 01/10/2018   Fibroid    pedunculated posterior RUQ near fundus. approx 3-4cm at time of 01/2016 c-section   GBS (group B streptococcus) infection    Hearing loss    pt states she was tested in high school and  told it was selective she says she continues to have a deficit and does not feel it is selective   Herpes    Herpes simplex vulvovaginitis 09/06/2020   Hypertension    Newly diagnosed diabetes (HCC) 01/10/2018   Diagnosed 01/10/18  A1c 7.5%   Sickle cell trait (HCC) 09/01/2014   [ ]  ucx q trimester   Trichomonas vaginitis     Past Surgical History:  Procedure Laterality  Date   CESAREAN SECTION N/A 01/08/2016   Procedure: CESAREAN SECTION;  Surgeon: Crabtree Bing, MD;  Location: [facility];  Service: Obstetrics;  Laterality: N/A;   CESAREAN SECTION N/A 09/10/2020   Procedure: CESAREAN SECTION;  Surgeon: Levie Heritage, DO;  Location: [facility] LD ORS;  Service: Obstetrics;  Laterality: N/A;   DILATION AND CURETTAGE OF UTERUS     TUBAL LIGATION  09/10/2020   Procedure: BILATERAL TUBAL LIGATION;  Surgeon: Levie Heritage, DO;  Location: [facility] LD ORS;  Service: Obstetrics;;   WISDOM TOOTH EXTRACTION      Family Psychiatric History:  Mother - patient reports that her mother states that she has "everything in the book" in regards to her mental health Sister - history of suicide and depression Brother (deceased) - depression   Family history of suicide attempt: Patient reports that her mother and sister have attempted suicide Family history of homicide attempt: Patient denies a family history of homicide attempts Family history of substance abuse: Patient reports that her mother abused drugs in the past  Family History:  Family History  Problem Relation Age of Onset   Diabetes Mother    Hypertension Mother    Hyperlipidemia Mother    Heart disease Mother    Breast cancer Mother        55   Ovarian cancer Mother        Unknown age   Diabetes Sister    Diabetes Brother    Breast cancer Cousin    Heart disease Maternal Grandmother     Social History:  Social History   Socioeconomic History   Marital status: Married    Spouse name: Not on file   Number of children: Not on file   Years of education: Not on file   Highest education level: Not on file  Occupational History   Not on file  Tobacco Use   Smoking status: Some Days    Types: Cigarettes, E-cigarettes   Smokeless tobacco: Never   Tobacco comments:    quit 2013 cigarettes, I vape every once in a blue moon.  Vaping Use   Vaping status: Never Used  Substance and Sexual Activity    Alcohol use: No   Drug use: No   Sexual activity: Yes    Birth control/protection: None  Other Topics Concern   Not on file  Social History Narrative   Not on file   Social Drivers of Health   Financial Resource Strain: Not on File (01/25/2023)   Received from [employer]    Financial Resource Strain: 0  Food Insecurity: Not on file (02/16/2023)  Transportation Needs: At Risk (02/16/2023)   Received from [facility] [employer] Needs    Transportation: 2  Physical Activity: Not on File (01/25/2023)   Received from [facility]   Physical Activity    Physical Activity: 0  Stress: Not on File (01/25/2023)   Received from [facility]   Stress    Stress: 0  Social Connections: Not on File (04/08/2023)   Received  from [facility]   Social Connections    Connectedness: 0    Allergies: No Known Allergies  Metabolic Disorder Labs: Lab Results  Component Value Date   HGBA1C 15.0 (A) 05/11/2023   MPG  09/25/2022     Comment:     eAG cannot be calculated. Hemoglobin A1c result exceeds the linearity of the assay. . HbA1c performed on Roche platform. Effective 05/11/22 a change in test platforms may have  shifted HbA1c results compared to historical results.    MPG 162.81 09/10/2020   No results found for: "PROLACTIN" Lab Results  Component Value Date   CHOL 185 09/25/2022   TRIG 82 09/25/2022   HDL 53 09/25/2022   CHOLHDL 3.5 09/25/2022   LDLCALC 114 (H) 09/25/2022   LDLCALC 138 (H) 12/05/2020   Lab Results  Component Value Date   TSH 1.13 09/25/2022   TSH 1.350 11/28/2020    Therapeutic Level Labs: No results found for: "LITHIUM" No results found for: "VALPROATE" No results found for: "CBMZ"  Current Medications: Current Outpatient Medications  Medication Sig Dispense Refill   Accu-Chek Softclix Lancets lancets Use to check blood sugar 3 times a day 300 each 5   albuterol (VENTOLIN HFA) 108 (90 Base) MCG/ACT inhaler INHALE 2 PUFFS INTO THE LUNGS EVERY 6  HOURS AS NEEDED FOR WHEEZING OR SHORTNESS OF BREATH 18 g 5   atorvastatin (LIPITOR) 20 MG tablet Take 1 tablet (20 mg total) by mouth daily. 90 tablet 1   Blood Glucose Monitoring Suppl (ACCU-CHEK GUIDE) w/Device KIT Use to check blood sugar 3 times a day 1 kit 0   Continuous Blood Gluc Receiver (FREESTYLE LIBRE 3 READER) DEVI 1 Device by Does not apply route in the morning, at noon, in the evening, and at bedtime. (Patient taking differently: 1 Device by Does not apply route See admin instructions. Use as directed, changing every 14 days.) 1 each 5   Continuous Glucose Sensor (FREESTYLE LIBRE 3 PLUS SENSOR) MISC Inject 1 Device into the skin for continuous blood glucose monitoring, change every 15 days. 6 each 3   cyclobenzaprine (FLEXERIL) 10 MG tablet Take 1 tablet (10 mg total) by mouth 2 (two) times daily as needed for muscle spasms. 20 tablet 0   Dulaglutide (TRULICITY) 1.5 MG/0.5ML SOAJ Inject 1.5 mg into the skin once a week. 6 mL 1   Glucagon (GVOKE HYPOPEN 1-PACK) 1 MG/0.2ML SOAJ Inject 1 mg into the skin as needed (low blood sugar with impaired consciousness). 0.4 mL 2   glucose blood (ACCU-CHEK GUIDE TEST) test strip Use to check blood sugar 3 times a day 300 each 5   Insulin Glargine (BASAGLAR KWIKPEN) 100 UNIT/ML Inject 45 Units into the skin daily. 15 mL 3   insulin glargine (LANTUS) 100 UNIT/ML injection Inject 45 Units into the skin at bedtime.     insulin isophane & regular human KwikPen (HUMULIN 70/30 KWIKPEN) (70-30) 100 UNIT/ML KwikPen Inject 22 Units into the skin See admin instructions. Before meals.     insulin lispro (HUMALOG) 100 UNIT/ML KwikPen Inject 15 Units into the skin 3 (three) times daily with meals. 45 mL 1   Insulin Pen Needle (AUM MINI INSULIN PEN NEEDLE) 32G X 6 MM MISC Use with Lantus nightly and NPH twice daily 100 each 11   lidocaine (LIDODERM) 5 % Place 1 patch onto the skin daily. Remove & Discard patch within 12 hours or as directed by MD 15 patch 0    lidocaine (LIDODERM) 5 % Place 1 patch  onto the skin daily. Remove & discard patch within 12 hours or as directed by MD. 30 patch 2   methocarbamol (ROBAXIN) 500 MG tablet Take 1 tablet (500 mg total) by mouth 2 (two) times daily. 20 tablet 0   oxyCODONE-acetaminophen (PERCOCET/ROXICET) 5-325 MG tablet Take 1 tablet by mouth every 4 (four) hours as needed for severe pain (pain score 7-10). 15 tablet 0   pregabalin (LYRICA) 50 MG capsule Take 1 capsule (50 mg total) by mouth 3 (three) times daily. 90 capsule 1   sertraline (ZOLOFT) 100 MG tablet Take 1 tablet (100 mg total) by mouth daily. 30 tablet 1   valACYclovir (VALTREX) 1000 MG tablet Take 1 tablet (1,000 mg total) by mouth daily for 5 days 5 tablet 2   No current facility-administered medications for this visit.     Musculoskeletal: Strength & Muscle Tone: within normal limits Gait & Station: normal Patient leans: N/A  Psychiatric Specialty Exam: Review of Systems  Psychiatric/Behavioral:  Positive for sleep disturbance. Negative for decreased concentration, dysphoric mood, hallucinations, self-injury and suicidal ideas. The patient is nervous/anxious. The patient is not hyperactive.     Blood pressure 133/80, pulse 93, temperature 98 F (36.7 C), temperature source Oral, height 5\' 8"  (1.727 m), weight 247 lb 12.8 oz (112.4 kg), SpO2 100%.Body mass index is 37.68 kg/m.  General Appearance: Casual  Eye Contact:  Good  Speech:  Clear and Coherent and Normal Rate  Volume:  Normal  Mood:  Anxious and Depressed  Affect:  Appropriate  Thought Process:  Coherent and Descriptions of Associations: Intact  Orientation:  Full (Time, Place, and Person)  Thought Content: WDL   Suicidal Thoughts:  No  Homicidal Thoughts:  No  Memory:  Immediate;   Good Recent;   Good Remote;   Good  Judgement:  Good  Insight:  Good  Psychomotor Activity:  Normal  Concentration:  Concentration: Good and Attention Span: Good  Recall:  Good  Fund of  Knowledge: Good  Language: Good  Akathisia:  No  Handed:  Right  AIMS (if indicated): not done  Assets:  Communication Skills Desire for Improvement Social Support  ADL's:  Intact  Cognition: WNL  Sleep:  Fair   Screenings: GAD-7    Flowsheet Row Clinical Support from 11/04/2023 in [facility] Video Visit from 08/06/2023 in [facility] Clinical Support from 06/23/2023 in [facility] Clinical Support from 04/14/2023 in [facility] Clinical Support from 02/10/2023 in [facility]  Total GAD-7 Score 19 7 18 19 17       PHQ2-9    Flowsheet Row Clinical Support from 11/04/2023 in [facility] Video Visit from 08/06/2023 in [facility] Clinical Support from 06/23/2023 in [facility] Clinical Support from 04/14/2023 in [facility] Clinical Support from 02/10/2023 in [facility]  PHQ-2 Total Score 3 1 1 3 2   PHQ-9 Total Score 11 -- -- 17 10      Flowsheet Row Clinical Support from 11/04/2023 in [facility] Video Visit from 08/06/2023 in [facility] Clinical Support from 06/23/2023 in [facility]  C-SSRS RISK CATEGORY No Risk No Risk No Risk        Assessment and Plan:   [name] is a 38 year old, African-American female with a past psychiatric history significant for  major depressive disorder and anxiety who presents to [facility] for follow-up and medication management.  Patient presents to the encounter stating that she has been experiencing depressive symptoms characterized by feeling overwhelmed and being overly emotional.  Patient reports that she has been overwhelmed due to her  new job, stressors related to her husband's health, and assuming responsibility of taking care of the household.  Despite feeling overwhelmed, patient reports that her use of sertraline has been helpful in maintaining her mood.  She does report that she occasionally does not take a dose of sertraline due to the medication causing her drowsiness.  Provider recommended patient take her sertraline promptly after getting off work.  Patient was agreeable to recommendation.  Patient's medication to be e-prescribed through pharmacy of choice.  Collaboration of Care: Collaboration of Care: Medication Management AEB provider managing patient's psychiatric medications, Psychiatrist AEB patient being seen by mental health provider at this facility, and Referral or follow-up with counselor/therapist AEB patient being seen by a licensed clinical social worker at this facility  Patient/Guardian was advised Release of Information must be obtained prior to any record release in order to collaborate their care with an outside provider. Patient/Guardian was advised if they have not already done so to contact the registration department to sign all necessary forms in order for [place] to release information regarding their care.   Consent: Patient/Guardian gives verbal consent for treatment and assignment of benefits for services provided during this visit. Patient/Guardian expressed understanding and agreed to proceed.   1. Moderate episode of recurrent major depressive disorder (HCC)  - sertraline (ZOLOFT) 100 MG tablet; Take 1 tablet (100 mg total) by mouth daily.  Dispense: 30 tablet; Refill: 1  2. Anxiety disorder, unspecified type  - sertraline (ZOLOFT) 100 MG tablet; Take 1 tablet (100 mg total) by mouth daily.  Dispense: 30 tablet; Refill: 1  Patient to follow-up in 6 weeks Provider spent a total of 24 minutes with the patient/reviewing patient's chart  Meta Hatchet, PA 11/04/2023, 9:06 PM

## 2023-11-08 ENCOUNTER — Other Ambulatory Visit (HOSPITAL_COMMUNITY): Payer: Self-pay

## 2023-11-09 ENCOUNTER — Other Ambulatory Visit: Payer: Self-pay

## 2023-11-09 ENCOUNTER — Other Ambulatory Visit (HOSPITAL_COMMUNITY): Payer: Self-pay

## 2023-11-10 ENCOUNTER — Other Ambulatory Visit (HOSPITAL_COMMUNITY): Payer: Self-pay

## 2023-11-10 ENCOUNTER — Telehealth: Payer: Self-pay | Admitting: Pharmacy Technician

## 2023-11-10 NOTE — Telephone Encounter (Signed)
 Pharmacy Patient Advocate Encounter   Received notification from CoverMyMeds that prior authorization for FreeStyle Libre 3 Plus Sensor is required/requested.   Insurance verification completed.   The patient is insured through [facility] .   Prior Authorization form/request asks a question that requires your assistance. Please see the question below and advise accordingly. The PA will not be submitted until the necessary information is received.   **Does the patient have an appt coming up soon? PA will be denied if they have not been seen within the last 3 months.**

## 2023-11-10 NOTE — Telephone Encounter (Signed)
 LMx1 for patient to schedule appointment.

## 2023-11-11 NOTE — Telephone Encounter (Signed)
 LMx2 to call office to schedule follow up.

## 2023-11-12 NOTE — Telephone Encounter (Signed)
 LMx3 to schedule follow up appointment.

## 2023-11-13 ENCOUNTER — Other Ambulatory Visit (HOSPITAL_COMMUNITY): Payer: Self-pay

## 2023-11-16 ENCOUNTER — Other Ambulatory Visit (HOSPITAL_COMMUNITY): Payer: Self-pay

## 2023-11-19 ENCOUNTER — Other Ambulatory Visit (HOSPITAL_COMMUNITY): Payer: Self-pay

## 2023-11-19 ENCOUNTER — Other Ambulatory Visit: Payer: Self-pay

## 2023-11-20 ENCOUNTER — Other Ambulatory Visit (HOSPITAL_COMMUNITY): Payer: Self-pay

## 2023-11-23 ENCOUNTER — Other Ambulatory Visit (HOSPITAL_COMMUNITY): Payer: Self-pay

## 2023-11-23 MED ORDER — BASAGLAR KWIKPEN 100 UNIT/ML ~~LOC~~ SOPN
50.0000 [IU] | PEN_INJECTOR | Freq: Every day | SUBCUTANEOUS | 3 refills | Status: DC
  Filled 2023-11-23: qty 15, 30d supply, fill #0
  Filled 2023-12-29: qty 15, 30d supply, fill #1
  Filled 2024-03-08: qty 15, 30d supply, fill #2

## 2023-11-23 NOTE — Telephone Encounter (Signed)
 Requested Prescriptions   Pending Prescriptions Disp Refills   Insulin  Glargine (BASAGLAR  KWIKPEN) 100 UNIT/ML 45 mL 3    Sig: Inject 50 Units into the skin daily.

## 2023-11-23 NOTE — Telephone Encounter (Signed)
 Patient returned call and was scheduled for a follow up on  05.30.25 - she was placed on the wait list for an earlier appointment

## 2023-11-24 ENCOUNTER — Other Ambulatory Visit (HOSPITAL_COMMUNITY): Payer: Self-pay

## 2023-11-25 ENCOUNTER — Other Ambulatory Visit: Payer: Self-pay

## 2023-11-26 ENCOUNTER — Other Ambulatory Visit: Payer: Self-pay

## 2023-11-29 ENCOUNTER — Other Ambulatory Visit: Payer: Self-pay

## 2023-11-29 ENCOUNTER — Other Ambulatory Visit (HOSPITAL_COMMUNITY): Payer: Self-pay

## 2023-11-29 ENCOUNTER — Ambulatory Visit (INDEPENDENT_AMBULATORY_CARE_PROVIDER_SITE_OTHER): Admitting: "Endocrinology

## 2023-11-29 ENCOUNTER — Telehealth: Payer: Self-pay

## 2023-11-29 ENCOUNTER — Ambulatory Visit: Admitting: "Endocrinology

## 2023-11-29 ENCOUNTER — Encounter: Payer: Self-pay | Admitting: "Endocrinology

## 2023-11-29 VITALS — BP 116/80 | HR 97 | Ht 68.0 in | Wt 250.0 lb

## 2023-11-29 DIAGNOSIS — E1165 Type 2 diabetes mellitus with hyperglycemia: Secondary | ICD-10-CM | POA: Diagnosis not present

## 2023-11-29 DIAGNOSIS — E7800 Pure hypercholesterolemia, unspecified: Secondary | ICD-10-CM | POA: Diagnosis not present

## 2023-11-29 DIAGNOSIS — Z794 Long term (current) use of insulin: Secondary | ICD-10-CM

## 2023-11-29 DIAGNOSIS — Z7985 Long-term (current) use of injectable non-insulin antidiabetic drugs: Secondary | ICD-10-CM | POA: Diagnosis not present

## 2023-11-29 LAB — POCT GLYCOSYLATED HEMOGLOBIN (HGB A1C): Hemoglobin A1C: 13 % — AB (ref 4.0–5.6)

## 2023-11-29 MED ORDER — FREESTYLE LIBRE 3 PLUS SENSOR MISC
1.0000 | 3 refills | Status: DC
  Filled 2023-11-29: qty 5, 75d supply, fill #0
  Filled 2023-11-29 – 2023-12-29 (×2): qty 2, 30d supply, fill #0
  Filled 2024-03-08: qty 2, 28d supply, fill #0
  Filled 2024-03-24 (×2): qty 2, 30d supply, fill #0

## 2023-11-29 MED ORDER — TRULICITY 3 MG/0.5ML ~~LOC~~ SOAJ
3.0000 mg | SUBCUTANEOUS | 6 refills | Status: DC
  Filled 2023-11-29 – 2023-12-29 (×2): qty 2, 28d supply, fill #0

## 2023-11-29 MED ORDER — TRULICITY 3 MG/0.5ML ~~LOC~~ SOAJ: 3.0000 mg | SUBCUTANEOUS | 0 refills | Status: DC

## 2023-11-29 MED ORDER — TRULICITY 3 MG/0.5ML ~~LOC~~ SOAJ
3.0000 mg | SUBCUTANEOUS | 6 refills | Status: DC
  Filled 2023-11-29: qty 2, 28d supply, fill #0

## 2023-11-29 NOTE — Patient Instructions (Addendum)
 Will recommend the following: Basaglar  50 units every day. Increase by 1 unit every day until fasting blood sugar is less than 140. Stay on that dose.   Novolog  22 units three times a day 15 min before each meal  Trulicity  3 mg weekly  ____________   Goals of DM therapy:  Morning Fasting blood sugar: 80-140  Blood sugar before meals: 80-140 Bed time blood sugar: 100-150  A1C <7%, limited only by hypoglycemia  1.Diabetes medications and their side effects discussed, including hypoglycemia    2. Check blood glucose:  a) Always check blood sugars before driving. Please see below (under hypoglycemia) on how to manage b) Check a minimum of 3 times/day or more as needed when having symptoms of hypoglycemia.   c) Try to check blood glucose before sleeping/in the middle of the night to ensure that it is remaining stable and not dropping less than 100 d) Check blood glucose more often if sick  3. Diet: a) 3 meals per day schedule b: Restrict carbs to 60-70 grams (4 servings) per meal c) Colorful vegetables - 3 servings a day, and low sugar fruit 2 servings/day Plate control method: 1/4 plate protein, 1/4 starch, 1/2 green, yellow, or red vegetables d) Avoid carbohydrate snacks unless hypoglycemic episode, or increased physical activity  4. Regular exercise as tolerated, preferably 3 or more hours a week  5. Hypoglycemia: a)  Do not drive or operate machinery without first testing blood glucose to assure it is over 90 mg%, or if dizzy, lightheaded, not feeling normal, etc, or  if foot or leg is numb or weak. b)  If blood glucose less than 70, take four 5gm Glucose tabs or 15-30 gm Glucose gel.  Repeat every 15 min as needed until blood sugar is >100 mg/dl. If hypoglycemia persists then call 911.   6. Sick day management: a) Check blood glucose more often b) Continue usual therapy if blood sugars are elevated.   7. Contact the doctor immediately if blood glucose is frequently <60  mg/dl, or an episode of severe hypoglycemia occurs (where someone had to give you glucose/  glucagon  or if you passed out from a low blood glucose), or if blood glucose is persistently >350 mg/dl, for further management  8. A change in level of physical activity or exercise and a change in diet may also affect your blood sugar. Check blood sugars more often and call if needed.  Instructions: 1. Bring glucose meter, blood glucose records on every visit for review 2. Continue to follow up with primary care physician and other providers for medical care 3. Yearly eye  and foot exam 4. Please get blood work done prior to the next appointment
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
## 2023-12-01 NOTE — Telephone Encounter (Signed)
 Error

## 2023-12-02 ENCOUNTER — Other Ambulatory Visit (HOSPITAL_COMMUNITY): Payer: Self-pay

## 2023-12-02 ENCOUNTER — Other Ambulatory Visit: Payer: Self-pay

## 2023-12-09 ENCOUNTER — Other Ambulatory Visit (HOSPITAL_COMMUNITY): Payer: Self-pay

## 2023-12-16 ENCOUNTER — Encounter (HOSPITAL_COMMUNITY): Admitting: Physician Assistant

## 2023-12-22 ENCOUNTER — Telehealth (INDEPENDENT_AMBULATORY_CARE_PROVIDER_SITE_OTHER): Admitting: Physician Assistant

## 2023-12-22 ENCOUNTER — Encounter (HOSPITAL_COMMUNITY): Payer: Self-pay | Admitting: Physician Assistant

## 2023-12-22 DIAGNOSIS — R41840 Attention and concentration deficit: Secondary | ICD-10-CM

## 2023-12-22 DIAGNOSIS — F331 Major depressive disorder, recurrent, moderate: Secondary | ICD-10-CM | POA: Diagnosis not present

## 2023-12-22 DIAGNOSIS — F419 Anxiety disorder, unspecified: Secondary | ICD-10-CM

## 2023-12-22 MED ORDER — ESCITALOPRAM OXALATE 10 MG PO TABS: 10.0000 mg | ORAL_TABLET | Freq: Every day | ORAL | 1 refills | Status: DC

## 2023-12-22 MED ORDER — ATOMOXETINE HCL 40 MG PO CAPS: 40.0000 mg | ORAL_CAPSULE | Freq: Every day | ORAL | 1 refills | Status: AC

## 2023-12-22 MED ORDER — ESCITALOPRAM OXALATE 5 MG PO TABS
ORAL_TABLET | ORAL | 0 refills | Status: DC
Start: 2023-12-22 — End: 2023-12-24

## 2023-12-22 NOTE — Progress Notes (Signed)
 [facility] MD/PA/NP OP Progress Note  Virtual Visit via Video Note  I connected with [name] on 12/22/23 at  3:00 PM EDT by a video enabled telemedicine application and verified that I am speaking with the correct person using two identifiers.  Location: Patient: Home Provider: Clinic   I discussed the limitations of evaluation and management by telemedicine and the availability of in person appointments. The patient expressed understanding and agreed to proceed.  Follow Up Instructions:  I discussed the assessment and treatment plan with the patient. The patient was provided an opportunity to ask questions and all were answered. The patient agreed with the plan and demonstrated an understanding of the instructions.   The patient was advised to call back or seek an in-person evaluation if the symptoms worsen or if the condition fails to improve as anticipated.  I provided 26 minutes of non-face-to-face time during this encounter.  Gates Kasal, PA    12/22/2023 9:17 PM [name]  MRN:  [id number]  Chief Complaint:  Chief Complaint  Patient presents with   Follow-up   Medication Management   HPI:   [name] is a 38 year old, African-American female with a past psychiatric history significant for major depressive disorder and anxiety who presents to [facility] for follow-up and medication management.  Patient is currently being managed on the following medication: Sertraline  100 mg daily.  Patient presents to the encounter stating that she has been taking her sertraline  but states that the use of the medication makes her drowsy.  She reports that she has tried taking sertraline  at bedtime in the past but states that the drowsiness lingers into the next day.  Patient denies experiencing any other side effects from her use of sertraline .  Patient also reports that she has been having issues with focus and  concentration.  Patient endorses difficulty completing tasks, issues with time management, difficulty with organization, issues with social interactions, and being easily frustrated and irritable.  She denies difficulty sitting still, impulsive actions, interrupting others, difficulty waiting her turn, and taking unnecessary tasks.  Patient reports that she has been diagnosed with ADHD in the past but has no documentation.  Patient continues to endorse depression and rates her depression at 10 out of 10 with 10 being most severe.  Patient endorses depressive episodes 5 days out of the week.  Patient endorses the following depressive symptoms: feelings of sadness, lack of motivation, decreased concentration, irritability, and decreased energy.  Patient denies feelings of guilt/worthlessness or hopelessness.  Patient's current stressors include work, difficulty getting her house in order, and her ongoing sciatica.  A PHQ-9 screen was performed with the patient scoring a 17.  A GAD-7 screen was also performed with the patient scoring an 18.  Patient is alert and oriented x 4, calm, cooperative, and fully engaged in conversation during the encounter.  Patient describes her mood as "blah and uninterested."  Patient exhibits depressed mood with congruent affect.  Patient denies suicidal or homicidal ideations.  She further denies auditory or visual hallucinations and does not appear to be responding to internal/external stimuli.  Patient endorses fair sleep and receives on average 5 to 6 hours of sleep per night.  Patient endorses good appetite needs on average 2 meals per day.  Patient denies alcohol consumption, tobacco use, or illicit drug use.  Visit Diagnosis:    ICD-10-CM   1. Attention and concentration deficit  R41.840 atomoxetine  (STRATTERA ) 40 MG capsule  2. Moderate episode of recurrent major depressive disorder (HCC)  F33.1 escitalopram  (LEXAPRO ) 5 MG tablet    escitalopram  (LEXAPRO ) 10 MG tablet     3. Anxiety disorder, unspecified type  F41.9 escitalopram  (LEXAPRO ) 5 MG tablet    escitalopram  (LEXAPRO ) 10 MG tablet     Past Psychiatric History:  Patient endorses a past history depression and anxiety   Past Medical History:  Past Medical History:  Diagnosis Date   Asthma    COVID-19 07/04/2020   05/04/20   Diabetes mellitus without complication (HCC)    DKA (diabetic ketoacidoses) 08/27/2018   Family history of diabetes mellitus in mother 01/10/2018   Fibroid    pedunculated posterior RUQ near fundus. approx 3-4cm at time of 01/2016 c-section   GBS (group B streptococcus) infection    Hearing loss    pt states she was tested in high school and told it was selective she says she continues to have a deficit and does not feel it is selective   Herpes    Herpes simplex vulvovaginitis 09/06/2020   Hypertension    Newly diagnosed diabetes (HCC) 01/10/2018   Diagnosed 01/10/18  A1c 7.5%   Sickle cell trait (HCC) 09/01/2014   [ ]  ucx q trimester   Trichomonas vaginitis     Past Surgical History:  Procedure Laterality Date   CESAREAN SECTION N/A 01/08/2016   Procedure: CESAREAN SECTION;  Surgeon: Raynell Caller, MD;  Location: [facility] BIRTHING SUITES;  Service: Obstetrics;  Laterality: N/A;   CESAREAN SECTION N/A 09/10/2020   Procedure: CESAREAN SECTION;  Surgeon: Malka Sea, DO;  Location: [facility] LD ORS;  Service: Obstetrics;  Laterality: N/A;   DILATION AND CURETTAGE OF UTERUS     TUBAL LIGATION  09/10/2020   Procedure: BILATERAL TUBAL LIGATION;  Surgeon: Malka Sea, DO;  Location: [facility] LD ORS;  Service: Obstetrics;;   WISDOM TOOTH EXTRACTION      Family Psychiatric History:  Mother - patient reports that her mother states that she has "everything in the book" in regards to her mental health Sister - history of suicide and depression Brother (deceased) - depression   Family history of suicide attempt: Patient reports that her mother and sister have attempted suicide Family  history of homicide attempt: Patient denies a family history of homicide attempts Family history of substance abuse: Patient reports that her mother abused drugs in the past  Family History:  Family History  Problem Relation Age of Onset   Diabetes Mother    Hypertension Mother    Hyperlipidemia Mother    Heart disease Mother    Breast cancer Mother        11   Ovarian cancer Mother        Unknown age   Diabetes Sister    Diabetes Brother    Breast cancer Cousin    Heart disease Maternal Grandmother     Social History:  Social History   Socioeconomic History   Marital status: Married    Spouse name: Not on file   Number of children: Not on file   Years of education: Not on file   Highest education level: Not on file  Occupational History   Not on file  Tobacco Use   Smoking status: Some Days    Types: Cigarettes, E-cigarettes   Smokeless tobacco: Never   Tobacco comments:    quit 2013 cigarettes, I vape every once in a blue moon.  Vaping Use   Vaping status: Never Used  Substance  and Sexual Activity   Alcohol use: No   Drug use: No   Sexual activity: Yes    Birth control/protection: None  Other Topics Concern   Not on file  Social History Narrative   Not on file   Social Drivers of Health   Financial Resource Strain: Not on File (01/25/2023)   Received from [employer]    Financial Resource Strain: 0  Food Insecurity: Not on file (02/16/2023)  Transportation Needs: At Risk (02/16/2023)   Received from [employer]: 2  Physical Activity: Not on File (01/25/2023)   Received from [facility]   Physical Activity    Physical Activity: 0  Stress: Not on File (01/25/2023)   [facility]   Stress    Stress: 0  Social Connections: Not on File (04/08/2023)   [facility] from [facility]   Social Connections    Connectedness: 0    Allergies: No Known Allergies  Metabolic Disorder Labs: Lab Results  Component  Value Date   HGBA1C 13.0 (A) 11/29/2023   MPG  09/25/2022     Comment:     eAG cannot be calculated. Hemoglobin A1c result exceeds the linearity of the assay. . HbA1c performed on Roche platform. Effective 05/11/22 a change in test platforms may have  shifted HbA1c results compared to historical results.    MPG 162.81 09/10/2020   No results found for: "PROLACTIN" Lab Results  Component Value Date   CHOL 185 09/25/2022   TRIG 82 09/25/2022   HDL 53 09/25/2022   CHOLHDL 3.5 09/25/2022   LDLCALC 114 (H) 09/25/2022   LDLCALC 138 (H) 12/05/2020   Lab Results  Component Value Date   TSH 1.13 09/25/2022   TSH 1.350 11/28/2020    Therapeutic Level Labs: No results found for: "LITHIUM" No results found for: "VALPROATE" No results found for: "CBMZ"  Current Medications: Current Outpatient Medications  Medication Sig Dispense Refill   atomoxetine  (STRATTERA ) 40 MG capsule Take 1 capsule (40 mg total) by mouth daily. 30 capsule 1   [START ON 01/21/2024] escitalopram  (LEXAPRO ) 10 MG tablet Take 1 tablet (10 mg total) by mouth daily. 30 tablet 1   escitalopram  (LEXAPRO ) 5 MG tablet Take 1 tablet (5 mg total) by mouth daily for 6 days, THEN 2 tablets (10 mg total) daily for 24 days. 54 tablet 0   Accu-Chek Softclix Lancets lancets Use to check blood sugar 3 times a day 300 each 5   albuterol  (VENTOLIN  HFA) 108 (90 Base) MCG/ACT inhaler INHALE 2 PUFFS INTO THE LUNGS EVERY 6 HOURS AS NEEDED FOR WHEEZING OR SHORTNESS OF BREATH 18 g 5   atorvastatin  (LIPITOR) 20 MG tablet Take 1 tablet (20 mg total) by mouth daily. 90 tablet 1   Blood Glucose Monitoring Suppl (ACCU-CHEK GUIDE) w/Device KIT Use to check blood sugar 3 times a day 1 kit 0   Continuous Blood Gluc Receiver (FREESTYLE LIBRE 3 READER) DEVI 1 Device by Does not apply route in the morning, at noon, in the evening, and at bedtime. (Patient taking differently: 1 Device by Does not apply route See admin instructions. Use as directed,  changing every 14 days.) 1 each 5   Continuous Glucose Sensor (FREESTYLE LIBRE 3 PLUS SENSOR) MISC Inject 1 sensor into the skin for continuous blood glucose monitoring, change every 15 days. 6 each 3   cyclobenzaprine  (FLEXERIL ) 10 MG tablet Take 1 tablet (10 mg total) by mouth 2 (two) times  daily as needed for muscle spasms. 20 tablet 0   Dulaglutide  (TRULICITY ) 3 MG/0.5ML SOAJ Inject 3 mg into the skin once a week. 2 mL 6   Dulaglutide  (TRULICITY ) 3 MG/0.5ML SOAJ Inject 3 mg into the skin once a week. 2 mL 0   Glucagon  (GVOKE HYPOPEN  1-PACK) 1 MG/0.2ML SOAJ Inject 1 mg into the skin as needed (low blood sugar with impaired consciousness). 0.4 mL 2   glucose blood (ACCU-CHEK GUIDE TEST) test strip Use to check blood sugar 3 times a day 300 each 5   Insulin  Glargine (BASAGLAR  KWIKPEN) 100 UNIT/ML Inject 50 Units into the skin daily. 45 mL 3   insulin  glargine (LANTUS ) 100 UNIT/ML injection Inject 45 Units into the skin at bedtime. (Patient not taking: Reported on 11/29/2023)     insulin  isophane & regular human KwikPen (HUMULIN  70/30 KWIKPEN) (70-30) 100 UNIT/ML KwikPen Inject 22 Units into the skin See admin instructions. Before meals. (Patient not taking: Reported on 11/29/2023)     insulin  lispro (HUMALOG ) 100 UNIT/ML KwikPen Inject 15 Units into the skin 3 (three) times daily with meals. (Patient taking differently: Inject 24 Units into the skin 3 (three) times daily with meals.) 45 mL 1   Insulin  Pen Needle (AUM MINI INSULIN  PEN NEEDLE) 32G X 6 MM MISC Use with Lantus  nightly and NPH twice daily 100 each 11   lidocaine  (LIDODERM ) 5 % Place 1 patch onto the skin daily. Remove & Discard patch within 12 hours or as directed by MD 15 patch 0   lidocaine  (LIDODERM ) 5 % Place 1 patch onto the skin daily. Remove & discard patch within 12 hours or as directed by MD. 30 patch 2   methocarbamol  (ROBAXIN ) 500 MG tablet Take 1 tablet (500 mg total) by mouth 2 (two) times daily. 20 tablet 0    oxyCODONE -acetaminophen  (PERCOCET/ROXICET) 5-325 MG tablet Take 1 tablet by mouth every 4 (four) hours as needed for severe pain (pain score 7-10). 15 tablet 0   pregabalin  (LYRICA ) 50 MG capsule Take 1 capsule (50 mg total) by mouth 3 (three) times daily. 90 capsule 1   sertraline  (ZOLOFT ) 100 MG tablet Take 1 tablet (100 mg total) by mouth daily. 30 tablet 1   valACYclovir  (VALTREX ) 1000 MG tablet Take 1 tablet (1,000 mg total) by mouth daily for 5 days 5 tablet 2   No current facility-administered medications for this visit.     Musculoskeletal: Strength & Muscle Tone: within normal limits Gait & Station: normal Patient leans: N/A  Psychiatric Specialty Exam: Review of Systems  Psychiatric/Behavioral:  Positive for decreased concentration, dysphoric mood and sleep disturbance. Negative for hallucinations, self-injury and suicidal ideas. The patient is nervous/anxious. The patient is not hyperactive.     There were no vitals taken for this visit.There is no height or weight on file to calculate BMI.  General Appearance: Casual  Eye Contact:  Good  Speech:  Clear and Coherent and Normal Rate  Volume:  Normal  Mood:  Anxious and Depressed  Affect:  Congruent  Thought Process:  Coherent and Descriptions of Associations: Intact  Orientation:  Full (Time, Place, and Person)  Thought Content: WDL   Suicidal Thoughts:  No  Homicidal Thoughts:  No  Memory:  Immediate;   Good Recent;   Good Remote;   Good  Judgement:  Good  Insight:  Good  Psychomotor Activity:  Normal  Concentration:  Concentration: Good and Attention Span: Good  Recall:  Good  Fund of Knowledge: Good  Language: Good  Akathisia:  No  Handed:  Right  AIMS (if indicated): not done  Assets:  Communication Skills Desire for Improvement Social Support  ADL's:  Intact  Cognition: WNL  Sleep:  Fair   Screenings: GAD-7    Flowsheet Row Video Visit [facility] 12/22/2023 in [facility]  Clinical Support from 11/04/2023 in [facility] Video Visit from 08/06/2023 in [facility] Clinical Support from 06/23/2023 in [facility] Clinical Support from 04/14/2023 in [facility]  Total GAD-7 Score 18 19 7 18 19       PHQ2-9    Flowsheet Row Video Visit from 12/22/2023 in [facility] Clinical Support from 11[facility]04/2023 in [facility] Video Visit from 08/06/2023 in [facility] Clinical Support from 06/23/2023 in [facility] Clinical Support from 04/14/2023 in [facility]  PHQ-2 Total Score 5 3 1 1 3   PHQ-9 Total Score 17 11 -- -- 17      Flowsheet Row Video Visit from 12/22/2023 in [facility] Clinical Support from 11/04/2023 in [facility] Video Visit from 08/06/2023 in [facility]  C-SSRS RISK CATEGORY No Risk No Risk No Risk        Assessment and Plan:   Domanique Huesman. Schweppe is a 38 year old, African-American female with a past psychiatric history significant for major depressive disorder and anxiety who presents to [facility] via virtual video visit for follow-up and medication management.  Patient presents to the encounter stating that she has been experiencing drowsiness from the use of her sertraline .  Patient reports that she has tried taking her sertraline  at bedtime but states that the drowsiness still continues to linger on the next day.  Patient reports that her therapist suggested that she be placed on a stimulant to help with her drowsiness.  Patient admits to having a history of ADHD but denies having any documentation to confirm her diagnosis.  Patient endorses the following symptoms difficulty completing  tasks, difficulty with her management, and difficulty with organization.  She denies impulsivity, interrupting others, difficulty waiting her turn, or taking unnecessary risks.  Provider informed patient that she would not be placed on a stimulant until she got an updated ADHD diagnosis.  Provider recommended patient be placed on Strattera  40 mg daily for the management of her focus and concentration.  Patient was agreeable to recommendation.   Patient to be referred to [facility] for ADHD screening.  Provider recommended patient discontinue her use of sertraline  due to experiencing drowsiness.  Provider recommended patient be placed on escitalopram  5 mg for 6 days, followed by 10 mg daily for the management of her depressive symptoms and anxiety.  Patient was agreeable to recommendation.  Patient reports that she has not taken her sertraline  in a week and therefore does not need to taper prior to using escitalopram .  Patient's medications to be e-prescribed to pharmacy of choice.  Patient denies suicidal ideations and is able to contract for safety following the conclusion of the encounter.  Collaboration of Care: Collaboration of Care: Medication Management AEB provider managing patient's psychiatric medications, Psychiatrist AEB patient being seen by mental health provider at this facility, and Referral or follow-up with counselor/therapist AEB patient being seen by a licensed clinical social worker at this facility  Patient/Guardian was  advised Release of Information must be obtained prior to any record release in order to collaborate their care with an outside provider. Patient/Guardian was advised if they have not already done so to contact the registration department to sign all necessary forms in order for us  to release information regarding their care.   Consent: Patient/Guardian gives verbal consent for treatment and assignment of benefits for services provided during this visit.  Patient/Guardian expressed understanding and agreed to proceed.   1. Moderate episode of recurrent major depressive disorder (HCC)  - escitalopram  (LEXAPRO ) 5 MG tablet; Take 1 tablet (5 mg total) by mouth daily for 6 days, THEN 2 tablets (10 mg total) daily for 24 days.  Dispense: 54 tablet; Refill: 0 - escitalopram  (LEXAPRO ) 10 MG tablet; Take 1 tablet (10 mg total) by mouth daily.  Dispense: 30 tablet; Refill: 1  2. Anxiety disorder, unspecified type  - escitalopram  (LEXAPRO ) 5 MG tablet; Take 1 tablet (5 mg total) by mouth daily for 6 days, THEN 2 tablets (10 mg total) daily for 24 days.  Dispense: 54 tablet; Refill: 0 - escitalopram  (LEXAPRO ) 10 MG tablet; Take 1 tablet (10 mg total) by mouth daily.  Dispense: 30 tablet; Refill: 1  3. Attention and concentration deficit (Primary) Patient to be referred to [facility] for ADHD screening  - atomoxetine  (STRATTERA ) 40 MG capsule; Take 1 capsule (40 mg total) by mouth daily.  Dispense: 30 capsule; Refill: 1  Patient to follow-up in 6 weeks Provider spent a total of 26 minutes with the patient/reviewing patient's chart  Gates Kasal, PA 12/22/2023, 9:17 PM

## 2023-12-23 ENCOUNTER — Ambulatory Visit: Admitting: Obstetrics & Gynecology

## 2023-12-23 ENCOUNTER — Telehealth (HOSPITAL_COMMUNITY): Payer: Self-pay | Admitting: *Deleted

## 2023-12-23 NOTE — Telephone Encounter (Signed)
 Pharmacy faxed PA for Lexapro 5mg . Called [employer] and was told that quantity limit of 30 for 30 has been exceeded in current prescription. States that provider can write for 6 day supply of 5mg  Lexapro and then it will be approved. Patient can then fill 10mg  Lexapro after the 6 day supply.

## 2023-12-24 MED ORDER — ESCITALOPRAM OXALATE 5 MG PO TABS: ORAL_TABLET | ORAL | 0 refills | Status: DC

## 2023-12-24 MED ORDER — ESCITALOPRAM OXALATE 10 MG PO TABS: 10.0000 mg | ORAL_TABLET | Freq: Every day | ORAL | 1 refills | Status: DC

## 2023-12-28 ENCOUNTER — Emergency Department (HOSPITAL_COMMUNITY)
Admission: EM | Admit: 2023-12-28 | Discharge: 2023-12-28 | Disposition: A | Discharge status: Home / Self Care | Payer: MEDICAID | Attending: Emergency Medicine | Admitting: Emergency Medicine

## 2023-12-28 ENCOUNTER — Encounter (HOSPITAL_COMMUNITY): Payer: Self-pay

## 2023-12-28 ENCOUNTER — Other Ambulatory Visit: Payer: Self-pay

## 2023-12-28 DIAGNOSIS — E119 Type 2 diabetes mellitus without complications: Secondary | ICD-10-CM | POA: Insufficient documentation

## 2023-12-28 DIAGNOSIS — R112 Nausea with vomiting, unspecified: Secondary | ICD-10-CM | POA: Insufficient documentation

## 2023-12-28 DIAGNOSIS — D649 Anemia, unspecified: Secondary | ICD-10-CM | POA: Insufficient documentation

## 2023-12-28 DIAGNOSIS — J45909 Unspecified asthma, uncomplicated: Secondary | ICD-10-CM | POA: Insufficient documentation

## 2023-12-28 DIAGNOSIS — Z794 Long term (current) use of insulin: Secondary | ICD-10-CM | POA: Insufficient documentation

## 2023-12-28 DIAGNOSIS — Z7984 Long term (current) use of oral hypoglycemic drugs: Secondary | ICD-10-CM | POA: Diagnosis not present

## 2023-12-28 LAB — COMPREHENSIVE METABOLIC PANEL WITH GFR
ALT: 14 U/L (ref 0–44)
AST: 14 U/L — ABNORMAL LOW (ref 15–41)
Albumin: 4.1 g/dL (ref 3.5–5.0)
Alkaline Phosphatase: 97 U/L (ref 38–126)
Anion gap: 11 (ref 5–15)
BUN: 10 mg/dL (ref 6–20)
CO2: 22 mmol/L (ref 22–32)
Calcium: 9.3 mg/dL (ref 8.9–10.3)
Chloride: 102 mmol/L (ref 98–111)
Creatinine, Ser: 0.84 mg/dL (ref 0.44–1.00)
GFR, Estimated: >60 mL/min (ref >60)
Glucose, Bld: 180 mg/dL — ABNORMAL HIGH (ref 70–99)
Potassium: 3.5 mmol/L (ref 3.5–5.1)
Sodium: 135 mmol/L (ref 135–145)
Total Bilirubin: 0.6 mg/dL (ref 0.0–1.2)
Total Protein: 7.6 g/dL (ref 6.5–8.1)

## 2023-12-28 LAB — URINALYSIS, ROUTINE W REFLEX MICROSCOPIC
Bacteria, UA: NONE SEEN
Bilirubin Urine: NEGATIVE
Glucose, UA: >=500 mg/dL — AB
Hgb urine dipstick: NEGATIVE
Ketones, ur: 5 mg/dL — AB
Nitrite: NEGATIVE
Protein, ur: NEGATIVE mg/dL
Specific Gravity, Urine: 1.015 (ref 1.005–1.030)
pH: 5 (ref 5.0–8.0)

## 2023-12-28 LAB — CBC
HCT: 33.9 % — ABNORMAL LOW (ref 36.0–46.0)
Hemoglobin: 10.2 g/dL — ABNORMAL LOW (ref 12.0–15.0)
MCH: 21.5 pg — ABNORMAL LOW (ref 26.0–34.0)
MCHC: 30.1 g/dL (ref 30.0–36.0)
MCV: 71.4 fL — ABNORMAL LOW (ref 80.0–100.0)
Platelets: 336 10*3/uL (ref 150–400)
RBC: 4.75 MIL/uL (ref 3.87–5.11)
RDW: 16.2 % — ABNORMAL HIGH (ref 11.5–15.5)
WBC: 5.8 10*3/uL (ref 4.0–10.5)
nRBC: 0 % (ref 0.0–0.2)

## 2023-12-28 LAB — LIPASE, BLOOD: Lipase: 30 U/L (ref 11–51)

## 2023-12-28 LAB — HCG, SERUM, QUALITATIVE: Preg, Serum: NEGATIVE

## 2023-12-28 LAB — CBG MONITORING, ED: Glucose-Capillary: 149 mg/dL — ABNORMAL HIGH (ref 70–99)

## 2023-12-28 MED ORDER — ONDANSETRON 4 MG PO TBDP: 4.0000 mg | ORAL_TABLET | Freq: Three times a day (TID) | ORAL | 0 refills | Status: DC | PRN

## 2023-12-28 MED ORDER — ONDANSETRON 4 MG PO TBDP
4.0000 mg | ORAL_TABLET | Freq: Once | ORAL | Status: AC
  Administered 2023-12-28: 4 mg via ORAL
  Filled 2023-12-28: qty 1

## 2023-12-28 NOTE — ED Provider Notes (Signed)
 [place] EMERGENCY DEPARTMENT AT [facility] Provider Note   CSN: [id number] Arrival date & time: 12/28/23  1214     History  Chief Complaint  Patient presents with   Emesis    [name] is a 38 y.o. female past medical history significant for diabetes and asthma presents today for nausea and vomiting.  Patient states that she had approximately 6 episodes of vomiting while at work.  Patient denies abdominal pain, diarrhea, constipation, shortness of breath, chest pain, cough, congestion, fever, chills, or bodyaches.   Emesis      Home Medications Prior to Admission medications   Medication Sig Start Date End Date Taking? Authorizing Provider  ondansetron  (ZOFRAN -ODT) 4 MG disintegrating tablet Take 1 tablet (4 mg total) by mouth every 8 (eight) hours as needed for nausea or vomiting. 12/28/23  Yes Carie Charity, PA-C  Accu-Chek Softclix Lancets lancets Use to check blood sugar 3 times a day 08/26/23   Jorge Newcomer, MD  albuterol  (VENTOLIN  HFA) 108 (90 Base) MCG/ACT inhaler INHALE 2 PUFFS INTO THE LUNGS EVERY 6 HOURS AS NEEDED FOR WHEEZING OR SHORTNESS OF BREATH 02/12/23   Ngetich, Dinah C, NP  atomoxetine (STRATTERA) 40 MG capsule Take 1 capsule (40 mg total) by mouth daily. 12/22/23   Nwoko, Uchenna E, PA  atorvastatin  (LIPITOR) 20 MG tablet Take 1 tablet (20 mg total) by mouth daily. 09/15/22   Ngetich, Dinah C, NP  Blood Glucose Monitoring Suppl (ACCU-CHEK GUIDE) w/Device KIT Use to check blood sugar 3 times a day 08/26/23   Jorge Newcomer, MD  Continuous Blood Gluc Receiver (FREESTYLE LIBRE 3 READER) DEVI 1 Device by Does not apply route in the morning, at noon, in the evening, and at bedtime. Patient taking differently: 1 Device by Does not apply route See admin instructions. Use as directed, changing every 14 days. 09/15/22   Ngetich, Dinah C, NP  Continuous Glucose Sensor (FREESTYLE LIBRE 3 PLUS SENSOR) MISC Inject 1 sensor into the skin for continuous  blood glucose monitoring, change every 15 days. 11/29/23   Motwani, Komal, MD  cyclobenzaprine  (FLEXERIL ) 10 MG tablet Take 1 tablet (10 mg total) by mouth 2 (two) times daily as needed for muscle spasms. 06/02/23   Tegeler, Marine Sia, MD  Dulaglutide  (TRULICITY ) 3 MG/0.5ML SOAJ Inject 3 mg into the skin once a week. 11/29/23   Motwani, Komal, MD  Dulaglutide  (TRULICITY ) 3 MG/0.5ML SOAJ Inject 3 mg into the skin once a week. 11/29/23     escitalopram (LEXAPRO) 10 MG tablet Take 1 tablet (10 mg total) by mouth daily. 12/30/23   Nwoko, Uchenna E, PA  escitalopram (LEXAPRO) 5 MG tablet Patient to take 1 tablet (5 mg total) daily for 6 days. 12/24/23   Nwoko, Uchenna E, PA  Glucagon  (GVOKE HYPOPEN  1-PACK) 1 MG/0.2ML SOAJ Inject 1 mg into the skin as needed (low blood sugar with impaired consciousness). 03/03/23   Motwani, Komal, MD  glucose blood (ACCU-CHEK GUIDE TEST) test strip Use to check blood sugar 3 times a day 08/26/23   Motwani, Komal, MD  Insulin  Glargine (BASAGLAR  KWIKPEN) 100 UNIT/ML Inject 50 Units into the skin daily. 11/23/23 02/21/24  Motwani, Komal, MD  insulin  glargine (LANTUS ) 100 UNIT/ML injection Inject 45 Units into the skin at bedtime. Patient not taking: Reported on 11/29/2023    [provider]  insulin  isophane & regular human KwikPen (HUMULIN  70/30 KWIKPEN) (70-30) 100 UNIT/ML KwikPen Inject 22 Units into the skin See admin instructions. Before meals. Patient not  taking: Reported on 11/29/2023    [provider]  insulin  lispro (HUMALOG ) 100 UNIT/ML KwikPen Inject 15 Units into the skin 3 (three) times daily with meals. Patient taking differently: Inject 24 Units into the skin 3 (three) times daily with meals. 05/11/23 12/29/23  Motwani, Komal, MD  Insulin  Pen Needle (AUM MINI INSULIN  PEN NEEDLE) 32G X 6 MM MISC Use with Lantus  nightly and NPH twice daily 02/16/23     lidocaine  (LIDODERM ) 5 % Place 1 patch onto the skin daily. Remove & Discard patch within 12 hours or  as directed by MD 06/02/23   Tegeler, Marine Sia, MD  lidocaine  (LIDODERM ) 5 % Place 1 patch onto the skin daily. Remove & discard patch within 12 hours or as directed by MD. 07/12/23     methocarbamol  (ROBAXIN ) 500 MG tablet Take 1 tablet (500 mg total) by mouth 2 (two) times daily. 06/09/23   Aberman, Caroline C, PA-C  oxyCODONE -acetaminophen  (PERCOCET/ROXICET) 5-325 MG tablet Take 1 tablet by mouth every 4 (four) hours as needed for severe pain (pain score 7-10). 06/02/23   Tegeler, Marine Sia, MD  pregabalin  (LYRICA ) 50 MG capsule Take 1 capsule (50 mg total) by mouth 3 (three) times daily. 06/03/23     sertraline  (ZOLOFT ) 100 MG tablet Take 1 tablet (100 mg total) by mouth daily. 11/04/23 11/03/24  Nwoko, Uchenna E, PA  valACYclovir  (VALTREX ) 1000 MG tablet Take 1 tablet (1,000 mg total) by mouth daily for 5 days 05/27/23   Lacey Pian, MD  metFORMIN  (GLUCOPHAGE ) 1000 MG tablet Take 1 tablet (1,000 mg total) by mouth 2 (two) times daily with a meal. 09/13/20 10/14/20  Granville Layer, MD      Allergies    Patient has no known allergies.    Review of Systems   Review of Systems  Gastrointestinal:  Positive for nausea and vomiting.    Physical Exam Updated Vital Signs BP 120/64 (BP Location: Right Arm)   Pulse 95   Temp 97.9 F (36.6 C) (Oral)   Resp 16   SpO2 99%  Physical Exam Vitals and nursing note reviewed.  Constitutional:      General: She is not in acute distress.    Appearance: She is well-developed. She is not toxic-appearing or diaphoretic.  HENT:     Head: Normocephalic and atraumatic.     Right Ear: External ear normal.     Left Ear: External ear normal.     Nose: Nose normal.     Mouth/Throat:     Mouth: Mucous membranes are moist.     Pharynx: Oropharynx is clear.  Eyes:     Conjunctiva/sclera: Conjunctivae normal.  Cardiovascular:     Rate and Rhythm: Normal rate and regular rhythm.     Pulses: Normal pulses.     Heart sounds: Normal heart sounds. No  murmur heard. Pulmonary:     Effort: Pulmonary effort is normal. No respiratory distress.     Breath sounds: Normal breath sounds.  Abdominal:     General: There is no distension.     Palpations: Abdomen is soft.     Tenderness: There is no abdominal tenderness. There is no right CVA tenderness or left CVA tenderness.  Musculoskeletal:        General: No swelling.     Cervical back: Neck supple.  Skin:    General: Skin is warm and dry.     Capillary Refill: Capillary refill takes less than 2 seconds.  Neurological:  General: No focal deficit present.     Mental Status: She is alert and oriented to person, place, and time.  Psychiatric:        Mood and Affect: Mood normal.     ED Results / Procedures / Treatments   Labs (all labs ordered are listed, but only abnormal results are displayed) Labs Reviewed  COMPREHENSIVE METABOLIC PANEL WITH GFR - Abnormal; Notable for the following components:      Result Value   Glucose, Bld 180 (*)    AST 14 (*)    All other components within normal limits  CBC - Abnormal; Notable for the following components:   Hemoglobin 10.2 (*)    HCT 33.9 (*)    MCV 71.4 (*)    MCH 21.5 (*)    RDW 16.2 (*)    All other components within normal limits  URINALYSIS, ROUTINE W REFLEX MICROSCOPIC - Abnormal; Notable for the following components:   APPearance CLOUDY (*)    Glucose, UA >=500 (*)    Ketones, ur 5 (*)    Leukocytes,Ua SMALL (*)    All other components within normal limits  CBG MONITORING, ED - Abnormal; Notable for the following components:   Glucose-Capillary 149 (*)    All other components within normal limits  LIPASE, BLOOD  HCG, SERUM, QUALITATIVE    EKG None  Radiology No results found.  Procedures Procedures    Medications Ordered in ED Medications  ondansetron  (ZOFRAN -ODT) disintegrating tablet 4 mg (4 mg Oral Given 12/28/23 1407)    ED Course/ Medical Decision Making/ A&P                                  Medical Decision Making Amount and/or Complexity of Data Reviewed Labs: ordered.  Risk Prescription drug management.   This patient presents to the ED for concern of nausea and vomiting differential diagnosis includes viral GI illness, pancreatitis, appendicitis, choledocholithiasis, acute cholecystitis, DKA   Lab Tests:  I Ordered, and personally interpreted labs.  The pertinent results include: Mildly decreased AST, mild anemia at 10.2, negative pregnancy, greater than 500 glucose and urine, 5 ketones in urine, small leukocytes in urine   Medicines ordered and prescription drug management:  I ordered medication including Zofran     I have reviewed the patients home medicines and have made adjustments as needed   Problem List / ED Course:  Considered for admission or further workup however patient's vital signs, physical exam, and labs are reassuring.  Patient able to tolerate p.o. intake without issue prior to discharge.  Patient given return precautions.  Patient given outpatient course of Zofran  as needed for nausea and vomiting.  I feel patient is safe for discharge at this time.           Final Clinical Impression(s) / ED Diagnoses Final diagnoses:  Nausea and vomiting, unspecified vomiting type    Rx / DC Orders ED Discharge Orders          Ordered    ondansetron  (ZOFRAN -ODT) 4 MG disintegrating tablet  Every 8 hours PRN        12/28/23 1639              Carie Charity, PA-C 12/28/23 1726    Albertus Hughs, DO 12/30/23 0930

## 2023-12-28 NOTE — Discharge Instructions (Signed)
 Today you were seen for nausea and vomiting.  I suspect this is likely due to a viral GI illness.  Please return to the ED if you have vomiting and cannot keep any fluids down, fever that does not go down with Tylenol  or Motrin , or blood in your stool.  Thank you for letting us  treat you today. After reviewing your labs and imaging, I feel you are safe to go home. Please follow up with your PCP in the next several days and provide them with your records from this visit. Return to the Emergency Room if pain becomes severe or symptoms worsen.

## 2023-12-28 NOTE — ED Triage Notes (Signed)
 [facility] reports pt coming from work for N/V, had about 6 episodes while at work.

## 2023-12-29 ENCOUNTER — Other Ambulatory Visit: Payer: Self-pay | Admitting: "Endocrinology

## 2023-12-29 ENCOUNTER — Other Ambulatory Visit (HOSPITAL_COMMUNITY): Payer: Self-pay

## 2023-12-29 ENCOUNTER — Other Ambulatory Visit: Payer: Self-pay

## 2023-12-29 MED ORDER — INSULIN LISPRO (1 UNIT DIAL) 100 UNIT/ML (KWIKPEN)
15.0000 [IU] | PEN_INJECTOR | Freq: Three times a day (TID) | SUBCUTANEOUS | 1 refills | Status: DC
Start: 2023-12-29 — End: 2024-03-23
  Filled 2023-12-29: qty 12, 27d supply, fill #0
  Filled 2024-03-08: qty 12, 27d supply, fill #1

## 2023-12-29 NOTE — Telephone Encounter (Signed)
 Requested Prescriptions   Pending Prescriptions Disp Refills   insulin  lispro (HUMALOG ) 100 UNIT/ML KwikPen 45 mL 1    Sig: Inject 15 Units into the skin 3 (three) times daily with meals.

## 2023-12-29 NOTE — Telephone Encounter (Signed)
 Message acknowledged and reviewed.

## 2023-12-30 ENCOUNTER — Ambulatory Visit: Admitting: "Endocrinology

## 2023-12-30 ENCOUNTER — Other Ambulatory Visit: Payer: Self-pay

## 2023-12-30 ENCOUNTER — Ambulatory Visit: Payer: Commercial Managed Care - HMO | Admitting: Podiatry

## 2023-12-30 ENCOUNTER — Other Ambulatory Visit (HOSPITAL_COMMUNITY): Payer: Self-pay

## 2023-12-31 ENCOUNTER — Ambulatory Visit: Admitting: "Endocrinology

## 2024-01-06 ENCOUNTER — Other Ambulatory Visit: Payer: Self-pay

## 2024-01-06 ENCOUNTER — Other Ambulatory Visit (HOSPITAL_COMMUNITY): Payer: Self-pay

## 2024-01-07 ENCOUNTER — Other Ambulatory Visit (HOSPITAL_COMMUNITY): Payer: Self-pay

## 2024-01-07 ENCOUNTER — Ambulatory Visit (INDEPENDENT_AMBULATORY_CARE_PROVIDER_SITE_OTHER): Payer: MEDICAID | Admitting: Podiatry

## 2024-01-07 ENCOUNTER — Encounter: Payer: Self-pay | Admitting: Podiatry

## 2024-01-07 DIAGNOSIS — B353 Tinea pedis: Secondary | ICD-10-CM | POA: Diagnosis not present

## 2024-01-07 DIAGNOSIS — Z794 Long term (current) use of insulin: Secondary | ICD-10-CM | POA: Diagnosis not present

## 2024-01-07 DIAGNOSIS — E1142 Type 2 diabetes mellitus with diabetic polyneuropathy: Secondary | ICD-10-CM | POA: Diagnosis not present

## 2024-01-07 DIAGNOSIS — M79674 Pain in right toe(s): Secondary | ICD-10-CM | POA: Diagnosis not present

## 2024-01-07 DIAGNOSIS — B351 Tinea unguium: Secondary | ICD-10-CM

## 2024-01-07 DIAGNOSIS — M79675 Pain in left toe(s): Secondary | ICD-10-CM

## 2024-01-07 MED ORDER — TOLNAFTATE 1 % EX POWD
1.0000 | Freq: Two times a day (BID) | CUTANEOUS | 0 refills | Status: DC
  Filled 2024-01-07: qty 45, 23d supply, fill #0

## 2024-01-07 MED ORDER — GABAPENTIN 300 MG PO CAPS
300.0000 mg | ORAL_CAPSULE | Freq: Three times a day (TID) | ORAL | 3 refills | Status: DC
Start: 2024-01-07 — End: 2024-04-07
  Filled 2024-01-07: qty 90, 30d supply, fill #0
  Filled 2024-03-08 – 2024-03-30 (×2): qty 90, 30d supply, fill #1

## 2024-01-07 NOTE — Progress Notes (Signed)
  Subjective:  Patient ID: [name], female    DOB: June 14, 1986,  MRN: [id number]  Chief Complaint  Patient presents with   Diabetic foot care    Rm15/ [facility] / Blood sugar 140/ A1C 14/ Insulin     38 y.o. female presents with the above complaint. History confirmed with patient. Patient presenting for routine diabetic foot exam.  Presenting for nail trim for painful, thickened, toenails that are painful with certain shoe gear.  Continues to endorse neuropathic pain.  Her last A1c 13, diabetes has been poorly controlled.  Objective:  Physical Exam: warm, good capillary refill, dry pedal skin Onychomycosis of toenails with increased thickening, onycholysis, nail dystrophy. DP pulses palpable, PT pulses palpable, and protective sensation intact, vibratory sensation diminished Left Foot: No open wounds or hyperkeratotic lesions present. nails with thickening and dystrophy at this time painful Right Foot: No open wounds or hyperkeratotic lesions present. nails with thickening and dystrophy at this time painful Some maceration noted to the webspaces bilaterally.  Assessment:   1. Type 2 diabetes mellitus with diabetic polyneuropathy, with long-term current use of insulin  (HCC)   2. Pain due to onychomycosis of toenails of both feet   3. Tinea pedis of both feet        Plan:  Patient was evaluated and treated and all questions answered.  # DM2 with neuropathic pain -Patient educated on diabetes. Discussed proper diabetic foot care and discussed risks and complications of disease. Educated patient in depth on reasons to return to the office immediately should he/she discover anything concerning or new on the feet. All questions answered. Discussed proper shoes as well.  - Patient requesting trial of gabapentin  again, had tried this previously.  Will try gabapentin  300 mg 3 times daily. - Have previously discussed Qutenza with patient, patient concerned about out-of-pocket cost. -I  certify that this diagnosis represents a distinct and separate diagnosis that requires evaluation and treatment separate from other procedures or diagnosis   # Tinea pedis - Interdigital maceration present - Sending Rx to patient's pharmacy for tolnaftate  powder to be applied to the webspaces - Discussed importance of pedal hygiene in the treatment of tinea pedis - Follow-up within 3 to 4 weeks if interdigital maceration does not improve or worsens.   #Onychomycosis with pain  -Nails palliatively debrided as below. -Educated on self-care  Procedure: Nail Debridement Rationale: Pain Type of Debridement: manual, sharp debridement. Instrumentation: Nail nipper, rotary burr. Number of Nails: 10  Return in about 3 months (around 04/08/2024).         Reina Cara, DPM [facility] / [facility]

## 2024-01-09 ENCOUNTER — Encounter: Payer: Self-pay | Admitting: Podiatry

## 2024-01-10 ENCOUNTER — Other Ambulatory Visit (HOSPITAL_COMMUNITY): Payer: Self-pay

## 2024-01-11 ENCOUNTER — Other Ambulatory Visit (HOSPITAL_COMMUNITY): Payer: Self-pay

## 2024-01-12 ENCOUNTER — Other Ambulatory Visit (HOSPITAL_COMMUNITY): Payer: Self-pay

## 2024-01-12 ENCOUNTER — Other Ambulatory Visit: Payer: Self-pay

## 2024-01-15 ENCOUNTER — Encounter (HOSPITAL_COMMUNITY): Payer: Self-pay | Admitting: *Deleted

## 2024-01-15 ENCOUNTER — Emergency Department (HOSPITAL_COMMUNITY)
Admission: EM | Admit: 2024-01-15 | Discharge: 2024-01-15 | Disposition: A | Discharge status: Home / Self Care | Payer: MEDICAID | Attending: Emergency Medicine | Admitting: Emergency Medicine

## 2024-01-15 ENCOUNTER — Other Ambulatory Visit: Payer: Self-pay

## 2024-01-15 DIAGNOSIS — M5416 Radiculopathy, lumbar region: Secondary | ICD-10-CM | POA: Diagnosis not present

## 2024-01-15 DIAGNOSIS — J45909 Unspecified asthma, uncomplicated: Secondary | ICD-10-CM | POA: Insufficient documentation

## 2024-01-15 DIAGNOSIS — M549 Dorsalgia, unspecified: Secondary | ICD-10-CM | POA: Diagnosis present

## 2024-01-15 DIAGNOSIS — Z7984 Long term (current) use of oral hypoglycemic drugs: Secondary | ICD-10-CM | POA: Diagnosis not present

## 2024-01-15 DIAGNOSIS — E119 Type 2 diabetes mellitus without complications: Secondary | ICD-10-CM | POA: Insufficient documentation

## 2024-01-15 DIAGNOSIS — Z794 Long term (current) use of insulin: Secondary | ICD-10-CM | POA: Diagnosis not present

## 2024-01-15 DIAGNOSIS — I10 Essential (primary) hypertension: Secondary | ICD-10-CM | POA: Insufficient documentation

## 2024-01-15 LAB — CBC
HCT: 30.3 % — ABNORMAL LOW (ref 36.0–46.0)
Hemoglobin: 9.2 g/dL — ABNORMAL LOW (ref 12.0–15.0)
MCH: 21.1 pg — ABNORMAL LOW (ref 26.0–34.0)
MCHC: 30.4 g/dL (ref 30.0–36.0)
MCV: 69.5 fL — ABNORMAL LOW (ref 80.0–100.0)
Platelets: 377 10*3/uL (ref 150–400)
RBC: 4.36 MIL/uL (ref 3.87–5.11)
RDW: 16.4 % — ABNORMAL HIGH (ref 11.5–15.5)
WBC: 5.4 10*3/uL (ref 4.0–10.5)
nRBC: 0 % (ref 0.0–0.2)

## 2024-01-15 LAB — COMPREHENSIVE METABOLIC PANEL WITH GFR
ALT: 12 U/L (ref 0–44)
AST: 13 U/L — ABNORMAL LOW (ref 15–41)
Albumin: 3.5 g/dL (ref 3.5–5.0)
Alkaline Phosphatase: 78 U/L (ref 38–126)
Anion gap: 7 (ref 5–15)
BUN: 11 mg/dL (ref 6–20)
CO2: 26 mmol/L (ref 22–32)
Calcium: 8.9 mg/dL (ref 8.9–10.3)
Chloride: 103 mmol/L (ref 98–111)
Creatinine, Ser: 0.69 mg/dL (ref 0.44–1.00)
GFR, Estimated: >60 mL/min (ref >60)
Glucose, Bld: 134 mg/dL — ABNORMAL HIGH (ref 70–99)
Potassium: 3.7 mmol/L (ref 3.5–5.1)
Sodium: 136 mmol/L (ref 135–145)
Total Bilirubin: 0.3 mg/dL (ref 0.0–1.2)
Total Protein: 6.7 g/dL (ref 6.5–8.1)

## 2024-01-15 LAB — HCG, SERUM, QUALITATIVE: Preg, Serum: NEGATIVE

## 2024-01-15 LAB — LIPASE, BLOOD: Lipase: 36 U/L (ref 11–51)

## 2024-01-15 MED ORDER — CYCLOBENZAPRINE HCL 10 MG PO TABS: 10.0000 mg | ORAL_TABLET | Freq: Two times a day (BID) | ORAL | 1 refills | Status: AC | PRN

## 2024-01-15 MED ORDER — NAPROXEN 500 MG PO TABS: 500.0000 mg | ORAL_TABLET | Freq: Two times a day (BID) | ORAL | 1 refills | Status: AC

## 2024-01-15 NOTE — Discharge Instructions (Signed)
 Continue the gabapentin  but you can start the other medications and you might want to start some light stretching.  Also sitting down to start IV's would also be helpful.

## 2024-01-15 NOTE — ED Provider Notes (Signed)
 [place] EMERGENCY DEPARTMENT AT [facility] Provider Note   CSN: [id number] Arrival date & time: 01/15/24  1748     Patient presents with: Back Pain   [name] is a 38 y.o. female.   Patient is a 38 year old female with a history of diabetes, asthma, hypertension and prior back issues who is presenting today with back pain that has been persistent over the last 2 to 3 weeks.  It intermittently will shoot down her right leg.  She denies any weakness of the leg, urinary incontinence or retention or bowel incontinence.  She reports similar symptoms last year due to an injury at work that she completed physical therapy for in February and had been doing okay.  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  She denies any known trauma.  No fever or pelvic pain.  She will take gabapentin  at night but reports the pain is not improving.  The history is provided by the patient.  Back Pain      Prior to Admission medications   Medication Sig Start Date End Date Taking? Authorizing Provider  Accu-Chek Softclix Lancets lancets Use to check blood sugar 3 times a day 08/26/23   Jorge Newcomer, MD  albuterol  (VENTOLIN  HFA) 108 (90 Base) MCG/ACT inhaler INHALE 2 PUFFS INTO THE LUNGS EVERY 6 HOURS AS NEEDED FOR WHEEZING OR SHORTNESS OF BREATH 02/12/23   Ngetich, Dinah C, NP  atomoxetine  (STRATTERA ) 40 MG capsule Take 1 capsule (40 mg total) by mouth daily. 12/22/23   Nwoko, Uchenna E, PA  atorvastatin  (LIPITOR) 20 MG tablet Take 1 tablet (20 mg total) by mouth daily. 09/15/22   Ngetich, Dinah C, NP  Blood Glucose Monitoring Suppl (ACCU-CHEK GUIDE) w/Device KIT Use to check blood sugar 3 times a day 08/26/23   Jorge Newcomer, MD  Continuous Blood Gluc Receiver (FREESTYLE LIBRE 3 READER) DEVI 1 Device by Does not apply route in the morning, at noon, in the evening, and at bedtime. Patient taking differently: 1 Device by Does not  apply route See admin instructions. Use as directed, changing every 14 days. 09/15/22   Ngetich, Dinah C, NP  Continuous Glucose Sensor (FREESTYLE LIBRE 3 PLUS SENSOR) MISC Inject 1 sensor into the skin for continuous blood glucose monitoring, change every 15 days. 11/29/23   Motwani, Komal, MD  cyclobenzaprine  (FLEXERIL ) 10 MG tablet Take 1 tablet (10 mg total) by mouth 2 (two) times daily as needed for muscle spasms. 06/02/23   Tegeler, Marine Sia, MD  Dulaglutide  (TRULICITY ) 3 MG/0.5ML SOAJ Inject 3 mg into the skin once a week. 11/29/23   Motwani, Komal, MD  Dulaglutide  (TRULICITY ) 3 MG/0.5ML SOAJ Inject 3 mg into the skin once a week. 11/29/23     escitalopram  (LEXAPRO ) 10 MG tablet Take 1 tablet (10 mg total) by mouth daily. 12/30/23   Nwoko, Uchenna E, PA  escitalopram  (LEXAPRO ) 5 MG tablet Patient to take 1 tablet (5 mg total) daily for 6 days. 12/24/23   Nwoko, Uchenna E, PA  gabapentin  (NEURONTIN ) 300 MG capsule Take 1 capsule (300 mg total) by mouth 3 (three) times daily. 01/07/24   Reina Cara, DPM  Glucagon  (GVOKE HYPOPEN  1-PACK) 1 MG/0.2ML SOAJ Inject 1 mg into the skin as needed (low blood sugar with impaired consciousness). 03/03/23   Motwani, Komal, MD  glucose blood (ACCU-CHEK GUIDE TEST) test strip Use to check blood sugar 3 times a day 08/26/23   Motwani,  Komal, MD  Insulin  Aspart FlexPen (NOVOLOG ) 100 UNIT/ML Inject 15 Units into the skin.    [provider]  Insulin  Glargine (BASAGLAR  KWIKPEN) 100 UNIT/ML Inject 50 Units into the skin daily. 11/23/23 02/21/24  Motwani, Komal, MD  insulin  glargine (LANTUS ) 100 UNIT/ML injection Inject 45 Units into the skin at bedtime.    [provider]  insulin  isophane & regular human KwikPen (HUMULIN  70/30 KWIKPEN) (70-30) 100 UNIT/ML KwikPen Inject 22 Units into the skin See admin instructions. Before meals.    [provider]  insulin  lispro (HUMALOG ) 100 UNIT/ML KwikPen Inject 15 Units into the skin 3 (three) times daily  with meals. 12/29/23 03/28/24  Motwani, Komal, MD  Insulin  Pen Needle (AUM MINI INSULIN  PEN NEEDLE) 32G X 6 MM MISC Use with Lantus  nightly and NPH twice daily 02/16/23     lidocaine  (LIDODERM ) 5 % Place 1 patch onto the skin daily. Remove & Discard patch within 12 hours or as directed by MD 06/02/23   Tegeler, Marine Sia, MD  lidocaine  (LIDODERM ) 5 % Place 1 patch onto the skin daily. Remove & discard patch within 12 hours or as directed by MD. 07/12/23     methocarbamol  (ROBAXIN ) 500 MG tablet Take 1 tablet (500 mg total) by mouth 2 (two) times daily. 06/09/23   Aberman, Caroline C, PA-C  ondansetron  (ZOFRAN -ODT) 4 MG disintegrating tablet Take 1 tablet (4 mg total) by mouth every 8 (eight) hours as needed for nausea or vomiting. 12/28/23   Keith, Kayla N, PA-C  oxyCODONE -acetaminophen  (PERCOCET/ROXICET) 5-325 MG tablet Take 1 tablet by mouth every 4 (four) hours as needed for severe pain (pain score 7-10). 06/02/23   Tegeler, Marine Sia, MD  sertraline  (ZOLOFT ) 100 MG tablet Take 1 tablet (100 mg total) by mouth daily. 11/04/23 11/03/24  Nwoko, Uchenna E, PA  tolnaftate  (TINACTIN) 1 % powder Apply 1 Application topically 2 (two) times daily. 01/07/24   Reina Cara, DPM  valACYclovir  (VALTREX ) 1000 MG tablet Take 1 tablet (1,000 mg total) by mouth daily for 5 days 05/27/23   Lacey Pian, MD  metFORMIN  (GLUCOPHAGE ) 1000 MG tablet Take 1 tablet (1,000 mg total) by mouth 2 (two) times daily with a meal. 09/13/20 10/14/20  Granville Layer, MD    Allergies: Patient has no known allergies.    Review of Systems  Musculoskeletal:  Positive for back pain.    Updated Vital Signs BP 118/79 (BP Location: Right Arm)   Pulse 82   Temp 98.1 F (36.7 C)   Resp 18   Ht 5' 8 (1.727 m)   Wt 113.4 kg   LMP 01/15/2024   SpO2 100%   BMI 38.01 kg/m   Physical Exam Vitals and nursing note reviewed.  Constitutional:      General: She is not in acute distress.    Appearance: She is well-developed.  HENT:      Head: Normocephalic and atraumatic.   Eyes:     Pupils: Pupils are equal, round, and reactive to light.    Cardiovascular:     Rate and Rhythm: Normal rate.     Pulses: Normal pulses.  Pulmonary:     Effort: Pulmonary effort is normal.  Abdominal:     General: Bowel sounds are normal. There is no distension.     Palpations: Abdomen is soft.     Tenderness: There is no abdominal tenderness. There is no guarding or rebound.   Musculoskeletal:        General: No tenderness.  Normal range of motion.       Back:     Right lower leg: No edema.     Left lower leg: No edema.     Comments: No edema   Skin:    General: Skin is warm and dry.     Findings: No rash.   Neurological:     Mental Status: She is alert and oriented to person, place, and time. Mental status is at baseline.     Cranial Nerves: No cranial nerve deficit.     Sensory: No sensory deficit.     Motor: No weakness.   Psychiatric:        Behavior: Behavior normal.     (all labs ordered are listed, but only abnormal results are displayed) Labs Reviewed  COMPREHENSIVE METABOLIC PANEL WITH GFR - Abnormal; Notable for the following components:      Result Value   Glucose, Bld 134 (*)    AST 13 (*)    All other components within normal limits  CBC - Abnormal; Notable for the following components:   Hemoglobin 9.2 (*)    HCT 30.3 (*)    MCV 69.5 (*)    MCH 21.1 (*)    RDW 16.4 (*)    All other components within normal limits  LIPASE, BLOOD  HCG, SERUM, QUALITATIVE  URINALYSIS, ROUTINE W REFLEX MICROSCOPIC    EKG: None  Radiology: No results found.   Procedures   Medications Ordered in the ED - No data to display                                  Medical Decision Making Amount and/or Complexity of Data Reviewed Labs: ordered. Decision-making details documented in ED Course.   Pt with gradual onset of back pain suggestive of radiculopathy.  No neurovascular compromise and no incontinence.  Pt  has no infectious sx, hx of CA  or other red flags concerning for pathologic back pain.  Pt is able to ambulate but is painful.  Normal strength and reflexes on exam.  Denies trauma. Will give pt pain control and to return for developement of above sx. I independently interpreted patient's labs and CBC, CMP without acute findings.  hCG is negative.  No exam or history findings concerning for pyelonephritis or UTI.  Will treat supportively and have patient follow-up with PCP as she may need recurrent physical therapy.  She has had an MRI in the past that showed a bulging disc.  No indication for recurrent MRI today as there is no evidence of cauda equina.      Final diagnoses:  Lumbar radiculopathy, acute    ED Discharge Orders     None          Almond Army, MD 01/15/24 [phone]

## 2024-01-15 NOTE — ED Triage Notes (Signed)
 Pt c/o of lower back pain that radiates to her legs for a few weeks. Graduated from physical therapy in January. 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

## 2024-01-24 ENCOUNTER — Other Ambulatory Visit (HOSPITAL_COMMUNITY): Payer: Self-pay

## 2024-01-24 ENCOUNTER — Ambulatory Visit (INDEPENDENT_AMBULATORY_CARE_PROVIDER_SITE_OTHER): Payer: MEDICAID | Admitting: Family Medicine

## 2024-01-24 ENCOUNTER — Encounter (HOSPITAL_COMMUNITY): Payer: Self-pay

## 2024-01-24 ENCOUNTER — Encounter: Payer: Self-pay | Admitting: Family Medicine

## 2024-01-24 VITALS — BP 108/68 | HR 65 | Temp 98.1°F | Ht 68.0 in | Wt 255.0 lb

## 2024-01-24 DIAGNOSIS — F419 Anxiety disorder, unspecified: Secondary | ICD-10-CM | POA: Diagnosis not present

## 2024-01-24 DIAGNOSIS — D5740 Sickle-cell thalassemia without crisis: Secondary | ICD-10-CM | POA: Diagnosis not present

## 2024-01-24 DIAGNOSIS — I10 Essential (primary) hypertension: Secondary | ICD-10-CM | POA: Diagnosis not present

## 2024-01-24 DIAGNOSIS — G8929 Other chronic pain: Secondary | ICD-10-CM

## 2024-01-24 DIAGNOSIS — H903 Sensorineural hearing loss, bilateral: Secondary | ICD-10-CM

## 2024-01-24 DIAGNOSIS — Z7689 Persons encountering health services in other specified circumstances: Secondary | ICD-10-CM

## 2024-01-24 DIAGNOSIS — M5442 Lumbago with sciatica, left side: Secondary | ICD-10-CM | POA: Diagnosis not present

## 2024-01-24 DIAGNOSIS — E118 Type 2 diabetes mellitus with unspecified complications: Secondary | ICD-10-CM

## 2024-01-24 DIAGNOSIS — M5441 Lumbago with sciatica, right side: Secondary | ICD-10-CM

## 2024-01-24 DIAGNOSIS — F331 Major depressive disorder, recurrent, moderate: Secondary | ICD-10-CM

## 2024-01-24 DIAGNOSIS — Z6841 Body Mass Index (BMI) 40.0 and over, adult: Secondary | ICD-10-CM | POA: Diagnosis not present

## 2024-01-24 DIAGNOSIS — J4520 Mild intermittent asthma, uncomplicated: Secondary | ICD-10-CM | POA: Diagnosis not present

## 2024-01-24 MED ORDER — BUDESONIDE-FORMOTEROL FUMARATE 160-4.5 MCG/ACT IN AERO
2.0000 | INHALATION_SPRAY | Freq: Two times a day (BID) | RESPIRATORY_TRACT | 3 refills | Status: DC
  Filled 2024-01-24 – 2024-02-24 (×2): qty 10.2, 30d supply, fill #0

## 2024-01-24 MED ORDER — ALBUTEROL SULFATE HFA 108 (90 BASE) MCG/ACT IN AERS
2.0000 | INHALATION_SPRAY | Freq: Four times a day (QID) | RESPIRATORY_TRACT | 5 refills | Status: AC | PRN
  Filled 2024-01-24 – 2024-02-24 (×2): qty 18, 25d supply, fill #0
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
## 2024-01-24 NOTE — Patient Instructions (Signed)
  VISIT SUMMARY: Today, you came in to establish care and discuss your ongoing health concerns. We reviewed your history of diabetes, mood disorder, asthma, neuropathy, chronic lower back pain, and sensorineural hearing loss. You are currently managed by endocrinology for diabetes and psychiatry for mood disorder, and you have been referred to podiatry for neuropathy. We discussed your asthma symptoms, chronic lower back pain, and hearing loss, and provided referrals and treatment plans to address these issues.  YOUR PLAN: -ASTHMA: Asthma is a condition where your airways narrow and swell, making it difficult to breathe. Your symptoms are exacerbated by heat and environmental conditions, especially at work. We are prescribing an as-needed inhaler with a steroid and beta agonist, such as Symbicort, to reduce your reliance on albuterol . Use albuterol  as a backup for severe symptoms.  -CHRONIC LOWER BACK PAIN: Chronic lower back pain is persistent pain in the lower back area. We are providing a referral to physical therapy at [facility] on [address] and a referral to orthopedics in [place] for further evaluation and management.  -SENSORINEURAL HEARING LOSS: Sensorineural hearing loss is a type of hearing loss resulting from damage to the inner ear or the nerve pathways from the inner ear to the brain. We are providing a referral to an ear, nose, and throat specialist with integrated audiology services for a comprehensive evaluation and management.  -DIABETES MELLITUS: Diabetes mellitus is a condition where your blood sugar levels are too high. Your diabetes management is primarily under the care of endocrinology.  -NEUROPATHY: Neuropathy is a condition where you have nerve damage, often causing pain or numbness. Your neuropathy is managed by podiatry.  -GENERAL HEALTH MAINTENANCE: We discussed general health maintenance, including the importance of routine lab work. We recommend fasting lab  work if it has not been done recently.  INSTRUCTIONS: Please schedule a follow-up appointment in approximately one month or sooner if your symptoms worsen. Ensure you are established with other clinics and specialists as needed. We have provided referrals for physical therapy, orthopedics, and an ear, nose, and throat specialist with integrated audiology services.

## 2024-02-01 ENCOUNTER — Ambulatory Visit: Admitting: "Endocrinology

## 2024-02-02 ENCOUNTER — Encounter (HOSPITAL_COMMUNITY): Payer: Self-pay

## 2024-02-02 ENCOUNTER — Telehealth (HOSPITAL_COMMUNITY): Admitting: Physician Assistant

## 2024-02-03 ENCOUNTER — Other Ambulatory Visit (HOSPITAL_COMMUNITY): Payer: Self-pay

## 2024-02-09 ENCOUNTER — Emergency Department (HOSPITAL_COMMUNITY)
Admission: EM | Admit: 2024-02-09 | Discharge: 2024-02-09 | Disposition: A | Discharge status: Home / Self Care | Payer: MEDICAID | Attending: Emergency Medicine | Admitting: Emergency Medicine

## 2024-02-09 ENCOUNTER — Emergency Department (HOSPITAL_COMMUNITY): Payer: MEDICAID

## 2024-02-09 ENCOUNTER — Other Ambulatory Visit: Payer: Self-pay

## 2024-02-09 ENCOUNTER — Encounter (HOSPITAL_COMMUNITY): Payer: Self-pay | Admitting: *Deleted

## 2024-02-09 DIAGNOSIS — Z7984 Long term (current) use of oral hypoglycemic drugs: Secondary | ICD-10-CM | POA: Diagnosis not present

## 2024-02-09 DIAGNOSIS — E119 Type 2 diabetes mellitus without complications: Secondary | ICD-10-CM | POA: Diagnosis not present

## 2024-02-09 DIAGNOSIS — Z794 Long term (current) use of insulin: Secondary | ICD-10-CM | POA: Insufficient documentation

## 2024-02-09 DIAGNOSIS — Z79899 Other long term (current) drug therapy: Secondary | ICD-10-CM | POA: Insufficient documentation

## 2024-02-09 DIAGNOSIS — J45909 Unspecified asthma, uncomplicated: Secondary | ICD-10-CM | POA: Diagnosis not present

## 2024-02-09 DIAGNOSIS — I10 Essential (primary) hypertension: Secondary | ICD-10-CM | POA: Diagnosis not present

## 2024-02-09 DIAGNOSIS — M79644 Pain in right finger(s): Secondary | ICD-10-CM | POA: Insufficient documentation

## 2024-02-09 DIAGNOSIS — Z7951 Long term (current) use of inhaled steroids: Secondary | ICD-10-CM | POA: Diagnosis not present

## 2024-02-09 DIAGNOSIS — Y990 Civilian activity done for income or pay: Secondary | ICD-10-CM | POA: Diagnosis not present

## 2024-02-09 DIAGNOSIS — X501XXA Overexertion from prolonged static or awkward postures, initial encounter: Secondary | ICD-10-CM | POA: Diagnosis not present

## 2024-02-09 LAB — CBG MONITORING, ED: Glucose-Capillary: 224 mg/dL — ABNORMAL HIGH (ref 70–99)

## 2024-02-09 MED ORDER — DICLOFENAC SODIUM 1 % EX GEL
2.0000 g | Freq: Four times a day (QID) | CUTANEOUS | 0 refills | Status: AC
  Filled 2024-02-09: qty 100, 13d supply, fill #0

## 2024-02-09 NOTE — Discharge Instructions (Addendum)
 You are seen today for right finger pain.  Your imaging today and physical exam today were reassuring that low suspicion for any emergent cause of your symptoms today.  Recommend continue to use heat, ice, rest and buddy tape to help relieve pain as well as using Tylenol  and/or Voltaren  gel as needed.  Follow-up with PCP for any persistent symptoms return to the ED for any new or worsening symptoms.  Take Tylenol  (acetominophen)  650mg  every 4-6 hours, as needed for pain or fever. Do not take more than 4,000 mg in a 24-hour period. As this may cause liver damage. While this is rare, if you begin to develop yellowing of the skin or eyes, stop taking and return to ER immediately.

## 2024-02-09 NOTE — ED Provider Triage Note (Signed)
 Emergency Medicine Provider Triage Evaluation Note  [name] , a 38 y.o. female  was evaluated in triage.  Pt complains of right hand pain. Reports hand has been hurting x 1 week.  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  She states that she was twisting the catheter for a patient and felt pain at the base of her right index finger.  Endorses pain since then.  Review of Systems  Positive:  Negative:   Physical Exam  BP 112/74 (BP Location: Right Arm)   Pulse (!) 104   Temp 97.9 F (36.6 C)   Resp 16   Ht 5' 8 (1.727 m)   Wt 115.7 kg   LMP 01/17/2024   SpO2 100%   BMI 38.78 kg/m  Gen:   Awake, no distress   Resp:  Normal effort  MSK:   Moves extremities without difficulty  Other:  TTP noted to the right 2nd finger at the MCP joint. ROM intact.  Good capillary refill.  Medical Decision Making  Medically screening exam initiated at 6:35 PM.  Appropriate orders placed.  [name] was informed that the remainder of the evaluation will be completed by another provider, this initial triage assessment does not replace that evaluation, and the importance of remaining in the ED until their evaluation is complete.     Nora Lauraine LABOR, PA-C 02/09/24 1836

## 2024-02-09 NOTE — ED Provider Notes (Signed)
 [name]   CSN: [id number] Arrival date & time: 02/09/24  1801     Patient presents with: Hand Pain   [name] is a 38 y.o. female.    Hand Pain  Patient is a 38 year old female to the ED today with concerns for right index finger pain after she twisted it while trying to undo cords at work.  Has had buddy tape over it with relief.  Denies any swelling, weakness, numbness, tingling.  Has full range of motion however is painful throughout the course of the range of motion.       Prior to Admission medications   Medication Sig Start Date End Date Taking? Authorizing Provider  diclofenac  Sodium (VOLTAREN ) 1 % GEL Apply 2 g topically 4 (four) times daily. 02/09/24  Yes Beola Terrall GORMAN, PA-C  Accu-Chek Softclix Lancets lancets Use to check blood sugar 3 times a day 08/26/23   Dartha Ernst, MD  albuterol  (VENTOLIN  HFA) 108 (90 Base) MCG/ACT inhaler Inhale 2 puffs into the lungs every 6 (six) hours as needed for wheezing or shortness of breath. 01/24/24   Sebastian Beverley NOVAK, MD  atomoxetine  (STRATTERA ) 40 MG capsule Take 1 capsule (40 mg total) by mouth daily. 12/22/23   Nwoko, Uchenna E, PA  atorvastatin  (LIPITOR) 20 MG tablet Take 1 tablet (20 mg total) by mouth daily. Patient not taking: Reported on 01/24/2024 09/15/22   Ngetich, Dinah C, NP  Blood Glucose Monitoring Suppl (ACCU-CHEK GUIDE) w/Device KIT Use to check blood sugar 3 times a day 08/26/23   Motwani, Komal, MD  budesonide -formoterol  (SYMBICORT ) 160-4.5 MCG/ACT inhaler Inhale 2 puffs into the lungs every 2 (two) hours as needed (wheezing, cough, or shortness of breath). 01/24/24   Sebastian Beverley NOVAK, MD  Continuous Blood Gluc Receiver (FREESTYLE LIBRE 3 READER) DEVI 1 Device by Does not apply route in the morning, at noon, in the evening, and at bedtime. Patient taking differently: 1 Device by Does not apply route See admin instructions. Use as directed, changing  every 14 days. 09/15/22   Ngetich, Dinah C, NP  Continuous Glucose Sensor (FREESTYLE LIBRE 3 PLUS SENSOR) MISC Inject 1 sensor into the skin for continuous blood glucose monitoring, change every 15 days. 11/29/23   Motwani, Komal, MD  cyclobenzaprine  (FLEXERIL ) 10 MG tablet Take 1 tablet (10 mg total) by mouth 2 (two) times daily as needed for muscle spasms. 01/15/24   Doretha Folks, MD  Dulaglutide  (TRULICITY ) 3 MG/0.5ML SOAJ Inject 3 mg into the skin once a week. 11/29/23   Motwani, Komal, MD  Dulaglutide  (TRULICITY ) 3 MG/0.5ML SOAJ Inject 3 mg into the skin once a week. 11/29/23     escitalopram  (LEXAPRO ) 10 MG tablet Take 1 tablet (10 mg total) by mouth daily. 12/30/23   Nwoko, Uchenna E, PA  escitalopram  (LEXAPRO ) 5 MG tablet Patient to take 1 tablet (5 mg total) daily for 6 days. 12/24/23   Nwoko, Uchenna E, PA  gabapentin  (NEURONTIN ) 300 MG capsule Take 1 capsule (300 mg total) by mouth 3 (three) times daily. 01/07/24   Lamount Ethan CROME, DPM  Glucagon  (GVOKE HYPOPEN  1-PACK) 1 MG/0.2ML SOAJ Inject 1 mg into the skin as needed (low blood sugar with impaired consciousness). Patient not taking: Reported on 01/24/2024 03/03/23   Motwani, Komal, MD  glucose blood (ACCU-CHEK GUIDE TEST) test strip Use to check blood sugar 3 times a day 08/26/23   Motwani, Komal, MD  Insulin  Aspart FlexPen (NOVOLOG ) 100 UNIT/ML  Inject 15 Units into the skin. 22-24 units    [provider]  Insulin  Glargine (BASAGLAR  KWIKPEN) 100 UNIT/ML Inject 50 Units into the skin daily. 11/23/23 02/21/24  Motwani, Komal, MD  insulin  glargine (LANTUS ) 100 UNIT/ML injection Inject 45 Units into the skin at bedtime.    [provider]  insulin  isophane & regular human KwikPen (HUMULIN  70/30 KWIKPEN) (70-30) 100 UNIT/ML KwikPen Inject 22 Units into the skin See admin instructions. Before meals.    [provider]  insulin  lispro (HUMALOG ) 100 UNIT/ML KwikPen Inject 15 Units into the skin 3 (three) times daily with meals.  12/29/23 03/28/24  Motwani, Komal, MD  Insulin  Pen Needle (AUM MINI INSULIN  PEN NEEDLE) 32G X 6 MM MISC Use with Lantus  nightly and NPH twice daily 02/16/23     lidocaine  (LIDODERM ) 5 % Place 1 patch onto the skin daily. Remove & Discard patch within 12 hours or as directed by MD 06/02/23   Tegeler, Lonni PARAS, MD  lidocaine  (LIDODERM ) 5 % Place 1 patch onto the skin daily. Remove & discard patch within 12 hours or as directed by MD. 07/12/23     methocarbamol  (ROBAXIN ) 500 MG tablet Take 1 tablet (500 mg total) by mouth 2 (two) times daily. Patient not taking: Reported on 01/24/2024 06/09/23   Lorelle Aleck BROCKS, PA-C  naproxen  (NAPROSYN ) 500 MG tablet Take 1 tablet (500 mg total) by mouth 2 (two) times daily. 01/15/24   Doretha Folks, MD  ondansetron  (ZOFRAN -ODT) 4 MG disintegrating tablet Take 1 tablet (4 mg total) by mouth every 8 (eight) hours as needed for nausea or vomiting. 12/28/23   Keith, Kayla N, PA-C  oxyCODONE -acetaminophen  (PERCOCET/ROXICET) 5-325 MG tablet Take 1 tablet by mouth every 4 (four) hours as needed for severe pain (pain score 7-10). 06/02/23   Tegeler, Lonni PARAS, MD  sertraline  (ZOLOFT ) 100 MG tablet Take 1 tablet (100 mg total) by mouth daily. Patient not taking: Reported on 01/24/2024 11/04/23 11/03/24  Nwoko, Uchenna E, PA  tolnaftate  (TINACTIN) 1 % powder Apply 1 Application topically 2 (two) times daily. 01/07/24   Lamount Ethan CROME, DPM  valACYclovir  (VALTREX ) 1000 MG tablet Take 1 tablet (1,000 mg total) by mouth daily for 5 days 05/27/23   Cleatus Moccasin, MD  metFORMIN  (GLUCOPHAGE ) 1000 MG tablet Take 1 tablet (1,000 mg total) by mouth 2 (two) times daily with a meal. 09/13/20 10/14/20  Fredirick Glenys RAMAN, MD    Allergies: Patient has no known allergies.    Review of Systems  Musculoskeletal:  Positive for arthralgias.  All other systems reviewed and are negative.   Updated Vital Signs BP 112/74 (BP Location: Right Arm)   Pulse (!) 104   Temp 97.9 F (36.6 C)   Resp  16   Ht 5' 8 (1.727 m)   Wt 115.7 kg   LMP 01/17/2024   SpO2 100%   BMI 38.78 kg/m   Physical Exam Vitals and nursing [name] reviewed.  Constitutional:      General: She is not in acute distress.    Appearance: Normal appearance. She is not ill-appearing or diaphoretic.  HENT:     Head: Normocephalic and atraumatic.  Eyes:     Extraocular Movements: Extraocular movements intact.     Conjunctiva/sclera: Conjunctivae normal.  Cardiovascular:     Rate and Rhythm: Normal rate and regular rhythm.     Pulses: Normal pulses.     Heart sounds: Normal heart sounds. No murmur heard.    No friction rub. No  gallop.  Pulmonary:     Effort: Pulmonary effort is normal. No respiratory distress.     Breath sounds: Normal breath sounds.  Abdominal:     General: Abdomen is flat.     Palpations: Abdomen is soft.     Tenderness: There is no abdominal tenderness.  Musculoskeletal:        General: Tenderness (Tenderness noted along the index finger over the lateral aspect of the finger, no tenderness to palpation over the MCP joint or hand.) present. No swelling or deformity. Normal range of motion.     Right lower leg: No edema.     Left lower leg: No edema.  Skin:    General: Skin is warm and dry.     Capillary Refill: Capillary refill takes less than 2 seconds.     Findings: No bruising, erythema, lesion or rash.  Neurological:     General: No focal deficit present.     Mental Status: She is alert and oriented to person, place, and time. Mental status is at baseline.     Sensory: No sensory deficit.     Motor: No weakness.     Gait: Gait normal.  Psychiatric:        Mood and Affect: Mood normal.     (all labs ordered are listed, but only abnormal results are displayed) Labs Reviewed  CBG MONITORING, ED - Abnormal; Notable for the following components:      Result Value   Glucose-Capillary 224 (*)    All other components within normal limits    EKG: None  Radiology: DG Hand  Complete Right Result Date: 02/09/2024 CLINICAL DATA:  Right hand pain.  No trauma. EXAM: RIGHT HAND - COMPLETE 3+ VIEW COMPARISON:  None Available. FINDINGS: Right hand appears intact. No evidence of acute fracture or subluxation. No focal bone lesion or bone destruction. Bone cortex and trabecular architecture appear intact. No radiopaque soft tissue foreign bodies. IMPRESSION: No acute bony abnormalities. Electronically Signed   By: Elsie Gravely M.D.   On: 02/09/2024 19:11     Procedures   Medications Ordered in the ED - No data to display                               Medical Decision Making  This patient is a 38 year old female who presents to the ED for concern of right index finger pain after twisting her finger while trying to untie cords at work, notes that this has been painful but gotten some relief with buddy taping, has not tried any medications.  Notes that she does not have any decreased range of motion but does have pain when trying to move the finger.  On physical exam, patient is in no acute distress, afebrile, alert and orient x 4, speaking in full sentences, nontachypneic, nontachycardic.  Notably tender over the lateral aspect of right index finger, normal ROM, normal cap refill, normal sensation, no swelling, no bruising, no injury noted.  Unremarkable exam otherwise.  Low suspicion for any emergent injuries however x-ray was done and was negative.  Will have her continue to buddy tape as well as use over-the-counter pain medication and Voltaren  gel.  Will have her continue to follow-up with PCP for any persistent pain and return to the ED for any new or worsening pain.  Patient vital signs have remained stable throughout the course of patient's time in the ED. Low suspicion for any other emergent  pathology at this time. I believe this patient is safe to be discharged. Provided strict return to ER precautions. Patient expressed agreement and understanding of plan. All  questions were answered.  Differential diagnoses prior to evaluation: The emergent differential diagnosis includes, but is not limited to, fracture, ligamentous injury, neurovascular injury, dislocation, malalignment, carpal syndrome. This is not an exhaustive differential.   Past Medical History / Co-morbidities / Social History: Asthma, fibroids, DKA, diabetes, sickle cell trait, HTN  Additional history: Chart reviewed. Pertinent results include:   Left seen by PCP on 01/24/2024, noted to have a referral pending to orthopedics.  Lab Tests/Imaging studies: I personally interpreted labs/imaging and the pertinent results include:    X-ray of right hand negative.   I agree with the radiologist interpretation.  Medications:   I have reviewed the patients home medicines and have made adjustments as needed.  Critical Interventions: None  Social Determinants of Health: Notably is able to have a good follow-up with PCP.  Disposition: After consideration of the diagnostic results and the patients response to treatment, I feel that the patient would benefit from discharge and treatment as above.   emergency department workup does not suggest an emergent condition requiring admission or immediate intervention beyond what has been performed at this time. The plan is: Follow-up with PCP for any persistent symptoms, return to ED for new or worsened symptoms, continuing to use ice, heat, NSAIDs as needed. The patient is safe for discharge and has been instructed to return immediately for worsening symptoms, change in symptoms or any other concerns.    Final diagnoses:  Finger pain, right    ED Discharge Orders          Ordered    diclofenac  Sodium (VOLTAREN ) 1 % GEL  4 times daily        02/09/24 2141               Beola Terrall RAMAN, [place] 02/09/24 2141    Lenor Hollering, MD 02/10/24 0000

## 2024-02-09 NOTE — ED Triage Notes (Signed)
 Pt here from home with c/o right hand pain , no trauma noted

## 2024-02-10 ENCOUNTER — Other Ambulatory Visit (HOSPITAL_COMMUNITY): Payer: Self-pay

## 2024-02-15 ENCOUNTER — Telehealth: Payer: Self-pay

## 2024-02-15 NOTE — Progress Notes (Signed)
 Complex Care Management Note Care Guide Note  02/15/2024 Name: [name] MRN: [id number] DOB: Mar 07, 1986   Complex Care Management Outreach Attempts: An unsuccessful telephone outreach was attempted today to offer the patient information about available complex care management services.  Follow Up Plan:  Additional outreach attempts will be made to offer the patient complex care management information and services.   Encounter Outcome:  No Answer  Dreama Lynwood [facility]  [facility], [facility] Management Assistant Direct Dial : [phone]  Fax: [phone]

## 2024-02-17 ENCOUNTER — Telehealth (HOSPITAL_COMMUNITY): Admitting: Physician Assistant

## 2024-02-18 ENCOUNTER — Encounter (INDEPENDENT_AMBULATORY_CARE_PROVIDER_SITE_OTHER): Payer: Self-pay

## 2024-02-23 ENCOUNTER — Other Ambulatory Visit (HOSPITAL_COMMUNITY): Payer: Self-pay

## 2024-02-23 NOTE — Progress Notes (Signed)
 Complex Care Management Note Care Guide Note  02/23/2024 Name: [name] MRN: [id number] DOB: 1985/09/15   Complex Care Management Outreach Attempts: A second unsuccessful outreach was attempted today to offer the patient with information about available complex care management services.  Follow Up Plan:  Additional outreach attempts will be made to offer the patient complex care management information and services.   Encounter Outcome:  No Answer  [name]  [facility], [facility] Management Assistant Direct Dial : [phone]  Fax: [phone]

## 2024-02-24 ENCOUNTER — Other Ambulatory Visit (HOSPITAL_COMMUNITY): Payer: Self-pay

## 2024-02-28 ENCOUNTER — Telehealth (HOSPITAL_COMMUNITY): Admitting: Physician Assistant

## 2024-02-28 DIAGNOSIS — F419 Anxiety disorder, unspecified: Secondary | ICD-10-CM | POA: Diagnosis not present

## 2024-02-28 DIAGNOSIS — F331 Major depressive disorder, recurrent, moderate: Secondary | ICD-10-CM | POA: Diagnosis not present

## 2024-02-28 NOTE — Progress Notes (Signed)
 Complex Care Management Note  Care Guide Note 02/28/2024 Name: [name] MRN: [id number] DOB: 04/04/1986  [name] is a 38 y.o. year old female who sees Sebastian Beverley NOVAK, MD for primary care. I reached out to Elease GORMAN Pouch by phone today to offer complex care management services.  [name] was given information about Complex Care Management services today including:   The Complex Care Management services include support from the care team which includes your Nurse Care Manager, Clinical Social Worker, or Pharmacist.  The Complex Care Management team is here to help remove barriers to the health concerns and goals most important to you. Complex Care Management services are voluntary, and the patient may decline or stop services at any time by request to their care team member.   Complex Care Management Consent Status: Patient agreed to services and verbal consent obtained.   Follow up plan:  Telephone appointment with complex care management team member scheduled for:  03/01/24 & 03/06/24.  Encounter Outcome:  Patient Scheduled  Dreama Lynwood [facility]  [facility], [facility] Management Assistant Direct Dial : [phone]  Fax: [phone]

## 2024-03-01 ENCOUNTER — Other Ambulatory Visit: Payer: MEDICAID | Admitting: Licensed Clinical Social Worker

## 2024-03-01 ENCOUNTER — Encounter (HOSPITAL_COMMUNITY): Payer: Self-pay | Admitting: Physician Assistant

## 2024-03-01 MED ORDER — ESCITALOPRAM OXALATE 10 MG PO TABS: 10.0000 mg | ORAL_TABLET | Freq: Every day | ORAL | 1 refills | Status: DC

## 2024-03-01 NOTE — Progress Notes (Signed)
 BH MD/PA/NP OP Progress Note  Virtual Visit via Video Note  I connected with [name] on 02/28/24 at  4:30 PM EDT by a video enabled telemedicine application and verified that I am speaking with the correct person using two identifiers.  Location: Patient: Home Provider: Clinic   I discussed the limitations of evaluation and management by telemedicine and the availability of in person appointments. The patient expressed understanding and agreed to proceed.  Follow Up Instructions:  I discussed the assessment and treatment plan with the patient. The patient was provided an opportunity to ask questions and all were answered. The patient agreed with the plan and demonstrated an understanding of the instructions.   The patient was advised to call back or seek an in-person evaluation if the symptoms worsen or if the condition fails to improve as anticipated.  I provided 26 minutes of non-face-to-face time during this encounter.  Raven FORBES Bolster, PA    02/28/2024 4:30 PM [name]  MRN:  [id number]  Chief Complaint:  No chief complaint on file.  HPI:   [name] is a 38 year old, African-American female with a past psychiatric history significant for major depressive disorder and anxiety who presents to [facility] for follow-up and medication management.  Patient is currently being managed on the following medication:   Escitalopram  10 mg daily Strattera  40 mg daily  Patient presents to the encounter stating that since taking her Strattera , it has caused her to sleep more.  Since using Lexapro , patient reports that her focus and concentration have improved.  She still continues to endorse depressive episodes attributed to financial burden.  Patient rates her depression a 9 out of 10 with 10 being most severe.  Patient endorses depressive episodes 5 days/week.  Patient endorses the following depressive symptoms:  increased appetite, excessive worrying, and lack of motivation.  She reports that she occasionally feels like giving up whenever she feels like she is on track but then gets knocked 20 steps back.  Patient continues to endorse anxiety and rates her anxiety an 8 out of 10.  Patient reports that her anxiety is triggered by financial issues.  Though she endorses financial burden, patient reports that she is trying not to overwork herself too much physically or mentally.  Patient reports that she is unsure of how to proceed with all of her stressors.  A PHQ-9 screen was performed with the patient scoring at 13.  A GAD-7 screen was also performed with the patient scored a 14.  Patient is alert and oriented x 4, calm, cooperative, and fully engaged in conversation during the encounter.  Patient endorses okay mood.  Patient exhibits depressed mood with congruent affect.  Patient denies suicidal or homicidal ideations.  She further denies auditory or visual hallucinations and does not appear to be responding to internal/external stimuli.  Patient endorses fair sleep and receives on average 5-1/2 to 6 hours of sleep per night.  Patient endorses fair appetite and eats on average 2 meals per day.  Patient denies alcohol consumption, tobacco use, or illicit drug use.  Visit Diagnosis:    ICD-10-CM   1. Moderate episode of recurrent major depressive disorder (HCC)  F33.1 escitalopram  (LEXAPRO ) 10 MG tablet    2. Anxiety disorder, unspecified type  F41.9 escitalopram  (LEXAPRO ) 10 MG tablet     Past Psychiatric History:  Patient endorses a past history depression and anxiety   Past Medical History:  Past Medical History:  Diagnosis  Date   Asthma    COVID-19 07/04/2020   05/04/20   Diabetes mellitus without complication (HCC)    DKA (diabetic ketoacidoses) 08/27/2018   Family history of diabetes mellitus in mother 01/10/2018   Fibroid    pedunculated posterior RUQ near fundus. approx 3-4cm at time of 01/2016  c-section   GBS (group B streptococcus) infection    Hearing loss    pt states she was tested in high school and told it was selective she says she continues to have a deficit and does not feel it is selective   Herpes    Herpes simplex vulvovaginitis 09/06/2020   Hypertension    Newly diagnosed diabetes (HCC) 01/10/2018   Diagnosed 01/10/18  A1c 7.5%   Sickle cell trait (HCC) 09/01/2014   [ ]  ucx q trimester   Trichomonas vaginitis     Past Surgical History:  Procedure Laterality Date   CESAREAN SECTION N/A 01/08/2016   Procedure: CESAREAN SECTION;  Surgeon: Bebe Furry, MD;  Location: [facility] BIRTHING SUITES;  Service: Obstetrics;  Laterality: N/A;   CESAREAN SECTION N/A 09/10/2020   Procedure: CESAREAN SECTION;  Surgeon: Barbra Lang PARAS, DO;  Location: [facility] LD ORS;  Service: Obstetrics;  Laterality: N/A;   DILATION AND CURETTAGE OF UTERUS     TUBAL LIGATION  09/10/2020   Procedure: BILATERAL TUBAL LIGATION;  Surgeon: Barbra Lang PARAS, DO;  Location: [facility] LD ORS;  Service: Obstetrics;;   WISDOM TOOTH EXTRACTION      Family Psychiatric History:  Mother - patient reports that her mother states that she has everything in the book in regards to her mental health Sister - history of suicide and depression Brother (deceased) - depression   Family history of suicide attempt: Patient reports that her mother and sister have attempted suicide Family history of homicide attempt: Patient denies a family history of homicide attempts Family history of substance abuse: Patient reports that her mother abused drugs in the past  Family History:  Family History  Problem Relation Age of Onset   Diabetes Mother    Hypertension Mother    Hyperlipidemia Mother    Heart disease Mother    Breast cancer Mother        54   Ovarian cancer Mother        Unknown age   Diabetes Sister    Diabetes Brother    Breast cancer Cousin    Heart disease Maternal Grandmother     Social History:  Social History    Socioeconomic History   Marital status: Married    Spouse name: Not on file   Number of children: Not on file   Years of education: Not on file   Highest education level: Not on file  Occupational History   Not on file  Tobacco Use   Smoking status: Some Days    Types: Cigarettes, E-cigarettes   Smokeless tobacco: Never   Tobacco comments:    quit 2013 cigarettes, I vape every once in a blue moon.  Vaping Use   Vaping status: Never Used  Substance and Sexual Activity   Alcohol use: No   Drug use: No   Sexual activity: Yes    Birth control/protection: None  Other Topics Concern   Not on file  Social History Narrative   Not on file   Social Drivers of Health   Financial Resource Strain: Not on File (01/25/2023)   Received from [employer]    Financial Resource Strain: 0  Food Insecurity: At Risk (02/16/2023)   Received from [facility]   Food Insecurity    Within the past 12 months, the food you bought just didn't last and you didn't have enough money to get more.: 2  Transportation Needs: At Risk (02/16/2023)   Received from [facility] Needs    In the past 12 months, has lack of transportation kept you from medical appointments, meetings, work or from getting things needed for daily living? (Check all that apply): 2  Physical Activity: Not on File (01/25/2023)   Received from [facility]   Physical Activity    Physical Activity: 0  Stress: Not on File (01/25/2023)   Received from [facility]   Stress    Stress: 0  Social Connections: Not on File (04/08/2023)   Received from [facility]   Social Connections    Connectedness: 0    Allergies: No Known Allergies  Metabolic Disorder Labs: Lab Results  Component Value Date   HGBA1C 13.0 (A) 11/29/2023   MPG  09/25/2022     Comment:     eAG cannot be calculated. Hemoglobin A1c result exceeds the linearity of the assay. . HbA1c performed on Roche platform. Effective 05/11/22 a change in test platforms  may have  shifted HbA1c results compared to historical results.    MPG 162.81 09/10/2020   No results found for: PROLACTIN Lab Results  Component Value Date   CHOL 185 09/25/2022   TRIG 82 09/25/2022   HDL 53 09/25/2022   CHOLHDL 3.5 09/25/2022   LDLCALC 114 (H) 09/25/2022   LDLCALC 138 (H) 12/05/2020   Lab Results  Component Value Date   TSH 1.13 09/25/2022   TSH 1.350 11/28/2020    Therapeutic Level Labs: No results found for: LITHIUM No results found for: VALPROATE No results found for: CBMZ  Current Medications: Current Outpatient Medications  Medication Sig Dispense Refill   Accu-Chek Softclix Lancets lancets Use to check blood sugar 3 times a day 300 each 5   albuterol  (VENTOLIN  HFA) 108 (90 Base) MCG/ACT inhaler Inhale 2 puffs into the lungs every 6 (six) hours as needed for wheezing or shortness of breath. 18 g 5   atomoxetine  (STRATTERA ) 40 MG capsule Take 1 capsule (40 mg total) by mouth daily. 30 capsule 1   atorvastatin  (LIPITOR) 20 MG tablet Take 1 tablet (20 mg total) by mouth daily. (Patient not taking: Reported on 01/24/2024) 90 tablet 1   Blood Glucose Monitoring Suppl (ACCU-CHEK GUIDE) w/Device KIT Use to check blood sugar 3 times a day 1 kit 0   budesonide -formoterol  (SYMBICORT ) 160-4.5 MCG/ACT inhaler Inhale 2 puffs into the lungs 2 (two) times daily. 10.2 g 3   Continuous Blood Gluc Receiver (FREESTYLE LIBRE 3 READER) DEVI 1 Device by Does not apply route in the morning, at noon, in the evening, and at bedtime. (Patient taking differently: 1 Device by Does not apply route See admin instructions. Use as directed, changing every 14 days.) 1 each 5   Continuous Glucose Sensor (FREESTYLE LIBRE 3 PLUS SENSOR) MISC Inject 1 sensor into the skin for continuous blood glucose monitoring, change every 15 days. 6 each 3   cyclobenzaprine  (FLEXERIL ) 10 MG tablet Take 1 tablet (10 mg total) by mouth 2 (two) times daily as needed for muscle spasms. 20 tablet 1    diclofenac  Sodium (VOLTAREN ) 1 % GEL Apply 2 g topically 4 (four) times daily. 100 g 0   Dulaglutide  (TRULICITY ) 3 MG/0.5ML SOAJ Inject 3 mg into the skin once  a week. 2 mL 6   Dulaglutide  (TRULICITY ) 3 MG/0.5ML SOAJ Inject 3 mg into the skin once a week. 2 mL 0   escitalopram  (LEXAPRO ) 10 MG tablet Take 1 tablet (10 mg total) by mouth daily. 30 tablet 1   gabapentin  (NEURONTIN ) 300 MG capsule Take 1 capsule (300 mg total) by mouth 3 (three) times daily. 90 capsule 3   Glucagon  (GVOKE HYPOPEN  1-PACK) 1 MG/0.2ML SOAJ Inject 1 mg into the skin as needed (low blood sugar with impaired consciousness). (Patient not taking: Reported on 01/24/2024) 0.4 mL 2   glucose blood (ACCU-CHEK GUIDE TEST) test strip Use to check blood sugar 3 times a day 300 each 5   Insulin  Aspart FlexPen (NOVOLOG ) 100 UNIT/ML Inject 15 Units into the skin. 22-24 units     Insulin  Glargine (BASAGLAR  KWIKPEN) 100 UNIT/ML Inject 50 Units into the skin daily. 45 mL 3   insulin  glargine (LANTUS ) 100 UNIT/ML injection Inject 45 Units into the skin at bedtime.     insulin  isophane & regular human KwikPen (HUMULIN  70/30 KWIKPEN) (70-30) 100 UNIT/ML KwikPen Inject 22 Units into the skin See admin instructions. Before meals.     insulin  lispro (HUMALOG ) 100 UNIT/ML KwikPen Inject 15 Units into the skin 3 (three) times daily with meals. 45 mL 1   Insulin  Pen Needle (AUM MINI INSULIN  PEN NEEDLE) 32G X 6 MM MISC Use with Lantus  nightly and NPH twice daily 100 each 11   lidocaine  (LIDODERM ) 5 % Place 1 patch onto the skin daily. Remove & Discard patch within 12 hours or as directed by MD 15 patch 0   lidocaine  (LIDODERM ) 5 % Place 1 patch onto the skin daily. Remove & discard patch within 12 hours or as directed by MD. 30 patch 2   methocarbamol  (ROBAXIN ) 500 MG tablet Take 1 tablet (500 mg total) by mouth 2 (two) times daily. (Patient not taking: Reported on 01/24/2024) 20 tablet 0   naproxen  (NAPROSYN ) 500 MG tablet Take 1 tablet (500 mg total)  by mouth 2 (two) times daily. 20 tablet 1   ondansetron  (ZOFRAN -ODT) 4 MG disintegrating tablet Take 1 tablet (4 mg total) by mouth every 8 (eight) hours as needed for nausea or vomiting. 20 tablet 0   oxyCODONE -acetaminophen  (PERCOCET/ROXICET) 5-325 MG tablet Take 1 tablet by mouth every 4 (four) hours as needed for severe pain (pain score 7-10). 15 tablet 0   tolnaftate  (TINACTIN) 1 % powder Apply 1 Application topically 2 (two) times daily. 45 g 0   valACYclovir  (VALTREX ) 1000 MG tablet Take 1 tablet (1,000 mg total) by mouth daily for 5 days 5 tablet 2   No current facility-administered medications for this visit.     Musculoskeletal: Strength & Muscle Tone: within normal limits Gait & Station: normal Patient leans: N/A  Psychiatric Specialty Exam: Review of Systems  Psychiatric/Behavioral:  Positive for decreased concentration, dysphoric mood and sleep disturbance. Negative for hallucinations, self-injury and suicidal ideas. The patient is nervous/anxious. The patient is not hyperactive.     There were no vitals taken for this visit.There is no height or weight on file to calculate BMI.  General Appearance: Casual  Eye Contact:  Good  Speech:  Clear and Coherent and Normal Rate  Volume:  Normal  Mood:  Anxious and Depressed  Affect:  Congruent  Thought Process:  Coherent and Descriptions of Associations: Intact  Orientation:  Full (Time, Place, and Person)  Thought Content: WDL   Suicidal Thoughts:  No  Homicidal Thoughts:  No  Memory:  Immediate;   Good Recent;   Good Remote;   Good  Judgement:  Good  Insight:  Good  Psychomotor Activity:  Normal  Concentration:  Concentration: Good and Attention Span: Good  Recall:  Good  Fund of Knowledge: Good  Language: Good  Akathisia:  No  Handed:  Right  AIMS (if indicated): not done  Assets:  Communication Skills Desire for Improvement Social Support  ADL's:  Intact  Cognition: WNL  Sleep:  Fair   Screenings: GAD-7     Flowsheet Row Video Visit from 02/28/2024 in [facility] Office Visit from 01/24/2024 in [facility] Video Visit from 12/22/2023 in [facility] Clinical Support from 11/04/2023 in [facility] Video Visit from 08/06/2023 in [facility]  Total GAD-7 Score 14 10 18 19 7    PHQ2-9    Flowsheet Row Video Visit from 02/28/2024 in [facility] Office Visit from 01/24/2024 in [facility] Video Visit from 12/22/2023 in [facility] Clinical Support from 11/04/2023 in [facility] Video Visit from 08/06/2023 in [place]  PHQ-2 Total Score 4 2 5 3 1   PHQ-9 Total Score 13 13 17 11  --   Flowsheet Row Video Visit from 02/28/2024 in [facility] ED from 02/09/2024 in [facility] Emergency Department at [facility] ED from 01/15/2024 in [facility] Emergency Department at [facility]  C-SSRS RISK CATEGORY No Risk No Risk No Risk     Assessment and Plan:   Taiya Nutting. [name] is a 38 year old, African-American female with a past psychiatric history significant for major depressive disorder and anxiety who presents to [facility] via virtual video visit for follow-up and medication management.  Patient presents to the encounter stating that her use of Strattera  caused her to sleep more.  Patient would like to discontinue her use of Strattera  following the conclusion of the encounter.  Since taking escitalopram , patient reports that her focus and concentration have improved.  She still continues to endorse depressive symptoms and anxiety attributed to stressors such as financial burden.  A PHQ-9 screen was performed with the patient scoring  a 13.  A GAD-7 screen was also performed with the patient scoring a 14. Despite her ongoing depression and anxiety, patient would like to continue taking her escitalopram  as prescribed.  Patient's medication to be e-prescribed to pharmacy of choice.  A [place] Suicide Severity Rating Scale was performed with the patient being considered no risk.  Patient denies suicidal ideations and is able to contract for safety following the conclusion of the encounter.  Collaboration of Care: Collaboration of Care: Medication Management AEB provider managing patient's psychiatric medications, Psychiatrist AEB patient being seen by mental health provider at this facility, and Referral or follow-up with counselor/therapist AEB patient being seen by a licensed clinical social worker at this facility  Patient/Guardian was advised Release of Information must be obtained prior to any record release in order to collaborate their care with an outside provider. Patient/Guardian was advised if they have not already done so to contact the registration department to sign all necessary forms in order for us  to release information regarding their care.   Consent: Patient/Guardian gives verbal consent for treatment and assignment of benefits for services provided during this visit. Patient/Guardian expressed  understanding and agreed to proceed.   1. Moderate episode of recurrent major depressive disorder (HCC)  - escitalopram  (LEXAPRO ) 10 MG tablet; Take 1 tablet (10 mg total) by mouth daily.  Dispense: 30 tablet; Refill: 1  2. Anxiety disorder, unspecified type  - escitalopram  (LEXAPRO ) 10 MG tablet; Take 1 tablet (10 mg total) by mouth daily.  Dispense: 30 tablet; Refill: 1  Patient to follow-up in 6 weeks Provider spent a total of 23 minutes with the patient/reviewing patient's chart  Raven FORBES Bolster, PA 02/28/2024, 4:30 PM

## 2024-03-01 NOTE — Patient Outreach (Signed)
 Complex Care Management   Visit Note  03/01/2024  Name:  [name] MRN: [id number] DOB: [birth year]  Situation: Referral received for Complex Care Management related to SDOH Barriers:  Housing and finances Food insecurity Financial Resource Strain I obtained verbal consent from Patient.  Visit completed with patient  on the phone  Background:   Past Medical History:  Diagnosis Date   Asthma    COVID-19 07/04/2020   05/04/20   Diabetes mellitus without complication (HCC)    DKA (diabetic ketoacidoses) 08/27/2018   Family history of diabetes mellitus in mother 01/10/2018   Fibroid    pedunculated posterior RUQ near fundus. approx 3-4cm at time of 01/2016 c-section   GBS (group B streptococcus) infection    Hearing loss    pt states she was tested in high school and told it was selective she says she continues to have a deficit and does not feel it is selective   Herpes    Herpes simplex vulvovaginitis 09/06/2020   Hypertension    Newly diagnosed diabetes (HCC) 01/10/2018   Diagnosed 01/10/18  A1c 7.5%   Sickle cell trait (HCC) 09/01/2014   [ ]  ucx q trimester   Trichomonas vaginitis     Assessment: Patient just needed resources for food, utilities and housing and the SW emailed that to her. Patient stated that she is still married but separated and that she and her spouse still co parent and help one another out. Patient is already receiving MH/BH services through [facility] that [facility] to be [facility], has an upcoming appointment in November.    SDOH Interventions    Flowsheet Row Patient Outreach Telephone from 03/01/2024 in [facility] Office Visit from [facility] Clinical Support from 09/18/2020 in [facility] for [facility] at [facility] Routine Prenatal from 08/15/2020 in Center for [facility] at [facility] Clinical Support  from 06/26/2020 in Center for [employer] [facility] Routine Prenatal from 04/03/2020 in Center for [employer] [facility]  SDOH Interventions        Food Insecurity Interventions Community Resources Provided  [SW will email the food pantry list and patient uses the [employer] app] Architect ([facility] only) Stage manager ([facility] only) Stage manager ([facility] only) Stage manager ([facility] only) --  Housing Interventions [employer] Provided  [SW will send resources] -- -- -- -- --  Transportation Interventions -- Other (Comment) -- -- -- --  Utilities Interventions Community Resources Provided  [resources will be emailed] -- -- -- -- --  Depression Interventions/Treatment  -- -- -- -- -- Patient refuses Treatment  Financial Strain Interventions Community Resources Provided  [resources memailed] -- -- -- -- --      Recommendation:   none  Follow Up Plan:   Closing From:  Complex Care Management  [name] Raven HEDWIG, PhD [facility], [facility] Social Worker Direct Dial : [phone]  Fax: [phone]

## 2024-03-01 NOTE — Patient Instructions (Signed)
 Visit Information  Thank you for taking time to visit with me today. Please don't hesitate to contact me if I can be of assistance to you before our next scheduled appointment.  Our next appointment is no further scheduled appointments.   Please call the care guide team at [phone] if you need to cancel or reschedule your appointment.   Following is a copy of your care plan:   Goals Addressed             This Visit's Progress    BSW VBCI Social Work Care Plan       Problems:   Corporate treasurer , Geophysicist/field seismologist , and Housing   CSW Clinical Goal(s): SW will email resources for [employer], rental and bill resources  Interventions:  SW will memail resources no follow up needed  Patient Goals/Self-Care Activities:  Review Agricultural engineer on food, housing and rent and utility resources .  Plan:   No further follow up required: patient asked that the resources be emailed         Please call the Suicide and Crisis Lifeline: 988 go to [facility] [address] [place] [phone]) call 911 if you are experiencing a Mental Health or Behavioral Health Crisis or need someone to talk to.  Patient verbalizes understanding of instructions and care plan provided today and agrees to view in MyChart. Active MyChart status and patient understanding of how to access instructions and care plan via MyChart confirmed with patient.     Tobias CHARM Maranda HEDWIG, PhD [facility], [facility] Social Worker Direct Dial : [phone]  Fax: [phone]

## 2024-03-06 ENCOUNTER — Ambulatory Visit: Payer: MEDICAID | Admitting: Physical Medicine and Rehabilitation

## 2024-03-06 ENCOUNTER — Other Ambulatory Visit: Payer: MEDICAID

## 2024-03-06 ENCOUNTER — Telehealth: Payer: Self-pay | Admitting: Family Medicine

## 2024-03-06 NOTE — Telephone Encounter (Signed)
 Copied from CRM [id number]. Topic: General - Call Back - No Documentation >> Mar 06, 2024  3:24 PM [name] wrote: Reason for CRM: Patient is calling the pharmacist for the office back, please contact the patient back.

## 2024-03-06 NOTE — Progress Notes (Signed)
   03/06/2024  Patient ID: [name], female   DOB: [birth year], 38 y.o.   MRN: [id number]  Outreach attempt for scheduled telephone visit to address control of diabetes was not successful.  I was able to leave HIPAA compliant voicemail with my direct phone number and am sending a MyChart message to attempt to reschedule visit.  Patient sees PCP again tomorrow, so I am passing the below information along:  Diabetes: -Prescribed Trulicity  3mg  weekly, Basaglar  50 units daily, Novolog  22 units TID -Patient last seen by Dr. Dartha with endo in April with no follow-up scheduled at this time -A1c 13% in April -Last fill dates: Basaglar  30 day supply January 2025 Trulicity  3mg  30 day supply end of May 2025 Insulin  Lispro 30 day supply end of May 2025 -Test claims for diabetes medications reflect [place] pharmacies are not contracted to fill 90 day supplies; however 30 day supplies can be filled.  Insurance prefers Humalog  for meal-time insulin  and Semglee  for long-acting insulin .  Test claims for [facility] 3 sensors reflect $40 copay for 1 month supply, and Trulicity  3mg  would be $25 for one month.  Hyperlipidemia: -Prescribed atorvastatin  20mg  daily -Last lipid panel >1 year ago- LDL was 114 -No fill history for atorvastatin   Asthma: -Recently prescribed budesonide -formoterol  160/4.5mcg, but this has not been filled -Test claims reflect this medication as well as generic Advair and Wixela require a prior authorization  Channing DELENA Mealing, PharmD, DPLA

## 2024-03-07 ENCOUNTER — Other Ambulatory Visit (HOSPITAL_COMMUNITY): Payer: Self-pay

## 2024-03-07 ENCOUNTER — Ambulatory Visit: Payer: MEDICAID | Attending: Family Medicine | Admitting: Audiology

## 2024-03-07 ENCOUNTER — Encounter: Payer: Self-pay | Admitting: Family Medicine

## 2024-03-07 ENCOUNTER — Ambulatory Visit (INDEPENDENT_AMBULATORY_CARE_PROVIDER_SITE_OTHER): Payer: MEDICAID | Admitting: Family Medicine

## 2024-03-07 VITALS — BP 122/79 | HR 77 | Temp 96.7°F | Resp 18 | Ht 68.0 in | Wt 258.8 lb

## 2024-03-07 DIAGNOSIS — E119 Type 2 diabetes mellitus without complications: Secondary | ICD-10-CM | POA: Diagnosis not present

## 2024-03-07 DIAGNOSIS — I10 Essential (primary) hypertension: Secondary | ICD-10-CM

## 2024-03-07 DIAGNOSIS — Z23 Encounter for immunization: Secondary | ICD-10-CM

## 2024-03-07 DIAGNOSIS — O10919 Unspecified pre-existing hypertension complicating pregnancy, unspecified trimester: Secondary | ICD-10-CM

## 2024-03-07 DIAGNOSIS — J4520 Mild intermittent asthma, uncomplicated: Secondary | ICD-10-CM

## 2024-03-07 DIAGNOSIS — Z0000 Encounter for general adult medical examination without abnormal findings: Secondary | ICD-10-CM | POA: Diagnosis not present

## 2024-03-07 DIAGNOSIS — R202 Paresthesia of skin: Secondary | ICD-10-CM

## 2024-03-07 DIAGNOSIS — H903 Sensorineural hearing loss, bilateral: Secondary | ICD-10-CM | POA: Insufficient documentation

## 2024-03-07 DIAGNOSIS — Z7985 Long-term (current) use of injectable non-insulin antidiabetic drugs: Secondary | ICD-10-CM | POA: Diagnosis not present

## 2024-03-07 DIAGNOSIS — Z6841 Body Mass Index (BMI) 40.0 and over, adult: Secondary | ICD-10-CM

## 2024-03-07 LAB — COMPREHENSIVE METABOLIC PANEL WITH GFR
ALT: 10 U/L (ref 0–35)
AST: 10 U/L (ref 0–37)
Albumin: 4.1 g/dL (ref 3.5–5.2)
Alkaline Phosphatase: 92 U/L (ref 39–117)
BUN: 8 mg/dL (ref 6–23)
CO2: 29 meq/L (ref 19–32)
Calcium: 9.2 mg/dL (ref 8.4–10.5)
Chloride: 99 meq/L (ref 96–112)
Creatinine, Ser: 0.77 mg/dL (ref 0.40–1.20)
GFR: 98.75 mL/min (ref >60.00)
Glucose, Bld: 303 mg/dL — ABNORMAL HIGH (ref 70–99)
Potassium: 3.9 meq/L (ref 3.5–5.1)
Sodium: 134 meq/L — ABNORMAL LOW (ref 135–145)
Total Bilirubin: 0.3 mg/dL (ref 0.2–1.2)
Total Protein: 7.4 g/dL (ref 6.0–8.3)

## 2024-03-07 LAB — POCT GLYCOSYLATED HEMOGLOBIN (HGB A1C): Hemoglobin A1C: 11.1 % — AB (ref 4.0–5.6)

## 2024-03-07 LAB — CBC WITH DIFFERENTIAL/PLATELET
Basophils Absolute: 0 K/uL (ref 0.0–0.1)
Basophils Relative: 0.3 % (ref 0.0–3.0)
Eosinophils Absolute: 0.1 K/uL (ref 0.0–0.7)
Eosinophils Relative: 1.1 % (ref 0.0–5.0)
HCT: 34.4 % — ABNORMAL LOW (ref 36.0–46.0)
Hemoglobin: 10.8 g/dL — ABNORMAL LOW (ref 12.0–15.0)
Lymphocytes Relative: 33.1 % (ref 12.0–46.0)
Lymphs Abs: 1.8 K/uL (ref 0.7–4.0)
MCHC: 31.5 g/dL (ref 30.0–36.0)
MCV: 68.9 fl — ABNORMAL LOW (ref 78.0–100.0)
Monocytes Absolute: 0.3 K/uL (ref 0.1–1.0)
Monocytes Relative: 5.7 % (ref 3.0–12.0)
Neutro Abs: 3.2 K/uL (ref 1.4–7.7)
Neutrophils Relative %: 59.8 % (ref 43.0–77.0)
Platelets: 348 K/uL (ref 150.0–400.0)
RBC: 4.99 Mil/uL (ref 3.87–5.11)
RDW: 18.2 % — ABNORMAL HIGH (ref 11.5–15.5)
WBC: 5.3 K/uL (ref 4.0–10.5)

## 2024-03-07 LAB — LIPID PANEL
Cholesterol: 205 mg/dL — ABNORMAL HIGH (ref 0–200)
HDL: 49 mg/dL (ref >39.00)
LDL Cholesterol: 131 mg/dL — ABNORMAL HIGH (ref 0–99)
NonHDL: 155.67
Total CHOL/HDL Ratio: 4
Triglycerides: 125 mg/dL (ref 0.0–149.0)
VLDL: 25 mg/dL (ref 0.0–40.0)

## 2024-03-07 LAB — MICROALBUMIN / CREATININE URINE RATIO
Creatinine,U: 43.1 mg/dL
Microalb Creat Ratio: UNDETERMINED mg/g (ref 0.0–30.0)
Microalb, Ur: <0.7 mg/dL

## 2024-03-07 LAB — TSH: TSH: 1.44 u[IU]/mL (ref 0.35–5.50)

## 2024-03-07 MED ORDER — TRELEGY ELLIPTA 100-62.5-25 MCG/ACT IN AEPB
1.0000 | INHALATION_SPRAY | Freq: Every day | RESPIRATORY_TRACT | 11 refills | Status: AC
  Filled 2024-03-07: qty 60, 30d supply, fill #0
  Filled 2024-04-05: qty 60, 30d supply, fill #1

## 2024-03-07 MED ORDER — OZEMPIC (0.25 OR 0.5 MG/DOSE) 2 MG/3ML ~~LOC~~ SOPN
0.5000 mg | PEN_INJECTOR | SUBCUTANEOUS | 2 refills | Status: DC
  Filled 2024-03-07: qty 3, 28d supply, fill #0
  Filled 2024-03-30 – 2024-04-05 (×2): qty 3, 28d supply, fill #1

## 2024-03-07 NOTE — Addendum Note (Signed)
 Addended by: EUGENIE ULANDA CROME on: 03/07/2024 09:58 AM   Modules accepted: Orders

## 2024-03-07 NOTE — Procedures (Signed)
  Outpatient Audiology and [facility] [address] [place], [place]  [address] [phone]  AUDIOLOGICAL  EVALUATION  NAME: [name]     DOB:   1986/07/03      MRN: [id number]                                                                                     DATE: 03/07/2024     REFERENT: Sebastian Beverley NOVAK, MD STATUS: Outpatient DIAGNOSIS: Sensorineural hearing loss  History: [name] was seen for an audiological evaluation due to concerns regarding her hearing sensitivity.  She reports she has a longstanding history of hearing loss.  She reports she was previously seen in [place]  a few years ago and was in the process of obtaining hearing aids.  She reports she had to relocate to [facility] and never had time to finish the process of getting amplification. [name] reports her children have been born with hearing loss and there is a family history of congenital hearing loss.  She denies otalgia, tinnitus, aural fullness, and dizziness.  Evaluation:  Otoscopy showed a clear view of the tympanic membranes, bilaterally Tympanometry results were consistent with normal middle ear pressure and normal tympanic membrane mobility in both ears Audiometric testing was completed using Conventional Audiometry techniques with insert earphones and TDH headphones. Test results are consistent with a mild to moderate sensorineural hearing loss in both ears. Speech Recognition Thresholds were obtained at 30 dB HL in the right ear and at 30 dB HL in the left ear. Word Recognition Testing was completed at 70 dB HL and Kallee scored 100% in both ears  Results:  The test results were reviewed with [name].  Today's test results are consistent with a mild to moderate sensorineural hearing loss in both ears. [name] may have hearing and communication difficulty in adverse listening environments.  She will benefit from the use of good communication strategies and the use of  amplification. [name] was given information on hearing aid providers in the area.  She was also counseled to inquire with her insurance about a possible hearing aid benefit and where to go in the area for hearing aids with her insurance.  Recommendations: Communication Needs Assessment to further discuss hearing aids. [name] was given information on [employer] hearing aids and for Medicaid hearing aids at [facility].  Monitor hearing sensitivity.    30 minutes spent testing and counseling on results.   If you have any questions please feel free to contact me at [phone].  Darryle Posey Audiologist, Au.D., CCC-A 03/07/2024  3:58 PM  Cc: Sebastian Beverley NOVAK, MD

## 2024-03-07 NOTE — Progress Notes (Signed)
 Assessment & Plan   Assessment/Plan:    Assessment & Plan Adult Wellness Visit Routine wellness visit with discussion on general health maintenance, including vaccinations and lifestyle modifications. Addressed work [employer]video for medical appointments. 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 A1c decreased from 15% to 11.1% over 10 months, indicating improvement but still uncontrolled. Current treatment with Trulicity  is not fully effective. Discussed switching to Ozempic  (semaglutide ) for better glycemic control and potential weight management benefits. Discussed dietary modifications, including reducing soda intake and considering seltzer water as an alternative. Discussed healthy alcohol use, recommending a glass of red wine daily for potential health benefits. Discussed potential GI side effects of Ozempic  and the need to monitor for these. - Start Ozempic  (semaglutide ) 0.5 mg weekly for one month - Monitor for GI side effects such as nausea - Discuss with endocrinologist about increasing Ozempic  dose if tolerated - Encourage dietary modifications, including reducing soda intake and considering seltzer water - Recommend one glass of red wine daily  Asthma Asthma management with current inhalers. She reports better response to Trelegy compared to Symbicort . Discussed potential switch to Trelegy or Breztri if Symbicort  is not effective. Weather changes may exacerbate symptoms. - Consider switching to Trelegy or Breztri if Symbicort  is not effective  Headache Current headache, possibly related to weather changes.  Right foot  pain with neuropathy Right foot pain with neuropathy, possibly related to previous fracture or footwear. No swelling observed. Discussed potential for diabetic shoes to help with neuropathy pain. - Discuss diabetic shoes with podiatrist for neuropathy pain management      Medications Discontinued During This Encounter  Medication Reason   Dulaglutide  (TRULICITY ) 3 MG/0.5ML SOAJ    Dulaglutide  (TRULICITY ) 3 MG/0.5ML SOAJ     No follow-ups on file.        Subjective:   Encounter date: 03/07/2024  [name] is a 38 y.o. 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   She  has a past medical history of Asthma, COVID-19 (07/04/2020), Diabetes mellitus without complication (HCC), DKA (diabetic ketoacidoses) (08/27/2018), Family history of diabetes mellitus in mother (01/10/2018), Fibroid, GBS (group B streptococcus) infection, Hearing loss, Herpes, Herpes simplex vulvovaginitis (09/06/2020), Hypertension, Newly diagnosed diabetes (HCC) (01/10/2018), Sickle cell trait (HCC) (09/01/2014), and Trichomonas vaginitis..   She presents with chief complaint of Annual Exam (Pt is not fasting today//HM- vaccinations, diabetic eye exam  (referral is needed) ), Knee Pain (Pt c/o of right knee pain for a couple months ), and Migraine (Pt c/o of more frequent headaches than usual. ) .   Discussed the use of AI scribe software for clinical note transcription  with the patient, who gave verbal consent to proceed.  History of Present Illness [name] is a 38 year old female with diabetes and asthma who presents for a hospital follow-up and management of her chronic conditions.  She was recently hospitalized for three days and is now presenting for a follow-up visit. She is currently experiencing a severe headache, which she describes as a 'real bad headache'. Her headaches often occur with weather changes, a pattern also noted by her husband.  She has a history of diabetes with recent improvement in her A1c levels, decreasing from 15 to 11.1 over the past ten months. Her symptoms of thirst and frequent urination have improved, although she continues to consume large amounts of fluids, including Diet Pepsi and water. She is attempting to reduce her soda intake and increase her water consumption, with plans to return to drinking lemon water. She is currently on Trulicity  but has not taken it in two to three weeks. She would like to try Ozempic , but has faced insurance barriers in the past.  She has asthma, and weather changes exacerbate her symptoms. She is using inhalers, including Symbicort , but finds Trelegy, which she used previously, to be more effective. She is still trying to adapt to the current inhalers prescribed to her.  She experiences sharp pain in her right foot, which she describes as a 'walking fracture' type pain. She has a history of neuropathy and has been referred to a podiatrist. The pain is sharp and localized to the right foot, and she manages it by elevating her feet and using hot water in the shower.  She has stopped smoking and drinking alcohol, noting that alcohol affects her blood sugar levels. She  works with dialysis patients and is aware of the importance of vaccinations, which she is due for.     ROS  Past Surgical History:  Procedure Laterality Date   CESAREAN SECTION N/A 01/08/2016   Procedure: CESAREAN SECTION;  Surgeon: Bebe Furry, MD;  Location: [facility] BIRTHING SUITES;  Service: Obstetrics;  Laterality: N/A;   CESAREAN SECTION N/A 09/10/2020   Procedure: CESAREAN SECTION;  Surgeon: Barbra Lang PARAS, DO;  Location: [facility] LD ORS;  Service: Obstetrics;  Laterality: N/A;   DILATION AND CURETTAGE OF UTERUS     TUBAL LIGATION  09/10/2020   Procedure: BILATERAL TUBAL LIGATION;  Surgeon: Barbra Lang PARAS, DO;  Location: [facility] LD ORS;  Service: Obstetrics;;   WISDOM TOOTH EXTRACTION      Outpatient Medications Prior to Visit  Medication Sig Dispense Refill   Accu-Chek Softclix Lancets lancets Use to check blood sugar 3 times a day 300 each 5   albuterol  (VENTOLIN  HFA) 108 (90 Base) MCG/ACT inhaler Inhale 2 puffs into the lungs every 6 (six) hours as needed for wheezing or shortness of breath. 18 g 5   atomoxetine  (STRATTERA ) 40 MG capsule Take 1 capsule (40 mg total) by mouth daily. 30 capsule 1   atorvastatin  (LIPITOR) 20 MG tablet Take 1 tablet (20 mg total) by mouth daily. 90 tablet 1   Blood Glucose Monitoring Suppl (ACCU-CHEK GUIDE) w/Device KIT Use to check blood sugar 3 times a day 1 kit 0   budesonide -formoterol  (SYMBICORT ) 160-4.5 MCG/ACT inhaler Inhale 2 puffs into the lungs 2 (two) times daily. 10.2 g 3   Continuous Blood Gluc Receiver (FREESTYLE LIBRE 3 READER) DEVI 1 Device by Does not apply route in the morning, at noon, in the evening, and at bedtime. (Patient taking differently: 1 Device by Does  not apply route See admin instructions. Use as directed, changing every 14 days.) 1 each 5   Continuous Glucose Sensor (FREESTYLE LIBRE 3 PLUS SENSOR) MISC Inject 1 sensor into the skin for continuous blood glucose monitoring, change every 15 days. 6 each 3   cyclobenzaprine  (FLEXERIL )  10 MG tablet Take 1 tablet (10 mg total) by mouth 2 (two) times daily as needed for muscle spasms. 20 tablet 1   diclofenac  Sodium (VOLTAREN ) 1 % GEL Apply 2 g topically 4 (four) times daily. 100 g 0   escitalopram  (LEXAPRO ) 10 MG tablet Take 1 tablet (10 mg total) by mouth daily. 30 tablet 1   gabapentin  (NEURONTIN ) 300 MG capsule Take 1 capsule (300 mg total) by mouth 3 (three) times daily. 90 capsule 3   Glucagon  (GVOKE HYPOPEN  1-PACK) 1 MG/0.2ML SOAJ Inject 1 mg into the skin as needed (low blood sugar with impaired consciousness). 0.4 mL 2   glucose blood (ACCU-CHEK GUIDE TEST) test strip Use to check blood sugar 3 times a day 300 each 5   Insulin  Aspart FlexPen (NOVOLOG ) 100 UNIT/ML Inject 15 Units into the skin. 22-24 units     Insulin  Glargine (BASAGLAR  KWIKPEN) 100 UNIT/ML Inject 50 Units into the skin daily. 45 mL 3   insulin  glargine (LANTUS ) 100 UNIT/ML injection Inject 45 Units into the skin at bedtime.     insulin  isophane & regular human KwikPen (HUMULIN  70/30 KWIKPEN) (70-30) 100 UNIT/ML KwikPen Inject 22 Units into the skin See admin instructions. Before meals.     insulin  lispro (HUMALOG ) 100 UNIT/ML KwikPen Inject 15 Units into the skin 3 (three) times daily with meals. 45 mL 1   Insulin  Pen Needle (AUM MINI INSULIN  PEN NEEDLE) 32G X 6 MM MISC Use with Lantus  nightly and NPH twice daily 100 each 11   methocarbamol  (ROBAXIN ) 500 MG tablet Take 1 tablet (500 mg total) by mouth 2 (two) times daily. 20 tablet 0   naproxen  (NAPROSYN ) 500 MG tablet Take 1 tablet (500 mg total) by mouth 2 (two) times daily. 20 tablet 1   ondansetron  (ZOFRAN -ODT) 4 MG disintegrating tablet Take 1 tablet (4 mg total) by mouth every 8 (eight) hours as needed for nausea or vomiting. 20 tablet 0   oxyCODONE -acetaminophen  (PERCOCET/ROXICET) 5-325 MG tablet Take 1 tablet by mouth every 4 (four) hours as needed for severe pain (pain score 7-10). 15 tablet 0   tolnaftate  (TINACTIN) 1 % powder Apply 1 Application  topically 2 (two) times daily. 45 g 0   valACYclovir  (VALTREX ) 1000 MG tablet Take 1 tablet (1,000 mg total) by mouth daily for 5 days 5 tablet 2   Dulaglutide  (TRULICITY ) 3 MG/0.5ML SOAJ Inject 3 mg into the skin once a week. 2 mL 6   Dulaglutide  (TRULICITY ) 3 MG/0.5ML SOAJ Inject 3 mg into the skin once a week. 2 mL 0   lidocaine  (LIDODERM ) 5 % Place 1 patch onto the skin daily. Remove & Discard patch within 12 hours or as directed by MD (Patient not taking: Reported on 03/07/2024) 15 patch 0   lidocaine  (LIDODERM ) 5 % Place 1 patch onto the skin daily. Remove & discard patch within 12 hours or as directed by MD. (Patient not taking: Reported on 03/07/2024) 30 patch 2   No facility-administered medications prior to visit.    Family History  Problem Relation Age of Onset   Diabetes Mother    Hypertension Mother    Hyperlipidemia Mother    Heart disease Mother  Breast cancer Mother        28   Ovarian cancer Mother        Unknown age   Diabetes Sister    Diabetes Brother    Breast cancer Cousin    Heart disease Maternal Grandmother     Social History   Socioeconomic History   Marital status: Married    Spouse name: Not on file   Number of children: Not on file   Years of education: Not on file   Highest education level: Not on file  Occupational History   Not on file  Tobacco Use   Smoking status: Some Days    Types: Cigarettes, E-cigarettes   Smokeless tobacco: Never   Tobacco comments:    quit 2013 cigarettes, I vape every once in a blue moon.  Vaping Use   Vaping status: Never Used  Substance and Sexual Activity   Alcohol use: No   Drug use: No   Sexual activity: Yes    Birth control/protection: None  Other Topics Concern   Not on file  Social History Narrative   Not on file   Social Drivers of Health   Financial Resource Strain: High Risk (03/01/2024)   Overall Financial Resource Strain (CARDIA)    Difficulty of Paying Living Expenses: Very hard  Food  Insecurity: Food Insecurity Present (03/01/2024)   Hunger Vital Sign    Worried About Running Out of Food in the Last Year: Often true    Ran Out of Food in the Last Year: Often true  Transportation Needs: At Risk (02/16/2023)   Received from [facility] Needs    In the past 12 months, has lack of transportation kept you from medical appointments, meetings, work or from getting things needed for daily living? (Check all that apply): 2  Physical Activity: Not on File (01/25/2023)   Received from [facility]   Physical Activity    Physical Activity: 0  Stress: Not on File (01/25/2023)   Received from [facility]   Stress    Stress: 0  Social Connections: Not on File (04/08/2023)   Received from [facility]   Social Connections    Connectedness: 0  Intimate Partner Violence: Not At Risk (03/01/2024)   Humiliation, Afraid, Rape, and Kick questionnaire    Fear of Current or Ex-Partner: No    Emotionally Abused: No    Physically Abused: No    Sexually Abused: No                                                                                                  Objective:  Physical Exam: BP 122/79 (BP Location: Left Arm, Patient Position: Sitting, Cuff Size: Large)   Pulse 77   Temp (!) 96.7 F (35.9 C) (Temporal)   Resp 18   Ht 5' 8 (1.727 m)   Wt 258 lb 12.8 oz (117.4 kg)   LMP 03/07/2024 (Exact Date)   SpO2 100%   BMI 39.35 kg/m    Wt Readings from Last 3 Encounters:  03/07/24 258 lb 12.8 oz (117.4 kg)  02/09/24 255 lb 1.2  oz (115.7 kg)  01/24/24 255 lb (115.7 kg)    Physical Exam GENERAL: Alert, cooperative, well developed, no acute distress. HEENT: Normocephalic, normal oropharynx, moist mucous membranes. CHEST: Clear to auscultation bilaterally, no wheezes, rhonchi, or crackles. CARDIOVASCULAR: Normal heart rate and rhythm, S1 and S2 normal without murmurs. ABDOMEN: Soft, non-tender, non-distended, without organomegaly, normal bowel sounds. EXTREMITIES: No cyanosis, edema, or  swelling. NEUROLOGICAL: Cranial nerves grossly intact, moves all extremities without gross motor or sensory deficit.   Physical Exam  DG Hand Complete Right Result Date: 02/09/2024 CLINICAL DATA:  Right hand pain.  No trauma. EXAM: RIGHT HAND - COMPLETE 3+ VIEW COMPARISON:  None Available. FINDINGS: Right hand appears intact. No evidence of acute fracture or subluxation. No focal bone lesion or bone destruction. Bone cortex and trabecular architecture appear intact. No radiopaque soft tissue foreign bodies. IMPRESSION: No acute bony abnormalities. Electronically Signed   By: Elsie Gravely M.D.   On: 02/09/2024 19:11    Recent Results (from the past 2160 hours)  Lipase, blood     Status: None   Collection Time: 12/28/23 12:38 PM  Result Value Ref Range   Lipase 30 11 - 51 U/L    Comment: Performed at [facility], [address], [place], [place] [address]  Comprehensive metabolic panel     Status: Abnormal   Collection Time: 12/28/23 12:38 PM  Result Value Ref Range   Sodium 135 135 - 145 mmol/L   Potassium 3.5 3.5 - 5.1 mmol/L   Chloride 102 98 - 111 mmol/L   CO2 22 22 - 32 mmol/L   Glucose, Bld 180 (H) 70 - 99 mg/dL    Comment: Glucose reference range applies only to samples taken after fasting for at least 8 hours.   BUN 10 6 - 20 mg/dL   Creatinine, Ser 9.15 0.44 - 1.00 mg/dL   Calcium  9.3 8.9 - 10.3 mg/dL   Total Protein 7.6 6.5 - 8.1 g/dL   Albumin 4.1 3.5 - 5.0 g/dL   AST 14 (L) 15 - 41 U/L   ALT 14 0 - 44 U/L   Alkaline Phosphatase 97 38 - 126 U/L   Total Bilirubin 0.6 0.0 - 1.2 mg/dL   GFR, Estimated >39 >39 mL/min    Comment: (NOTE) Calculated using the CKD-EPI Creatinine Equation (2021)    Anion gap 11 5 - 15    Comment: Performed at [facility], [address], [place], [place] [address]  CBC     Status: Abnormal   Collection Time: 12/28/23 12:38 PM  Result Value Ref Range   WBC 5.8 4.0 - 10.5 K/uL   RBC 4.75 3.87 - 5.11  MIL/uL   Hemoglobin 10.2 (L) 12.0 - 15.0 g/dL   HCT 66.0 (L) 63.9 - 53.9 %   MCV 71.4 (L) 80.0 - 100.0 fL   MCH 21.5 (L) 26.0 - 34.0 pg   MCHC 30.1 30.0 - 36.0 g/dL   RDW 83.7 (H) 88.4 - 84.4 %   Platelets 336 150 - 400 K/uL   nRBC 0.0 0.0 - 0.2 %    Comment: Performed at [facility], [address] [place], [place] [address]  Urinalysis, Routine w reflex microscopic -Urine, Clean Catch     Status: Abnormal   Collection Time: 12/28/23 12:38 PM  Result Value Ref Range   Color, Urine YELLOW YELLOW   APPearance CLOUDY (A) CLEAR   Specific Gravity, Urine 1.015 1.005 - 1.030   pH 5.0  5.0 - 8.0   Glucose, UA >=500 (A) NEGATIVE mg/dL   Hgb urine dipstick NEGATIVE NEGATIVE   Bilirubin Urine NEGATIVE NEGATIVE   Ketones, ur 5 (A) NEGATIVE mg/dL   Protein, ur NEGATIVE NEGATIVE mg/dL   Nitrite NEGATIVE NEGATIVE   Leukocytes,Ua SMALL (A) NEGATIVE   RBC / HPF 0-5 0 - 5 RBC/hpf   WBC, UA 6-10 0 - 5 WBC/hpf   Bacteria, UA NONE SEEN NONE SEEN   Squamous Epithelial / HPF 11-20 0 - 5 /HPF    Comment: Performed at [facility], [address] [place], [place] [address]  hCG, serum, qualitative     Status: None   Collection Time: 12/28/23 12:38 PM  Result Value Ref Range   Preg, Serum NEGATIVE NEGATIVE    Comment:        THE SENSITIVITY OF THIS METHODOLOGY IS >10 mIU/mL. Performed at [facility], [address] [place], [place] [address]   POC CBG, ED     Status: Abnormal   Collection Time: 12/28/23  1:35 PM  Result Value Ref Range   Glucose-Capillary 149 (H) 70 - 99 mg/dL    Comment: Glucose reference range applies only to samples taken after fasting for at least 8 hours.  Lipase, blood     Status: None   Collection Time: 01/15/24  6:08 PM  Result Value Ref Range   Lipase 36 11 - 51 U/L    Comment: Performed at [facility], [address] [place], [place] [address]  Comprehensive metabolic panel     Status: Abnormal    Collection Time: 01/15/24  6:08 PM  Result Value Ref Range   Sodium 136 135 - 145 mmol/L   Potassium 3.7 3.5 - 5.1 mmol/L   Chloride 103 98 - 111 mmol/L   CO2 26 22 - 32 mmol/L   Glucose, Bld 134 (H) 70 - 99 mg/dL    Comment: Glucose reference range applies only to samples taken after fasting for at least 8 hours.   BUN 11 6 - 20 mg/dL   Creatinine, Ser 9.30 0.44 - 1.00 mg/dL   Calcium  8.9 8.9 - 10.3 mg/dL   Total Protein 6.7 6.5 - 8.1 g/dL   Albumin 3.5 3.5 - 5.0 g/dL   AST 13 (L) 15 - 41 U/L   ALT 12 0 - 44 U/L   Alkaline Phosphatase 78 38 - 126 U/L   Total Bilirubin 0.3 0.0 - 1.2 mg/dL   GFR, Estimated >39 >39 mL/min    Comment: (NOTE) Calculated using the CKD-EPI Creatinine Equation (2021)    Anion gap 7 5 - 15    Comment: Performed at [facility], [address] [place], [place] [address]  CBC     Status: Abnormal   Collection Time: 01/15/24  6:08 PM  Result Value Ref Range   WBC 5.4 4.0 - 10.5 K/uL   RBC 4.36 3.87 - 5.11 MIL/uL   Hemoglobin 9.2 (L) 12.0 - 15.0 g/dL   HCT 69.6 (L) 63.9 - 53.9 %   MCV 69.5 (L) 80.0 - 100.0 fL   MCH 21.1 (L) 26.0 - 34.0 pg   MCHC 30.4 30.0 - 36.0 g/dL   RDW 83.5 (H) 88.4 - 84.4 %   Platelets 377 150 - 400 K/uL    Comment: REPEATED TO VERIFY   nRBC 0.0 0.0 - 0.2 %    Comment: Performed at [facility], [address] [place], [place]  [address]  hCG, serum, qualitative     Status: None   Collection Time: 01/15/24  6:08 PM  Result Value Ref Range   Preg, Serum NEGATIVE NEGATIVE    Comment:        THE SENSITIVITY OF THIS METHODOLOGY IS >10 mIU/mL. Performed at [facility], [address], [place], [place] [address]   CBG monitoring, ED     Status: Abnormal   Collection Time: 02/09/24  9:20 PM  Result Value Ref Range   Glucose-Capillary 224 (H) 70 - 99 mg/dL    Comment: Glucose reference range applies only to samples taken after fasting for at least 8 hours.  POCT glycosylated hemoglobin (Hb A1C)     Status:  Abnormal   Collection Time: 03/07/24  8:59 AM  Result Value Ref Range   Hemoglobin A1C 11.1 (A) 4.0 - 5.6 %   HbA1c POC (<> result, manual entry)     HbA1c, POC (prediabetic range)     HbA1c, POC (controlled diabetic range)          Beverley Adine Hummer, MD, MS

## 2024-03-07 NOTE — Patient Instructions (Signed)
  VISIT SUMMARY: You had a follow-up visit today to manage your chronic conditions, including diabetes and asthma, and to address your recent hospitalization. We also discussed your severe headache, right foot pain, and general health maintenance.  YOUR PLAN: -ADULT WELLNESS VISIT: We discussed your general health maintenance, including the importance of vaccinations and lifestyle modifications. You will receive hepatitis B, meningitis, and HPV vaccines. Please bring your FMLA paperwork to the next appointment for completion. We also need to schedule an eye exam and perform basic lab work to check your diabetic, kidney, and liver function, as well as your blood counts.  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 Your A1c levels have improved but are still not well controlled. We will switch your medication to Ozempic  (semaglutide ) 0.5 mg weekly for one month and monitor for any side effects like nausea. If tolerated, we may increase the dose. Please continue to reduce your soda intake and consider seltzer water as an alternative. One glass of red wine daily is recommended for potential health benefits.  -ASTHMA: Asthma is a condition that affects your airways and can be worsened by weather changes. You mentioned that Trelegy worked better for you than Symbicort . If Symbicort  is not effective, we will consider switching you back to Trelegy or trying Breztri.  -HEADACHE: You are experiencing a severe headache, which may be related to weather changes. We will monitor this and adjust your treatment as needed.  -RIGHT FOOT PAIN WITH NEUROPATHY: You have pain in your right foot, possibly related to nerve damage or a previous fracture. We discussed the potential benefits of diabetic shoes to help manage this pain. Please discuss this further with your podiatrist.  INSTRUCTIONS: Please follow up with your endocrinologist to  discuss the potential increase in your Ozempic  dose if tolerated. Bring your FMLA paperwork to the next appointment for completion. Schedule an eye exam and complete the basic lab work as discussed. Continue to monitor for any side effects from the new medication and report them to us .

## 2024-03-08 ENCOUNTER — Ambulatory Visit: Payer: Self-pay | Admitting: Family Medicine

## 2024-03-08 ENCOUNTER — Other Ambulatory Visit (HOSPITAL_COMMUNITY): Payer: Self-pay

## 2024-03-08 DIAGNOSIS — E785 Hyperlipidemia, unspecified: Secondary | ICD-10-CM

## 2024-03-08 LAB — URINALYSIS W MICROSCOPIC + REFLEX CULTURE
Bacteria, UA: NONE SEEN /HPF
Bilirubin Urine: NEGATIVE
Hyaline Cast: NONE SEEN /LPF
Ketones, ur: NEGATIVE
Leukocyte Esterase: NEGATIVE
Nitrites, Initial: NEGATIVE
Protein, ur: NEGATIVE
RBC / HPF: NONE SEEN /HPF (ref 0–2)
Specific Gravity, Urine: 1.022 (ref 1.001–1.035)
pH: 6 (ref 5.0–8.0)

## 2024-03-08 LAB — NO CULTURE INDICATED

## 2024-03-08 MED ORDER — ATORVASTATIN CALCIUM 40 MG PO TABS
40.0000 mg | ORAL_TABLET | Freq: Every day | ORAL | 3 refills | Status: AC
Start: 2024-03-08 — End: ?
  Filled 2024-03-08: qty 30, 30d supply, fill #0
  Filled 2024-04-12: qty 30, 30d supply, fill #1

## 2024-03-09 ENCOUNTER — Other Ambulatory Visit: Payer: Self-pay

## 2024-03-09 ENCOUNTER — Telehealth: Payer: Self-pay

## 2024-03-09 ENCOUNTER — Encounter: Payer: Self-pay | Admitting: Pharmacist

## 2024-03-09 ENCOUNTER — Other Ambulatory Visit (HOSPITAL_COMMUNITY): Payer: Self-pay

## 2024-03-09 NOTE — Progress Notes (Signed)
 Complex Care Management Note Care Guide Note  03/09/2024 Name: [name] MRN: [id number] DOB: 1985-09-03   Complex Care Management Outreach Attempts: An unsuccessful telephone outreach was attempted today to offer the patient information about available complex care management services.  Follow Up Plan:  Additional outreach attempts will be made to offer the patient complex care management information and services.   Encounter Outcome:  No Answer  Dreama [facility]  [facility], [facility] Care Management Assistant Direct Dial : [phone]  Fax: [phone]

## 2024-03-13 ENCOUNTER — Other Ambulatory Visit (HOSPITAL_COMMUNITY): Payer: Self-pay

## 2024-03-13 ENCOUNTER — Encounter: Payer: Self-pay | Admitting: Family Medicine

## 2024-03-13 DIAGNOSIS — H903 Sensorineural hearing loss, bilateral: Secondary | ICD-10-CM

## 2024-03-13 NOTE — Telephone Encounter (Signed)
 Copied from CRM [id number]. Topic: Referral - Request for Referral >> Mar 13, 2024  3:56 PM Rea C wrote: Did the patient discuss referral with their provider in the last year? Yes (If No - schedule appointment) (If Yes - send message)  Appointment offered? No  Type of order/referral and detailed reason for visit: Hearing and speech  due to hearing results   Preference of office, provider, location: [facility]- [place] location   If referral order, have you been seen by this specialty before? No (If Yes, this issue or another issue? When? Where?  Can we respond through MyChart? Yes >> Mar 13, 2024  3:58 PM Rea C wrote: [facility]

## 2024-03-14 ENCOUNTER — Other Ambulatory Visit: Payer: Self-pay

## 2024-03-14 NOTE — Progress Notes (Signed)
 Complex Care Management Care Guide Note  03/14/2024 Name: [name] MRN: [id number] DOB: 1985-11-10  [name] is a 38 y.o. year old female who is a primary care patient of Sebastian Beverley NOVAK, MD and is actively engaged with the care management team. I reached out to Elease GORMAN Pouch by phone today to assist with re-scheduling  with the Pharmacist.  Follow up plan: Telephone appointment with complex care management team member scheduled for:  03/23/24 at 2:00 p.m.   [facility]  [facility] Care Management Assistant Direct Dial : [phone]  Fax: [phone]

## 2024-03-15 DIAGNOSIS — H903 Sensorineural hearing loss, bilateral: Secondary | ICD-10-CM | POA: Insufficient documentation

## 2024-03-21 ENCOUNTER — Ambulatory Visit: Payer: MEDICAID | Admitting: Physical Medicine and Rehabilitation

## 2024-03-21 DIAGNOSIS — M5416 Radiculopathy, lumbar region: Secondary | ICD-10-CM | POA: Diagnosis not present

## 2024-03-21 DIAGNOSIS — G8929 Other chronic pain: Secondary | ICD-10-CM

## 2024-03-21 DIAGNOSIS — M5441 Lumbago with sciatica, right side: Secondary | ICD-10-CM

## 2024-03-21 DIAGNOSIS — M5442 Lumbago with sciatica, left side: Secondary | ICD-10-CM | POA: Diagnosis not present

## 2024-03-21 NOTE — Progress Notes (Signed)
 [name] - 38 y.o. female MRN [id number]  Date of birth: 01/03/1986  Office Visit Note: Visit Date: 03/21/2024 PCP: Sebastian Beverley NOVAK, MD Referred by: Sebastian Beverley NOVAK, MD  Subjective: No chief complaint on file.  HPI: [name] is a 38 y.o. female who comes in today per the request of Dr. Beverley Sebastian for evaluation of chronic, worsening and severe bilateral lower back pain radiating down posterior legs to feet. Pain ongoing for several years, worsens with prolonged sitting and standing. Also reports bending and lifting also cause severe pain. She describes her pain as dull and aching sensation, currently rates as 7 out of 10. Some relief of pain with home exercise regimen, rest and use of medications. She is currently attending formal physical therapy with [facility], some relief of pain with this procedure. Lumbar MRI imaging from 2024 shows crowding of the descending S1 nerve roots in the right L5-S1 subarticular zone related to disc bulging with trace disc extrusion, facet hypertrophy, and a possible component of partially conjoined nerve roots. No high grade spinal canal stenosis noted. She was previously treated by Dr. Toribio Akin with [facility]. Per his notes he was planning for bilateral L5 transforaminal epidural steroid injection, she ultimately decided against injection. Patient denies focal weakness, numbness and tingling. No recent trauma or falls.   History of diabetes mellitus, insulin  dependent. Recent A1C on 03/07/2024 was 11.1. She is currently working as Insurance account manager, reports difficulty performing job due to pain.      Review of Systems  Musculoskeletal:  Positive for back pain.  Neurological:  Negative for tingling, sensory change, focal weakness and weakness.  All other systems reviewed and are negative.  Otherwise per HPI.  Assessment & Plan: Visit Diagnoses:    ICD-10-CM   1. Chronic bilateral low back pain with  bilateral sciatica  M54.42    M54.41    G89.29     2. Lumbar radiculopathy  M54.16     3. Morbid (severe) obesity due to excess calories (HCC)  E66.01        Plan: Findings:  Chronic, worsening and severe bilateral lower back pain radiating down posterior legs to feet. Patient continues to have severe pain despite good conservative therapies such as formal physical therapy, home exercise regimen, rest and use of medications. Patients clinical presentation and exam are consistent with lumbar radiculopathy, more of S1 nerve pattern. There is crowding of the descending S1 nerve roots in the right L5-S1 subarticular zone related to disc bulging with trace disc extrusion. We discussed treatment plan in detail today. We would consider performing bilateral S1 transforaminal epidural steroid injection, however would like patients hemoglobin A1C to be under 10 in order to reduce risk of infection. I discussed injection procedure in detail today, she has no questions at this time. She can follow up with us  as needed, we are happy to reconsider injection at some point. No red flag symptoms noted upon exam today.     Meds & Orders: No orders of the defined types were placed in this encounter.  No orders of the defined types were placed in this encounter.   Follow-up: No follow-ups on file.   Procedures: No procedures performed      Clinical History: MRI LUMBAR SPINE WITHOUT CONTRAST, 06/14/2023 5:39 PM  INDICATION: Lumbar radiculopathy, symptoms persist with > 6 wks treatment, bilateral lower extremity pain radiating form low back, Radiculopathy, lumbar region \ \ M54.16 Radiculopathy, lumbar region  COMPARISON: None  TECHNIQUE: Multiplanar, multisequence surface-coil magnetic resonance imaging of the lumbar spine was performed without contrast.    LEVELS IMAGED: Lower thoracic region to upper sacrum.   FINDINGS:  Alignment: No substantial subluxation.  Vertebrae: Vertebral body heights are  maintained. No marrow signal abnormalities to suggest neoplasm.  Conus medullaris: In normal position. Normal signal and contour.  Degenerative changes:  T12-L1: No substantial canal or foraminal stenosis. L1-L2: No substantial canal or foraminal stenosis. L2-L3: No substantial canal or foraminal stenosis. L3-L4: No substantial canal or foraminal stenosis. L4-L5: Moderate facet hypertrophy and ligamentum flavum thickening with mild bilateral foraminal stenosis. No substantial canal stenosis. L5-S1: Diffuse disc bulge with small superimposed inferiorly migrated central disc extrusion. Moderate facet hypertrophy and mild ligamentum flavum thickening. These changes result in moderate stenosis of the right subarticular zone with crowding of the descending right S1 nerve roots. There is a suspected superimposed component of partially conjoined right L5-S1 nerve roots contributing to nerve root crowding in the subarticular zone. No substantial canal stenosis centrally. Mild bilateral foraminal stenosis.  Upper sacrum: No focal lesion identified.   IMPRESSION:  1.  Crowding of the descending S1 nerve roots in the right L5-S1 subarticular zone related to disc bulging with trace disc extrusion, facet hypertrophy, and a possible component of partially conjoined nerve roots. 2.  No high-grade canal stenosis centrally in the lumbar spine. 3.  Mild foraminal stenosis spanning L4 to S1. Exam End: 06/14/23 17:39  Specimen Collected: 06/15/23 10:32   She reports that she has been smoking cigarettes and e-cigarettes. She has never used smokeless tobacco.  Recent Labs    05/11/23 1430 11/29/23 1346 03/07/24 0859  HGBA1C 15.0* 13.0* 11.1*    Objective:  VS:  HT:    WT:   BMI:     BP:   HR: bpm  TEMP: ( )  RESP:  Physical Exam Vitals and nursing note reviewed.  HENT:     Head: Normocephalic and atraumatic.     Right Ear: External ear normal.     Left Ear: External ear normal.      Mouth/Throat:     Mouth: Mucous membranes are moist.  Eyes:     Extraocular Movements: Extraocular movements intact.     Pupils: Pupils are equal, round, and reactive to light.  Cardiovascular:     Rate and Rhythm: Normal rate.     Pulses: Normal pulses.  Pulmonary:     Effort: Pulmonary effort is normal.  Abdominal:     General: Abdomen is flat. There is no distension.  Musculoskeletal:        General: Tenderness present.     Cervical back: Normal range of motion.     Comments: Patient rises from seated position to standing without difficulty. Good lumbar range of motion. No pain noted with facet loading. 5/5 strength noted with bilateral hip flexion, knee flexion/extension, ankle dorsiflexion/plantarflexion and EHL. No clonus noted bilaterally. No pain upon palpation of greater trochanters. No pain with internal/external rotation of bilateral hips. Sensation intact bilaterally. Dysesthesias noted to bilateral S1 dermatomes. Negative slump test bilaterally. Ambulates without aid, gait steady.     Skin:    General: Skin is warm and dry.     Capillary Refill: Capillary refill takes less than 2 seconds.  Neurological:     General: No focal deficit present.     Mental Status: She is alert and oriented to person, place, and time.  Psychiatric:        Mood  and Affect: Mood normal.        Behavior: Behavior normal.     Ortho Exam  Imaging: No results found.  Past Medical/Family/Surgical/Social History: Medications & Allergies reviewed per EMR, new medications updated. 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) 01/18/2023   Moderate episode of recurrent major depressive disorder (HCC) 09/08/2022   Relationship problem between  partners 09/08/2022   Homeless family 09/08/2022   Vitamin D  deficiency 11/29/2020   Essential hypertension 11/29/2020   Alpha thalassemia silent carrier 03/22/2020   Proteinuria affecting pregnancy 03/01/2020   Uncontrolled diabetes mellitus 02/29/2020   Chronic hypertension affecting pregnancy 02/21/2020   Anxiety 08/27/2018   Type 2 diabetes mellitus without complication, without long-term current use of insulin  (HCC) 08/27/2018   Uncontrolled type 2 diabetes mellitus with hyperglycemia, with long-term current use of insulin  (HCC) 02/18/2018   Paresthesia of both feet 01/10/2018   Other headache syndrome 01/10/2018   Asthma, mild intermittent 08/14/2015   Missed abortion 02/26/2013   BMI 40.0-44.9, adult (HCC) 04/28/2012   Past Medical History:  Diagnosis Date   Asthma    COVID-19 07/04/2020   05/04/20   Diabetes mellitus without complication (HCC)    DKA (diabetic ketoacidoses) 08/27/2018   Family history of diabetes mellitus in mother 01/10/2018   Fibroid    pedunculated posterior RUQ near fundus. approx 3-4cm at time of 01/2016 c-section   GBS (group B streptococcus) infection    Hearing loss    pt states she was tested in high school and told it was selective she says she continues to have a deficit and does not feel it is selective   Herpes    Herpes simplex vulvovaginitis 09/06/2020   Hypertension    Newly diagnosed diabetes (HCC) 01/10/2018   Diagnosed 01/10/18  A1c 7.5%   Sickle cell trait (HCC) 09/01/2014   [ ]  ucx q trimester   Trichomonas vaginitis    Family History  Problem Relation Age of Onset   Diabetes Mother    Hypertension Mother    Hyperlipidemia Mother    Heart disease Mother    Breast cancer Mother        102   Ovarian cancer Mother        Unknown age   Diabetes Sister    Diabetes Brother    Breast cancer Cousin    Heart disease Maternal Grandmother    Past Surgical History:  Procedure Laterality Date   CESAREAN SECTION N/A 01/08/2016   Procedure:  CESAREAN SECTION;  Surgeon: Bebe Furry, MD;  Location: [facility];  Service: Obstetrics;  Laterality: N/A;   CESAREAN SECTION N/A 09/10/2020   Procedure: CESAREAN SECTION;  Surgeon: Barbra Lang PARAS, DO;  Location: [facility];  Service: Obstetrics;  Laterality: N/A;   DILATION AND CURETTAGE OF UTERUS     TUBAL LIGATION  09/10/2020   Procedure: BILATERAL TUBAL LIGATION;  Surgeon: Barbra Lang PARAS, DO;  Location: [facility];  Service: Obstetrics;;   WISDOM TOOTH EXTRACTION     Social History   Occupational History   Not on file  Tobacco Use   Smoking status: Some Days    Types: Cigarettes, E-cigarettes   Smokeless tobacco: Never   Tobacco comments:    quit 2013 cigarettes, I  vape every once in a blue moon.  Vaping Use   Vaping status: Never Used  Substance and Sexual Activity   Alcohol use: No   Drug use: No   Sexual activity: Yes    Birth control/protection: None

## 2024-03-21 NOTE — Progress Notes (Signed)
 Pain Scale   Average Pain 7  Radiating down both legs  Intense dull pain  Numbness and tingling in legs  Gabapentin - provides relief Hot and cold compresses Has not had any injections  Chronic lower back pain  Last x-ray was 06/02/23  1.  Crowding of the descending S1 nerve roots in the right L5-S1 subarticular zone related to disc bulging with trace disc extrusion, facet hypertrophy, and a possible component of partially conjoined nerve roots.  2.  No high-grade canal stenosis centrally in the lumbar spine.  3.  Mild foraminal stenosis spanning L4 to S1. - [facility] 06/14/23

## 2024-03-22 ENCOUNTER — Encounter: Payer: Self-pay | Admitting: Physical Medicine and Rehabilitation

## 2024-03-22 NOTE — Progress Notes (Unsigned)
   03/23/2024 Name: [name] MRN: [id number] DOB: 03/30/1986  Duplicate- see completed visit from 8/21

## 2024-03-23 ENCOUNTER — Other Ambulatory Visit: Payer: Self-pay

## 2024-03-23 DIAGNOSIS — E1165 Type 2 diabetes mellitus with hyperglycemia: Secondary | ICD-10-CM

## 2024-03-23 NOTE — Progress Notes (Signed)
 03/23/2024 Name: [name] MRN: [id number] DOB: 1985-12-16  Chief Complaint  Patient presents with   Diabetes Management Plan   [name] is a 38 y.o. year old female who presented for a telephone visit.   They were referred to the pharmacist by their PCP for assistance in managing diabetes.    Subjective:  Care Team: Primary Care Provider: Sebastian Beverley NOVAK, MD ; Next Scheduled Visit: 9/5  Medication Access/Adherence  Current Pharmacy:  [facility] - [facility] [address] [place] [address] Phone: [phone] Fax: [phone]  [facility] - [facility] [address] [place] [address] Phone: [phone] Fax: [phone]  -Patient reports affordability concerns with their medications: No  -Patient reports access/transportation concerns to their pharmacy: No  -Patient reports adherence concerns with their medications:  Yes    Diabetes: Current medications: Ozempic  0.5mg  weekly, Lantus  50 units daily, Novolog  22-24 units TID with meals -Medications tried in the past: Recently switched to Ozempic  from Trulicity  for additional diabetes control -Patient has had 2-3 doses of Ozempic  0.5mg  and endorses tolerating well with some mild nausea  -Prescribed Libre 3+ sensors but states she has been having trouble getting these filled -Currently using Acc[employer] Chek Guide testing supplies to check BG 1-3 times daily -FBG ranges 110-170 -A1c of 11.1% on 8/5, down from 13% prevously -Patient experiences s/sx of hypoglycemia if BG <120, likely due hyperglycemia for quite some time -Endorses increasing activity as well as dietary modifications (did not give details)  Hyperlipidemia/ASCVD Risk Reduction Current lipid lowering medications: atorvastatin  40mg  daily -Medications tried in the past: none Antiplatelet regimen: none  Objective:  Lab Results  Component Value Date   HGBA1C 11.1 (A)  03/07/2024   Lab Results  Component Value Date   CREATININE 0.77 03/07/2024   BUN 8 03/07/2024   NA 134 (L) 03/07/2024   K 3.9 03/07/2024   CL 99 03/07/2024   CO2 29 03/07/2024   Lab Results  Component Value Date   CHOL 205 (H) 03/07/2024   HDL 49.00 03/07/2024   LDLCALC 131 (H) 03/07/2024   TRIG 125.0 03/07/2024   CHOLHDL 4 03/07/2024   Medications Reviewed Today     Reviewed by Deanna Channing LABOR, RPH (Pharmacist) on 03/23/24 at 1846  Med List Status: <None>   Medication Order Taking? Sig Documenting Provider Last Dose Status Informant  Accu-Chek Softclix Lancets lancets [id number] Yes Use to check blood sugar 3 times a day Motwani, Obadiah, MD  Active   albuterol  (VENTOLIN  HFA) 108 (90 Base) MCG/ACT inhaler [id number] Yes Inhale 2 puffs into the lungs every 6 (six) hours as needed for wheezing or shortness of breath. Sebastian Beverley NOVAK, MD  Active   atomoxetine  (STRATTERA ) 40 MG capsule [id number] Yes Take 1 capsule (40 mg total) by mouth daily. Nwoko, Uchenna E, PA  Active   atorvastatin  (LIPITOR) 40 MG tablet [id number] Yes Take 1 tablet (40 mg total) by mouth daily. Sebastian Beverley NOVAK, MD  Active   Blood Glucose Monitoring Suppl (ACCU-CHEK GUIDE) w/Device KIT [id number] Yes Use to check blood sugar 3 times a day Dartha Obadiah, MD  Active   Continuous Blood Gluc Receiver (FREESTYLE LIBRE 3 READER) DEVI [id number]  1 Device by Does not apply route in the morning, at noon, in the evening, and at bedtime.  Patient not taking: Reported on 03/23/2024   Ngetich, Dinah C, NP  Active Self, Pharmacy Records  Continuous Glucose Sensor (FREESTYLE LIBRE 3 PLUS  SENSOR) MISC [id number]  Inject 1 sensor into the skin for continuous blood glucose monitoring, change every 15 days.  Patient not taking: Reported on 03/23/2024   Motwani, Komal, MD  Active   cyclobenzaprine  (FLEXERIL ) 10 MG tablet [id number] Yes Take 1 tablet (10 mg total) by mouth 2 (two) times daily as needed for muscle spasms. Doretha Folks, MD  Active   diclofenac  Sodium (VOLTAREN ) 1 % GEL [id number] Yes Apply 2 g topically 4 (four) times daily. Beola Terrall RAMAN, PA-C  Active   escitalopram  (LEXAPRO ) 10 MG tablet [id number] Yes Take 1 tablet (10 mg total) by mouth daily. Nwoko, Uchenna E, PA  Active   Fluticasone -Umeclidin-Vilant (TRELEGY ELLIPTA ) 100-62.5-25 MCG/ACT AEPB [id number] Yes Inhale 1 puff into the lungs daily. Sebastian Beverley NOVAK, MD  Active   gabapentin  (NEURONTIN ) 300 MG capsule [id number] Yes Take 1 capsule (300 mg total) by mouth 3 (three) times daily. Lamount Ethan CROME, DPM  Active   Glucagon  (GVOKE HYPOPEN  1-PACK) 1 MG/0.2ML SOAJ [id number]  Inject 1 mg into the skin as needed (low blood sugar with impaired consciousness).  Patient not taking: Reported on 03/23/2024   Motwani, Komal, MD  Active Self, Pharmacy Records  glucose blood (ACCU-CHEK GUIDE TEST) test strip [id number] Yes Use to check blood sugar 3 times a day Motwani, Komal, MD  Active   Insulin  Aspart FlexPen (NOVOLOG ) 100 UNIT/ML [id number] Yes Inject 15 Units into the skin. 22-24 units  Patient taking differently: Inject 22-24 Units into the skin with breakfast, with lunch, and with evening meal. 22-24 units   [provider]  Active   insulin  glargine (LANTUS ) 100 UNIT/ML injection [id number] Yes Inject 45 Units into the skin at bedtime.  Patient taking differently: Inject 50 Units into the skin at bedtime.   [provider]  Active Self, Pharmacy Records           Med Note [name]   Tue May 18, 2023 12:44 PM) No fill dates found. PT reported Lantus  and Humalog  are her two insulins.  Insulin  Pen Needle (AUM MINI INSULIN  PEN NEEDLE) 32G X 6 MM MISC [id number] Yes Use with Lantus  nightly and NPH twice daily   Active Self, Pharmacy Records  naproxen  (NAPROSYN ) 500 MG tablet [id number] Yes Take 1 tablet (500 mg total) by mouth 2 (two) times daily. Doretha Folks, MD  Active   ondansetron  (ZOFRAN -ODT) 4 MG disintegrating tablet [id number]  Yes Take 1 tablet (4 mg total) by mouth every 8 (eight) hours as needed for nausea or vomiting. Keith, Kayla N, PA-C  Active   Semaglutide ,0.25 or 0.5MG /DOS, (OZEMPIC , 0.25 OR 0.5 MG/DOSE,) 2 MG/3ML SOPN [id number] Yes Inject 0.5 mg into the skin once a week. Sebastian Beverley NOVAK, MD  Active   valACYclovir  (VALTREX ) 1000 MG tablet [id number] Yes Take 1 tablet (1,000 mg total) by mouth daily for 5 days Cleatus Moccasin, MD  Active            Assessment/Plan:   Diabetes: -Currently uncontrolled -Reviewed goal A1c, goal fasting, and goal 2 hour post prandial glucose -Continue current regimen at this time -I am coordinating with [facility] for refills on Libre 3+ sensors- it appears patient has primary insurance, which will only cover a 1 month supply at $40 copay; but she also has a Medicaid plan that should cover these -Sees PCP again 9/5, and I recommend increasing Ozempic  to 1mg  weekly if patient continues to tolerate well -Due for A1c again November  Hyperlipidemia/ASCVD Risk Reduction: -Currently  uncontrolled.  -Continue current regimen at this time -I recommend follow-up CMP and Lipid panel in November  Follow Up Plan: 1 month  Channing DELENA Mealing, PharmD, DPLA

## 2024-03-24 ENCOUNTER — Other Ambulatory Visit (HOSPITAL_COMMUNITY): Payer: Self-pay

## 2024-03-24 ENCOUNTER — Telehealth: Payer: Self-pay

## 2024-03-24 NOTE — Telephone Encounter (Signed)
 Demographic sheet was faxed with confirmation; left VM to inform with call back number.if needed.

## 2024-03-24 NOTE — Telephone Encounter (Signed)
 Copied from CRM #[id number]. Topic: General - Other >> Mar 24, 2024 12:13 PM [place] A wrote: Reason for CRM: Levon called from [place] office and said there are no demographics with referral

## 2024-03-27 ENCOUNTER — Other Ambulatory Visit (HOSPITAL_COMMUNITY): Payer: Self-pay

## 2024-03-29 ENCOUNTER — Encounter (HOSPITAL_COMMUNITY): Payer: Self-pay

## 2024-03-30 ENCOUNTER — Other Ambulatory Visit: Payer: Self-pay

## 2024-03-30 ENCOUNTER — Other Ambulatory Visit (HOSPITAL_COMMUNITY): Payer: Self-pay

## 2024-04-05 ENCOUNTER — Other Ambulatory Visit (HOSPITAL_COMMUNITY): Payer: Self-pay

## 2024-04-07 ENCOUNTER — Ambulatory Visit (INDEPENDENT_AMBULATORY_CARE_PROVIDER_SITE_OTHER): Payer: MEDICAID | Admitting: Podiatry

## 2024-04-07 ENCOUNTER — Encounter: Payer: Self-pay | Admitting: Podiatry

## 2024-04-07 ENCOUNTER — Ambulatory Visit: Admitting: Family Medicine

## 2024-04-07 ENCOUNTER — Other Ambulatory Visit (HOSPITAL_COMMUNITY): Payer: Self-pay

## 2024-04-07 ENCOUNTER — Encounter: Payer: Self-pay | Admitting: Family Medicine

## 2024-04-07 VITALS — BP 125/83 | HR 83 | Temp 97.3°F | Resp 18 | Wt 255.4 lb

## 2024-04-07 DIAGNOSIS — J4520 Mild intermittent asthma, uncomplicated: Secondary | ICD-10-CM

## 2024-04-07 DIAGNOSIS — M79675 Pain in left toe(s): Secondary | ICD-10-CM

## 2024-04-07 DIAGNOSIS — E119 Type 2 diabetes mellitus without complications: Secondary | ICD-10-CM

## 2024-04-07 DIAGNOSIS — I10 Essential (primary) hypertension: Secondary | ICD-10-CM

## 2024-04-07 DIAGNOSIS — E1142 Type 2 diabetes mellitus with diabetic polyneuropathy: Secondary | ICD-10-CM

## 2024-04-07 DIAGNOSIS — R110 Nausea: Secondary | ICD-10-CM

## 2024-04-07 DIAGNOSIS — Z0000 Encounter for general adult medical examination without abnormal findings: Secondary | ICD-10-CM

## 2024-04-07 DIAGNOSIS — Z7985 Long-term (current) use of injectable non-insulin antidiabetic drugs: Secondary | ICD-10-CM

## 2024-04-07 DIAGNOSIS — B351 Tinea unguium: Secondary | ICD-10-CM

## 2024-04-07 DIAGNOSIS — Z23 Encounter for immunization: Secondary | ICD-10-CM | POA: Diagnosis not present

## 2024-04-07 DIAGNOSIS — Z6841 Body Mass Index (BMI) 40.0 and over, adult: Secondary | ICD-10-CM

## 2024-04-07 DIAGNOSIS — R202 Paresthesia of skin: Secondary | ICD-10-CM

## 2024-04-07 DIAGNOSIS — M79674 Pain in right toe(s): Secondary | ICD-10-CM | POA: Diagnosis not present

## 2024-04-07 DIAGNOSIS — Z794 Long term (current) use of insulin: Secondary | ICD-10-CM | POA: Diagnosis not present

## 2024-04-07 DIAGNOSIS — M2141 Flat foot [pes planus] (acquired), right foot: Secondary | ICD-10-CM

## 2024-04-07 DIAGNOSIS — Z7984 Long term (current) use of oral hypoglycemic drugs: Secondary | ICD-10-CM | POA: Diagnosis not present

## 2024-04-07 DIAGNOSIS — T383X5A Adverse effect of insulin and oral hypoglycemic [antidiabetic] drugs, initial encounter: Secondary | ICD-10-CM

## 2024-04-07 DIAGNOSIS — O10919 Unspecified pre-existing hypertension complicating pregnancy, unspecified trimester: Secondary | ICD-10-CM

## 2024-04-07 DIAGNOSIS — M2142 Flat foot [pes planus] (acquired), left foot: Secondary | ICD-10-CM

## 2024-04-07 MED ORDER — SEMAGLUTIDE (1 MG/DOSE) 4 MG/3ML ~~LOC~~ SOPN
1.0000 mg | PEN_INJECTOR | SUBCUTANEOUS | 0 refills | Status: DC
Start: 2024-04-07 — End: 2024-04-21
  Filled 2024-04-07: qty 3, 28d supply, fill #0
  Filled 2024-04-12 (×2): qty 9, 84d supply, fill #0
  Filled 2024-04-13: qty 3, 28d supply, fill #0

## 2024-04-07 MED ORDER — PIOGLITAZONE HCL 30 MG PO TABS
30.0000 mg | ORAL_TABLET | Freq: Every day | ORAL | 3 refills | Status: DC
  Filled 2024-04-07: qty 90, 90d supply, fill #0

## 2024-04-07 MED ORDER — EMPAGLIFLOZIN 25 MG PO TABS
25.0000 mg | ORAL_TABLET | Freq: Every day | ORAL | 3 refills | Status: DC
  Filled 2024-04-07: qty 30, 30d supply, fill #0
  Filled 2024-04-12: qty 90, 90d supply, fill #0
  Filled 2024-04-12: qty 30, 30d supply, fill #0

## 2024-04-07 MED ORDER — GABAPENTIN 300 MG PO CAPS
300.0000 mg | ORAL_CAPSULE | Freq: Three times a day (TID) | ORAL | 3 refills | Status: AC
  Filled 2024-04-07 – 2024-04-24 (×2): qty 90, 30d supply, fill #0

## 2024-04-07 MED ORDER — ONDANSETRON 4 MG PO TBDP
4.0000 mg | ORAL_TABLET | Freq: Three times a day (TID) | ORAL | 0 refills | Status: DC | PRN
  Filled 2024-04-07: qty 20, 7d supply, fill #0

## 2024-04-07 NOTE — Progress Notes (Signed)
 Assessment & Plan   Assessment/Plan:    Assessment & Plan Type 2 diabetes mellitus Type 2 diabetes mellitus with recent switch to semaglutide  (Ozempic ) showing improvement in blood glucose control. Current blood glucose levels are mostly within target range, with a recent reading of 145 mg/dL. Nausea from semaglutide  is present but tolerable and improving. Considering escalation of semaglutide  dose to further reduce A1c and potentially reduce insulin  dependency. Discussed potential addition of empagliflozin  (Jardiance ) for further glucose control and renal protection, contingent on kidney function tests. Pioglitazone  considered as an alternative to avoid additional lab checks. Shared decision-making with patient regarding medication adjustments and insulin  reduction strategy. Discussed potential side effects of medications, including nausea with semaglutide  and the absence of typical GI upset with empagliflozin  and pioglitazone . - Increase semaglutide  (Ozempic ) to 1 mg daily. - Start empagliflozin  (Jardiance ) 25 mg daily - Start pioglitazone  30 mg daily. - Reduce mealtime insulin  (Aspart) to 10 units per meal. - Continue long-acting insulin  (Glargine). - Provide prescription for ondansetron  (Zofran ) for nausea management.      Medications Discontinued During This Encounter  Medication Reason   Semaglutide ,0.25 or 0.5MG /DOS, (OZEMPIC , 0.25 OR 0.5 MG/DOSE,) 2 MG/3ML SOPN    ondansetron  (ZOFRAN -ODT) 4 MG disintegrating tablet Reorder    Return in about 2 weeks (around 04/21/2024) for BP, DM.        Subjective:   Encounter date: 04/07/2024  [name] is a 38 y.o. 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   She  has a past medical history of Asthma, COVID-19 (07/04/2020), Diabetes mellitus without complication (HCC), DKA (diabetic ketoacidoses) (08/27/2018), Family history of diabetes mellitus in mother (01/10/2018), Fibroid, GBS (group B streptococcus) infection, Hearing loss, Herpes, Herpes simplex vulvovaginitis (09/06/2020), Hypertension, Newly diagnosed diabetes (HCC) (01/10/2018), Sickle cell trait (HCC) (09/01/2014), and Trichomonas vaginitis..   She presents with chief complaint of FMLA (HM due- HPV 2nd and diabetic eye exam ) and Medication Refill (RX refill request for 90 day supply due to moving at the end of the month ) .   Discussed the use of AI scribe software for clinical note transcription with the patient, who gave verbal consent to proceed.  History of Present Illness [name] is a 38 year old female with diabetes who presents for follow-up and medication management. She is accompanied by her daughter.  She recently switched to Ozempic  about a month ago, and the initial nausea has become more tolerable over time. Her blood sugar levels have improved, with current readings around 145 mg/dL. She uses a Libre device to monitor her glucose levels, which has been mostly in the green range.  Her current regimen includes insulin , specifically Novolog  and Lantus , with doses of 22 to 24 units of short-acting insulin  with meals. She  adjusts her short-acting insulin  based on her meals. She is also taking semaglutide  (Ozempic ).  She is planning to move to  [place] at the end of the month and is seeking FMLA for time off to attend medical appointments. Her work schedule sometimes makes it difficult to coordinate time off for these appointments.     ROS  Past Surgical History:  Procedure Laterality Date   CESAREAN SECTION N/A 01/08/2016   Procedure: CESAREAN SECTION;  Surgeon: Bebe Furry, MD;  Location: [facility] BIRTHING SUITES;  Service: Obstetrics;  Laterality: N/A;   CESAREAN SECTION N/A 09/10/2020   Procedure: CESAREAN SECTION;  Surgeon: Barbra Lang PARAS, DO;  Location: [facility] LD ORS;  Service: Obstetrics;  Laterality: N/A;   DILATION AND CURETTAGE OF UTERUS     TUBAL LIGATION  09/10/2020   Procedure: BILATERAL TUBAL LIGATION;  Surgeon: Barbra Lang PARAS, DO;  Location: [facility] LD ORS;  Service: Obstetrics;;   WISDOM TOOTH EXTRACTION      Outpatient Medications Prior to Visit  Medication Sig Dispense Refill   Accu-Chek Softclix Lancets lancets Use to check blood sugar 3 times a day 300 each 5   albuterol  (VENTOLIN  HFA) 108 (90 Base) MCG/ACT inhaler Inhale 2 puffs into the lungs every 6 (six) hours as needed for wheezing or shortness of breath. 18 g 5   atomoxetine  (STRATTERA ) 40 MG capsule Take 1 capsule (40 mg total) by mouth daily. 30 capsule 1   atorvastatin  (LIPITOR) 40 MG tablet Take 1 tablet (40 mg total) by mouth daily. 90 tablet 3   Blood Glucose Monitoring Suppl (ACCU-CHEK GUIDE) w/Device KIT Use to check blood sugar 3 times a day 1 kit 0   Continuous Glucose Sensor (FREESTYLE LIBRE 3 PLUS SENSOR) MISC Inject 1 sensor into the skin for continuous blood glucose monitoring, change every 15 days. 6 each 3   cyclobenzaprine  (FLEXERIL ) 10 MG tablet Take 1 tablet (10 mg total) by mouth 2 (two) times daily as needed for muscle spasms. 20 tablet 1   diclofenac  Sodium (VOLTAREN ) 1 % GEL Apply 2 g topically 4 (four) times daily.  100 g 0   escitalopram  (LEXAPRO ) 10 MG tablet Take 1 tablet (10 mg total) by mouth daily. 30 tablet 1   Fluticasone -Umeclidin-Vilant (TRELEGY ELLIPTA ) 100-62.5-25 MCG/ACT AEPB Inhale 1 puff into the lungs daily. 60 each 11   gabapentin  (NEURONTIN ) 300 MG capsule Take 1 capsule (300 mg total) by mouth 3 (three) times daily. 90 capsule 3   glucose blood (ACCU-CHEK GUIDE TEST) test strip Use to check blood sugar 3 times a day 300 each 5   Insulin  Aspart FlexPen (NOVOLOG ) 100 UNIT/ML Inject 15 Units into the skin. 22-24 units     insulin  aspart protamine- aspart (NOVOLOG  MIX 70/30) (70-30) 100 UNIT/ML injection Inject into the skin.     insulin  glargine (LANTUS ) 100 UNIT/ML injection Inject 45 Units into the skin at bedtime. (Patient taking differently: Inject 50 Units into the skin at bedtime.)     Insulin  Pen Needle (AUM MINI INSULIN  PEN NEEDLE) 32G X 6 MM MISC Use with Lantus  nightly and NPH twice daily 100 each 11   naproxen  (NAPROSYN ) 500 MG tablet Take 1 tablet (500 mg total) by mouth 2 (two) times daily. 20 tablet 1   valACYclovir  (VALTREX ) 1000 MG tablet Take 1 tablet (1,000 mg total) by mouth daily for 5 days 5 tablet 2   ondansetron  (ZOFRAN -ODT) 4 MG disintegrating tablet Take 1 tablet (4 mg total) by mouth every 8 (eight) hours as needed for nausea or vomiting. 20 tablet 0   Semaglutide ,0.25 or 0.5MG /DOS, (OZEMPIC , 0.25 OR 0.5 MG/DOSE,) 2 MG/3ML SOPN Inject 0.5  mg into the skin once a week. 3 mL 2   No facility-administered medications prior to visit.    Family History  Problem Relation Age of Onset   Diabetes Mother    Hypertension Mother    Hyperlipidemia Mother    Heart disease Mother    Breast cancer Mother        49   Ovarian cancer Mother        Unknown age   Diabetes Sister    Diabetes Brother    Breast cancer Cousin    Heart disease Maternal Grandmother     Social History   Socioeconomic History   Marital status: Married    Spouse name: Not on file   Number of  children: Not on file   Years of education: Not on file   Highest education level: Some college, no degree  Occupational History   Not on file  Tobacco Use   Smoking status: Not on file    Passive exposure: Never   Smokeless tobacco: Former   Tobacco comments:    quit 2013 cigarettes, I vape every once in a blue moon.        Pt does not smoke or vape   Vaping Use   Vaping status: Former  Substance and Sexual Activity   Alcohol use: No   Drug use: No   Sexual activity: Yes    Birth control/protection: None  Other Topics Concern   Not on file  Social History Narrative   Not on file   Social Drivers of Health   Financial Resource Strain: High Risk (04/07/2024)   Overall Financial Resource Strain (CARDIA)    Difficulty of Paying Living Expenses: Very hard  Food Insecurity: Food Insecurity Present (04/07/2024)   Hunger Vital Sign    Worried About Running Out of Food in the Last Year: Often true    Ran Out of Food in the Last Year: Often true  Transportation Needs: No Transportation Needs (04/07/2024)   PRAPARE - Administrator, Civil Service (Medical): No    Lack of Transportation (Non-Medical): No  Physical Activity: Sufficiently Active (04/07/2024)   Exercise Vital Sign    Days of Exercise per Week: 4 days    Minutes of Exercise per Session: 150+ min  Stress: Stress Concern Present (04/07/2024)   [employer] of Occupational Health - Occupational Stress Questionnaire    Feeling of Stress: To some extent  Social Connections: Moderately Integrated (04/07/2024)   Social Connection and Isolation Panel    Frequency of Communication with Friends and Family: Twice a week    Frequency of Social Gatherings with Friends and Family: Never    Attends Religious Services: More than 4 times per year    Active Member of [employer] or Organizations: Yes    Attends Banker Meetings: More than 4 times per year    Marital Status: Married  Catering manager Violence: Not At  Risk (03/01/2024)   Humiliation, Afraid, Rape, and Kick questionnaire    Fear of Current or Ex-Partner: No    Emotionally Abused: No    Physically Abused: No    Sexually Abused: No  Objective:  Physical Exam: BP 125/83 (BP Location: Left Arm, Patient Position: Sitting, Cuff Size: Large)   Pulse 83   Temp (!) 97.3 F (36.3 C) (Temporal)   Resp 18   Wt 255 lb 6.4 oz (115.8 kg)   LMP 04/07/2024 (Exact Date)   SpO2 100%   BMI 38.83 kg/m    Physical Exam GENERAL: Alert, cooperative, well developed, no acute distress. HEENT: Normocephalic, normal oropharynx, moist mucous membranes. CHEST: Clear to auscultation bilaterally, no wheezes, rhonchi, or crackles. CARDIOVASCULAR: Normal heart rate and rhythm, S1 and S2 normal without murmurs. ABDOMEN: Soft, non-tender, non-distended, without organomegaly, normal bowel sounds. EXTREMITIES: No cyanosis or edema. NEUROLOGICAL: Cranial nerves grossly intact, moves all extremities without gross motor or sensory deficit.   Physical Exam  DG Hand Complete Right Result Date: 02/09/2024 CLINICAL DATA:  Right hand pain.  No trauma. EXAM: RIGHT HAND - COMPLETE 3+ VIEW COMPARISON:  None Available. FINDINGS: Right hand appears intact. No evidence of acute fracture or subluxation. No focal bone lesion or bone destruction. Bone cortex and trabecular architecture appear intact. No radiopaque soft tissue foreign bodies. IMPRESSION: No acute bony abnormalities. Electronically Signed   By: Elsie Gravely M.D.   On: 02/09/2024 19:11    Recent Results (from the past 2160 hours)  Lipase, blood     Status: None   Collection Time: 01/15/24  6:08 PM  Result Value Ref Range   Lipase 36 11 - 51 U/L    Comment: Performed at [facility], [address] [place], [place] [address]  Comprehensive metabolic panel     Status: Abnormal   Collection Time: 01/15/24   6:08 PM  Result Value Ref Range   Sodium 136 135 - 145 mmol/L   Potassium 3.7 3.5 - 5.1 mmol/L   Chloride 103 98 - 111 mmol/L   CO2 26 22 - 32 mmol/L   Glucose, Bld 134 (H) 70 - 99 mg/dL    Comment: Glucose reference range applies only to samples taken after fasting for at least 8 hours.   BUN 11 6 - 20 mg/dL   Creatinine, Ser 9.30 0.44 - 1.00 mg/dL   Calcium  8.9 8.9 - 10.3 mg/dL   Total Protein 6.7 6.5 - 8.1 g/dL   Albumin 3.5 3.5 - 5.0 g/dL   AST 13 (L) 15 - 41 U/L   ALT 12 0 - 44 U/L   Alkaline Phosphatase 78 38 - 126 U/L   Total Bilirubin 0.3 0.0 - 1.2 mg/dL   GFR, Estimated >39 >39 mL/min    Comment: (NOTE) Calculated using the CKD-EPI Creatinine Equation (2021)    Anion gap 7 5 - 15    Comment: Performed at [facility], [address] [place], [place] [address]  CBC     Status: Abnormal   Collection Time: 01/15/24  6:08 PM  Result Value Ref Range   WBC 5.4 4.0 - 10.5 K/uL   RBC 4.36 3.87 - 5.11 MIL/uL   Hemoglobin 9.2 (L) 12.0 - 15.0 g/dL   HCT 69.6 (L) 63.9 - 53.9 %   MCV 69.5 (L) 80.0 - 100.0 fL   MCH 21.1 (L) 26.0 - 34.0 pg   MCHC 30.4 30.0 - 36.0 g/dL   RDW 83.5 (H) 88.4 - 84.4 %   Platelets 377 150 - 400 K/uL    Comment: REPEATED TO VERIFY   nRBC 0.0 0.0 - 0.2 %    Comment: Performed at [facility], [address], [place], [place] [address]  hCG, serum, qualitative     Status: None   Collection Time: 01/15/24  6:08 PM  Result Value Ref Range   Preg, Serum NEGATIVE NEGATIVE    Comment:        THE SENSITIVITY OF THIS METHODOLOGY IS >10 mIU/mL. Performed at [facility], [address], [place], [place] [address]   CBG monitoring, ED     Status: Abnormal   Collection Time: 02/09/24  9:20 PM  Result Value Ref Range   Glucose-Capillary 224 (H) 70 - 99 mg/dL    Comment: Glucose reference range applies only to samples taken after fasting for at least 8 hours.  POCT glycosylated hemoglobin (Hb A1C)     Status: Abnormal   Collection Time:  03/07/24  8:59 AM  Result Value Ref Range   Hemoglobin A1C 11.1 (A) 4.0 - 5.6 %   HbA1c POC (<> result, manual entry)     HbA1c, POC (prediabetic range)     HbA1c, POC (controlled diabetic range)    TSH     Status: None   Collection Time: 03/07/24  9:55 AM  Result Value Ref Range   TSH 1.44 0.35 - 5.50 uIU/mL  Lipid panel     Status: Abnormal   Collection Time: 03/07/24  9:55 AM  Result Value Ref Range   Cholesterol 205 (H) 0 - 200 mg/dL    Comment: ATP III Classification       Desirable:  < 200 mg/dL               Borderline High:  200 - 239 mg/dL          High:  > = 759 mg/dL   Triglycerides 874.9 0.0 - 149.0 mg/dL    Comment: Normal:  <849 mg/dLBorderline High:  150 - 199 mg/dL   HDL 50.99 >60.99 mg/dL   VLDL 74.9 0.0 - 59.9 mg/dL   LDL Cholesterol 868 (H) 0 - 99 mg/dL   Total CHOL/HDL Ratio 4     Comment:                Men          Women1/2 Average Risk     3.4          3.3Average Risk          5.0          4.42X Average Risk          9.6          7.13X Average Risk          15.0          11.0                       NonHDL 155.67     Comment: NOTE:  Non-HDL goal should be 30 mg/dL higher than patient's LDL goal (i.e. LDL goal of < 70 mg/dL, would have non-HDL goal of < 100 mg/dL)  Microalbumin / creatinine urine ratio     Status: None   Collection Time: 03/07/24  9:55 AM  Result Value Ref Range   Microalb, Ur <0.7 mg/dL   Creatinine,U 56.8 mg/dL   Microalb Creat Ratio Unable to calculate 0.0 - 30.0 mg/g    Comment: Unable to Calculate due to Microalbumin Result of <0.7 mg/dL  CBC with Differential/Platelet     Status: Abnormal   Collection Time: 03/07/24  9:55 AM  Result Value Ref  Range   WBC 5.3 4.0 - 10.5 K/uL   RBC 4.99 3.87 - 5.11 Mil/uL   Hemoglobin 10.8 (L) 12.0 - 15.0 g/dL   HCT 65.5 (L) 63.9 - 53.9 %   MCV 68.9 Repeated and verified X2. (L) 78.0 - 100.0 fl   MCHC 31.5 30.0 - 36.0 g/dL   RDW 81.7 (H) 88.4 - 84.4 %   Platelets 348.0 150.0 - 400.0 K/uL   Neutrophils  Relative % 59.8 43.0 - 77.0 %   Lymphocytes Relative 33.1 12.0 - 46.0 %   Monocytes Relative 5.7 3.0 - 12.0 %   Eosinophils Relative 1.1 0.0 - 5.0 %   Basophils Relative 0.3 0.0 - 3.0 %   Neutro Abs 3.2 1.4 - 7.7 K/uL   Lymphs Abs 1.8 0.7 - 4.0 K/uL   Monocytes Absolute 0.3 0.1 - 1.0 K/uL   Eosinophils Absolute 0.1 0.0 - 0.7 K/uL   Basophils Absolute 0.0 0.0 - 0.1 K/uL  Comprehensive metabolic panel with GFR     Status: Abnormal   Collection Time: 03/07/24  9:55 AM  Result Value Ref Range   Sodium 134 (L) 135 - 145 mEq/L   Potassium 3.9 3.5 - 5.1 mEq/L   Chloride 99 96 - 112 mEq/L   CO2 29 19 - 32 mEq/L   Glucose, Bld 303 (H) 70 - 99 mg/dL   BUN 8 6 - 23 mg/dL   Creatinine, Ser 9.22 0.40 - 1.20 mg/dL   Total Bilirubin 0.3 0.2 - 1.2 mg/dL   Alkaline Phosphatase 92 39 - 117 U/L   AST 10 0 - 37 U/L   ALT 10 0 - 35 U/L   Total Protein 7.4 6.0 - 8.3 g/dL   Albumin 4.1 3.5 - 5.2 g/dL   GFR 01.24 >39.99 mL/min    Comment: Calculated using the CKD-EPI Creatinine Equation (2021)   Calcium  9.2 8.4 - 10.5 mg/dL  Urinalysis w microscopic + reflex cultur     Status: Abnormal   Collection Time: 03/07/24  9:55 AM   Specimen: Blood  Result Value Ref Range   Color, Urine YELLOW YELLOW   APPearance CLEAR CLEAR   Specific Gravity, Urine 1.022 1.001 - 1.035   pH 6.0 5.0 - 8.0   Glucose, UA 3+ (A) NEGATIVE   Bilirubin Urine NEGATIVE NEGATIVE   Ketones, ur NEGATIVE NEGATIVE   Hgb urine dipstick TRACE (A) NEGATIVE   Protein, ur NEGATIVE NEGATIVE   Nitrites, Initial NEGATIVE NEGATIVE   Leukocyte Esterase NEGATIVE NEGATIVE   WBC, UA 0-5 0 - 5 /HPF   RBC / HPF NONE SEEN 0 - 2 /HPF   Squamous Epithelial / HPF 0-5 < OR = 5 /HPF   Bacteria, UA NONE SEEN NONE SEEN /HPF   Hyaline Cast NONE SEEN NONE SEEN /LPF   Note      Comment: This urine was analyzed for the presence of WBC,  RBC, bacteria, casts, and other formed elements.  Only those elements seen were reported. . .   REFLEXIVE URINE  CULTURE     Status: None   Collection Time: 03/07/24  9:55 AM  Result Value Ref Range   Reflexve Urine Culture      Comment: NO CULTURE INDICATED        Beverley Adine Hummer, MD, MS

## 2024-04-07 NOTE — Patient Instructions (Signed)
  VISIT SUMMARY: You came in today for a follow-up on your diabetes management. We discussed your recent switch to Ozempic , which has helped improve your blood sugar levels. You mentioned that the initial nausea from the medication is becoming more tolerable. We also talked about your upcoming move to [facility] and your need for FMLA to attend medical appointments.  YOUR PLAN: -TYPE 2 DIABETES MELLITUS: Type 2 diabetes is a condition where your body does not use insulin  properly, leading to high blood sugar levels. Your blood sugar levels have improved with the recent switch to Ozempic . We will increase your Ozempic  dose to 1 mg daily to further improve your blood sugar control. We will also start you on empagliflozin  (Jardiance ) 25 mg daily, pending kidney function tests, and pioglitazone  30 mg daily. Your mealtime insulin  (Aspart) will be reduced to 10 units per meal, but you should continue your long-acting insulin  (Glargine). A prescription for ondansetron  (Zofran ) will be provided to help manage any nausea.  INSTRUCTIONS: Please follow up with kidney function tests before starting empagliflozin  (Jardiance ). Continue monitoring your blood sugar levels with your Libre device and adjust your insulin  as discussed. Make sure to take your medications as prescribed and report any side effects. Schedule an appointment for follow-up after your move to [place].

## 2024-04-07 NOTE — Progress Notes (Signed)
 Chief Complaint  Patient presents with   Diabetes    DFC nail trim.   No calluses noted  A1c 11.1 no anti coag.  Pt is interested in orthotics    HPI: 38 y.o. female presents today for diabetic footcare.  Presenting with painful, thickened, watery toenails that she has difficulty maintaining herself due to their thickness and length.  They are painful with shoe gear.  Last A1c was 11.  Previously it was 13.  Recently started Ozempic .  Has been taking gabapentin  300 mg which has been helpful for neuropathy pain.  Also complains of pain to both feet especially during workdays where she is on her feet for long hours.  Past Medical History:  Diagnosis Date   Asthma    COVID-19 07/04/2020   05/04/20   Diabetes mellitus without complication (HCC)    DKA (diabetic ketoacidoses) 08/27/2018   Family history of diabetes mellitus in mother 01/10/2018   Fibroid    pedunculated posterior RUQ near fundus. approx 3-4cm at time of 01/2016 c-section   GBS (group B streptococcus) infection    Hearing loss    pt states she was tested in high school and told it was selective she says she continues to have a deficit and does not feel it is selective   Herpes    Herpes simplex vulvovaginitis 09/06/2020   Hypertension    Newly diagnosed diabetes (HCC) 01/10/2018   Diagnosed 01/10/18  A1c 7.5%   Sickle cell trait (HCC) 09/01/2014   [ ]  ucx q trimester   Trichomonas vaginitis     Past Surgical History:  Procedure Laterality Date   CESAREAN SECTION N/A 01/08/2016   Procedure: CESAREAN SECTION;  Surgeon: Bebe Furry, MD;  Location: [facility] BIRTHING SUITES;  Service: Obstetrics;  Laterality: N/A;   CESAREAN SECTION N/A 09/10/2020   Procedure: CESAREAN SECTION;  Surgeon: Barbra Lang PARAS, DO;  Location: [facility] LD ORS;  Service: Obstetrics;  Laterality: N/A;   DILATION AND CURETTAGE OF UTERUS     TUBAL LIGATION  09/10/2020   Procedure: BILATERAL TUBAL LIGATION;  Surgeon: Barbra Lang PARAS, DO;  Location: [facility] LD ORS;   Service: Obstetrics;;   WISDOM TOOTH EXTRACTION      No Known Allergies  ROS    Physical Exam: There were no vitals filed for this visit.  General: The patient is alert and oriented x3 in no acute distress.  Dermatology: Dry pedal skin.  Interspaces clear of maceration.  Nail plates x 10 are thickened, [place], dystrophic with yellow discoloration somewhat debris.  Tender on direct palpation x 10.  Vascular: Palpable pedal pulses bilaterally. Capillary refill within normal limits.  No appreciable edema.  No erythema or calor.  Neurological: Light touch sensation grossly intact bilateral feet.  Protective sensation intact.  Vibratory sensation diminished.  Subjective paresthesias present.  Musculoskeletal Exam: Muscle strength 5/5 all major muscle groups.  Reducible, flexible pes planus foot type noted.  Some tenderness along plantar medial arches at navicular tuberosity and PT tendon insertion.   Assessment/Plan of Care: 1. Pain due to onychomycosis of toenails of both feet   2. Type 2 diabetes mellitus with diabetic polyneuropathy, with long-term current use of insulin  (HCC)   3. Pes planus of both feet      Meds ordered this encounter  Medications   gabapentin  (NEURONTIN ) 300 MG capsule    Sig: Take 1 capsule (300 mg total) by mouth 3 (three) times daily.    Dispense:  90 capsule  Refill:  3   None  Discussed clinical findings with patient today.  #Onychomycosis with pain  -Nails palliatively debrided as below. -Educated on self-care  Procedure: Nail Debridement Rationale: Pain Type of Debridement: manual, sharp debridement. Instrumentation: Nail nipper, rotary burr. Number of Nails: 10  # Diabetes with neuropathy Patient educated on diabetes. Discussed proper diabetic foot care and discussed risks and complications of disease. Educated patient in depth on reasons to return to the office immediately should he/she discover anything concerning or new on the feet.  All questions answered. Discussed proper shoes as well.  - Refill gabapentin  300 mg 3 times daily sent to patient's pharmacy - Emphasized importance of glucose control, she is starting Ozempic  -I certify that this diagnosis represents a distinct and separate diagnosis that requires evaluation and treatment separate from other procedures or diagnosis   # Bilateral pes planus -Clinical findings reviewed with patient.  Discussed importance of good supportive shoe gear. -Recommend use of good-quality over-the-counter inserts.  May benefit from custom orthotics in the long-term if she gets benefit from this. -Power steps were dispensed to the patient today to be worn with good supportive shoes.  Discussed ramp up period  with patient  Reevaluate in 3 months for diabetic footcare or sooner if new pedal complaints arise.  Kentrail Shew L. Lamount MAUL, AACFAS [facility]     [address] [place], [place] [address]                Office [phone]  Fax [phone]

## 2024-04-09 ENCOUNTER — Other Ambulatory Visit (HOSPITAL_COMMUNITY): Payer: Self-pay

## 2024-04-09 ENCOUNTER — Encounter (HOSPITAL_COMMUNITY): Payer: Self-pay

## 2024-04-10 ENCOUNTER — Other Ambulatory Visit (HOSPITAL_COMMUNITY): Payer: Self-pay

## 2024-04-10 ENCOUNTER — Telehealth: Payer: Self-pay

## 2024-04-10 NOTE — Telephone Encounter (Signed)
 Pharmacy Patient Advocate Encounter   Received notification from CoverMyMeds that prior authorization for Jardiance  25MG  tablets  is required/requested.   Insurance verification completed.   The patient is insured through [place] MEDICAID .   Per test claim: PA required; PA submitted to above mentioned insurance via Latent Key/confirmation #/EOC [id number] Status is pending

## 2024-04-11 ENCOUNTER — Telehealth: Payer: Self-pay

## 2024-04-11 ENCOUNTER — Telehealth (HOSPITAL_COMMUNITY): Admitting: Physician Assistant

## 2024-04-11 ENCOUNTER — Telehealth (INDEPENDENT_AMBULATORY_CARE_PROVIDER_SITE_OTHER): Admitting: Physician Assistant

## 2024-04-11 ENCOUNTER — Other Ambulatory Visit (HOSPITAL_COMMUNITY): Payer: Self-pay

## 2024-04-11 DIAGNOSIS — F331 Major depressive disorder, recurrent, moderate: Secondary | ICD-10-CM

## 2024-04-11 DIAGNOSIS — F419 Anxiety disorder, unspecified: Secondary | ICD-10-CM | POA: Diagnosis not present

## 2024-04-11 MED ORDER — ESCITALOPRAM OXALATE 10 MG PO TABS
10.0000 mg | ORAL_TABLET | Freq: Every day | ORAL | 0 refills | Status: AC
Start: 2024-04-11 — End: ?

## 2024-04-11 NOTE — Telephone Encounter (Signed)
 Pharmacy Patient Advocate Encounter   Received notification from Pt Calls Messages that prior authorization for Ozempic  (1 MG/DOSE) 4MG /3ML pen-injectors is required/requested.   Insurance verification completed.   The patient is insured through [facility] MEDICAID .   Per test claim: PA required; PA submitted to above mentioned insurance via Latent Key/confirmation #/EOC [id number] Status is pending

## 2024-04-11 NOTE — Telephone Encounter (Signed)
 Can we start/submit prior authorization for Semaglutide , 1 MG/DOSE, 4 MG/3ML

## 2024-04-11 NOTE — Progress Notes (Signed)
 [facility] MD/PA/NP OP Progress Note  Virtual Visit via Video Note  I connected with [name] on 04/11/24 at  2:30 PM EDT by a video enabled telemedicine application and verified that I am speaking with the correct person using two identifiers.  Location: Patient: Home Provider: Clinic   I discussed the limitations of evaluation and management by telemedicine and the availability of in person appointments. The patient expressed understanding and agreed to proceed.  Follow Up Instructions:  I discussed the assessment and treatment plan with the patient. The patient was provided an opportunity to ask questions and all were answered. The patient agreed with the plan and demonstrated an understanding of the instructions.   The patient was advised to call back or seek an in-person evaluation if the symptoms worsen or if the condition fails to improve as anticipated.  I provided 12 minutes of non-face-to-face time during this encounter.  [name], PA    04/11/2024 2:30 PM [name]  MRN:  [id number]  Chief Complaint:  Chief Complaint  Patient presents with   Follow-up   Medication Management   HPI:   [name]. Rudden is a 38 year old, African-American female with a past psychiatric history significant for major depressive disorder and anxiety who presents to [facility] via virtual video visit for follow-up and medication management.  Patient is currently being managed on the following medication: Escitalopram  10 mg daily  Patient reports that she is currently under the weather; however, her use of Lexapro  has been going well.  Patient denies experiencing any adverse side effects from the use of her Lexapro .  Patient denies overt depressive symptoms and states that her anxiety has been manageable.  Patient reports that she may experience anxiety once in a while.  Patient endorses the following stressors: housing and  preparing to move to [facility].  A PHQ-9 screen was performed with the patient scoring a 1.  A GAD-7 screen was also performed with the patient scoring a 10.  Patient is alert and oriented x 4, calm, cooperative, and fully engaged in conversation during the encounter.  Patient reports that she is sleepy but doing okay.  Patient exhibits euthymic mood with appropriate affect.  Patient denies suicidal or homicidal ideations.  She further denies auditory or visual hallucinations and does not appear to be responding to internal/external stimuli.  Patient endorses fair sleep and receives on average 4 to 6 hours of sleep per night.  Patient endorses good appetite and eats on average 3 meals per day.  Patient endorses alcohol consumption sparingly.  Patient denies tobacco use or illicit drug use.  Visit Diagnosis:    ICD-10-CM   1. Moderate episode of recurrent major depressive disorder (HCC)  F33.1 escitalopram  (LEXAPRO ) 10 MG tablet    2. Anxiety disorder, unspecified type  F41.9 escitalopram  (LEXAPRO ) 10 MG tablet     Past Psychiatric History:  Patient endorses a past history depression and anxiety   Past Medical History:  Past Medical History:  Diagnosis Date   Asthma    COVID-19 07/04/2020   05/04/20   Diabetes mellitus without complication (HCC)    DKA (diabetic ketoacidoses) 08/27/2018   Family history of diabetes mellitus in mother 01/10/2018   Fibroid    pedunculated posterior RUQ near fundus. approx 3-4cm at time of 01/2016 c-section   GBS (group B streptococcus) infection    Hearing loss    pt states she was tested in high school and told it was  selective she says she continues to have a deficit and does not feel it is selective   Herpes    Herpes simplex vulvovaginitis 09/06/2020   Hypertension    Newly diagnosed diabetes (HCC) 01/10/2018   Diagnosed 01/10/18  A1c 7.5%   Sickle cell trait (HCC) 09/01/2014   [ ]  ucx q trimester   Trichomonas vaginitis     Past Surgical History:   Procedure Laterality Date   CESAREAN SECTION N/A 01/08/2016   Procedure: CESAREAN SECTION;  Surgeon: Bebe Furry, MD;  Location: [facility];  Service: Obstetrics;  Laterality: N/A;   CESAREAN SECTION N/A 09/10/2020   Procedure: CESAREAN SECTION;  Surgeon: Barbra Lang PARAS, DO;  Location: [facility] LD ORS;  Service: Obstetrics;  Laterality: N/A;   DILATION AND CURETTAGE OF UTERUS     TUBAL LIGATION  09/10/2020   Procedure: BILATERAL TUBAL LIGATION;  Surgeon: Barbra Lang PARAS, DO;  Location: [facility] LD ORS;  Service: Obstetrics;;   WISDOM TOOTH EXTRACTION      Family Psychiatric History:  Mother - patient reports that her mother states that she has everything in the book in regards to her mental health Sister - history of suicide and depression Brother (deceased) - depression   Family history of suicide attempt: Patient reports that her mother and sister have attempted suicide Family history of homicide attempt: Patient denies a family history of homicide attempts Family history of substance abuse: Patient reports that her mother abused drugs in the past  Family History:  Family History  Problem Relation Age of Onset   Diabetes Mother    Hypertension Mother    Hyperlipidemia Mother    Heart disease Mother    Breast cancer Mother        62   Ovarian cancer Mother        Unknown age   Diabetes Sister    Diabetes Brother    Breast cancer Cousin    Heart disease Maternal Grandmother     Social History:  Social History   Socioeconomic History   Marital status: Married    Spouse name: Not on file   Number of children: Not on file   Years of education: Not on file   Highest education level: Some college, no degree  Occupational History   Not on file  Tobacco Use   Smoking status: Not on file    Passive exposure: Never   Smokeless tobacco: Former   Tobacco comments:    quit 2013 cigarettes, I vape every once in a blue moon.        Pt does not smoke or vape   Vaping Use    Vaping status: Former  Substance and Sexual Activity   Alcohol use: No   Drug use: No   Sexual activity: Yes    Birth control/protection: None  Other Topics Concern   Not on file  Social History Narrative   Not on file   Social Drivers of Health   Financial Resource Strain: High Risk (04/07/2024)   Overall Financial Resource Strain (CARDIA)    Difficulty of Paying Living Expenses: Very hard  Food Insecurity: Food Insecurity Present (04/07/2024)   Hunger Vital Sign    Worried About Running Out of Food in the Last Year: Often true    Ran Out of Food in the Last Year: Often true  Transportation Needs: No Transportation Needs (04/07/2024)   PRAPARE - Administrator, Civil Service (Medical): No    Lack of Transportation (Non-Medical):  No  Physical Activity: Sufficiently Active (04/07/2024)   Exercise Vital Sign    Days of Exercise per Week: 4 days    Minutes of Exercise per Session: 150+ min  Stress: Stress Concern Present (04/07/2024)   [employer] of Occupational Health - Occupational Stress Questionnaire    Feeling of Stress: To some extent  Social Connections: Moderately Integrated (04/07/2024)   Social Connection and Isolation Panel    Frequency of Communication with Friends and Family: Twice a week    Frequency of Social Gatherings with Friends and Family: Never    Attends Religious Services: More than 4 times per year    Active Member of [employer] or Organizations: Yes    Attends Engineer, structural: More than 4 times per year    Marital Status: Married    Allergies: No Known Allergies  Metabolic Disorder Labs: Lab Results  Component Value Date   HGBA1C 11.1 (A) 03/07/2024   MPG  09/25/2022     Comment:     eAG cannot be calculated. Hemoglobin A1c result exceeds the linearity of the assay. . HbA1c performed on Roche platform. Effective 05/11/22 a change in test platforms may have  shifted HbA1c results compared to historical results.    MPG 162.81  09/10/2020   No results found for: PROLACTIN Lab Results  Component Value Date   CHOL 205 (H) 03/07/2024   TRIG 125.0 03/07/2024   HDL 49.00 03/07/2024   CHOLHDL 4 03/07/2024   VLDL 25.0 03/07/2024   LDLCALC 131 (H) 03/07/2024   LDLCALC 114 (H) 09/25/2022   Lab Results  Component Value Date   TSH 1.44 03/07/2024   TSH 1.13 09/25/2022    Therapeutic Level Labs: No results found for: LITHIUM No results found for: VALPROATE No results found for: CBMZ  Current Medications: Current Outpatient Medications  Medication Sig Dispense Refill   Accu-Chek Softclix Lancets lancets Use to check blood sugar 3 times a day 300 each 5   albuterol  (VENTOLIN  HFA) 108 (90 Base) MCG/ACT inhaler Inhale 2 puffs into the lungs every 6 (six) hours as needed for wheezing or shortness of breath. 18 g 5   atomoxetine  (STRATTERA ) 40 MG capsule Take 1 capsule (40 mg total) by mouth daily. 30 capsule 1   atorvastatin  (LIPITOR) 40 MG tablet Take 1 tablet (40 mg total) by mouth daily. 90 tablet 3   Blood Glucose Monitoring Suppl (ACCU-CHEK GUIDE) w/Device KIT Use to check blood sugar 3 times a day 1 kit 0   Continuous Glucose Sensor (FREESTYLE LIBRE 3 PLUS SENSOR) MISC Inject 1 sensor into the skin for continuous blood glucose monitoring, change every 15 days. 6 each 3   cyclobenzaprine  (FLEXERIL ) 10 MG tablet Take 1 tablet (10 mg total) by mouth 2 (two) times daily as needed for muscle spasms. 20 tablet 1   diclofenac  Sodium (VOLTAREN ) 1 % GEL Apply 2 g topically 4 (four) times daily. 100 g 0   empagliflozin  (JARDIANCE ) 25 MG TABS tablet Take 1 tablet (25 mg total) by mouth daily. 90 tablet 3   escitalopram  (LEXAPRO ) 10 MG tablet Take 1 tablet (10 mg total) by mouth daily. 90 tablet 0   Fluticasone -Umeclidin-Vilant (TRELEGY ELLIPTA ) 100-62.5-25 MCG/ACT AEPB Inhale 1 puff into the lungs daily. 60 each 11   gabapentin  (NEURONTIN ) 300 MG capsule Take 1 capsule (300 mg total) by mouth 3 (three) times daily.  90 capsule 3   glucose blood (ACCU-CHEK GUIDE TEST) test strip Use to check blood sugar 3 times a  day 300 each 5   Insulin  Aspart FlexPen (NOVOLOG ) 100 UNIT/ML Inject 15 Units into the skin. 22-24 units     insulin  aspart protamine- aspart (NOVOLOG  MIX 70/30) (70-30) 100 UNIT/ML injection Inject into the skin.     insulin  glargine (LANTUS ) 100 UNIT/ML injection Inject 45 Units into the skin at bedtime. (Patient taking differently: Inject 50 Units into the skin at bedtime.)     Insulin  Pen Needle (AUM MINI INSULIN  PEN NEEDLE) 32G X 6 MM MISC Use with Lantus  nightly and NPH twice daily 100 each 11   naproxen  (NAPROSYN ) 500 MG tablet Take 1 tablet (500 mg total) by mouth 2 (two) times daily. 20 tablet 1   ondansetron  (ZOFRAN -ODT) 4 MG disintegrating tablet Take 1 tablet (4 mg total) by mouth every 8 (eight) hours as needed for nausea or vomiting. 20 tablet 0   pioglitazone  (ACTOS ) 30 MG tablet Take 1 tablet (30 mg total) by mouth daily. 90 tablet 3   Semaglutide , 1 MG/DOSE, 4 MG/3ML SOPN Inject 1 mg as directed once a week. 9 mL 0   valACYclovir  (VALTREX ) 1000 MG tablet Take 1 tablet (1,000 mg total) by mouth daily for 5 days 5 tablet 2   No current facility-administered medications for this visit.     Musculoskeletal: Strength & Muscle Tone: within normal limits Gait & Station: normal Patient leans: N/A  Psychiatric Specialty Exam: Review of Systems  Psychiatric/Behavioral:  Positive for sleep disturbance. Negative for decreased concentration, dysphoric mood, hallucinations, self-injury and suicidal ideas. The patient is not nervous/anxious and is not hyperactive.     Last menstrual period 04/07/2024.There is no height or weight on file to calculate BMI.  General Appearance: Casual  Eye Contact:  Good  Speech:  Clear and Coherent and Normal Rate  Volume:  Normal  Mood:  Euthymic  Affect:  Appropriate  Thought Process:  Coherent and Descriptions of Associations: Intact  Orientation:   Full (Time, Place, and Person)  Thought Content: WDL   Suicidal Thoughts:  No  Homicidal Thoughts:  No  Memory:  Immediate;   Good Recent;   Good Remote;   Good  Judgement:  Good  Insight:  Good  Psychomotor Activity:  Normal  Concentration:  Concentration: Good and Attention Span: Good  Recall:  Good  Fund of Knowledge: Good  Language: Good  Akathisia:  No  Handed:  Right  AIMS (if indicated): not done  Assets:  Communication Skills Desire for Improvement Social Support  ADL's:  Intact  Cognition: WNL  Sleep:  Fair   Screenings: GAD-7    Flowsheet Row Video Visit from 04/11/2024 in [facility] Office Visit from 03/07/2024 in [name] [employer] Video Visit from 02/28/2024 in [facility] Office Visit from 01/24/2024 in [facility] Video Visit from 12/22/2023 in [facility]  Total GAD-7 Score 10 20 14 10 18    PHQ2-9    Flowsheet Row Video Visit from 04/11/2024 in [facility] Office Visit from 03/07/2024 in [facility] Video Visit from 02/28/2024 in [facility] Office Visit from 01/24/2024 in [facility] Video Visit from 12/22/2023 in [facility]  PHQ-2 Total Score 1 5 4 2 5   PHQ-9 Total Score -- 20 13 13 17    Flowsheet Row Video Visit from 04/11/2024 in [facility]  Center Video Visit from 02/28/2024 in [facility] ED from 02/09/2024 in [facility] Emergency Department at [facility]  C-SSRS RISK CATEGORY No Risk No Risk No Risk     Assessment and Plan:   [name] is a 38 year old, African-American female with a past psychiatric history significant for major depressive disorder and anxiety who presents  to [facility] via virtual video visit for follow-up and medication management.  Patient presents to the encounter stating that she has been taking her medications regularly and denies experiencing any adverse side effects at this time.  Patient denies overt depressive symptoms and states that her anxiety is manageable at this time.  A PHQ-9 screening was performed with the patient scoring a 1.  A GAD-7 screen was also performed with the patient scoring a 10.  Patient endorses stability on her current medication regimen and would like to continue taking her escitalopram  as prescribed.  Patient informed provider that she will be moving to [facility] (outside of [facility]) and will need a 24-month supply of her escitalopram .  Provider to provide patient with a 11-month supply of escitalopram .  Patient's medication to be e-prescribed to pharmacy of choice.  A [place] Suicide Severity Rating Scale was performed with the patient being considered no risk.  Patient denies suicidal ideations and is able to contract for safety at this time.  Collaboration of Care: Collaboration of Care: Medication Management AEB provider managing patient's psychiatric medications, Psychiatrist AEB patient being seen by mental health provider at this facility, and Referral or follow-up with counselor/therapist AEB patient being seen by a licensed clinical social worker at this facility  Patient/Guardian was advised Release of Information must be obtained prior to any record release in order to collaborate their care with an outside provider. Patient/Guardian was advised if they have not already done so to contact the registration department to sign all necessary forms in order for us  to release information regarding their care.   Consent: Patient/Guardian gives verbal consent for treatment and assignment of benefits for services provided during this visit. Patient/Guardian expressed  understanding and agreed to proceed.   1. Moderate episode of recurrent major depressive disorder (HCC)  - escitalopram  (LEXAPRO ) 10 MG tablet; Take 1 tablet (10 mg total) by mouth daily.  Dispense: 90 tablet; Refill: 0  2. Anxiety disorder, unspecified type  - escitalopram  (LEXAPRO ) 10 MG tablet; Take 1 tablet (10 mg total) by mouth daily.  Dispense: 90 tablet; Refill: 0  Patient to be discharged from this facility due to patient moving outside of [name] Provider spent a total of 12 minutes with the patient/reviewing patient's chart  [name], PA 04/11/2024 2:30 PM

## 2024-04-11 NOTE — Telephone Encounter (Signed)
 PA request has been Submitted. New Encounter has been or will be created for follow up. For additional info see Pharmacy Prior Auth telephone encounter from 04/11/24.

## 2024-04-12 ENCOUNTER — Other Ambulatory Visit (HOSPITAL_COMMUNITY): Payer: Self-pay

## 2024-04-12 ENCOUNTER — Encounter (HOSPITAL_COMMUNITY): Payer: Self-pay | Admitting: Physician Assistant

## 2024-04-12 ENCOUNTER — Other Ambulatory Visit: Payer: Self-pay

## 2024-04-12 NOTE — Telephone Encounter (Signed)
 Pharmacy Patient Advocate Encounter  Received notification from [facility] MEDICAID that Prior Authorization for Jardiance  25MG  tablets  has been APPROVED from 04/11/2024 to 04/11/2025   PA #/Case ID/Reference #: [id number]  PLEASE BE ADVISED I HAVE CONTACTED PHARMACY AND THEY HAVE PROCESSED AND WILL LET THE PT KNOW WHEN IT IS READY

## 2024-04-12 NOTE — Telephone Encounter (Signed)
 Pharmacy Patient Advocate Encounter  Received notification from [facility] MEDICAID that Prior Authorization for Ozempic  (1 MG/DOSE) 4MG /3ML pen-injectors  has been APPROVED from 04/11/24 to 04/11/25   PA #/Case ID/Reference #: [id number]

## 2024-04-13 ENCOUNTER — Other Ambulatory Visit (HOSPITAL_COMMUNITY): Payer: Self-pay

## 2024-04-17 ENCOUNTER — Other Ambulatory Visit (HOSPITAL_COMMUNITY): Payer: Self-pay

## 2024-04-17 ENCOUNTER — Other Ambulatory Visit: Payer: Self-pay

## 2024-04-17 DIAGNOSIS — E1165 Type 2 diabetes mellitus with hyperglycemia: Secondary | ICD-10-CM

## 2024-04-17 MED ORDER — FREESTYLE LIBRE 3 PLUS SENSOR MISC
1.0000 | 3 refills | Status: DC
  Filled 2024-04-17: qty 2, 30d supply, fill #0

## 2024-04-17 NOTE — Progress Notes (Unsigned)
   04/18/2024 Name: [name] MRN: [id number] DOB: 02-20-1986  Outreach attempt for scheduled telephone follow-up visit was not successful, but I was able to leave HIPAA compliant voicemail with my direct phone number.  I am also sending a MyChart message to attempt to reschedule visit.  [name], PharmD, DPLA

## 2024-04-18 ENCOUNTER — Other Ambulatory Visit: Payer: Self-pay

## 2024-04-21 ENCOUNTER — Ambulatory Visit: Payer: Self-pay | Admitting: Family Medicine

## 2024-04-21 ENCOUNTER — Ambulatory Visit (INDEPENDENT_AMBULATORY_CARE_PROVIDER_SITE_OTHER): Payer: MEDICAID | Admitting: Family Medicine

## 2024-04-21 ENCOUNTER — Other Ambulatory Visit (HOSPITAL_COMMUNITY): Payer: Self-pay

## 2024-04-21 VITALS — BP 127/83 | HR 81 | Temp 97.0°F | Resp 18 | Wt 247.6 lb

## 2024-04-21 DIAGNOSIS — E66812 Obesity, class 2: Secondary | ICD-10-CM | POA: Diagnosis not present

## 2024-04-21 DIAGNOSIS — Z794 Long term (current) use of insulin: Secondary | ICD-10-CM | POA: Diagnosis not present

## 2024-04-21 DIAGNOSIS — E1165 Type 2 diabetes mellitus with hyperglycemia: Secondary | ICD-10-CM

## 2024-04-21 DIAGNOSIS — Z6837 Body mass index (BMI) 37.0-37.9, adult: Secondary | ICD-10-CM

## 2024-04-21 LAB — COMPREHENSIVE METABOLIC PANEL WITH GFR
ALT: 11 U/L (ref 0–35)
AST: 13 U/L (ref 0–37)
Albumin: 4.4 g/dL (ref 3.5–5.2)
Alkaline Phosphatase: 91 U/L (ref 39–117)
BUN: 6 mg/dL (ref 6–23)
CO2: 27 meq/L (ref 19–32)
Calcium: 9.5 mg/dL (ref 8.4–10.5)
Chloride: 102 meq/L (ref 96–112)
Creatinine, Ser: 0.81 mg/dL (ref 0.40–1.20)
GFR: 92.85 mL/min (ref >60.00)
Glucose, Bld: 113 mg/dL — ABNORMAL HIGH (ref 70–99)
Potassium: 3.9 meq/L (ref 3.5–5.1)
Sodium: 136 meq/L (ref 135–145)
Total Bilirubin: 0.3 mg/dL (ref 0.2–1.2)
Total Protein: 7.8 g/dL (ref 6.0–8.3)

## 2024-04-21 MED ORDER — SEMAGLUTIDE (1 MG/DOSE) 4 MG/3ML ~~LOC~~ SOPN
1.0000 mg | PEN_INJECTOR | SUBCUTANEOUS | 3 refills | Status: AC
  Filled 2024-04-21 – 2024-04-24 (×2): qty 9, 84d supply, fill #0

## 2024-04-21 MED ORDER — PIOGLITAZONE HCL 30 MG PO TABS
30.0000 mg | ORAL_TABLET | Freq: Every day | ORAL | 3 refills | Status: AC
  Filled 2024-04-21 – 2024-04-24 (×2): qty 90, 90d supply, fill #0

## 2024-04-21 MED ORDER — ONDANSETRON 4 MG PO TBDP
4.0000 mg | ORAL_TABLET | Freq: Three times a day (TID) | ORAL | 0 refills | Status: AC | PRN
  Filled 2024-04-21 – 2024-04-24 (×2): qty 20, 7d supply, fill #0

## 2024-04-21 MED ORDER — FREESTYLE LIBRE 3 PLUS SENSOR MISC
1.0000 | 3 refills | Status: AC
  Filled 2024-04-21 – 2024-04-24 (×2): qty 6, 90d supply, fill #0

## 2024-04-21 MED ORDER — EMPAGLIFLOZIN 25 MG PO TABS
25.0000 mg | ORAL_TABLET | Freq: Every day | ORAL | 3 refills | Status: AC
  Filled 2024-04-21 – 2024-04-24 (×2): qty 90, 90d supply, fill #0

## 2024-04-21 NOTE — Telephone Encounter (Signed)
 Rx resolved and sent by Dr. Sebastian MD on OV 04/21/2024 90 day supply

## 2024-04-21 NOTE — Progress Notes (Signed)
 Assessment & Plan   Assessment/Plan:   Assessment & Plan Type 2 diabetes mellitus Type 2 diabetes mellitus previously uncontrolled, now showing significant improvement with current medication regimen. Blood glucose levels are mostly within target range with occasional highs and lows. She has discontinued insulin  use and is managing diabetes with oral medications and semaglutide . Weight loss of 8 pounds noted since increasing semaglutide . No significant adverse effects reported, except for mild nausea managed with tablets. - Continue semaglutide  1 mg weekly. - Continue empagliflozin  25 mg daily. - Continue pioglitazone  30 mg - Discontinue insulin  therapy. - Order metabolic panel to check renal function and electrolytes. - Ensure continuous glucose monitor [facility]) supply is adequate for 90 days.  Overweight Weight loss of 8 pounds achieved since increasing semaglutide . She is actively managing diet and increasing physical activity, contributing to weight loss and improved diabetes control.  Nausea Mild nausea associated with semaglutide  use, managed effectively with zofran  tablets. - Continue current management with ondansetron  as needed.      Medications Discontinued During This Encounter  Medication Reason   Insulin  Aspart FlexPen (NOVOLOG ) 100 UNIT/ML    insulin  aspart protamine- aspart (NOVOLOG  MIX 70/30) (70-30) 100 UNIT/ML injection    insulin  glargine (LANTUS ) 100 UNIT/ML injection    Insulin  Pen Needle (AUM MINI INSULIN  PEN NEEDLE) 32G X 6 MM MISC    glucose blood (ACCU-CHEK GUIDE TEST) test strip    Semaglutide , 1 MG/DOSE, 4 MG/3ML SOPN Reorder   empagliflozin  (JARDIANCE ) 25 MG TABS tablet Reorder   pioglitazone  (ACTOS ) 30 MG tablet Reorder   ondansetron  (ZOFRAN -ODT) 4 MG disintegrating tablet Reorder   Continuous Glucose Sensor (FREESTYLE LIBRE 3 PLUS SENSOR) MISC Reorder   Blood Glucose Monitoring Suppl (ACCU-CHEK GUIDE) w/Device KIT     No follow-ups on file.         Subjective:   Encounter date: 04/21/2024  [name] is a 38 y.o. female who has Class 2 severe obesity due to excess calories with serious comorbidity and body mass index (BMI) of 37.0 to 37.9 in adult [facility]); BMI 40.0-44.9, adult (HCC); Asthma, mild intermittent; Paresthesia of both feet; Other headache syndrome; Anxiety; Type 2 diabetes mellitus without complication, without long-term current use of insulin  (HCC); Chronic hypertension affecting pregnancy; Uncontrolled diabetes mellitus; Proteinuria affecting pregnancy; Alpha thalassemia silent carrier; Vitamin D  deficiency; Essential hypertension; Moderate episode of recurrent major depressive disorder (HCC); Relationship problem between partners; Homeless family; 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   She  has a past medical history of Asthma, COVID-19 (07/04/2020), Diabetes mellitus without complication (HCC), DKA (diabetic ketoacidoses) (08/27/2018), Family history of diabetes mellitus in mother (01/10/2018), Fibroid, GBS (group B streptococcus) infection, Hearing loss, Herpes, Herpes simplex vulvovaginitis (09/06/2020), Hypertension, Newly diagnosed diabetes (HCC) (01/10/2018), Sickle cell trait (HCC) (09/01/2014), and Trichomonas vaginitis..   She presents with chief complaint of Diabetes (2 week follow up. Pt is fasting today//HM due- diabetic eye exam ) and medication managment  (Pt would like to discuss medication management with insulin ) .   Discussed the use of AI scribe software for clinical note transcription with the patient, who gave verbal consent to proceed.  History of Present Illness [name] is a 38 year  old female with diabetes who presents for follow-up on diabetes management.  Glycemic control - Diabetes management follow-up after recent medication adjustments two weeks ago: started pioglitazone  30 mg daily, increased semaglutide  to 1 mg weekly, and started empagliflozin  25 mg daily - Insulin  Aspart (10 units before each meal) and Lantus  insulin  glargine (50 units once daily) prescribed, but not used recently - Currently using only semaglutide  (Ozempic ) 1 mg weekly - Significant improvement in blood glucose control, with levels described as 'amazing' and mostly within target range - Occasional hyperglycemia and hypoglycemia, with lows typically around 60-65 mg/dL - Weight loss of 8 pounds since increasing semaglutide  dose - Decreased appetite attributed to medication, sometimes resulting in hypoglycemia when meals are missed  Adverse effects of antidiabetic therapy - Occasional nausea managed with previously provided tablets - No other adverse effects from current medication regimen - Able to recognize symptoms of hypoglycemia and self-treat promptly  Lifestyle and medication adherence - Active lifestyle, walking approximately two miles daily at work - Careful monitoring of diet and physical activity contributing to improved glycemic control - Preparing for a move and concerned about maintaining medication regimen during transition - Coordinating prescriptions to ensure continuity of care after relocation     ROS  Past Surgical History:  Procedure Laterality Date   CESAREAN SECTION N/A 01/08/2016   Procedure: CESAREAN SECTION;  Surgeon: Bebe Furry, MD;  Location: [facility] BIRTHING SUITES;  Service: Obstetrics;  Laterality: N/A;   CESAREAN SECTION N/A 09/10/2020   Procedure: CESAREAN SECTION;  Surgeon: Barbra Lang PARAS, DO;  Location: [facility] LD ORS;  Service: Obstetrics;  Laterality: N/A;   DILATION AND CURETTAGE OF UTERUS     TUBAL LIGATION  09/10/2020   Procedure: BILATERAL TUBAL  LIGATION;  Surgeon: Barbra Lang PARAS, DO;  Location: [facility] LD ORS;  Service: Obstetrics;;   WISDOM TOOTH EXTRACTION      Outpatient Medications Prior to Visit  Medication Sig Dispense Refill   Accu-Chek Softclix Lancets lancets Use to check blood sugar 3 times a day 300 each 5   albuterol  (VENTOLIN  HFA) 108 (90 Base) MCG/ACT inhaler Inhale 2 puffs into the lungs every 6 (six) hours as needed for wheezing or shortness of breath. 18 g 5   atomoxetine  (STRATTERA ) 40 MG capsule Take 1 capsule (40 mg total) by mouth daily. 30 capsule 1   atorvastatin  (LIPITOR) 40 MG tablet Take 1 tablet (40 mg total) by mouth daily. 90 tablet 3   cyclobenzaprine  (FLEXERIL ) 10 MG tablet Take 1 tablet (10 mg total) by mouth 2 (two) times daily as needed for muscle spasms. 20 tablet 1   diclofenac  Sodium (VOLTAREN ) 1 % GEL Apply 2 g topically 4 (four) times daily. 100 g 0   escitalopram  (LEXAPRO ) 10 MG tablet Take 1 tablet (10 mg total) by mouth daily. 90 tablet 0   Fluticasone -Umeclidin-Vilant (TRELEGY ELLIPTA ) 100-62.5-25 MCG/ACT AEPB Inhale 1 puff into the lungs daily. 60 each 11   gabapentin  (NEURONTIN ) 300 MG capsule Take 1 capsule (300 mg total) by mouth 3 (three) times daily. 90 capsule 3   naproxen  (NAPROSYN ) 500 MG tablet Take 1 tablet (500 mg total) by mouth 2 (two) times daily. 20 tablet 1   valACYclovir  (VALTREX ) 1000 MG tablet Take 1 tablet (1,000 mg total) by mouth daily for 5 days 5 tablet 2   Blood Glucose Monitoring Suppl (ACCU-CHEK GUIDE) w/Device KIT Use to check blood sugar 3 times a day 1 kit 0   Continuous Glucose Sensor (FREESTYLE LIBRE 3 PLUS SENSOR) MISC Inject 1 sensor into the skin for continuous blood glucose monitoring,  change every 15 days. 6 each 3   empagliflozin  (JARDIANCE ) 25 MG TABS tablet Take 1 tablet (25 mg total) by mouth daily. 90 tablet 3   glucose blood (ACCU-CHEK GUIDE TEST) test strip Use to check blood sugar 3 times a day 300 each 5   Insulin  Aspart FlexPen (NOVOLOG ) 100 UNIT/ML  Inject 15 Units into the skin. 22-24 units     insulin  aspart protamine- aspart (NOVOLOG  MIX 70/30) (70-30) 100 UNIT/ML injection Inject into the skin.     insulin  glargine (LANTUS ) 100 UNIT/ML injection Inject 45 Units into the skin at bedtime. (Patient taking differently: Inject 50 Units into the skin at bedtime.)     Insulin  Pen Needle (AUM MINI INSULIN  PEN NEEDLE) 32G X 6 MM MISC Use with Lantus  nightly and NPH twice daily 100 each 11   ondansetron  (ZOFRAN -ODT) 4 MG disintegrating tablet Take 1 tablet (4 mg total) by mouth every 8 (eight) hours as needed for nausea or vomiting. 20 tablet 0   pioglitazone  (ACTOS ) 30 MG tablet Take 1 tablet (30 mg total) by mouth daily. 90 tablet 3   Semaglutide , 1 MG/DOSE, 4 MG/3ML SOPN Inject 1 mg as directed once a week. 9 mL 0   No facility-administered medications prior to visit.    Family History  Problem Relation Age of Onset   Diabetes Mother    Hypertension Mother    Hyperlipidemia Mother    Heart disease Mother    Breast cancer Mother        8   Ovarian cancer Mother        Unknown age   Diabetes Sister    Diabetes Brother    Breast cancer Cousin    Heart disease Maternal Grandmother     Social History   Socioeconomic History   Marital status: Married    Spouse name: Not on file   Number of children: Not on file   Years of education: Not on file   Highest education level: Some college, no degree  Occupational History   Not on file  Tobacco Use   Smoking status: Not on file    Passive exposure: Never   Smokeless tobacco: Former   Tobacco comments:    quit 2013 cigarettes, I vape every once in a blue moon.        Pt does not smoke or vape   Vaping Use   Vaping status: Former  Substance and Sexual Activity   Alcohol use: No   Drug use: No   Sexual activity: Yes    Birth control/protection: None  Other Topics Concern   Not on file  Social History Narrative   Not on file   Social Drivers of Health   Financial  Resource Strain: High Risk (04/07/2024)   Overall Financial Resource Strain (CARDIA)    Difficulty of Paying Living Expenses: Very hard  Food Insecurity: Food Insecurity Present (04/07/2024)   Hunger Vital Sign    Worried About Running Out of Food in the Last Year: Often true    Ran Out of Food in the Last Year: Often true  Transportation Needs: No Transportation Needs (04/07/2024)   PRAPARE - Administrator, Civil Service (Medical): No    Lack of Transportation (Non-Medical): No  Physical Activity: Sufficiently Active (04/07/2024)   Exercise Vital Sign    Days of Exercise per Week: 4 days    Minutes of Exercise per Session: 150+ min  Stress: Stress Concern Present (04/07/2024)   [employer] of  Occupational Health - Occupational Stress Questionnaire    Feeling of Stress: To some extent  Social Connections: Moderately Integrated (04/07/2024)   Social Connection and Isolation Panel    Frequency of Communication with Friends and Family: Twice a week    Frequency of Social Gatherings with Friends and Family: Never    Attends Religious Services: More than 4 times per year    Active Member of [employer] or Organizations: Yes    Attends Engineer, structural: More than 4 times per year    Marital Status: Married  Catering manager Violence: Not At Risk (03/01/2024)   Humiliation, Afraid, Rape, and Kick questionnaire    Fear of Current or Ex-Partner: No    Emotionally Abused: No    Physically Abused: No    Sexually Abused: No                                                                                                  Objective:  Physical Exam: BP 127/83 (BP Location: Left Arm, Patient Position: Sitting, Cuff Size: Large)   Pulse 81   Temp (!) 97 F (36.1 C) (Temporal)   Resp 18   Wt 247 lb 9.6 oz (112.3 kg)   LMP 04/07/2024 (Exact Date)   SpO2 100%   BMI 37.65 kg/m   Wt Readings from Last 3 Encounters:  04/21/24 247 lb 9.6 oz (112.3 kg)  04/07/24 255 lb 6.4 oz  (115.8 kg)  03/07/24 258 lb 12.8 oz (117.4 kg)    Physical Exam GENERAL: Alert, cooperative, well developed, no acute distress HEENT: Normocephalic, normal oropharynx, moist mucous membranes CHEST: Clear to auscultation bilaterally, No wheezes, rhonchi, or crackles CARDIOVASCULAR: Normal heart rate and rhythm, S1 and S2 normal without murmurs ABDOMEN: Soft, non-tender, non-distended, without organomegaly, Normal bowel sounds EXTREMITIES: No cyanosis or edema NEUROLOGICAL: Cranial nerves grossly intact, Moves all extremities without gross motor or sensory deficit   Physical Exam  DG Hand Complete Right Result Date: 02/09/2024 CLINICAL DATA:  Right hand pain.  No trauma. EXAM: RIGHT HAND - COMPLETE 3+ VIEW COMPARISON:  None Available. FINDINGS: Right hand appears intact. No evidence of acute fracture or subluxation. No focal bone lesion or bone destruction. Bone cortex and trabecular architecture appear intact. No radiopaque soft tissue foreign bodies. IMPRESSION: No acute bony abnormalities. Electronically Signed   By: Elsie Gravely M.D.   On: 02/09/2024 19:11    Recent Results (from the past 2160 hours)  CBG monitoring, ED     Status: Abnormal   Collection Time: 02/09/24  9:20 PM  Result Value Ref Range   Glucose-Capillary 224 (H) 70 - 99 mg/dL    Comment: Glucose reference range applies only to samples taken after fasting for at least 8 hours.  POCT glycosylated hemoglobin (Hb A1C)     Status: Abnormal   Collection Time: 03/07/24  8:59 AM  Result Value Ref Range   Hemoglobin A1C 11.1 (A) 4.0 - 5.6 %   HbA1c POC (<> result, manual entry)     HbA1c, POC (prediabetic range)     HbA1c, POC (controlled diabetic range)  TSH     Status: None   Collection Time: 03/07/24  9:55 AM  Result Value Ref Range   TSH 1.44 0.35 - 5.50 uIU/mL  Lipid panel     Status: Abnormal   Collection Time: 03/07/24  9:55 AM  Result Value Ref Range   Cholesterol 205 (H) 0 - 200 mg/dL    Comment: ATP  III Classification       Desirable:  < 200 mg/dL               Borderline High:  200 - 239 mg/dL          High:  > = 759 mg/dL   Triglycerides 874.9 0.0 - 149.0 mg/dL    Comment: Normal:  <849 mg/dLBorderline High:  150 - 199 mg/dL   HDL 50.99 >60.99 mg/dL   VLDL 74.9 0.0 - 59.9 mg/dL   LDL Cholesterol 868 (H) 0 - 99 mg/dL   Total CHOL/HDL Ratio 4     Comment:                Men          Women1/2 Average Risk     3.4          3.3Average Risk          5.0          4.42X Average Risk          9.6          7.13X Average Risk          15.0          11.0                       NonHDL 155.67     Comment: NOTE:  Non-HDL goal should be 30 mg/dL higher than patient's LDL goal (i.e. LDL goal of < 70 mg/dL, would have non-HDL goal of < 100 mg/dL)  Microalbumin / creatinine urine ratio     Status: None   Collection Time: 03/07/24  9:55 AM  Result Value Ref Range   Microalb, Ur <0.7 mg/dL   Creatinine,U 56.8 mg/dL   Microalb Creat Ratio Unable to calculate 0.0 - 30.0 mg/g    Comment: Unable to Calculate due to Microalbumin Result of <0.7 mg/dL  CBC with Differential/Platelet     Status: Abnormal   Collection Time: 03/07/24  9:55 AM  Result Value Ref Range   WBC 5.3 4.0 - 10.5 K/uL   RBC 4.99 3.87 - 5.11 Mil/uL   Hemoglobin 10.8 (L) 12.0 - 15.0 g/dL   HCT 65.5 (L) 63.9 - 53.9 %   MCV 68.9 Repeated and verified X2. (L) 78.0 - 100.0 fl   MCHC 31.5 30.0 - 36.0 g/dL   RDW 81.7 (H) 88.4 - 84.4 %   Platelets 348.0 150.0 - 400.0 K/uL   Neutrophils Relative % 59.8 43.0 - 77.0 %   Lymphocytes Relative 33.1 12.0 - 46.0 %   Monocytes Relative 5.7 3.0 - 12.0 %   Eosinophils Relative 1.1 0.0 - 5.0 %   Basophils Relative 0.3 0.0 - 3.0 %   Neutro Abs 3.2 1.4 - 7.7 K/uL   Lymphs Abs 1.8 0.7 - 4.0 K/uL   Monocytes Absolute 0.3 0.1 - 1.0 K/uL   Eosinophils Absolute 0.1 0.0 - 0.7 K/uL   Basophils Absolute 0.0 0.0 - 0.1 K/uL  Comprehensive metabolic panel with GFR     Status: Abnormal   Collection Time:  03/07/24   9:55 AM  Result Value Ref Range   Sodium 134 (L) 135 - 145 mEq/L   Potassium 3.9 3.5 - 5.1 mEq/L   Chloride 99 96 - 112 mEq/L   CO2 29 19 - 32 mEq/L   Glucose, Bld 303 (H) 70 - 99 mg/dL   BUN 8 6 - 23 mg/dL   Creatinine, Ser 9.22 0.40 - 1.20 mg/dL   Total Bilirubin 0.3 0.2 - 1.2 mg/dL   Alkaline Phosphatase 92 39 - 117 U/L   AST 10 0 - 37 U/L   ALT 10 0 - 35 U/L   Total Protein 7.4 6.0 - 8.3 g/dL   Albumin 4.1 3.5 - 5.2 g/dL   GFR 01.24 >39.99 mL/min    Comment: Calculated using the CKD-EPI Creatinine Equation (2021)   Calcium  9.2 8.4 - 10.5 mg/dL  Urinalysis w microscopic + reflex cultur     Status: Abnormal   Collection Time: 03/07/24  9:55 AM   Specimen: Blood  Result Value Ref Range   Color, Urine YELLOW YELLOW   APPearance CLEAR CLEAR   Specific Gravity, Urine 1.022 1.001 - 1.035   pH 6.0 5.0 - 8.0   Glucose, UA 3+ (A) NEGATIVE   Bilirubin Urine NEGATIVE NEGATIVE   Ketones, ur NEGATIVE NEGATIVE   Hgb urine dipstick TRACE (A) NEGATIVE   Protein, ur NEGATIVE NEGATIVE   Nitrites, Initial NEGATIVE NEGATIVE   Leukocyte Esterase NEGATIVE NEGATIVE   WBC, UA 0-5 0 - 5 /HPF   RBC / HPF NONE SEEN 0 - 2 /HPF   Squamous Epithelial / HPF 0-5 < OR = 5 /HPF   Bacteria, UA NONE SEEN NONE SEEN /HPF   Hyaline Cast NONE SEEN NONE SEEN /LPF   Note      Comment: This urine was analyzed for the presence of WBC,  RBC, bacteria, casts, and other formed elements.  Only those elements seen were reported. . .   REFLEXIVE URINE CULTURE     Status: None   Collection Time: 03/07/24  9:55 AM  Result Value Ref Range   Reflexve Urine Culture      Comment: NO CULTURE INDICATED        Beverley Adine Hummer, MD, MS

## 2024-04-21 NOTE — Patient Instructions (Signed)
  VISIT SUMMARY: You had a follow-up visit to discuss your diabetes management. Your blood glucose levels have significantly improved, and you have lost 8 pounds since your last visit. We discussed your current medications and made plans to ensure continuity of care during your upcoming move.  YOUR PLAN: TYPE 2 DIABETES MELLITUS: Your diabetes is now much better controlled with your current medications. Your blood glucose levels are mostly within the target range, and you have lost weight. -Continue taking semaglutide  (Ozempic ) 1 mg weekly. -Continue taking empagliflozin  25 mg daily. -You can stop using insulin . -We will order a metabolic panel to check your kidney function and electrolytes. -Make sure you have enough continuous glucose monitor (Libre) supplies for the next 90 days.  WEIGHT LOSS: You have lost 8 pounds since increasing your semaglutide  dose. Your active lifestyle and careful diet are helping with weight loss and better diabetes control! -Keep up with your current diet and physical activity.

## 2024-04-24 ENCOUNTER — Other Ambulatory Visit (HOSPITAL_COMMUNITY): Payer: Self-pay

## 2024-04-24 ENCOUNTER — Other Ambulatory Visit: Payer: Self-pay

## 2024-05-03 ENCOUNTER — Other Ambulatory Visit (HOSPITAL_COMMUNITY): Payer: Self-pay

## 2024-05-10 ENCOUNTER — Other Ambulatory Visit (HOSPITAL_COMMUNITY): Payer: Self-pay

## 2024-05-16 ENCOUNTER — Telehealth: Payer: Self-pay | Admitting: Pharmacy Technician

## 2024-05-16 NOTE — Progress Notes (Signed)
   05/16/2024  Patient ID: [name], female   DOB: 08-Mar-1986, 38 y.o.   MRN: [id number]  Patient engaged with clinical pharmacist for management of diabetes on 03/23/2024. Outreach by [employer] technician was requested.   Outreached patient to discuss diabetes medication management. Left voicemail for patient to return my call at their convenience.    Terald Jump, CPhT [facility] Office: [phone] Email: [email or web address]

## 2024-05-18 ENCOUNTER — Telehealth: Payer: Self-pay | Admitting: Pharmacy Technician

## 2024-05-18 NOTE — Progress Notes (Signed)
   05/18/2024  Patient ID: [name], female   DOB: 1985/12/15, 38 y.o.   MRN: [id number]  Patient engaged with clinical pharmacist for management of diabetes on 03/23/2024. Outreach by [employer] technician was requested.   Outreached patient to discuss diabetes medication management.Voicemail unable to be left as voicemail box is not set up.  [name], CPhT [facility] Office: [phone] Email: [name]

## 2024-05-22 ENCOUNTER — Telehealth: Payer: Self-pay | Admitting: Pharmacy Technician

## 2024-05-22 ENCOUNTER — Other Ambulatory Visit (HOSPITAL_COMMUNITY): Payer: Self-pay

## 2024-05-22 NOTE — Progress Notes (Signed)
   05/22/2024 Name: [name] MRN: [id number] DOB: [birth year]  Patient is appearing for a follow-up visit with the population health pharmacy technician. Last engaged with the clinical pharmacist to discuss diabetes on 03/23/2024. Contacted patient today to discuss diabetes.   Plan from last clinical pharmacist appointment:  Diabetes: -Currently uncontrolled -Reviewed goal A1c, goal fasting, and goal 2 hour post prandial glucose -Continue current regimen at this time -I am coordinating with [facility] for refills on Libre 3+ sensors- it appears patient has primary insurance, which will only cover a 1 month supply at $40 copay; but she also has a Medicaid plan that should cover these -Sees PCP again 9/5, and I recommend increasing Ozempic  to 1mg  weekly if patient continues to tolerate well -Due for A1c again November  Hyperlipidemia/ASCVD Risk Reduction: -Currently uncontrolled.  -Continue current regimen at this time -I recommend follow-up CMP and Lipid panel in November  Follow Up Plan: 1 month  Medication Adherence Barriers Identified:  Contact made with patient today Patient has relocated to [place]  Patient made recommended medication changes per plan: Yes Patient informs she has stopped insulin . She informs she is currently taking Ozempic  1mg  weekly, Jardiance  25mg  daily and Pioglitazone  30mg  daily. Access issues with any new medication or testing device: No Patient informs she has some medication on hand. She was attempting to get some friends/family to pick up her medications in [place] and mail them to her in [place]  but was not having much success. Patient was encouraged to go to local pharmacy in her area and inquire if they could transfer the refills form [place] to [place]. Inquired if patient had established care in [place]. Patient informs she had an office visit on Friday 05/22/24 with a new provider. Patient also encouraged to have  new PCP to call in refills  for patient.  Per Dr Annemarie fill history data, 90 tablets of Jardiance  and Pioglitazone  were filled on 04/13/24. Ozempic  was filled on 9/11 with 3ml and 30 tablets of Atrovastatin 40mg  were also filled. FreeStyle Libre sensors were last filled on 04/21/2024 for 2 sensors.  Patient is checking blood sugars as prescribed: Yes Patient uses FreeStyle [place] and the following 7 day data was reported. Date of Download: 05/22/2024 Average Glucose: 142 mg/dL Time in Goal:  - Time in range 70-180: 87% - Time above range: 13%  Medication Adherence Barriers Addressed/Actions Taken:  Reviewed medication changes per plan from last clinical pharmacist note Will discuss medication access concerns with pharmacist Educated patient to contact pharmacy regarding new prescriptions Reviewed instructions for monitoring blood sugars at home and reminded patient to keep a written log to review with pharmacist Reminded patient of date/time of upcoming clinical pharmacist follow up and any upcoming PCP/specialists visits. Patient denies transportation barriers to the appointment. No  Next clinical pharmacist appointment is scheduled for: N/A as patient has moved out of the area to [place] .  Kate Caddy, CPhT [facility] Office: [phone] Email: [email or web address]

## 2024-06-05 ENCOUNTER — Encounter: Payer: Self-pay | Admitting: Radiology
